# Patient Record
Sex: Female | Born: 1935
Health system: Southern US, Community
[De-identification: ages and names within clinical notes are randomized; demographics above are authoritative.]

## PROBLEM LIST (undated history)

## (undated) DIAGNOSIS — H9190 Unspecified hearing loss, unspecified ear: Secondary | ICD-10-CM

## (undated) DIAGNOSIS — K760 Fatty (change of) liver, not elsewhere classified: Secondary | ICD-10-CM

## (undated) DIAGNOSIS — E119 Type 2 diabetes mellitus without complications: Secondary | ICD-10-CM

## (undated) DIAGNOSIS — E785 Hyperlipidemia, unspecified: Secondary | ICD-10-CM

## (undated) DIAGNOSIS — I10 Essential (primary) hypertension: Secondary | ICD-10-CM

## (undated) HISTORY — DX: Essential (primary) hypertension: I10

## (undated) HISTORY — DX: Type 2 diabetes mellitus without complications: E11.9

## (undated) HISTORY — DX: Fatty (change of) liver, not elsewhere classified: K76.0

## (undated) HISTORY — PX: APPENDECTOMY: SHX54

## (undated) HISTORY — DX: Hyperlipidemia, unspecified: E78.5

## (undated) HISTORY — PX: ABDOMINAL HYSTERECTOMY: SHX81

## (undated) HISTORY — PX: COLONOSCOPY: SHX174

---

## 2006-01-27 ENCOUNTER — Encounter (INDEPENDENT_AMBULATORY_CARE_PROVIDER_SITE_OTHER): Payer: Self-pay | Admitting: Internal Medicine

## 2006-01-27 LAB — CONVERTED CEMR LAB
ALT: 20 units/L
AST: 19 units/L
Albumin: 4.6 g/dL
Alkaline Phosphatase: 88 units/L
BUN: 20 mg/dL
Basophils Absolute: 0 10*3/uL
Basophils Relative: 0 %
Bilirubin, Direct: 0.1 mg/dL
CO2: 24 meq/L
Calcium: 9.6 mg/dL
Chloride: 102 meq/L
Cholesterol: 233 mg/dL
Creatinine, Ser: 1 mg/dL
Eosinophils Absolute: 0.2 10*3/uL
Eosinophils Relative: 2 %
Glucose, Bld: 100 mg/dL
HCT: 40.5 %
HDL: 51 mg/dL
Hemoglobin: 13.7 g/dL
LDL Cholesterol: 146 mg/dL
Lymphocytes Relative: 35 %
Lymphs Abs: 3.6 10*3/uL
MCHC: 33.8 g/dL
MCV: 93 fL
Monocytes Absolute: 0.8 10*3/uL
Monocytes Relative: 8 %
Neutro Abs: 5.7 10*3/uL
Neutrophils Relative %: 55 %
Platelets: 261 10*3/uL
Potassium: 4.8 meq/L
RBC: 4.38 M/uL
RDW: 12.7 %
Sodium: 139 meq/L
Total Bilirubin: 0.4 mg/dL
Total Protein: 7.3 g/dL
Triglycerides: 178 mg/dL
VLDL: 36 mg/dL
WBC: 10.3 10*3/uL

## 2006-02-22 ENCOUNTER — Ambulatory Visit: Payer: Self-pay | Admitting: Internal Medicine

## 2006-02-23 ENCOUNTER — Encounter (INDEPENDENT_AMBULATORY_CARE_PROVIDER_SITE_OTHER): Payer: Self-pay | Admitting: Internal Medicine

## 2006-02-23 LAB — CONVERTED CEMR LAB: TSH: 2.634 microintl units/mL

## 2006-06-02 ENCOUNTER — Ambulatory Visit: Payer: Self-pay | Admitting: Internal Medicine

## 2006-08-15 ENCOUNTER — Ambulatory Visit: Payer: Self-pay | Admitting: Internal Medicine

## 2006-08-16 ENCOUNTER — Encounter (INDEPENDENT_AMBULATORY_CARE_PROVIDER_SITE_OTHER): Payer: Self-pay | Admitting: Internal Medicine

## 2006-08-26 ENCOUNTER — Encounter: Payer: Self-pay | Admitting: Internal Medicine

## 2006-08-26 DIAGNOSIS — I10 Essential (primary) hypertension: Secondary | ICD-10-CM | POA: Insufficient documentation

## 2006-08-26 DIAGNOSIS — F329 Major depressive disorder, single episode, unspecified: Secondary | ICD-10-CM | POA: Insufficient documentation

## 2006-08-26 DIAGNOSIS — M5430 Sciatica, unspecified side: Secondary | ICD-10-CM | POA: Insufficient documentation

## 2006-08-26 DIAGNOSIS — E1169 Type 2 diabetes mellitus with other specified complication: Secondary | ICD-10-CM | POA: Insufficient documentation

## 2006-08-26 DIAGNOSIS — H269 Unspecified cataract: Secondary | ICD-10-CM | POA: Insufficient documentation

## 2006-08-26 DIAGNOSIS — G43909 Migraine, unspecified, not intractable, without status migrainosus: Secondary | ICD-10-CM | POA: Insufficient documentation

## 2006-08-26 DIAGNOSIS — E785 Hyperlipidemia, unspecified: Secondary | ICD-10-CM | POA: Insufficient documentation

## 2006-09-12 ENCOUNTER — Ambulatory Visit (HOSPITAL_COMMUNITY): Admission: RE | Admit: 2006-09-12 | Discharge: 2006-09-12 | Payer: Self-pay | Admitting: Ophthalmology

## 2006-11-24 ENCOUNTER — Encounter (INDEPENDENT_AMBULATORY_CARE_PROVIDER_SITE_OTHER): Payer: Self-pay | Admitting: Internal Medicine

## 2006-11-25 ENCOUNTER — Ambulatory Visit: Payer: Self-pay | Admitting: Internal Medicine

## 2007-06-05 ENCOUNTER — Ambulatory Visit: Payer: Self-pay | Admitting: Internal Medicine

## 2007-06-08 ENCOUNTER — Encounter (INDEPENDENT_AMBULATORY_CARE_PROVIDER_SITE_OTHER): Payer: Self-pay | Admitting: Internal Medicine

## 2007-06-09 ENCOUNTER — Encounter (INDEPENDENT_AMBULATORY_CARE_PROVIDER_SITE_OTHER): Payer: Self-pay | Admitting: Internal Medicine

## 2007-06-12 LAB — CONVERTED CEMR LAB
BUN: 19 mg/dL (ref 6–23)
CO2: 29 meq/L (ref 19–32)
Calcium: 9 mg/dL (ref 8.4–10.5)
Chloride: 105 meq/L (ref 96–112)
Cholesterol: 199 mg/dL (ref 0–200)
Creatinine, Ser: 0.99 mg/dL (ref 0.40–1.20)
Glucose, Bld: 124 mg/dL — ABNORMAL HIGH (ref 70–99)
HDL: 47 mg/dL (ref >39)
LDL Cholesterol: 123 mg/dL — ABNORMAL HIGH (ref 0–99)
Potassium: 5.2 meq/L (ref 3.5–5.3)
Sodium: 143 meq/L (ref 135–145)
Total CHOL/HDL Ratio: 4.2
Triglycerides: 143 mg/dL (ref <150)
VLDL: 29 mg/dL (ref 0–40)

## 2007-06-13 ENCOUNTER — Telehealth (INDEPENDENT_AMBULATORY_CARE_PROVIDER_SITE_OTHER): Payer: Self-pay | Admitting: *Deleted

## 2007-06-20 ENCOUNTER — Ambulatory Visit (HOSPITAL_COMMUNITY): Admission: RE | Admit: 2007-06-20 | Discharge: 2007-06-20 | Payer: Self-pay | Admitting: Ophthalmology

## 2007-09-14 ENCOUNTER — Ambulatory Visit: Payer: Self-pay | Admitting: Internal Medicine

## 2007-09-14 DIAGNOSIS — G4700 Insomnia, unspecified: Secondary | ICD-10-CM

## 2007-09-14 DIAGNOSIS — F5104 Psychophysiologic insomnia: Secondary | ICD-10-CM | POA: Insufficient documentation

## 2007-09-14 DIAGNOSIS — E739 Lactose intolerance, unspecified: Secondary | ICD-10-CM | POA: Insufficient documentation

## 2007-09-14 DIAGNOSIS — R5381 Other malaise: Secondary | ICD-10-CM | POA: Insufficient documentation

## 2007-09-14 DIAGNOSIS — R5383 Other fatigue: Secondary | ICD-10-CM

## 2007-10-27 ENCOUNTER — Telehealth (INDEPENDENT_AMBULATORY_CARE_PROVIDER_SITE_OTHER): Payer: Self-pay | Admitting: *Deleted

## 2008-01-22 ENCOUNTER — Encounter (INDEPENDENT_AMBULATORY_CARE_PROVIDER_SITE_OTHER): Payer: Self-pay | Admitting: Internal Medicine

## 2008-03-29 ENCOUNTER — Ambulatory Visit: Payer: Self-pay | Admitting: Internal Medicine

## 2008-03-29 DIAGNOSIS — M1990 Unspecified osteoarthritis, unspecified site: Secondary | ICD-10-CM | POA: Insufficient documentation

## 2008-04-01 LAB — CONVERTED CEMR LAB
ALT: 27 units/L (ref 0–35)
AST: 20 units/L (ref 0–37)
Albumin: 4.2 g/dL (ref 3.5–5.2)
Alkaline Phosphatase: 75 units/L (ref 39–117)
BUN: 21 mg/dL (ref 6–23)
Basophils Absolute: 0 10*3/uL (ref 0.0–0.1)
Basophils Relative: 0 % (ref 0–1)
CO2: 27 meq/L (ref 19–32)
CRP: 0.2 mg/dL (ref <0.6)
Calcium: 9.6 mg/dL (ref 8.4–10.5)
Chloride: 104 meq/L (ref 96–112)
Cholesterol: 206 mg/dL — ABNORMAL HIGH (ref 0–200)
Creatinine, Ser: 0.95 mg/dL (ref 0.40–1.20)
Eosinophils Absolute: 0.2 10*3/uL (ref 0.0–0.7)
Eosinophils Relative: 2 % (ref 0–5)
Glucose, Bld: 112 mg/dL — ABNORMAL HIGH (ref 70–99)
HCT: 42.7 % (ref 36.0–46.0)
HDL: 46 mg/dL (ref >39)
Hemoglobin: 13.8 g/dL (ref 12.0–15.0)
LDL Cholesterol: 124 mg/dL — ABNORMAL HIGH (ref 0–99)
Lymphocytes Relative: 30 % (ref 12–46)
Lymphs Abs: 3 10*3/uL (ref 0.7–4.0)
MCHC: 32.3 g/dL (ref 30.0–36.0)
MCV: 97.9 fL (ref 78.0–100.0)
Monocytes Absolute: 0.9 10*3/uL (ref 0.1–1.0)
Monocytes Relative: 9 % (ref 3–12)
Neutro Abs: 6 10*3/uL (ref 1.7–7.7)
Neutrophils Relative %: 59 % (ref 43–77)
Platelets: 242 10*3/uL (ref 150–400)
Potassium: 5.1 meq/L (ref 3.5–5.3)
RBC: 4.36 M/uL (ref 3.87–5.11)
RDW: 12.9 % (ref 11.5–15.5)
Sodium: 140 meq/L (ref 135–145)
Total Bilirubin: 0.3 mg/dL (ref 0.3–1.2)
Total CHOL/HDL Ratio: 4.5
Total Protein: 7 g/dL (ref 6.0–8.3)
Triglycerides: 179 mg/dL — ABNORMAL HIGH (ref <150)
VLDL: 36 mg/dL (ref 0–40)
WBC: 10.2 10*3/uL (ref 4.0–10.5)

## 2008-05-23 ENCOUNTER — Ambulatory Visit: Payer: Self-pay | Admitting: Internal Medicine

## 2008-05-31 ENCOUNTER — Telehealth (INDEPENDENT_AMBULATORY_CARE_PROVIDER_SITE_OTHER): Payer: Self-pay | Admitting: Internal Medicine

## 2008-08-15 ENCOUNTER — Ambulatory Visit: Payer: Self-pay | Admitting: Internal Medicine

## 2008-08-28 ENCOUNTER — Telehealth (INDEPENDENT_AMBULATORY_CARE_PROVIDER_SITE_OTHER): Payer: Self-pay | Admitting: *Deleted

## 2008-12-11 ENCOUNTER — Encounter (INDEPENDENT_AMBULATORY_CARE_PROVIDER_SITE_OTHER): Payer: Self-pay | Admitting: Internal Medicine

## 2009-02-17 ENCOUNTER — Ambulatory Visit: Payer: Self-pay | Admitting: Internal Medicine

## 2009-02-17 DIAGNOSIS — T148XXA Other injury of unspecified body region, initial encounter: Secondary | ICD-10-CM | POA: Insufficient documentation

## 2009-02-17 DIAGNOSIS — M810 Age-related osteoporosis without current pathological fracture: Secondary | ICD-10-CM | POA: Insufficient documentation

## 2009-02-19 ENCOUNTER — Ambulatory Visit (HOSPITAL_COMMUNITY): Admission: RE | Admit: 2009-02-19 | Discharge: 2009-02-19 | Payer: Self-pay | Admitting: Internal Medicine

## 2009-02-19 ENCOUNTER — Encounter (INDEPENDENT_AMBULATORY_CARE_PROVIDER_SITE_OTHER): Payer: Self-pay | Admitting: Internal Medicine

## 2009-02-26 ENCOUNTER — Encounter (INDEPENDENT_AMBULATORY_CARE_PROVIDER_SITE_OTHER): Payer: Self-pay | Admitting: Internal Medicine

## 2009-02-27 ENCOUNTER — Encounter (INDEPENDENT_AMBULATORY_CARE_PROVIDER_SITE_OTHER): Payer: Self-pay | Admitting: Internal Medicine

## 2009-02-27 DIAGNOSIS — R7301 Impaired fasting glucose: Secondary | ICD-10-CM | POA: Insufficient documentation

## 2009-02-27 LAB — CONVERTED CEMR LAB
ALT: 39 units/L — ABNORMAL HIGH (ref 0–35)
AST: 28 units/L (ref 0–37)
Albumin: 4 g/dL (ref 3.5–5.2)
Alkaline Phosphatase: 76 units/L (ref 39–117)
BUN: 18 mg/dL (ref 6–23)
CO2: 26 meq/L (ref 19–32)
Calcium: 9.3 mg/dL (ref 8.4–10.5)
Chloride: 105 meq/L (ref 96–112)
Cholesterol: 207 mg/dL — ABNORMAL HIGH (ref 0–200)
Creatinine, Ser: 1.09 mg/dL (ref 0.40–1.20)
Glucose, Bld: 135 mg/dL — ABNORMAL HIGH (ref 70–99)
HDL: 44 mg/dL (ref >39)
LDL Cholesterol: 129 mg/dL — ABNORMAL HIGH (ref 0–99)
Potassium: 5 meq/L (ref 3.5–5.3)
Sodium: 143 meq/L (ref 135–145)
Total Bilirubin: 0.5 mg/dL (ref 0.3–1.2)
Total CHOL/HDL Ratio: 4.7
Total Protein: 6.7 g/dL (ref 6.0–8.3)
Triglycerides: 168 mg/dL — ABNORMAL HIGH (ref <150)
VLDL: 34 mg/dL (ref 0–40)

## 2009-03-05 ENCOUNTER — Telehealth (INDEPENDENT_AMBULATORY_CARE_PROVIDER_SITE_OTHER): Payer: Self-pay | Admitting: Internal Medicine

## 2009-04-17 ENCOUNTER — Encounter (INDEPENDENT_AMBULATORY_CARE_PROVIDER_SITE_OTHER): Payer: Self-pay | Admitting: Internal Medicine

## 2010-08-30 ENCOUNTER — Encounter: Payer: Self-pay | Admitting: Internal Medicine

## 2010-09-08 NOTE — Letter (Signed)
Summary: New Patient Screening  New Patient Screening   Imported By: Micah Flesher, LPN 14/78/2956 21:30:86  _____________________________________________________________________  External Attachment:    Type:   Image     Comment:   External Document

## 2010-09-08 NOTE — Assessment & Plan Note (Signed)
Summary: eustacian tube dysfunction/cerumen impaction   Vital Signs:  Patient Profile:   75 Years Old Female Height:     68 inches (172.72 cm) O2 Sat:      98 % Pulse rate:   72 / minute Resp:     8 per minute BP sitting:   136 / 88  (left arm) BP standing:   100 / 64  Pt. in pain?   no  Vitals Entered By: Lutricia Horsfall (November 25, 2006 2:26 PM) Oxygen therapy Room Air                Serial Vital Signs/Assessments:  Time      Position  BP       Pulse  Resp  Temp     By 2:34 PM   Lying RA  114/74                         Lutricia Horsfall 2:34 PM   Sitting   108/82                         Lutricia Horsfall 2:34 PM   Standing  100/64                         Lutricia Horsfall   Chief Complaint:  dizziness.  History of Present Illness: room spinning and lying down and turning head.  For about 3-4 weeks.  No nasal congestion.  +post nasal drip.  No substantial trouble with allergies in past.  Prior Medications: ACCUPRIL 20 MG TABS (QUINAPRIL HCL) two times a day ASPIR-LOW 81 MG TBEC (ASPIRIN) once daily AMBIEN 5 MG TABS (ZOLPIDEM TARTRATE) at bedtime CALCARB 600 1500 MG TABS (CALCIUM CARBONATE) two times a day Current Allergies: No known allergies   Past Medical History:    Reviewed history from 11/24/2006 and no changes required:       Depression       Hyperlipidemia       Hypertension       cataracts       glucose intolerance       migraines       sciatica  Past Surgical History:    Caesarean section    Hysterectomy    L cataract     Review of Systems      See HPI   Physical Exam  General:     Obese, in no acute distress. Alert and oriented X 3.  Ears:     R cerumen impaction and L Cerumen impaction.   Nose:     mild Erythema and swelling with clear drainage.  Mouth:     No eyrthema or exudate.  Neck:     normal carotid upstroke and no carotid bruits.   Lungs:     Clear to auscultation bilaterally with good air movement, normal expansion.   Heart:     Normal S1 and S2. No murmurs or extracardiac sounds. PMI is nondisplaced.  Bilat ears flushed using 50% hydrogen peroxide and 50% warm water in elephant ear wash systemn and ear spatula.  Pt. tolerated procedure well. .................................................................Marland KitchenMarland KitchenLutricia Horsfall  November 25, 2006 3:36 PM    Impression & Recommendations:  Problem # 1:  EUSTACHIAN TUBE DYSFUNCTION (ICD-381.81) WIll try sudal 12 two times a day and see if this helps.  This may be a contributing factor to her dizzines.  Problem # 2:  CERUMEN  IMPACTION, BILATERAL (ICD-380.4) room in impaction may be contributing to her dizziness and her ears were flushed and the cerumen was removed with some difficulty. Orders: Cerumen Impaction Removal ([id number])   Medications Added to Medication List This Visit: 1)  Sudal 12 Tannate 4-30 Mg Chew (Chlorphen mal-pse hcl tannate) .Marland Kitchen.. 1 by mouth two times a day   Patient Instructions: 1)  she will return in two weeks if her dizziness had not has not improved after her.  We flushed her ears and started the Sudal 12.

## 2010-09-08 NOTE — Assessment & Plan Note (Signed)
Summary: HTN/lipids/HCM/Td shot   Vital Signs:  Patient profile:   75 year old female Weight:      247.25 pounds BMI:     37.73 O2 Sat:      96 % on Room air Pulse rate:   96 / minute BP sitting:   120 / 88  (right arm)  Vitals Entered By: Micah Flesher, LPN (February 17, 2009 9:15 AM)  Nutrition Counseling: Patient's BMI is greater than 25 and therefore counseled on weight management options. CC: F/U, cut, needs tetanus   CC:  F/U, cut, and needs tetanus.  History of Present Illness: Patient presents today for routine follow up.  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  She doesn't remember when her last tetanus shot was because it was so long ago.    Her last mammogram was in 2007.  She says she had a colonoscopy about 5 years ago.  She has never had a bone density test.    She is asking about something to supress her appetite and help her lose weight.  She says she is eating at night.  She says her husband goes to bed very early and she just eats.  Allergies: No Known Drug Allergies  Past History:  Past Medical History: Reviewed history from 03/29/2008 and no changes required. Depression Hyperlipidemia Hypertension cataracts glucose intolerance migraines sciatica Osteoarthritis  Past Surgical History: Reviewed history from 11/25/2006 and no changes required. Caesarean section Hysterectomy L cataract  Family History: Reviewed history from 11/24/2006 and no changes required. father-deceased-CVA mother-deceased-lung cancer sister-hyperlipidemia, anxiety brother-deceased-GSW  Social History: Reviewed history from 11/24/2006 and no changes required. Married lives with spouse Never Smoked Alcohol use-no Drug use-no  Review of Systems      See HPI  Physical Exam  General:  Obese, in no acute distress. Alert and oriented X 3.  Lungs:  Clear to  auscultation bilaterally with good air movement, normal expansion.  Heart:  Normal S1 and S2. No murmurs or extracardiac sounds. PMI is nondisplaced.  Extremities:  trace pretibial edema   Impression & Recommendations:  Problem # 1:  HYPERTENSION (ICD-401.9) The patient's blood pressure looks good at this time.  Labs are needed.  Patient should monitor blood pressure occassionally until next appointment.  Her updated medication list for this problem includes:    Accupril 20 Mg Tabs (Quinapril hcl) .Marland Kitchen..Marland Kitchen Two times a day  Orders: T-Comprehensive Metabolic Panel ([id number])  Problem # 2:  HYPERLIPIDEMIA (ICD-272.4) She will return fasting for blood work at her convenience.   Orders: T-Comprehensive Metabolic Panel [id number]) T-Lipid Profile [phone])  Problem # 3:  LACERATION (ICD-879.8)  No infection, Td given.  Orders: Tetanus Toxoid w/Dx ([id number])  Problem # 4:  UNSPECIFIED BREAST SCREENING (ICD-V76.10)  Will refer for mammography.  Orders: Mammogram (Screening) (Mammo)  Complete Medication List: 1)  Accupril 20 Mg Tabs (Quinapril hcl) .... Two times a day 2)  Aspir-low 81 Mg Tbec (Aspirin) .... Once daily 3)  Amitriptyline Hcl 50 Mg Tabs (Amitriptyline hcl) .Marland Kitchen.. 1 by mouth 1-2 hours before bed as needed sleep 4)  Calcarb 600 1500 Mg Tabs (Calcium carbonate) .... Two times a day  Other Orders: Dexa scan (Dexa scan) Admin 1st Vaccine ([id number]) Prescriptions: AMITRIPTYLINE HCL 50 MG TABS (AMITRIPTYLINE HCL) 1 by mouth 1-2 hours before bed as needed sleep  #90 x 1  Entered and Authorized by:   Erle Crocker MD   Signed by:   Erle Crocker MD on 02/17/2009   Method used:   Electronically to        [facility] (mail-order)             ,          Ph: [phone]       Fax: [phone]   RxID:   [id number] ACCUPRIL 20 MG TABS (QUINAPRIL HCL) two times a day  #180 x 1   Entered and Authorized by:   Erle Crocker MD   Signed by:   Erle Crocker MD  on 02/17/2009   Method used:   Electronically to        [facility]* (mail-order)             ,          Ph: [phone]       Fax: [phone]   RxID:   [id number]    Preventive Care Screening  Colonoscopy:    Date:  02/07/2004    Next Due:  02/2014    Results:  normal    Tetanus/Td Vaccine    Vaccine Type: Td    Site: right deltoid    Mfr: Akorn    Dose: 0.5 ml    Route: IM    Given by: Micah Flesher, LPN    Exp. Date: 03/2009    Lot #: [id number]    VIS given: 06/27/07 version given February 17, 2009.   Appended Document: HTN/lipids/HCM/Td shot mammogram appt scheduled on 02/19/09 @10 :00am at [facility] and dexa scan 11:00am at [facility]. patient informed and order faxed.LA

## 2010-09-08 NOTE — Medication Information (Signed)
Summary: quinapril  quinapril   Imported By: Curtis Sites 12/13/2008 10:20:20  _____________________________________________________________________  External Attachment:    Type:   Image     Comment:   External Document

## 2010-09-08 NOTE — Letter (Signed)
Summary: Labs  Labs   Imported By: Micah Flesher, LPN 29/56/2130 86:57:84  _____________________________________________________________________  External Attachment:    Type:   Image     Comment:   External Document

## 2010-09-08 NOTE — Letter (Signed)
Summary: Dr. Consent  Dr. Consent   Imported By: Donneta Romberg 06/08/2007 14:04:57  _____________________________________________________________________  External Attachment:    Type:   Image     Comment:   External Document

## 2010-09-08 NOTE — Assessment & Plan Note (Signed)
Summary: lipids/HTN/left leg pain/fatigue/insomnia/   Vital Signs:  Patient Profile:   75 Years Old Female Height:     68 inches (172.72 cm) O2 Sat:      98 % O2 treatment:    Room Air Pulse rate:   81 / minute Resp:     12 per minute BP sitting:   116 / 86  (left arm)  Vitals Entered By: Lutricia Horsfall (September 14, 2007 9:28 AM)                 Chief Complaint:  followup.  History of Present Illness: Here for routine follow up.  She says she is most concerned about pain in her left leg that has been going on since at least December.  She says her 3 grandsons were here in December.  She says it was very severe in December but has gotten better.  She says she notes it now when she has been very active.  She notes it most of the time at night when she goes to bed at night.  She says the tylenol helps.  She is worried about blood clots.    She says she "does not accept the fatigue is from getting old."  She says she will not have another sleep study to evaluate this.  She says she couldn't sleep and it was too noisy and the sheets were cheap,...  She also says she got a very large bruise after having her blood drawn in November.    She is also asking about an article in the [employer] about CRP testing.  She says she is "doing OK" trying to watch her sugar intake.    She says she is using the [place] only as needed now.  She drings herb tea and takes a [place] herb before bed that helps her sleep most nights.    Current Allergies: No known allergies   Past Medical History:    Reviewed history from 11/24/2006 and no changes required:       Depression       Hyperlipidemia       Hypertension       cataracts       glucose intolerance       migraines       sciatica  Past Surgical History:    Reviewed history from 11/25/2006 and no changes required:       Caesarean section       Hysterectomy       L cataract   Family History:    Reviewed history from 11/24/2006 and no  changes required:       father-deceased-CVA       mother-deceased-lung cancer       sister-hyperlipidemia, anxiety       brother-deceased-GSW  Social History:    Reviewed history from 11/24/2006 and no changes required:       Married lives with spouse       Never Smoked       Alcohol use-no       Drug use-no    Review of Systems      See HPI   Physical Exam  General:     Obese, in no acute distress. Alert and oriented X 3.  Msk:     normal ROM both knees.  There is tenderness to palpation of the lateral joint line of the left knee. Extremities:     No edema, calf tenderness or swelling.     Impression &  Recommendations:  Problem # 1:  HYPERLIPIDEMIA (ICD-272.4) We discussed the [place] trial results and she would not want to take a statin if her CRP is high, so we are opting to not check the CRP.    Problem # 2:  HYPERTENSION (ICD-401.9) The patient's blood pressure looks good at this time.  Labs are not needed until her next appointment.  Patient should monitor blood pressure occassionally until next appointment.  Accupril is refilled today.  Her updated medication list for this problem includes:    Accupril 20 Mg Tabs (Quinapril hcl) .Marland Kitchen..Marland Kitchen Two times a day   Problem # 3:  LEG PAIN, LEFT (ICD-729.5) Her pain appears to be from OA.  We discussed possibly getting an x-ray but she says the tylenol is enough and she will continue with that.  Problem # 4:  FATIGUE (ICD-780.79) I certainly think she would benefit from a sleep study, but she refuses this.    Problem # 5:  INSOMNIA (ICD-780.52) ambien refilled for as needed use.   Her updated medication list for this problem includes:    Ambien 5 Mg Tabs (Zolpidem tartrate) .Marland Kitchen... At bedtime as needed   Problem # 6:  GLUCOSE INTOLERANCE (ICD-271.3) I tried to reinforce to her the importance of diet and exercise in helping normalize her sugars.  Complete Medication List: 1)  Accupril 20 Mg Tabs (Quinapril hcl) ....  Two times a day 2)  Aspir-low 81 Mg Tbec (Aspirin) .... Once daily 3)  Ambien 5 Mg Tabs (Zolpidem tartrate) .... At bedtime as needed 4)  Calcarb 600 1500 Mg Tabs (Calcium carbonate) .... Two times a day 5)  Sudal 12 Tannate 4-30 Mg Chew (Chlorphen mal-pse hcl tannate) .Marland Kitchen.. 1 by mouth two times a day   Patient Instructions: 1)  Please schedule a follow-up appointment in 3 months.    Prescriptions: AMBIEN 5 MG TABS (ZOLPIDEM TARTRATE) at bedtime  #30 x 2   Entered and Authorized by:   Erle Crocker MD   Signed by:   Erle Crocker MD on 09/14/2007   Method used:   Print then Give to Patient   RxID:   [id number] ACCUPRIL 20 MG TABS (QUINAPRIL HCL) two times a day  #30 x 5   Entered and Authorized by:   Erle Crocker MD   Signed by:   Erle Crocker MD on 09/14/2007   Method used:   Print then Give to Patient   RxID:   [id number]  ]

## 2010-09-08 NOTE — Letter (Signed)
Summary: Privacy document  Privacy document   Imported By: Micah Flesher, LPN 50/04/3817 29:93:71  _____________________________________________________________________  External Attachment:    Type:   Image     Comment:   External Document

## 2010-09-08 NOTE — Miscellaneous (Signed)
Summary: order for glucose  Clinical Lists Changes  Problems: Added new problem of IMPAIRED FASTING GLUCOSE (ICD-790.21) Orders: Added new Test order of T-Glucose, Blood [id number]) - Signed

## 2010-09-08 NOTE — Progress Notes (Signed)
Summary: Meds changed to [facility]  Phone Note Call from Patient   Summary of Call: patient states that [facility] should be faxing a request for all her medications. they are going to start using [facility]. if you would please go ahead and send in prescriptions. thanks Initial call taken by: Curtis Sites,  August 28, 2008 2:40 PM      Prescriptions: AMITRIPTYLINE HCL 50 MG TABS (AMITRIPTYLINE HCL) 1 by mouth 1-2 hours before bed as needed sleep  #90 x 1   Entered by:   Sherrie Geophysicist/field seismologist   Authorized by:   Erle Crocker MD   Signed by:   Lutricia Horsfall on 08/28/2008   Method used:   Printed then faxed to ...       [facility] (mail-order)             ,          Ph: [phone]       Fax: [phone]   RxID:   [id number] ACCUPRIL 20 MG TABS (QUINAPRIL HCL) two times a day  #180 x 1   Entered by:   Sherrie Geophysicist/field seismologist   Authorized by:   Erle Crocker MD   Signed by:   Lutricia Horsfall on 08/28/2008   Method used:   Printed then faxed to ...       [facility]* (mail-order)             ,          Ph: [phone]       Fax: [phone]   RxID:   [id number]

## 2010-09-08 NOTE — Letter (Signed)
Summary: Flow Sheet  Flow Sheet   Imported By: Micah Flesher, LPN 04/54/0981 19:14:78  _____________________________________________________________________  External Attachment:    Type:   Image     Comment:   External Document

## 2010-09-08 NOTE — Letter (Signed)
Summary: Mammography  Mammography   Imported By: Micah Flesher, LPN 98/06/9146 82:95:62  _____________________________________________________________________  External Attachment:    Type:   Image     Comment:   External Document

## 2010-09-08 NOTE — Letter (Signed)
Summary: External Document  External Document   Imported By: Micah Flesher, LPN 04/54/0981 19:14:78  _____________________________________________________________________  External Attachment:    Type:   Image     Comment:   External Document

## 2010-09-08 NOTE — Progress Notes (Signed)
Summary: Sleep Medicine  Phone Note Call from Patient   Summary of Call: Pt states that she has tried tha trazodone x3 since her appt in August and that each time she uses it she gets restless legs and would like something else called in for sleep.  She uses [facility]. Initial call taken by: Lutricia Horsfall,  May 31, 2008 10:29 AM  Follow-up for Phone Call        Amitriptylline 50mg  1 by mouth 1-2 hours before bed as needed.  #30, RF 2.  Rx sent to [facility]. Follow-up by: Erle Crocker MD,  May 31, 2008 1:01 PM  Additional Follow-up for Phone Call Additional follow up Details #1::        Left message for patient. Additional Follow-up by: Lutricia Horsfall,  May 31, 2008 3:50 PM    New/Updated Medications: AMITRIPTYLINE HCL 50 MG TABS (AMITRIPTYLINE HCL) 1 by mouth 1-2 hours before bed as needed sleep   Prescriptions: AMITRIPTYLINE HCL 50 MG TABS (AMITRIPTYLINE HCL) 1 by mouth 1-2 hours before bed as needed sleep  #30 x 2   Entered and Authorized by:   Erle Crocker MD   Signed by:   Erle Crocker MD on 05/31/2008   Method used:   Electronically to        [facility] (retail)       [address]       [place], [place]  [address]       Ph: [phone]       Fax: [phone]   RxID:   [id number]

## 2010-09-08 NOTE — Letter (Signed)
Summary: Office Visit  Office Visit   Imported By: Micah Flesher, LPN 95/63/8756 43:32:95  _____________________________________________________________________  External Attachment:    Type:   Image     Comment:   External Document

## 2010-09-08 NOTE — Assessment & Plan Note (Signed)
Summary: HTN/lipids/insomnia/OA/   Vital Signs:  Patient Profile:   75 Years Old Female Height:     68 inches (172.72 cm) O2 Sat:      96 % O2 treatment:    Room Air Pulse rate:   98 / minute Resp:     12 per minute BP sitting:   112 / 84  (left arm)  Vitals Entered By: Lutricia Horsfall (March 29, 2008 8:27 AM)                 Chief Complaint:  followup HTN.  History of Present Illness: Patient presents today for routine follow up.  She is asking for a CBC and CRP with her other labs today.  She says she is aching and it is accelerating.  SHe says she can't sit for more than 1 hour because her back hurts.  She says she can't sleep more than 4-5 hours per night because she wakes up with pain in her legs and back.  She denies exertional leg pain, chest pain or shortness of breath.  She says tylenol helps her pain but she doesn't want to take it because she wants her nightly glass of wine.  She says she needs Ambien 10mg .  Most nights she falls asleep with the 5mg  but she wakes up a few hours later.     She is also asking about vaccinations and booster shots.  She is uncertain if it has been 10 years since her tetanus shot but does have it documented at home.      Current Allergies: No known allergies   Past Medical History:    Reviewed history from 11/24/2006 and no changes required:       Depression       Hyperlipidemia       Hypertension       cataracts       glucose intolerance       migraines       sciatica       Osteoarthritis  Past Surgical History:    Reviewed history from 11/25/2006 and no changes required:       Caesarean section       Hysterectomy       L cataract   Family History:    Reviewed history from 11/24/2006 and no changes required:       father-deceased-CVA       mother-deceased-lung cancer       sister-hyperlipidemia, anxiety       brother-deceased-GSW  Social History:    Reviewed history from 11/24/2006 and no changes required:  Married lives with spouse       Never Smoked       Alcohol use-no       Drug use-no    Review of Systems      See HPI   Physical Exam  General:     Obese, in no acute distress. Alert and oriented X 3.  Lungs:     Clear to auscultation bilaterally with good air movement, normal expansion.  Heart:     Normal S1 and S2. No murmurs or extracardiac sounds. PMI is nondisplaced.  Extremities:     trace left pretibial edema and right pretibial edema.      Impression & Recommendations:  Problem # 1:  HYPERTENSION (ICD-401.9) The patient's blood pressure looks good at this time.  Labs are needed.  Patient should monitor blood pressure occassionally until next appointment.  Her updated medication list for this problem includes:  Accupril 20 Mg Tabs (Quinapril hcl) .Marland Kitchen..Marland Kitchen Two times a day  Orders: T-Comprehensive Metabolic Panel ([id number])   Problem # 2:  HYPERLIPIDEMIA (ICD-272.4) Lipids and liver enzymes are drawn today and adjustments will be made in medications based on the lab results.  Orders: T-Comprehensive Metabolic Panel [id number]) T-Lipid Profile ([id number])   Problem # 3:  INSOMNIA (ICD-780.52) I told her that Remus Loffler is just not a good choice for nightly use.  I also told her that I am uncomfortable with continuing to give her sedative medications without evaluating the underlying etiology especially with a sleep study but she is again unwilling to do that.  We are going to try trazodone 100mg  1 hour before bed.  Problem # 4:  FATIGUE (ICD-780.79) Suspect due to #3 but will check CBC. Orders: T-CBC w/Diff ([id number])   Problem # 5:  PHYSICAL EXAMINATION (ICD-V70.0) She is aware that Medicare might not pay for a CRP, but she wants it.  She is going to check when her last tetanus shot was but I told her Medicare does not pay for tetanus boosters.  I told her that flu shots will be available in Sept/Oct. Orders: T-C-Reactive Protein [id number])    Problem # 6:  OSTEOARTHRITIS (ICD-715.90) I told her that she can safely take tylenol 3-4 times per week and still have her glass of wine nightly. Her updated medication list for this problem includes:    Aspir-low 81 Mg Tbec (Aspirin) ..... Once daily   Complete Medication List: 1)  Accupril 20 Mg Tabs (Quinapril hcl) .... Two times a day 2)  Aspir-low 81 Mg Tbec (Aspirin) .... Once daily 3)  Trazodone Hcl 100 Mg Tabs (Trazodone hcl) .Marland Kitchen.. 1 by mouth 1 hour before bed 4)  Calcarb 600 1500 Mg Tabs (Calcium carbonate) .... Two times a day   Patient Instructions: 1)  The patient will be called with the results when they are available.   Prescriptions: TRAZODONE HCL 100 MG TABS (TRAZODONE HCL) 1 by mouth 1 hour before bed  #30 x 5   Entered and Authorized by:   Erle Crocker MD   Signed by:   Erle Crocker MD on 03/29/2008   Method used:   Electronically to        [facility] (retail)       [address]       [place], [place]  [address]       Ph: [phone]       Fax: [phone]   RxID:   [id number]  ]

## 2010-09-08 NOTE — Progress Notes (Signed)
Summary: 06/09/2007 lab results  Phone Note Outgoing Call   Call placed by: Sonny Dandy,  June 13, 2007 9:18 AM Summary of Call: Results given to patient, voices understanding .................................................................Marland KitchenMarland KitchenSonny Dandy  June 13, 2007 9:18 AM   Initial call taken by: Sonny Dandy,  June 13, 2007 9:18 AM

## 2010-09-08 NOTE — Letter (Signed)
Summary: Medical release  Medical release   Imported By: Micah Flesher, LPN 53/66/4403 47:42:59  _____________________________________________________________________  External Attachment:    Type:   Image     Comment:   External Document

## 2010-09-08 NOTE — Progress Notes (Signed)
Summary: REQUESTING DIET PILLS  Phone Note Call from Patient Call back at Home Phone [phone]   Caller: Patient Call For: Erle Crocker MD Summary of Call: patient said she requested some diet pills at her last visit but no response was given and would like to ask again.  Initial call taken by: Carlye Grippe,  March 05, 2009 12:04 PM  Follow-up for Phone Call        We did discuss the diet pills and I explained to her that they do not work if she doesn't first work on modifying her diet.  If she would like referral to the dietician, that would be the best first step. Follow-up by: Erle Crocker MD,  March 05, 2009 1:00 PM  Additional Follow-up for Phone Call Additional follow up Details #1::        patient informed. Additional Follow-up by: Carlye Grippe,  March 05, 2009 1:06 PM

## 2010-09-08 NOTE — Assessment & Plan Note (Signed)
Summary: H1N1 SHOT  Nurse Visit  H1N1 Given.  Deanna Salinas  August 15, 2008 1:49 PM   Prior Medications: ACCUPRIL 20 MG TABS (QUINAPRIL HCL) two times a day ASPIR-LOW 81 MG TBEC (ASPIRIN) once daily AMITRIPTYLINE HCL 50 MG TABS (AMITRIPTYLINE HCL) 1 by mouth 1-2 hours before bed as needed sleep CALCARB 600 1500 MG TABS (CALCIUM CARBONATE) two times a day Current Allergies: No known allergies     Orders Added: 1)  Influenza A (H1N1) adm  fee Medicare/Non Medicare Shadi.Imam    ]

## 2010-09-08 NOTE — Letter (Signed)
Summary: Mammogram  Mammogram   Imported By: Micah Flesher, LPN 60/45/4098 11:91:47  _____________________________________________________________________  External Attachment:    Type:   Image     Comment:   External Document

## 2010-09-08 NOTE — Letter (Signed)
Summary: Demographics  Demographics   Imported By: Micah Flesher, LPN 16/05/9603 54:09:81  _____________________________________________________________________  External Attachment:    Type:   Image     Comment:   External Document

## 2010-09-08 NOTE — Therapy (Signed)
Summary: ENTconsult  ENTconsult   Imported By: Donneta Romberg 09/23/2006 16:22:22  _____________________________________________________________________  External Attachment:    Type:   Image     Comment:   External Document

## 2010-09-08 NOTE — Medication Information (Signed)
Summary: amitriptyline  amitriptyline   Imported By: Curtis Sites 12/13/2008 10:09:26  _____________________________________________________________________  External Attachment:    Type:   Image     Comment:   External Document

## 2010-09-08 NOTE — Assessment & Plan Note (Signed)
Summary: HTN/lipids/skin tags/flu shot   Vital Signs:  Patient Profile:   75 Years Old Female Height:     68 inches (172.72 cm) Weight:      241 pounds Temp:     97.1 degrees F Pulse rate:   83 / minute Resp:     16 per minute BP sitting:   130 / 90  (right arm)  Pt. in pain?   no  Vitals Entered BySonny Dandy (June 05, 2007 8:55 AM)                  Procedure Note  Skin Tag Removal: Indication: inflamed lesion Consent signed: yes  Procedure # 1: skin tag(s) removed    Region: inferior    Location: left neck    # lesions removed: 3    Instrument used: scissors    Comment: after written informed consent was obtained, the area was cleaned with chlorprep in the usual sterile fashion.  2% lidocaine with epinephrine was used for local anesthetic.   3 skin tags were removed without difficulty.  The patient tolerated the procedure and there were no immediate complications.  Silver nitrate was used to obtain hemostasis on one of the sites.    Chief Complaint:  flu shot / skin tag removal.  History of Present Illness: Deanna Salinas presents today needing her flu shot.  She needs refills of her medication.  She is not fasting.  She has a few irritated skin tags on the left side of her neck that she would like to have removed.  They get caught on her neck line and necklaces.    Current Allergies: No known allergies   Past Medical History:    Reviewed history from 11/24/2006 and no changes required:       Depression       Hyperlipidemia       Hypertension       cataracts       glucose intolerance       migraines       sciatica  Past Surgical History:    Reviewed history from 11/25/2006 and no changes required:       Caesarean section       Hysterectomy       L cataract   Family History:    Reviewed history from 11/24/2006 and no changes required:       father-deceased-CVA       mother-deceased-lung cancer       sister-hyperlipidemia, anxiety  brother-deceased-GSW  Social History:    Reviewed history from 11/24/2006 and no changes required:       Married lives with spouse       Never Smoked       Alcohol use-no       Drug use-no    Review of Systems      See HPI   Physical Exam  General:     Obese, in no acute distress. Alert and oriented X 3.  Skin:     3 small skin tags on left side of neck.    Impression & Recommendations:  Problem # 1:  HYPERTENSION (ICD-401.9) The patient's blood pressure looks OK at this time.  Labs are needed.  Patient should monitor blood pressure occassionally until next appointment.  Her updated medication list for this problem includes:    Accupril 20 Mg Tabs (Quinapril hcl) .Marland Kitchen..Marland Kitchen Two times a day  Orders: T-Basic Metabolic Panel [phone])   Problem # 2:  HYPERLIPIDEMIA (ICD-272.4)  Lipids and liver enzymes are drawn today and we will need to discuss medications if her LDL remains elevated.  Orders: T-Lipid Profile ([id number])   Problem # 3:  SKIN TAG (ICD-701.9)  Orders: Removal of Skin Tags up to 15 Lesions (11200)   Complete Medication List: 1)  Accupril 20 Mg Tabs (Quinapril hcl) .... Two times a day 2)  Aspir-low 81 Mg Tbec (Aspirin) .... Once daily 3)  Ambien 5 Mg Tabs (Zolpidem tartrate) .... At bedtime 4)  Calcarb 600 1500 Mg Tabs (Calcium carbonate) .... Two times a day 5)  Sudal 12 Tannate 4-30 Mg Chew (Chlorphen mal-pse hcl tannate) .Marland Kitchen.. 1 by mouth two times a day  Other Orders: Influenza Vaccine MCR (09811)   Patient Instructions: 1)  The patient will be called with the results when they are available.    Prescriptions: AMBIEN 5 MG TABS (ZOLPIDEM TARTRATE) at bedtime  #30 x 2   Entered and Authorized by:   Erle Crocker MD   Signed by:   Erle Crocker MD on 06/05/2007   Method used:   Electronically sent to ...       [facility]       [address]       [place], [place]  [address]       Ph: [phone]        Fax: [phone]   RxID:   [id number] ACCUPRIL 20 MG TABS (QUINAPRIL HCL) two times a day  #30 x 5   Entered and Authorized by:   Erle Crocker MD   Signed by:   Erle Crocker MD on 06/05/2007   Method used:   Electronically sent to ...       [facility]       [address]       [place], [place]  [address]       Ph: [phone]       Fax: [phone]   RxID:   [id number]  ]  Influenza Vaccine    Vaccine Type: Fluvax MCR    Site: left deltoid    Mfr: norvaris    Dose: 0.5 ml    Route: IM    Given by: Sonny Dandy    Exp. Date: 12/2007    Lot #: [id number]    VIS given: 02/05/05 version given June 05, 2007.  Flu Vaccine Consent Questions    Do you have a history of severe allergic reactions to this vaccine? no    Any prior history of allergic reactions to egg and/or gelatin? no    Do you have a sensitivity to the preservative Thimersol? no    Do you have a past history of Guillan-Barre Syndrome? no    Do you currently have an acute febrile illness? no    Have you ever had a severe reaction to latex? no    Vaccine information given and explained to patient? yes    Are you currently pregnant? no

## 2011-02-03 ENCOUNTER — Ambulatory Visit (HOSPITAL_COMMUNITY)
Admission: RE | Admit: 2011-02-03 | Discharge: 2011-02-03 | Disposition: A | Discharge status: Home / Self Care | Payer: Medicare Other | Source: Ambulatory Visit | Attending: Family Medicine | Admitting: Family Medicine

## 2011-02-03 ENCOUNTER — Other Ambulatory Visit: Payer: Self-pay | Admitting: Family Medicine

## 2011-02-03 DIAGNOSIS — M51379 Other intervertebral disc degeneration, lumbosacral region without mention of lumbar back pain or lower extremity pain: Secondary | ICD-10-CM | POA: Insufficient documentation

## 2011-02-03 DIAGNOSIS — M5430 Sciatica, unspecified side: Secondary | ICD-10-CM

## 2011-02-03 DIAGNOSIS — M545 Low back pain, unspecified: Secondary | ICD-10-CM | POA: Insufficient documentation

## 2011-02-03 DIAGNOSIS — M5137 Other intervertebral disc degeneration, lumbosacral region: Secondary | ICD-10-CM | POA: Insufficient documentation

## 2011-03-05 ENCOUNTER — Other Ambulatory Visit: Payer: Self-pay | Admitting: Family Medicine

## 2011-03-11 ENCOUNTER — Ambulatory Visit (HOSPITAL_COMMUNITY)
Admission: RE | Admit: 2011-03-11 | Discharge: 2011-03-11 | Disposition: A | Discharge status: Home / Self Care | Payer: Medicare Other | Source: Ambulatory Visit | Attending: Family Medicine | Admitting: Family Medicine

## 2011-03-12 ENCOUNTER — Ambulatory Visit (HOSPITAL_COMMUNITY)
Admission: RE | Admit: 2011-03-12 | Discharge: 2011-03-12 | Disposition: A | Discharge status: Home / Self Care | Payer: Medicare Other | Source: Ambulatory Visit | Attending: Family Medicine | Admitting: Family Medicine

## 2011-03-12 DIAGNOSIS — Q619 Cystic kidney disease, unspecified: Secondary | ICD-10-CM | POA: Insufficient documentation

## 2011-03-12 DIAGNOSIS — R1031 Right lower quadrant pain: Secondary | ICD-10-CM | POA: Insufficient documentation

## 2011-11-22 DIAGNOSIS — Z79899 Other long term (current) drug therapy: Secondary | ICD-10-CM | POA: Diagnosis not present

## 2011-11-22 DIAGNOSIS — I10 Essential (primary) hypertension: Secondary | ICD-10-CM | POA: Diagnosis not present

## 2011-11-22 DIAGNOSIS — R7301 Impaired fasting glucose: Secondary | ICD-10-CM | POA: Diagnosis not present

## 2011-11-30 DIAGNOSIS — I10 Essential (primary) hypertension: Secondary | ICD-10-CM | POA: Diagnosis not present

## 2012-05-01 DIAGNOSIS — Z23 Encounter for immunization: Secondary | ICD-10-CM | POA: Diagnosis not present

## 2012-05-10 DIAGNOSIS — E119 Type 2 diabetes mellitus without complications: Secondary | ICD-10-CM | POA: Diagnosis not present

## 2012-05-10 DIAGNOSIS — Z79899 Other long term (current) drug therapy: Secondary | ICD-10-CM | POA: Diagnosis not present

## 2012-05-10 DIAGNOSIS — E782 Mixed hyperlipidemia: Secondary | ICD-10-CM | POA: Diagnosis not present

## 2012-05-15 DIAGNOSIS — Z23 Encounter for immunization: Secondary | ICD-10-CM | POA: Diagnosis not present

## 2012-05-15 DIAGNOSIS — E875 Hyperkalemia: Secondary | ICD-10-CM | POA: Diagnosis not present

## 2012-06-14 DIAGNOSIS — Z79899 Other long term (current) drug therapy: Secondary | ICD-10-CM | POA: Diagnosis not present

## 2012-06-27 DIAGNOSIS — E119 Type 2 diabetes mellitus without complications: Secondary | ICD-10-CM | POA: Diagnosis not present

## 2012-06-27 DIAGNOSIS — Z961 Presence of intraocular lens: Secondary | ICD-10-CM | POA: Diagnosis not present

## 2012-11-24 ENCOUNTER — Telehealth: Payer: Self-pay | Admitting: Family Medicine

## 2012-11-24 DIAGNOSIS — Z79899 Other long term (current) drug therapy: Secondary | ICD-10-CM

## 2012-11-24 DIAGNOSIS — E119 Type 2 diabetes mellitus without complications: Secondary | ICD-10-CM

## 2012-11-24 DIAGNOSIS — E782 Mixed hyperlipidemia: Secondary | ICD-10-CM

## 2012-11-24 NOTE — Telephone Encounter (Signed)
Lip liv a1c

## 2012-11-24 NOTE — Telephone Encounter (Signed)
Patient needs BW paperwork °

## 2012-11-24 NOTE — Telephone Encounter (Signed)
Blood work papers printed and left up front for patient pick up. Patient notified. 

## 2012-11-27 ENCOUNTER — Telehealth: Payer: Self-pay | Admitting: Family Medicine

## 2012-11-27 NOTE — Telephone Encounter (Signed)
Lorazepam 1 mg #30 one qhs with one additional refill called to [facility]. Pt notified and pt already has follow up appointment

## 2012-11-27 NOTE — Telephone Encounter (Signed)
May refill x2 total.she does need to schedule a standard office visit

## 2012-11-27 NOTE — Telephone Encounter (Signed)
Patient needs a refill of lorazepam to [facility]

## 2012-12-04 DIAGNOSIS — Z79899 Other long term (current) drug therapy: Secondary | ICD-10-CM | POA: Diagnosis not present

## 2012-12-04 DIAGNOSIS — E782 Mixed hyperlipidemia: Secondary | ICD-10-CM | POA: Diagnosis not present

## 2012-12-04 DIAGNOSIS — E119 Type 2 diabetes mellitus without complications: Secondary | ICD-10-CM | POA: Diagnosis not present

## 2012-12-04 LAB — HEMOGLOBIN A1C
Hgb A1c MFr Bld: 6.4 % — ABNORMAL HIGH (ref <5.7)
Mean Plasma Glucose: 137 mg/dL — ABNORMAL HIGH (ref <117)

## 2012-12-04 LAB — LIPID PANEL
Cholesterol: 232 mg/dL — ABNORMAL HIGH (ref 0–200)
HDL: 44 mg/dL (ref >39)
LDL Cholesterol: 154 mg/dL — ABNORMAL HIGH (ref 0–99)
Total CHOL/HDL Ratio: 5.3 Ratio
Triglycerides: 172 mg/dL — ABNORMAL HIGH (ref <150)
VLDL: 34 mg/dL (ref 0–40)

## 2012-12-04 LAB — HEPATIC FUNCTION PANEL
ALT: 32 U/L (ref 0–35)
AST: 19 U/L (ref 0–37)
Albumin: 3.9 g/dL (ref 3.5–5.2)
Alkaline Phosphatase: 77 U/L (ref 39–117)
Bilirubin, Direct: 0.1 mg/dL (ref 0.0–0.3)
Indirect Bilirubin: 0.3 mg/dL (ref 0.0–0.9)
Total Bilirubin: 0.4 mg/dL (ref 0.3–1.2)
Total Protein: 6.4 g/dL (ref 6.0–8.3)

## 2012-12-05 ENCOUNTER — Ambulatory Visit: Payer: Medicare Other | Admitting: Family Medicine

## 2012-12-11 ENCOUNTER — Encounter: Payer: Self-pay | Admitting: Family Medicine

## 2012-12-11 ENCOUNTER — Ambulatory Visit (INDEPENDENT_AMBULATORY_CARE_PROVIDER_SITE_OTHER): Payer: Medicare Other | Admitting: Family Medicine

## 2012-12-11 VITALS — BP 122/82 | HR 80 | Wt 238.2 lb

## 2012-12-11 DIAGNOSIS — R7301 Impaired fasting glucose: Secondary | ICD-10-CM | POA: Diagnosis not present

## 2012-12-11 DIAGNOSIS — I10 Essential (primary) hypertension: Secondary | ICD-10-CM

## 2012-12-11 DIAGNOSIS — E785 Hyperlipidemia, unspecified: Secondary | ICD-10-CM

## 2012-12-11 DIAGNOSIS — G4700 Insomnia, unspecified: Secondary | ICD-10-CM

## 2012-12-11 DIAGNOSIS — Z0000 Encounter for general adult medical examination without abnormal findings: Secondary | ICD-10-CM | POA: Diagnosis not present

## 2012-12-11 DIAGNOSIS — M1990 Unspecified osteoarthritis, unspecified site: Secondary | ICD-10-CM | POA: Diagnosis not present

## 2012-12-11 NOTE — Progress Notes (Signed)
  Subjective:    Patient ID: Deanna Salinas, female    DOB: Feb 16, 1936, 77 y.o.   MRN: [id number]  HPI #1 patient has history hypertension-she takes her medicine on regular basis denies any chest tightness pressure pain or shortness of breath with it. She knows she needs to do a better job with her diet and increased activity #2 impaired fasting glucose she does try to watch starch is to some degree although she could do a better job. Osteoarthritis-fair control. Uses Tylenol for discomfort Insomnia-she takes her medicine as directed seems to help to some degree. Hyperlipidemia she could do a better job on diet is trying to lose some weight. Past medical history family history social history all reviewed.   Review of SystemsROS see per above.     Objective:   Physical Exam Vital signs stable. Lungs are clear no crackles heart is regular pulse normal abdomen is soft extremities no edema skin warm dry neurologic grossly normal patient moderately overweight extremities trace edema       Assessment & Plan:  Routine general medical examination at a health care facility - Plan: MM Digital Screening  HYPERTENSION  Impaired fasting glucose  OSTEOARTHRITIS  INSOMNIA  HYPERLIPIDEMIA we talked at length about the importance of taking her medicines also talked at length about the importance for exercise and try to bring weight down I would like to see her back within 6 months to see how things are going sooner if any particular problems also should be noted that her breast exam is done today was normal mammogram was ordered. Her daughter was recently diagnosed with breast cancer had a double mastectomy.

## 2012-12-11 NOTE — Progress Notes (Signed)
Mammogram [facility] May 8th,2014 11:40. Pt aware

## 2012-12-11 NOTE — Patient Instructions (Signed)
Your cholesterol looks fair LDL is 154, HDL 44  A1C looks good at 6.4  Follow up in 6 months  Diabetes Meal Planning Guide The diabetes meal planning guide is a tool to help you plan your meals and snacks. It is important for people with diabetes to manage their blood glucose (sugar) levels. Choosing the right foods and the right amounts throughout your day will help control your blood glucose. Eating right can even help you improve your blood pressure and reach or maintain a healthy weight. CARBOHYDRATE COUNTING MADE EASY When you eat carbohydrates, they turn to sugar. This raises your blood glucose level. Counting carbohydrates can help you control this level so you feel better. When you plan your meals by counting carbohydrates, you can have more flexibility in what you eat and balance your medicine with your food intake. Carbohydrate counting simply means adding up the total amount of carbohydrate grams in your meals and snacks. Try to eat about the same amount at each meal. Foods with carbohydrates are listed below. Each portion below is 1 carbohydrate serving or 15 grams of carbohydrates. Ask your dietician how many grams of carbohydrates you should eat at each meal or snack. Grains and Starches  1 slice bread.   English muffin or hotdog/hamburger bun.   cup cold cereal (unsweetened).   cup cooked pasta or rice.   cup starchy vegetables (corn, potatoes, peas, beans, winter squash).  1 tortilla (6 inches).   bagel.  1 waffle or pancake (size of a CD).   cup cooked cereal.  4 to 6 small crackers. *Whole grain is recommended. Fruit  1 cup fresh unsweetened berries, melon, papaya, pineapple.  1 small fresh fruit.   banana or mango.   cup fruit juice (4 oz unsweetened).   cup canned fruit in natural juice or water.  2 tbs dried fruit.  12 to 15 grapes or cherries. Milk and Yogurt  1 cup fat-free or 1% milk.  1 cup soy milk.  6 oz light yogurt with  sugar-free sweetener.  6 oz low-fat soy yogurt.  6 oz plain yogurt. Vegetables  1 cup raw or  cup cooked is counted as 0 carbohydrates or a "free" food.  If you eat 3 or more servings at 1 meal, count them as 1 carbohydrate serving. Other Carbohydrates   oz chips or pretzels.   cup ice cream or frozen yogurt.   cup sherbet or sorbet.  2 inch square cake, no frosting.  1 tbs honey, sugar, jam, jelly, or syrup.  2 small cookies.  3 squares of graham crackers.  3 cups popcorn.  6 crackers.  1 cup broth-based soup.  Count 1 cup casserole or other mixed foods as 2 carbohydrate servings.  Foods with less than 20 calories in a serving may be counted as 0 carbohydrates or a "free" food. You may want to purchase a book or computer software that lists the carbohydrate gram counts of different foods. In addition, the nutrition facts panel on the labels of the foods you eat are a good source of this information. The label will tell you how big the serving size is and the total number of carbohydrate grams you will be eating per serving. Divide this number by 15 to obtain the number of carbohydrate servings in a portion. Remember, 1 carbohydrate serving equals 15 grams of carbohydrate. SERVING SIZES Measuring foods and serving sizes helps you make sure you are getting the right amount of food. The list below tells  how big or small some common serving sizes are.  1 oz.........4 stacked dice.  3 oz........Marland KitchenDeck of cards.  1 tsp.......Marland KitchenTip of little finger.  1 tbs......Marland KitchenMarland KitchenThumb.  2 tbs.......Marland KitchenGolf ball.   cup......Marland KitchenHalf of a fist.  1 cup.......Marland KitchenA fist. SAMPLE DIABETES MEAL PLAN Below is a sample meal plan that includes foods from the grain and starches, dairy, vegetable, fruit, and meat groups. A dietician can individualize a meal plan to fit your calorie needs and tell you the number of servings needed from each food group. However, controlling the total amount of  carbohydrates in your meal or snack is more important than making sure you include all of the food groups at every meal. You may interchange carbohydrate containing foods (dairy, starches, and fruits). The meal plan below is an example of a 2000 calorie diet using carbohydrate counting. This meal plan has 17 carbohydrate servings. Breakfast  1 cup oatmeal (2 carb servings).   cup light yogurt (1 carb serving).  1 cup blueberries (1 carb serving).   cup almonds. Snack  1 large apple (2 carb servings).  1 low-fat string cheese stick. Lunch  Chicken breast salad.  1 cup spinach.   cup chopped tomatoes.  2 oz chicken breast, sliced.  2 tbs low-fat [place] dressing.  12 whole-wheat crackers (2 carb servings).  12 to 15 grapes (1 carb serving).  1 cup low-fat milk (1 carb serving). Snack  1 cup carrots.   cup hummus (1 carb serving). Dinner  3 oz broiled salmon.  1 cup brown rice (3 carb servings). Snack  1  cups steamed broccoli (1 carb serving) drizzled with 1 tsp olive oil and lemon juice.  1 cup light pudding (2 carb servings). DIABETES MEAL PLANNING WORKSHEET Your dietician can use this worksheet to help you decide how many servings of foods and what types of foods are right for you.  BREAKFAST Food Group and Servings / Carb Servings Grain/Starches __________________________________ Dairy __________________________________________ Vegetable ______________________________________ Fruit ___________________________________________ Meat __________________________________________ Fat ____________________________________________ LUNCH Food Group and Servings / Carb Servings Grain/Starches ___________________________________ Dairy ___________________________________________ Fruit ____________________________________________ Meat ___________________________________________ Fat _____________________________________________ Laural Golden Food Group and Servings /  Carb Servings Grain/Starches ___________________________________ Dairy ___________________________________________ Fruit ____________________________________________ Meat ___________________________________________ Fat _____________________________________________ SNACKS Food Group and Servings / Carb Servings Grain/Starches ___________________________________ Dairy ___________________________________________ Vegetable _______________________________________ Fruit ____________________________________________ Meat ___________________________________________ Fat _____________________________________________ DAILY TOTALS Starches _________________________ Vegetable ________________________ Fruit ____________________________ Dairy ____________________________ Meat ____________________________ Fat ______________________________ Document Released: 04/22/2005 Document Revised: 10/18/2011 Document Reviewed: 03/03/2009 [employer] Patient Information 2013 [place], [place].   Cholesterol Cholesterol is a white, waxy, fat-like protein needed by your body in small amounts. The liver makes all the cholesterol you need. It is carried from the liver by the blood through the blood vessels. Deposits (plaque) may build up on blood vessel walls. This makes the arteries narrower and stiffer. Plaque increases the risk for heart attack and stroke. You cannot feel your cholesterol level even if it is very high. The only way to know is by a blood test to check your lipid (fats) levels. Once you know your cholesterol levels, you should keep a record of the test results. Work with your caregiver to to keep your levels in the desired range. WHAT THE RESULTS MEAN:  Total cholesterol is a rough measure of all the cholesterol in your blood.  LDL is the so-called bad cholesterol. This is the type that deposits cholesterol in the walls of the arteries. You want this level to be low.  HDL is the good cholesterol  because it cleans the arteries and carries the LDL away. You want this level to be high.  Triglycerides are fat that the body can either burn for energy or store. High levels are closely linked to heart disease. DESIRED LEVELS:  Total cholesterol below 200.  LDL below 100 for people at risk, below 70 for very high risk.  HDL above 50 is good, above 60 is best.  Triglycerides below 150. HOW TO LOWER YOUR CHOLESTEROL:  Diet.  Choose fish or white meat chicken and [place], roasted or baked. Limit fatty cuts of red meat, fried foods, and processed meats, such as sausage and lunch meat.  Eat lots of fresh fruits and vegetables. Choose whole grains, beans, pasta, potatoes and cereals.  Use only small amounts of olive, corn or canola oils. Avoid butter, mayonnaise, shortening or palm kernel oils. Avoid foods with trans-fats.  Use skim/nonfat milk and low-fat/nonfat yogurt and cheeses. Avoid whole milk, cream, ice cream, egg yolks and cheeses. Healthy desserts include angel food cake, ginger snaps, animal crackers, hard candy, popsicles, and low-fat/nonfat frozen yogurt. Avoid pastries, cakes, pies and cookies.  Exercise.  A regular program helps decrease LDL and raises HDL.  Helps with weight control.  Do things that increase your activity level like gardening, walking, or taking the stairs.  Medication.  May be prescribed by your caregiver to help lowering cholesterol and the risk for heart disease.  You may need medicine even if your levels are normal if you have several risk factors. HOME CARE INSTRUCTIONS   Follow your diet and exercise programs as suggested by your caregiver.  Take medications as directed.  Have blood work done when your caregiver feels it is necessary. MAKE SURE YOU:   Understand these instructions.  Will watch your condition.  Will get help right away if you are not doing well or get worse. Document Released: 04/20/2001 Document Revised: 10/18/2011  Document Reviewed: 10/11/2007 [facility] Patient Information 2013 [place], [place].

## 2012-12-14 ENCOUNTER — Ambulatory Visit (HOSPITAL_COMMUNITY)
Admission: RE | Admit: 2012-12-14 | Discharge: 2012-12-14 | Disposition: A | Discharge status: Home / Self Care | Payer: Medicare Other | Source: Ambulatory Visit | Attending: Family Medicine | Admitting: Family Medicine

## 2012-12-14 DIAGNOSIS — Z0000 Encounter for general adult medical examination without abnormal findings: Secondary | ICD-10-CM

## 2012-12-14 DIAGNOSIS — Z1231 Encounter for screening mammogram for malignant neoplasm of breast: Secondary | ICD-10-CM | POA: Diagnosis not present

## 2013-01-16 ENCOUNTER — Other Ambulatory Visit: Payer: Self-pay | Admitting: *Deleted

## 2013-01-16 MED ORDER — ATORVASTATIN CALCIUM 10 MG PO TABS: 10.0000 mg | ORAL_TABLET | Freq: Every day | ORAL | Status: DC

## 2013-01-22 ENCOUNTER — Other Ambulatory Visit: Payer: Self-pay | Admitting: Family Medicine

## 2013-01-22 NOTE — Telephone Encounter (Signed)
RX called into pharmacy

## 2013-01-22 NOTE — Telephone Encounter (Signed)
May give 4 refills total

## 2013-02-19 ENCOUNTER — Ambulatory Visit: Payer: Medicare Other | Admitting: Family Medicine

## 2013-04-10 ENCOUNTER — Telehealth: Payer: Self-pay | Admitting: Family Medicine

## 2013-04-10 NOTE — Telephone Encounter (Signed)
Patient stated the med is not working and scheduled an office visit to discuss alternatives.

## 2013-04-10 NOTE — Telephone Encounter (Signed)
Patient needs refill on sleep aid medication.  The medication is no longer effective and she would like for you to increase the dosage.  Medication name is Lorazapam 1.0 mg what she is taking now. [facility] [phone].

## 2013-04-10 NOTE — Telephone Encounter (Signed)
Can't safely do that, stick with current dose, give refill, schedule follow up to discuss alternatives

## 2013-04-12 ENCOUNTER — Ambulatory Visit (INDEPENDENT_AMBULATORY_CARE_PROVIDER_SITE_OTHER): Payer: Medicare Other | Admitting: Family Medicine

## 2013-04-12 ENCOUNTER — Encounter: Payer: Self-pay | Admitting: Family Medicine

## 2013-04-12 VITALS — BP 122/68 | Ht 68.0 in | Wt 230.8 lb

## 2013-04-12 DIAGNOSIS — G4700 Insomnia, unspecified: Secondary | ICD-10-CM | POA: Diagnosis not present

## 2013-04-12 MED ORDER — RAMELTEON 8 MG PO TABS: 8.0000 mg | ORAL_TABLET | Freq: Every day | ORAL | Status: DC

## 2013-04-12 NOTE — Progress Notes (Signed)
  Subjective:    Patient ID: Deanna Salinas, female    DOB: 1935-09-21, 77 y.o.   MRN: [id number]  HPI Patient arrives reporting difficulty sleeping-currently on Lorazepam 1mg  at bedtime but states it is not helping her sleep. For past three months not helping Stopped elavil due to "hung over " effect Difficulty falling asleep Practicing good slepp hygiene Patient has severe insomnia issues has had it for years trazodone caused side effects Elavil makes her feel hung over Ambien zones her out so therefore rule he doesn't have many options lorazepam has lost its effectiveness. She's also under a fair amount of stress because her daughter has breast cancer. Daughter with breast cancer she plans to go be with her daughter at the end of this month Review of Systems    see above Objective:   Physical Exam Lungs clear heart regular blood pressure noted     Assessment & Plan:  Severe insomnia-will try Rozerem 8 mg each night if this doesn't help next step would be the Lunesta or [place] 20 minutes spent with patient discussing all of this she will followup for her diabetes later this fall

## 2013-04-23 ENCOUNTER — Telehealth: Payer: Self-pay | Admitting: Family Medicine

## 2013-04-23 MED ORDER — ESZOPICLONE 2 MG PO TABS: 2.0000 mg | ORAL_TABLET | Freq: Every day | ORAL | Status: DC

## 2013-04-23 NOTE — Telephone Encounter (Signed)
Patient states the sleeping aide that she was given on 04/12/2013 does not work for her.  Could something else be prescribed?  [facility]  Please call Patient. Thanks

## 2013-04-23 NOTE — Telephone Encounter (Signed)
Given rozerem 8 mg

## 2013-04-23 NOTE — Telephone Encounter (Signed)
Rx printed and faxed to [facility]. Patient notified.

## 2013-04-23 NOTE — Telephone Encounter (Signed)
Well, stop rozarem, try Lunesta 2 mg qhs, pt running out of options, #30 2 refills

## 2013-05-17 ENCOUNTER — Telehealth: Payer: Self-pay | Admitting: Family Medicine

## 2013-05-17 DIAGNOSIS — E782 Mixed hyperlipidemia: Secondary | ICD-10-CM

## 2013-05-17 DIAGNOSIS — E119 Type 2 diabetes mellitus without complications: Secondary | ICD-10-CM

## 2013-05-17 DIAGNOSIS — Z79899 Other long term (current) drug therapy: Secondary | ICD-10-CM

## 2013-05-17 NOTE — Telephone Encounter (Signed)
Patient needs BW paperwork °

## 2013-05-18 NOTE — Telephone Encounter (Signed)
Notified patient's husband that blood work has been ordered and patient can report to [employer]. Husband verbalized understanding.

## 2013-05-18 NOTE — Telephone Encounter (Signed)
Yes plz do 

## 2013-05-18 NOTE — Telephone Encounter (Signed)
Had lipid, liver a1c and micro protien urine- just repeat these labs

## 2013-05-21 ENCOUNTER — Telehealth: Payer: Self-pay

## 2013-05-21 ENCOUNTER — Other Ambulatory Visit: Payer: Self-pay

## 2013-05-21 DIAGNOSIS — Z1211 Encounter for screening for malignant neoplasm of colon: Secondary | ICD-10-CM

## 2013-05-24 MED ORDER — PEG-KCL-NACL-NASULF-NA ASC-C 100 G PO SOLR: 1.0000 | ORAL | Status: DC

## 2013-05-24 NOTE — Telephone Encounter (Signed)
Appropriate. No Glucophage the morning of the procedure.

## 2013-05-24 NOTE — Telephone Encounter (Signed)
Gastroenterology Pre-Procedure Review  Request Date: 05/23/2013 Requesting Physician: Dr. Lilyan Punt  PATIENT REVIEW QUESTIONS: The patient responded to the following health history questions as indicated:    1. Diabetes Melitis:Yes 2. Joint replacements in the past 12 months: no 3. Major health problems in the past 3 months: no 4. Has an artificial valve or MVP: no 5. Has a defibrillator: no 6. Has been advised in past to take antibiotics in advance of a procedure like teeth cleaning: no    MEDICATIONS & ALLERGIES:    Patient reports the following regarding taking any blood thinners:   Plavix? no Aspirin? YES Coumadin? no  Patient confirms/reports the following medications:  Current Outpatient Prescriptions  Medication Sig Dispense Refill  . Acetaminophen (TYLENOL PO) Take by mouth. PRN      . aspirin 81 MG tablet Take 81 mg by mouth daily.      . diclofenac (VOLTAREN) 75 MG EC tablet Take 75 mg by mouth 1 day or 1 dose.       . eszopiclone (LUNESTA) 2 MG TABS tablet Take 1 tablet (2 mg total) by mouth at bedtime. Take immediately before bedtime  30 tablet  1  . metFORMIN (GLUCOPHAGE) 500 MG tablet Take 500 mg by mouth 2 (two) times daily with a meal.      . quinapril (ACCUPRIL) 20 MG tablet Take 20 mg by mouth 2 (two) times daily.       No current facility-administered medications for this visit.    Patient confirms/reports the following allergies:  Allergies  Allergen Reactions  . Ambien [Zolpidem Tartrate]     Side effects   . Lipitor [Atorvastatin]     Muscle and joint pain  . Pravastatin     Muscle and joint pain  . Trazodone And Nefazodone     Side effects    No orders of the defined types were placed in this encounter.    AUTHORIZATION INFORMATION Primary Insurance:   ID #:   Group #:  Pre-Cert / Auth required:  Pre-Cert / Auth #:   Secondary Insurance:   ID #:   Group #:  Pre-Cert / Auth required:  Pre-Cert / Auth #:    SCHEDULE  INFORMATION: Procedure has been scheduled as follows:  Date: 06/11/1913              Time:  8:30 am Location: [facility] Short Stay  This Gastroenterology Pre-Precedure Review Form is being routed to the following provider(s): R. Roetta Sessions, MD

## 2013-05-24 NOTE — Telephone Encounter (Signed)
Rx sent to pharmacy and instructions mailed to pt.  

## 2013-05-25 DIAGNOSIS — Z79899 Other long term (current) drug therapy: Secondary | ICD-10-CM | POA: Diagnosis not present

## 2013-05-25 DIAGNOSIS — E782 Mixed hyperlipidemia: Secondary | ICD-10-CM | POA: Diagnosis not present

## 2013-05-25 DIAGNOSIS — E119 Type 2 diabetes mellitus without complications: Secondary | ICD-10-CM | POA: Diagnosis not present

## 2013-05-26 ENCOUNTER — Other Ambulatory Visit: Payer: Self-pay | Admitting: Family Medicine

## 2013-05-26 DIAGNOSIS — Z79899 Other long term (current) drug therapy: Secondary | ICD-10-CM | POA: Diagnosis not present

## 2013-05-26 DIAGNOSIS — E119 Type 2 diabetes mellitus without complications: Secondary | ICD-10-CM | POA: Diagnosis not present

## 2013-05-26 DIAGNOSIS — E782 Mixed hyperlipidemia: Secondary | ICD-10-CM | POA: Diagnosis not present

## 2013-05-26 LAB — HEPATIC FUNCTION PANEL
ALT: 23 U/L (ref 0–35)
AST: 19 U/L (ref 0–37)
Albumin: 4.1 g/dL (ref 3.5–5.2)
Alkaline Phosphatase: 81 U/L (ref 39–117)
Bilirubin, Direct: 0.1 mg/dL (ref 0.0–0.3)
Indirect Bilirubin: 0.4 mg/dL (ref 0.0–0.9)
Total Bilirubin: 0.5 mg/dL (ref 0.3–1.2)
Total Protein: 7 g/dL (ref 6.0–8.3)

## 2013-05-26 LAB — MICROALBUMIN, URINE

## 2013-05-26 LAB — HEMOGLOBIN A1C
Hgb A1c MFr Bld: 6.5 % — ABNORMAL HIGH (ref <5.7)
Mean Plasma Glucose: 140 mg/dL — ABNORMAL HIGH (ref <117)

## 2013-05-26 LAB — LIPID PANEL
Cholesterol: 210 mg/dL — ABNORMAL HIGH (ref 0–200)
HDL: 49 mg/dL (ref >39)
LDL Cholesterol: 126 mg/dL — ABNORMAL HIGH (ref 0–99)
Total CHOL/HDL Ratio: 4.3 Ratio
Triglycerides: 173 mg/dL — ABNORMAL HIGH (ref <150)
VLDL: 35 mg/dL (ref 0–40)

## 2013-05-27 LAB — MICROALBUMIN, URINE: Microalb, Ur: 0.5 mg/dL (ref 0.00–1.89)

## 2013-05-30 ENCOUNTER — Encounter (HOSPITAL_COMMUNITY): Payer: Self-pay | Admitting: Pharmacy Technician

## 2013-05-31 ENCOUNTER — Ambulatory Visit (INDEPENDENT_AMBULATORY_CARE_PROVIDER_SITE_OTHER): Payer: Medicare Other | Admitting: Family Medicine

## 2013-05-31 ENCOUNTER — Encounter: Payer: Self-pay | Admitting: Family Medicine

## 2013-05-31 VITALS — BP 122/82 | Ht 68.0 in | Wt 223.2 lb

## 2013-05-31 DIAGNOSIS — R7301 Impaired fasting glucose: Secondary | ICD-10-CM

## 2013-05-31 DIAGNOSIS — I10 Essential (primary) hypertension: Secondary | ICD-10-CM | POA: Diagnosis not present

## 2013-05-31 DIAGNOSIS — E785 Hyperlipidemia, unspecified: Secondary | ICD-10-CM | POA: Diagnosis not present

## 2013-05-31 DIAGNOSIS — G4700 Insomnia, unspecified: Secondary | ICD-10-CM

## 2013-05-31 DIAGNOSIS — E119 Type 2 diabetes mellitus without complications: Secondary | ICD-10-CM

## 2013-05-31 DIAGNOSIS — Z23 Encounter for immunization: Secondary | ICD-10-CM | POA: Diagnosis not present

## 2013-05-31 DIAGNOSIS — M1990 Unspecified osteoarthritis, unspecified site: Secondary | ICD-10-CM

## 2013-05-31 MED ORDER — ESZOPICLONE 2 MG PO TABS: 2.0000 mg | ORAL_TABLET | Freq: Every day | ORAL | Status: DC

## 2013-05-31 NOTE — Patient Instructions (Signed)
If you use Red Rice Yeast Extract consider rechecking your cholesterol in 6 to 8 weeks, just call us for the paper work. Recheck here in springtime

## 2013-05-31 NOTE — Progress Notes (Signed)
  Subjective:    Patient ID: Deanna Salinas, female    DOB: 1935-11-12, 77 y.o.   MRN: [id number]  HPI Patient is here today b/c she would like to talk about a different medication other than Voltaren. Patient patient relates that the medication eventually cause stomach irritation and discomfort. She did not like the side effects of it. She is interested about other possibilities  She would also like the flu vaccine.  Pt wants you to check out a mole that is on her right leg. She had a spot on the right leg there is an area near it got sore she was worried about it want [place] to look at  Would also like to talk about a CRP test? She read about this in a women's magazine was interested in knowing if if there is any benefit to it She denies excessive thirst urination chest tightness pressure pain shortness breath she does relate low back pain joint pains in her knees she also relates at times feeling tired denies being depressed under some stress because her daughter is going through cancer treatment.  PMH see previous notes he problem list. Family history noncontributory Social does not smoke    Review of Systems See above    Objective:   Physical Exam  Neck no masses, lungs clear no crackles heart is regular. Pulse normal blood pressure good abdomen soft him benign mole noted on the right leg varicose vein near it that's tender but not thrombosed extremities no edema patient does have calluses on the feet and some foot deformity but no signs of any ulcers      Assessment & Plan:  #1 diabetes good control overall continue current diet #2 HTN good control continue current measures #3 encourage low salt diet regular exercise #4 osteoarthritis Tylenol would be the best approach but if over the next couple months that she's not doing better and she wants to try different medication she could call us and we could try short acting anti-inflammatory other than Voltaren #5 insomnia issues  continue current medication #6 mild hyperopic 1 hyperlipidemia I recommend red rice yeast extract recheck lab work in approximately 6-8 weeks Patient to followup in 5-6 months

## 2013-06-11 ENCOUNTER — Encounter (HOSPITAL_COMMUNITY)
Admission: RE | Disposition: A | Discharge status: Home / Self Care | Payer: Self-pay | Source: Ambulatory Visit | Attending: Internal Medicine

## 2013-06-11 ENCOUNTER — Ambulatory Visit (HOSPITAL_COMMUNITY)
Admission: RE | Admit: 2013-06-11 | Discharge: 2013-06-11 | Disposition: A | Discharge status: Home / Self Care | Payer: Medicare Other | Source: Ambulatory Visit | Attending: Internal Medicine | Admitting: Internal Medicine

## 2013-06-11 ENCOUNTER — Encounter (HOSPITAL_COMMUNITY): Payer: Self-pay | Admitting: *Deleted

## 2013-06-11 DIAGNOSIS — Z1211 Encounter for screening for malignant neoplasm of colon: Secondary | ICD-10-CM | POA: Diagnosis not present

## 2013-06-11 DIAGNOSIS — E119 Type 2 diabetes mellitus without complications: Secondary | ICD-10-CM | POA: Insufficient documentation

## 2013-06-11 DIAGNOSIS — I10 Essential (primary) hypertension: Secondary | ICD-10-CM | POA: Diagnosis not present

## 2013-06-11 DIAGNOSIS — K5730 Diverticulosis of large intestine without perforation or abscess without bleeding: Secondary | ICD-10-CM | POA: Diagnosis not present

## 2013-06-11 DIAGNOSIS — D126 Benign neoplasm of colon, unspecified: Secondary | ICD-10-CM | POA: Diagnosis not present

## 2013-06-11 DIAGNOSIS — Z01812 Encounter for preprocedural laboratory examination: Secondary | ICD-10-CM | POA: Diagnosis not present

## 2013-06-11 HISTORY — PX: COLONOSCOPY: SHX5424

## 2013-06-11 LAB — GLUCOSE, CAPILLARY: Glucose-Capillary: 120 mg/dL — ABNORMAL HIGH (ref 70–99)

## 2013-06-11 SURGERY — COLONOSCOPY
Anesthesia: Moderate Sedation

## 2013-06-11 MED ORDER — MEPERIDINE HCL 100 MG/ML IJ SOLN
INTRAMUSCULAR | Status: DC | PRN
  Administered 2013-06-11: 25 mg via INTRAVENOUS
  Administered 2013-06-11: 50 mg via INTRAVENOUS
  Administered 2013-06-11: 25 mg via INTRAVENOUS

## 2013-06-11 MED ORDER — SODIUM CHLORIDE 0.9 % IV SOLN
INTRAVENOUS | Status: DC
  Administered 2013-06-11: 08:00:00 via INTRAVENOUS

## 2013-06-11 MED ORDER — MIDAZOLAM HCL 5 MG/5ML IJ SOLN
INTRAMUSCULAR | Status: AC
  Filled 2013-06-11: qty 10

## 2013-06-11 MED ORDER — ONDANSETRON HCL 4 MG/2ML IJ SOLN
INTRAMUSCULAR | Status: AC
  Filled 2013-06-11: qty 2

## 2013-06-11 MED ORDER — MEPERIDINE HCL 100 MG/ML IJ SOLN
INTRAMUSCULAR | Status: AC
  Filled 2013-06-11: qty 2

## 2013-06-11 MED ORDER — MIDAZOLAM HCL 5 MG/5ML IJ SOLN
INTRAMUSCULAR | Status: DC | PRN
  Administered 2013-06-11: 1 mg via INTRAVENOUS
  Administered 2013-06-11: 2 mg via INTRAVENOUS
  Administered 2013-06-11: 1 mg via INTRAVENOUS

## 2013-06-11 MED ORDER — STERILE WATER FOR IRRIGATION IR SOLN
Status: DC | PRN
  Administered 2013-06-11: 08:00:00

## 2013-06-11 MED ORDER — ONDANSETRON HCL 4 MG/2ML IJ SOLN
INTRAMUSCULAR | Status: DC | PRN
  Administered 2013-06-11: 4 mg via INTRAVENOUS

## 2013-06-11 NOTE — Progress Notes (Signed)
Pt left AMA.  Refused to wait  for discharge instructions.

## 2013-06-11 NOTE — Op Note (Signed)
[facility] [address] [place], [address]   COLONOSCOPY PROCEDURE REPORT  PATIENT: Deanna Salinas, Deanna Salinas  MR#:         [id number] BIRTHDATE: May 24, 1936 , 77  yrs. old GENDER: Female ENDOSCOPIST: R.  Roetta Sessions, MD FACP FACG REFERRED BY:  Lilyan Punt, M.D. PROCEDURE DATE:  06/11/2013 PROCEDURE:     Colonoscopy with snare polypectomy  INDICATIONS: Average risk colorectal cancer screening.  INFORMED CONSENT:  The risks, benefits, alternatives and imponderables including but not limited to bleeding, perforation as well as the possibility of a missed lesion have been reviewed.  The potential for biopsy, lesion removal, etc. have also been discussed.  Questions have been answered.  All parties agreeable. Please see the history and physical in the medical record for more information.  MEDICATIONS: Versed 5 mg IV and Demerol 100 mg IV in divided doses. Zofran 4 mg IV.  DESCRIPTION OF PROCEDURE:  After a digital rectal exam was performed, the EC-[id number] ([id number])  colonoscope was advanced from the anus through the rectum and colon to the area of the cecum, ileocecal valve and appendiceal orifice.  The cecum was deeply intubated.  These structures were well-seen and photographed for the record.  From the level of the cecum and ileocecal valve, the scope was slowly and cautiously withdrawn.  The mucosal surfaces were carefully surveyed utilizing scope tip deflection to facilitate fold flattening as needed.  The scope was pulled down into the rectum where a thorough examination including retroflexion was performed.    FINDINGS:  Adequate preparation. Anal papilla; otherwise normal rectum. Scattered small mouth left-sided and transverse diverticula. (1) 2 x 4 mm polyp in the mid ascending segment.; Otherwise, the remainder of colonic mucosa appeared normal. Patient had somewhat of a "rimmed appearing" appendiceal orifice. This appeared to be benign. Patient reports  this is a chronic finding.  THERAPEUTIC / DIAGNOSTIC MANEUVERS PERFORMED:  The above-mentioned polyp was cold snared/removed.  COMPLICATIONS: None  CECAL WITHDRAWAL TIME:  10 minutes  IMPRESSION:  Colonic diverticulosis. Colonic polyps removed as described above  RECOMMENDATIONS: Followup on pathology.   _______________________________ eSigned:  R. Roetta Sessions, MD FACP [facility] 06/11/2013 8:44 AM   CC:

## 2013-06-11 NOTE — H&P (Signed)
Primary Care Physician:  Lilyan Punt, MD Primary Gastroenterologist:  Dr. Jena Gauss  Pre-Procedure History & Physical: HPI:  Deanna Salinas is a 77 y.o. female is here for a screening colonoscopy.   Last exam in [place] 10 years ago-reportedly negative. No bowel symptoms. No family history of colon polyps or colon cancer.  Past Medical History  Diagnosis Date  . Hypertension   . Hyperlipidemia   . Hyperglycemia   . Fatty liver   . Diabetes mellitus without complication     Past Surgical History  Procedure Laterality Date  . Cesarean section    . Colonoscopy      Prior to Admission medications   Medication Sig Start Date End Date Taking? Authorizing Provider  Acetaminophen (TYLENOL PO) Take 1 tablet by mouth every 6 (six) hours as needed (pain). PRN   Yes Historical Provider, MD  aspirin EC 81 MG tablet Take 81 mg by mouth daily.   Yes Historical Provider, MD  metFORMIN (GLUCOPHAGE) 500 MG tablet Take 500 mg by mouth 2 (two) times daily with a meal.   Yes Historical Provider, MD  quinapril (ACCUPRIL) 20 MG tablet Take 20 mg by mouth 2 (two) times daily.   Yes Historical Provider, MD  eszopiclone (LUNESTA) 2 MG TABS tablet Take 1 tablet (2 mg total) by mouth at bedtime. Take immediately before bedtime 05/31/13   Babs Sciara, MD    Allergies as of 05/21/2013 - Review Complete 05/21/2013  Allergen Reaction Noted  . Ambien [zolpidem tartrate]  12/09/2012  . Lipitor [atorvastatin]  12/09/2012  . Pravastatin  12/09/2012  . Trazodone and nefazodone  12/09/2012    Family History  Problem Relation Age of Onset  . Hypertension Father   . Hypertension Other   . Hypertension Other   . Colon cancer Neg Hx     History   Social History  . Marital Status: Married    Spouse Name: N/A    Number of Children: N/A  . Years of Education: N/A   Occupational History  . Not on file.   Social History Main Topics  . Smoking status: Never Smoker   . Smokeless tobacco: Not on file  .  Alcohol Use: 1.8 oz/week    3 Glasses of wine per week  . Drug Use: No  . Sexual Activity: Not on file   Other Topics Concern  . Not on file   Social History Narrative  . No narrative on file    Review of Systems: See HPI, otherwise negative ROS  Physical Exam: BP 164/94  Pulse 82  Temp(Src) 98.2 F (36.8 C) (Oral)  Resp 18  Ht 5\' 8"  (1.727 m)  Wt 220 lb (99.791 kg)  BMI 33.46 kg/m2  SpO2 98% General:   Alert,  Well-developed, well-nourished, pleasant and cooperative in NAD Head:  Normocephalic and atraumatic. Eyes:  Sclera clear, no icterus.   Conjunctiva pink. Ears:  Normal auditory acuity. Nose:  No deformity, discharge,  or lesions. Mouth:  No deformity or lesions, dentition normal. Neck:  Supple; no masses or thyromegaly. Lungs:  Clear throughout to auscultation.   No wheezes, crackles, or rhonchi. No acute distress. Heart:  Regular rate and rhythm; no murmurs, clicks, rubs,  or gallops. Abdomen:  Soft, nontender and nondistended. No masses, hepatosplenomegaly or hernias noted. Normal bowel sounds, without guarding, and without rebound.   Msk:  Symmetrical without gross deformities. Normal posture. Pulses:  Normal pulses noted. Extremities:  Without clubbing or edema. Neurologic:  Alert and  oriented  x4;  grossly normal neurologically. Skin:  Intact without significant lesions or rashes. Cervical Nodes:  No significant cervical adenopathy. Psych:  Alert and cooperative. Normal mood and affect.  Impression/Plan: Deanna Salinas is now here to undergo a screening colonoscopy.  Average risk screening examination.  Risks, benefits, limitations, imponderables and alternatives regarding colonoscopy have been reviewed with the patient. Questions have been answered. All parties agreeable.

## 2013-06-11 NOTE — Progress Notes (Signed)
Will send discharge instructions to pt via mail.

## 2013-06-14 ENCOUNTER — Encounter: Payer: Self-pay | Admitting: Internal Medicine

## 2013-06-14 ENCOUNTER — Encounter (HOSPITAL_COMMUNITY): Payer: Self-pay | Admitting: Internal Medicine

## 2013-07-31 ENCOUNTER — Telehealth: Payer: Self-pay | Admitting: Family Medicine

## 2013-07-31 ENCOUNTER — Other Ambulatory Visit: Payer: Self-pay | Admitting: *Deleted

## 2013-07-31 MED ORDER — QUINAPRIL HCL 20 MG PO TABS: 20.0000 mg | ORAL_TABLET | Freq: Two times a day (BID) | ORAL | Status: DC

## 2013-07-31 NOTE — Telephone Encounter (Signed)
Patient needs Rx for quinapril 20 mg to [facility]

## 2013-07-31 NOTE — Telephone Encounter (Signed)
Med sent in and patient notified.  

## 2013-10-31 ENCOUNTER — Telehealth: Payer: Self-pay | Admitting: Family Medicine

## 2013-10-31 DIAGNOSIS — E782 Mixed hyperlipidemia: Secondary | ICD-10-CM

## 2013-10-31 DIAGNOSIS — E119 Type 2 diabetes mellitus without complications: Secondary | ICD-10-CM

## 2013-10-31 NOTE — Telephone Encounter (Signed)
Had lipid, liver, a1c, microalb urine on 05/25/13

## 2013-10-31 NOTE — Telephone Encounter (Signed)
Lipid, HgA1C, and order her strips please

## 2013-10-31 NOTE — Telephone Encounter (Signed)
Discussed with pt. bloodwork orders are done. Pt to go over to lab fasing. rx faxed to [facility].

## 2013-10-31 NOTE — Telephone Encounter (Signed)
Patient needs Contour Next meter and lancets to [facility]..  Also needing order for bloodwork.

## 2013-11-02 ENCOUNTER — Other Ambulatory Visit: Payer: Self-pay | Admitting: Family Medicine

## 2013-11-02 NOTE — Telephone Encounter (Signed)
Seen 05/31/13

## 2013-11-07 LAB — HM DIABETES EYE EXAM

## 2013-11-13 DIAGNOSIS — Z961 Presence of intraocular lens: Secondary | ICD-10-CM | POA: Diagnosis not present

## 2013-11-13 DIAGNOSIS — E782 Mixed hyperlipidemia: Secondary | ICD-10-CM | POA: Diagnosis not present

## 2013-11-13 DIAGNOSIS — E119 Type 2 diabetes mellitus without complications: Secondary | ICD-10-CM | POA: Diagnosis not present

## 2013-11-14 LAB — LIPID PANEL
Cholesterol: 210 mg/dL — ABNORMAL HIGH (ref 0–200)
HDL: 50 mg/dL (ref >39)
LDL Cholesterol: 135 mg/dL — ABNORMAL HIGH (ref 0–99)
Total CHOL/HDL Ratio: 4.2 Ratio
Triglycerides: 127 mg/dL (ref <150)
VLDL: 25 mg/dL (ref 0–40)

## 2013-11-14 LAB — HEMOGLOBIN A1C
Hgb A1c MFr Bld: 6.4 % — ABNORMAL HIGH (ref <5.7)
Mean Plasma Glucose: 137 mg/dL — ABNORMAL HIGH (ref <117)

## 2013-11-19 ENCOUNTER — Encounter: Payer: Self-pay | Admitting: Family Medicine

## 2013-11-19 ENCOUNTER — Ambulatory Visit (INDEPENDENT_AMBULATORY_CARE_PROVIDER_SITE_OTHER): Payer: Medicare Other | Admitting: Family Medicine

## 2013-11-19 VITALS — BP 112/70 | Ht 68.0 in | Wt 224.2 lb

## 2013-11-19 DIAGNOSIS — E785 Hyperlipidemia, unspecified: Secondary | ICD-10-CM | POA: Diagnosis not present

## 2013-11-19 DIAGNOSIS — R5383 Other fatigue: Secondary | ICD-10-CM

## 2013-11-19 DIAGNOSIS — E119 Type 2 diabetes mellitus without complications: Secondary | ICD-10-CM | POA: Diagnosis not present

## 2013-11-19 DIAGNOSIS — M5382 Other specified dorsopathies, cervical region: Secondary | ICD-10-CM

## 2013-11-19 DIAGNOSIS — M629 Disorder of muscle, unspecified: Secondary | ICD-10-CM

## 2013-11-19 DIAGNOSIS — R5381 Other malaise: Secondary | ICD-10-CM | POA: Diagnosis not present

## 2013-11-19 MED ORDER — METFORMIN HCL 500 MG PO TABS: 500.0000 mg | ORAL_TABLET | Freq: Two times a day (BID) | ORAL | Status: DC

## 2013-11-19 MED ORDER — QUINAPRIL HCL 20 MG PO TABS: 20.0000 mg | ORAL_TABLET | Freq: Two times a day (BID) | ORAL | Status: DC

## 2013-11-19 MED ORDER — NAPROXEN 500 MG PO TABS: 500.0000 mg | ORAL_TABLET | Freq: Two times a day (BID) | ORAL | Status: DC

## 2013-11-19 NOTE — Progress Notes (Signed)
   Subjective:    Patient ID: Deanna Salinas, female    DOB: 13-Dec-1935, 78 y.o.   MRN: [id number]  Diabetes She presents for her follow-up diabetic visit. She has type 2 diabetes mellitus. Her disease course has been stable. There are no hypoglycemic associated symptoms. Pertinent negatives for hypoglycemia include no confusion. There are no diabetic associated symptoms. Pertinent negatives for diabetes include no chest pain, no fatigue, no polydipsia, no polyphagia and no weakness. There are no hypoglycemic complications. Symptoms are stable. There are no diabetic complications. There are no known risk factors for coronary artery disease. Current diabetic treatment includes oral agent (monotherapy). She is compliant with treatment all of the time.  Patient had bloodwork done on 11/13/13 results in the system.   Patient states she has pain in her right shoulder that radiates into her back. Numbness is noted. It has been present for about 6 weeks now.  Patient has been intolerant to statins but she has been using red rice yeast extract but she stopped taking it several days per week on a regular basis she states she has not been faithful in taking it She states she could be doing better with dietary measures and could be doing better with physical activity.   Review of Systems  Constitutional: Negative for activity change, appetite change and fatigue.  HENT: Negative for congestion.   Respiratory: Negative for apnea, cough, choking and shortness of breath.   Cardiovascular: Negative for chest pain and leg swelling.  Gastrointestinal: Negative for abdominal pain.  Endocrine: Negative for polydipsia and polyphagia.  Genitourinary: Negative for frequency.  Musculoskeletal: Positive for arthralgias and myalgias. Negative for back pain.  Skin: Negative for rash.  Neurological: Negative for weakness.  Psychiatric/Behavioral: Negative for confusion.       Objective:   Physical Exam  Vitals  reviewed. Constitutional: She appears well-nourished. No distress.  HENT:  Head: Normocephalic and atraumatic.  Cardiovascular: Normal rate, regular rhythm and normal heart sounds.   No murmur heard. Pulmonary/Chest: Effort normal and breath sounds normal. No respiratory distress.  Musculoskeletal: She exhibits no edema.  Lymphadenopathy:    She has no cervical adenopathy.  Neurological: She is alert. She exhibits normal muscle tone.  Psychiatric: Her behavior is normal.   patient does have tenderness in the right trapezius as well or is around her right shoulder she does not have any reduction in range of motion of the shoulder. Neurologically she's intact diabetic foot exam is negative.        Assessment & Plan:  #1 diabetes A1c looks good continue current measures #2 HTN good control #3 hyperlipidemia could be doing better. I recommend red rice yeast extract on a daily basis. Followup if ongoing troubles. Patient should followup 6 months. Insomnia she uses Lunesta for this Right trapezius pain recommend Naprosyn for no more than 2 weeks. If ongoing troubles or if worse may end up needing to consider having further testing. Range of motion exercises were shown she will let us know if it gives ongoing trouble  She also relates fatigue and tiredness we will be checking some lab work this been going on for months she states she unfortunately has some moderate physical deconditioning

## 2013-11-26 DIAGNOSIS — R5381 Other malaise: Secondary | ICD-10-CM | POA: Diagnosis not present

## 2013-11-26 DIAGNOSIS — R5383 Other fatigue: Secondary | ICD-10-CM | POA: Diagnosis not present

## 2013-11-26 LAB — CBC WITH DIFFERENTIAL/PLATELET
Basophils Absolute: 0 10*3/uL (ref 0.0–0.1)
Basophils Relative: 0 % (ref 0–1)
Eosinophils Absolute: 0.2 10*3/uL (ref 0.0–0.7)
Eosinophils Relative: 2 % (ref 0–5)
HCT: 38.8 % (ref 36.0–46.0)
Hemoglobin: 13.4 g/dL (ref 12.0–15.0)
Lymphocytes Relative: 36 % (ref 12–46)
Lymphs Abs: 3.5 10*3/uL (ref 0.7–4.0)
MCH: 31 pg (ref 26.0–34.0)
MCHC: 34.5 g/dL (ref 30.0–36.0)
MCV: 89.8 fL (ref 78.0–100.0)
Monocytes Absolute: 0.7 10*3/uL (ref 0.1–1.0)
Monocytes Relative: 7 % (ref 3–12)
Neutro Abs: 5.4 10*3/uL (ref 1.7–7.7)
Neutrophils Relative %: 55 % (ref 43–77)
Platelets: 305 10*3/uL (ref 150–400)
RBC: 4.32 MIL/uL (ref 3.87–5.11)
RDW: 13.2 % (ref 11.5–15.5)
WBC: 9.8 10*3/uL (ref 4.0–10.5)

## 2013-11-27 ENCOUNTER — Telehealth: Payer: Self-pay | Admitting: Family Medicine

## 2013-11-27 ENCOUNTER — Other Ambulatory Visit: Payer: Self-pay

## 2013-11-27 DIAGNOSIS — E039 Hypothyroidism, unspecified: Secondary | ICD-10-CM

## 2013-11-27 DIAGNOSIS — E038 Other specified hypothyroidism: Secondary | ICD-10-CM

## 2013-11-27 LAB — TSH: TSH: 4.599 u[IU]/mL — ABNORMAL HIGH (ref 0.350–4.500)

## 2013-11-27 NOTE — Telephone Encounter (Signed)
Patient transferred to front desk to schedule appointment.  

## 2013-11-27 NOTE — Telephone Encounter (Signed)
Pt states she is having abd pains that have caused her to break out in sweats  And almost makes her double over. Laying around on the sofa. Called the pharmacist  Whom told her she take two different meds that can cause this side effect. (Metformin and diclofenac)   She is having no blood in stools, colonoscopy came back clear, no fever, no gas or belching Does not feel bloated, and her bowel movements are fine.   It is tender to the touch at the upper abd area.  Radiates around to her back. No painful urination   [place]

## 2013-11-28 ENCOUNTER — Encounter: Payer: Self-pay | Admitting: Nurse Practitioner

## 2013-11-28 ENCOUNTER — Ambulatory Visit (INDEPENDENT_AMBULATORY_CARE_PROVIDER_SITE_OTHER): Payer: Medicare Other | Admitting: Nurse Practitioner

## 2013-11-28 VITALS — BP 100/80 | Temp 98.6°F | Ht 68.0 in | Wt 221.2 lb

## 2013-11-28 DIAGNOSIS — R109 Unspecified abdominal pain: Secondary | ICD-10-CM | POA: Diagnosis not present

## 2013-11-28 LAB — POCT URINALYSIS DIPSTICK
Blood, UA: 10
Spec Grav, UA: 1.015
pH, UA: 5

## 2013-11-28 LAB — BASIC METABOLIC PANEL
BUN: 19 mg/dL (ref 6–23)
CO2: 28 mEq/L (ref 19–32)
Calcium: 10.5 mg/dL (ref 8.4–10.5)
Chloride: 99 mEq/L (ref 96–112)
Creat: 0.99 mg/dL (ref 0.50–1.10)
Glucose, Bld: 150 mg/dL — ABNORMAL HIGH (ref 70–99)
Potassium: 5.4 mEq/L — ABNORMAL HIGH (ref 3.5–5.3)
Sodium: 139 mEq/L (ref 135–145)

## 2013-11-28 LAB — CBC WITH DIFFERENTIAL/PLATELET
Basophils Absolute: 0 10*3/uL (ref 0.0–0.1)
Basophils Relative: 0 % (ref 0–1)
Eosinophils Absolute: 0.2 10*3/uL (ref 0.0–0.7)
Eosinophils Relative: 2 % (ref 0–5)
HCT: 42.2 % (ref 36.0–46.0)
Hemoglobin: 13.9 g/dL (ref 12.0–15.0)
Lymphocytes Relative: 30 % (ref 12–46)
Lymphs Abs: 3.5 10*3/uL (ref 0.7–4.0)
MCH: 31.2 pg (ref 26.0–34.0)
MCHC: 32.9 g/dL (ref 30.0–36.0)
MCV: 94.8 fL (ref 78.0–100.0)
Monocytes Absolute: 1 10*3/uL (ref 0.1–1.0)
Monocytes Relative: 9 % (ref 3–12)
Neutro Abs: 6.8 10*3/uL (ref 1.7–7.7)
Neutrophils Relative %: 59 % (ref 43–77)
Platelets: 313 10*3/uL (ref 150–400)
RBC: 4.45 MIL/uL (ref 3.87–5.11)
RDW: 12.3 % (ref 11.5–15.5)
WBC: 11.6 10*3/uL — ABNORMAL HIGH (ref 4.0–10.5)

## 2013-11-28 LAB — HEPATIC FUNCTION PANEL
ALT: 22 U/L (ref 0–35)
AST: 21 U/L (ref 0–37)
Albumin: 4.2 g/dL (ref 3.5–5.2)
Alkaline Phosphatase: 89 U/L (ref 39–117)
Bilirubin, Direct: <0.1 mg/dL (ref 0.0–0.3)
Total Bilirubin: 0.4 mg/dL (ref 0.3–1.2)
Total Protein: 8.1 g/dL (ref 6.0–8.3)

## 2013-11-28 LAB — LIPASE: Lipase: 98 U/L — ABNORMAL HIGH (ref 11–59)

## 2013-11-28 NOTE — Progress Notes (Signed)
Subjective:  Presents with complaints of off-and-on abdominal pain for about a year. Having every day for the past 2 months. Had intense pain yesterday. Describes as similar to severe menstrual cramps. Can last up to 3 hours at a time. Mainly occurring in the right lower abdomen. Has not identified any specific triggers. Has had symptoms at nighttime. Unassociated with meals or particular foods. No acid reflux. Started metformin 500 mg with her evening meal about a month ago. No fever. No urinary symptoms. Has had 5 C-sections, a hysterectomy and appendectomy. No nausea vomiting. No upper abdominal pain. Still has both ovaries.  Objective:   BP 100/80  Temp(Src) 98.6 F (37 C) (Oral)  Ht 5\' 8"  (1.727 m)  Wt 221 lb 4 oz (100.358 kg)  BMI 33.65 kg/m2 NAD. Alert, oriented. Lungs clear. Heart regular rate rhythm. Abdomen obese soft nondistended with active bowel sounds x4. Minimal epigastric area discomfort on exam. Localized area of tenderness with deep palpation in the right lower quadrant just right of the pelvic area. No obvious masses but exam limited due to abdominal girth. Significant thick vertical scarring noted midline lower abdomen from previous surgeries. No tenderness on the left lower abdominal area.  Assessment: Abdominal pain, unspecified site - Plan: POCT urinalysis dipstick, CBC with Differential, Hepatic function panel, Basic metabolic panel, Lipase, CT Abdomen Pelvis W Contrast   Plan: Hold on metformin and diclofenac. Further followup based on test results. Patient to call or seek help immediately if fever increased abdominal pain vomiting or bloody stools.

## 2013-11-29 ENCOUNTER — Other Ambulatory Visit (INDEPENDENT_AMBULATORY_CARE_PROVIDER_SITE_OTHER): Payer: Medicare Other | Admitting: Nurse Practitioner

## 2013-11-29 ENCOUNTER — Ambulatory Visit (HOSPITAL_COMMUNITY)
Admission: RE | Admit: 2013-11-29 | Discharge: 2013-11-29 | Disposition: A | Discharge status: Home / Self Care | Payer: Medicare Other | Source: Ambulatory Visit | Attending: Nurse Practitioner | Admitting: Nurse Practitioner

## 2013-11-29 DIAGNOSIS — R109 Unspecified abdominal pain: Secondary | ICD-10-CM

## 2013-11-29 DIAGNOSIS — Q6101 Congenital single renal cyst: Secondary | ICD-10-CM | POA: Insufficient documentation

## 2013-11-29 DIAGNOSIS — R937 Abnormal findings on diagnostic imaging of other parts of musculoskeletal system: Secondary | ICD-10-CM | POA: Diagnosis not present

## 2013-11-29 DIAGNOSIS — N281 Cyst of kidney, acquired: Secondary | ICD-10-CM | POA: Diagnosis not present

## 2013-11-29 LAB — POCT UA - MICROSCOPIC ONLY
Bacteria, U Microscopic: NEGATIVE
RBC, urine, microscopic: NEGATIVE
WBC, Ur, HPF, POC: NEGATIVE

## 2013-11-29 MED ORDER — IOHEXOL 300 MG/ML  SOLN
100.0000 mL | Freq: Once | INTRAMUSCULAR | Status: AC | PRN
  Administered 2013-11-29: 100 mL via INTRAVENOUS

## 2013-11-30 ENCOUNTER — Ambulatory Visit (HOSPITAL_COMMUNITY): Payer: Medicare Other

## 2013-12-04 ENCOUNTER — Ambulatory Visit (INDEPENDENT_AMBULATORY_CARE_PROVIDER_SITE_OTHER): Payer: Medicare Other | Admitting: Family Medicine

## 2013-12-04 ENCOUNTER — Other Ambulatory Visit: Payer: Self-pay | Admitting: *Deleted

## 2013-12-04 ENCOUNTER — Encounter: Payer: Self-pay | Admitting: Family Medicine

## 2013-12-04 VITALS — BP 118/80 | Temp 98.3°F | Ht 68.0 in | Wt 221.0 lb

## 2013-12-04 DIAGNOSIS — K859 Acute pancreatitis without necrosis or infection, unspecified: Secondary | ICD-10-CM | POA: Diagnosis not present

## 2013-12-04 LAB — CBC WITH DIFFERENTIAL/PLATELET
Basophils Absolute: 0 10*3/uL (ref 0.0–0.1)
Basophils Relative: 0 % (ref 0–1)
Eosinophils Absolute: 0.2 10*3/uL (ref 0.0–0.7)
Eosinophils Relative: 2 % (ref 0–5)
HCT: 41.3 % (ref 36.0–46.0)
Hemoglobin: 13.7 g/dL (ref 12.0–15.0)
Lymphocytes Relative: 28 % (ref 12–46)
Lymphs Abs: 3.4 10*3/uL (ref 0.7–4.0)
MCH: 31.4 pg (ref 26.0–34.0)
MCHC: 33.2 g/dL (ref 30.0–36.0)
MCV: 94.7 fL (ref 78.0–100.0)
Monocytes Absolute: 0.7 10*3/uL (ref 0.1–1.0)
Monocytes Relative: 6 % (ref 3–12)
Neutro Abs: 7.9 10*3/uL — ABNORMAL HIGH (ref 1.7–7.7)
Neutrophils Relative %: 64 % (ref 43–77)
Platelets: 315 10*3/uL (ref 150–400)
RBC: 4.36 MIL/uL (ref 3.87–5.11)
RDW: 12.4 % (ref 11.5–15.5)
WBC: 12.3 10*3/uL — ABNORMAL HIGH (ref 4.0–10.5)

## 2013-12-04 LAB — AMYLASE: Amylase: 50 U/L (ref 0–105)

## 2013-12-04 LAB — LIPASE: Lipase: 81 U/L — ABNORMAL HIGH (ref 11–59)

## 2013-12-04 NOTE — Progress Notes (Signed)
Patient notified and verbalized understanding of the test results. No further questions. 

## 2013-12-04 NOTE — Progress Notes (Signed)
   Subjective:    Patient ID: Deanna Salinas, female    DOB: [birth year], 78 y.o.   MRN: [id number]  HPIFollow up on abdominal pain. Had bloodwork and CT scan on 11/28/13.   Patient denies being on any new medicines. She denies any change in her diet denies fever chills sweats nausea vomiting on today's visit but she does states she's had intermittent nausea throughout the week along with intermittent abdominal pain. No bloody stools. Has never had pancreatitis before. She does not smoke.  CT exam as well as blood work was reviewed with patient.  Review of Systems See above.    Objective:   Physical Exam Neck no masses eardrums normal makes membranes moist Heart is regular Lungs are clear Abdomen soft subjective discomfort midabdomen.       Assessment & Plan:  1. Pancreatitis, acute We will recheck this lab work. May need further intervention. Currently right now bland diet await lab results - CBC with Differential - Lipase - Amylase  Patient was told if she starts having severe pain vomiting fever chills bloody stools she needs immediately go to the ER. If her lab work is abnormal we will need to do followup lab work as long as it's not severely elevated she will continue on a bland diet and probably have to get ultrasound as well. May need referral to gastroenterology.

## 2013-12-06 ENCOUNTER — Ambulatory Visit (HOSPITAL_COMMUNITY)
Admission: RE | Admit: 2013-12-06 | Discharge: 2013-12-06 | Disposition: A | Discharge status: Home / Self Care | Payer: Medicare Other | Source: Ambulatory Visit | Attending: Family Medicine | Admitting: Family Medicine

## 2013-12-06 DIAGNOSIS — R109 Unspecified abdominal pain: Secondary | ICD-10-CM | POA: Insufficient documentation

## 2013-12-06 DIAGNOSIS — K859 Acute pancreatitis without necrosis or infection, unspecified: Secondary | ICD-10-CM | POA: Diagnosis not present

## 2013-12-06 DIAGNOSIS — R935 Abnormal findings on diagnostic imaging of other abdominal regions, including retroperitoneum: Secondary | ICD-10-CM | POA: Diagnosis not present

## 2013-12-07 NOTE — Progress Notes (Signed)
Patient notified and verbalized understanding of the test results. No further questions. 

## 2013-12-11 ENCOUNTER — Encounter: Payer: Self-pay | Admitting: Family Medicine

## 2013-12-11 ENCOUNTER — Ambulatory Visit (INDEPENDENT_AMBULATORY_CARE_PROVIDER_SITE_OTHER): Payer: Medicare Other | Admitting: Family Medicine

## 2013-12-11 VITALS — BP 138/90 | Ht 68.0 in | Wt 225.0 lb

## 2013-12-11 DIAGNOSIS — K859 Acute pancreatitis without necrosis or infection, unspecified: Secondary | ICD-10-CM | POA: Insufficient documentation

## 2013-12-11 LAB — CBC WITH DIFFERENTIAL/PLATELET
Basophils Absolute: 0.1 10*3/uL (ref 0.0–0.1)
Basophils Relative: 1 % (ref 0–1)
Eosinophils Absolute: 0.3 10*3/uL (ref 0.0–0.7)
Eosinophils Relative: 3 % (ref 0–5)
HCT: 37.7 % (ref 36.0–46.0)
Hemoglobin: 12.7 g/dL (ref 12.0–15.0)
Lymphocytes Relative: 29 % (ref 12–46)
Lymphs Abs: 2.6 10*3/uL (ref 0.7–4.0)
MCH: 31.1 pg (ref 26.0–34.0)
MCHC: 33.7 g/dL (ref 30.0–36.0)
MCV: 92.2 fL (ref 78.0–100.0)
Monocytes Absolute: 0.8 10*3/uL (ref 0.1–1.0)
Monocytes Relative: 9 % (ref 3–12)
Neutro Abs: 5.3 10*3/uL (ref 1.7–7.7)
Neutrophils Relative %: 58 % (ref 43–77)
Platelets: 286 10*3/uL (ref 150–400)
RBC: 4.09 MIL/uL (ref 3.87–5.11)
RDW: 12.8 % (ref 11.5–15.5)
WBC: 9.1 10*3/uL (ref 4.0–10.5)

## 2013-12-11 LAB — AMYLASE: Amylase: 38 U/L (ref 0–105)

## 2013-12-11 LAB — LIPASE: Lipase: 74 U/L (ref 0–75)

## 2013-12-11 NOTE — Progress Notes (Signed)
   Subjective:    Patient ID: Deanna Salinas, female    DOB: [birth year], 78 y.o.   MRN: [id number]  HPI Patient is here today for a f/u appt from 4/28 d/t acute pancreatitis.  She got blood work done. She was supposed to get a repeat BW done yesterday, but she went to the hospital and not [facility].  She no longer has the abdominal pain. She does feel some discomfort, but not pain like before.     Review of Systems She relates her abdomen is not as painful but still having some soreness it did wake her up last night    Objective:   Physical Exam Abdomen mildly tender lungs clear hearts regular pulse normal extremities no edema       Assessment & Plan:  Pancreatitis-she will do her lab work today the enzymes are still elevated referral to gastroenterology. If enzymes look good we will recommend to give this one more week if pain not gone away by next week may need GI referral. Patient may also end up needing to have MRI/MRCP  Her CT scan and ultrasound did not show any tumor but certainly these symptoms if they persist need further looking into by a specialist

## 2014-02-13 ENCOUNTER — Encounter: Payer: Self-pay | Admitting: Family Medicine

## 2014-02-13 ENCOUNTER — Ambulatory Visit (INDEPENDENT_AMBULATORY_CARE_PROVIDER_SITE_OTHER): Payer: Medicare Other | Admitting: Family Medicine

## 2014-02-13 VITALS — BP 130/80 | Temp 98.4°F | Ht 68.0 in | Wt 224.1 lb

## 2014-02-13 DIAGNOSIS — K859 Acute pancreatitis without necrosis or infection, unspecified: Secondary | ICD-10-CM | POA: Diagnosis not present

## 2014-02-13 DIAGNOSIS — R1013 Epigastric pain: Secondary | ICD-10-CM | POA: Diagnosis not present

## 2014-02-13 LAB — CBC WITH DIFFERENTIAL/PLATELET
Basophils Absolute: 0 10*3/uL (ref 0.0–0.1)
Basophils Relative: 0 % (ref 0–1)
Eosinophils Absolute: 0.2 10*3/uL (ref 0.0–0.7)
Eosinophils Relative: 2 % (ref 0–5)
HCT: 37.7 % (ref 36.0–46.0)
Hemoglobin: 12.8 g/dL (ref 12.0–15.0)
Lymphocytes Relative: 29 % (ref 12–46)
Lymphs Abs: 3.3 10*3/uL (ref 0.7–4.0)
MCH: 31 pg (ref 26.0–34.0)
MCHC: 34 g/dL (ref 30.0–36.0)
MCV: 91.3 fL (ref 78.0–100.0)
Monocytes Absolute: 0.7 10*3/uL (ref 0.1–1.0)
Monocytes Relative: 6 % (ref 3–12)
Neutro Abs: 7.2 10*3/uL (ref 1.7–7.7)
Neutrophils Relative %: 63 % (ref 43–77)
Platelets: 295 10*3/uL (ref 150–400)
RBC: 4.13 MIL/uL (ref 3.87–5.11)
RDW: 13.2 % (ref 11.5–15.5)
WBC: 11.4 10*3/uL — ABNORMAL HIGH (ref 4.0–10.5)

## 2014-02-13 MED ORDER — HYDROCODONE-ACETAMINOPHEN 5-325 MG PO TABS: 1.0000 | ORAL_TABLET | Freq: Four times a day (QID) | ORAL | Status: DC | PRN

## 2014-02-13 NOTE — Progress Notes (Signed)
   Subjective:    Patient ID: Deanna Salinas, female    DOB: 1936/01/27, 78 y.o.   MRN: [id number]  Abdominal Pain This is a recurrent problem. The current episode started more than 1 month ago. The onset quality is gradual. The problem occurs intermittently. The problem has been unchanged. The pain is located in the generalized abdominal region. The pain is at a severity of 10/10. The pain is moderate. The abdominal pain radiates to the back. Associated symptoms include headaches and vomiting. Nothing aggravates the pain. The pain is relieved by nothing. She has tried nothing for the symptoms. The treatment provided no relief. Prior diagnostic workup includes CT scan. Her past medical history is significant for pancreatitis.   Patient has no other concerns at this time.    Review of Systems  Gastrointestinal: Positive for vomiting and abdominal pain.  Neurological: Positive for headaches.   She relates the pain has been hitting her intermittently but over the past week has been worse and at nighttime it does awaken her    Objective:   Physical Exam  Her lungs are clear hearts regular abdomen there is no guarding rebound or tenderness. She does have some slight subjective discomfort in the epigastrium region this patient needs referral to gastroenterology she may well need MRCP she might need ERCP. It is also possible that she may end up having to have gastric biopsies as well. She is maintaining her weight.      Assessment & Plan:  Epigastric pain see discussion above, labs ordered, gastroenterology paged. Will call their office in the morning to speak with one of the providers. Unable to connect with anyone currently. She was also prescribed hydrocodone for pain  Possible return of pancreatitis. See discussion above.  Of note the her lab results came back showing no pancreatitis. I talked with the patient we stopped all NSAIDs. Started PPI. Referred patient to gastroenterology who will  see her next week in more than likely will need EGD at that time with possible biopsies. 25 minutes spent with the patient throughout the course of the care

## 2014-02-14 ENCOUNTER — Other Ambulatory Visit: Payer: Self-pay | Admitting: Family Medicine

## 2014-02-14 ENCOUNTER — Telehealth: Payer: Self-pay | Admitting: Gastroenterology

## 2014-02-14 DIAGNOSIS — R1013 Epigastric pain: Secondary | ICD-10-CM

## 2014-02-14 LAB — BASIC METABOLIC PANEL
BUN: 19 mg/dL (ref 6–23)
CO2: 30 mEq/L (ref 19–32)
Calcium: 9.7 mg/dL (ref 8.4–10.5)
Chloride: 100 mEq/L (ref 96–112)
Creat: 0.89 mg/dL (ref 0.50–1.10)
Glucose, Bld: 114 mg/dL — ABNORMAL HIGH (ref 70–99)
Potassium: 5.2 mEq/L (ref 3.5–5.3)
Sodium: 139 mEq/L (ref 135–145)

## 2014-02-14 LAB — HEPATIC FUNCTION PANEL
ALT: 14 U/L (ref 0–35)
AST: 14 U/L (ref 0–37)
Albumin: 4.1 g/dL (ref 3.5–5.2)
Alkaline Phosphatase: 87 U/L (ref 39–117)
Bilirubin, Direct: 0.1 mg/dL (ref 0.0–0.3)
Indirect Bilirubin: 0.4 mg/dL (ref 0.2–1.2)
Total Bilirubin: 0.5 mg/dL (ref 0.2–1.2)
Total Protein: 6.7 g/dL (ref 6.0–8.3)

## 2014-02-14 LAB — LIPASE: Lipase: 51 U/L (ref 0–75)

## 2014-02-14 MED ORDER — PANTOPRAZOLE SODIUM 40 MG PO TBEC
40.0000 mg | DELAYED_RELEASE_TABLET | Freq: Every day | ORAL | Status: DC
Start: 2014-02-14 — End: 2014-09-12

## 2014-02-14 NOTE — Progress Notes (Signed)
Dr. Nicki Reaper is speaking with Deanna Salinas now on the phone.

## 2014-02-14 NOTE — Telephone Encounter (Signed)
Thank you :)

## 2014-02-14 NOTE — Telephone Encounter (Signed)
Spoke with Dr. Sallee Lange regarding patient. She needs urgent OV with [place] for intermittent epigastric pain for four week, recent elevation of amylase/lipase.  Let's ask her to come in on Monday 02/18/14 with me. Please ask her to arrive at 1245 for a 1pm appointment.

## 2014-02-14 NOTE — Telephone Encounter (Signed)
Pt is aware of OV and to arrive at 1245

## 2014-02-14 NOTE — Addendum Note (Signed)
Addended by: Dairl Ponder on: 02/14/2014 03:33 PM   Modules accepted: Orders

## 2014-02-15 LAB — TISSUE TRANSGLUTAMINASE, IGA: Tissue Transglutaminase Ab, IgA: 1 U/mL (ref <20)

## 2014-02-18 ENCOUNTER — Ambulatory Visit (INDEPENDENT_AMBULATORY_CARE_PROVIDER_SITE_OTHER): Payer: Medicare Other | Admitting: Gastroenterology

## 2014-02-18 ENCOUNTER — Encounter: Payer: Self-pay | Admitting: Gastroenterology

## 2014-02-18 VITALS — BP 132/88 | HR 78 | Temp 97.6°F | Resp 18 | Ht 68.0 in | Wt 221.0 lb

## 2014-02-18 DIAGNOSIS — R748 Abnormal levels of other serum enzymes: Secondary | ICD-10-CM | POA: Insufficient documentation

## 2014-02-18 DIAGNOSIS — R1013 Epigastric pain: Secondary | ICD-10-CM | POA: Diagnosis not present

## 2014-02-18 DIAGNOSIS — K859 Acute pancreatitis without necrosis or infection, unspecified: Secondary | ICD-10-CM | POA: Diagnosis not present

## 2014-02-18 NOTE — Assessment & Plan Note (Signed)
78 year old lady who presents with history of stuttering abdominal pain which is been more progressive over the past couple months. Pain predominantly in the left mid to left upper quadrant/epigastrium. Associated with nausea and vomiting at times. Initial workup in April suggested acute pancreatitis with elevated lipase although was minimally elevated. She also had mild leukocytosis along with this. CT and abdominal ultrasound unrevealing with regards to her abdominal pain or pancreatitis. Subsequent labs revealed elevated lipase on a couple occasions again minimally and subsequently normal last week. Her LFTs have remained normal throughout. Notably patient had exacerbation of pain after starting naproxen in April and was also diclofenac at some point recently. Her pain has now resolved after starting PPI therapy and avoiding NSAIDs over the past 5 days. Discussed with her at length today. Suspect she had peptic ulcer disease or gastritis from NSAID use. At this time it is not clear whether or not she has had acute pancreatitis with minimally elevated lipase which might be nonspecific in the setting. Recommend upper endoscopy to help sort all this out. Based on findings she still may need to have an MRCP. For now she will avoid NSAIDs. Continue PPI therapy. EGD hopefully this week.  I have discussed the risks, alternatives, benefits with regards to but not limited to the risk of reaction to medication, bleeding, infection, perforation and the patient is agreeable to proceed. Written consent to be obtained.

## 2014-02-18 NOTE — Patient Instructions (Signed)
1. Upper endoscopy as planned. See separate instructions.

## 2014-02-18 NOTE — Progress Notes (Signed)
Primary Care Physician:  Sallee Lange, MD  Primary Gastroenterologist:  Garfield Cornea, MD   Chief Complaint  Patient presents with  . Office Visit    HPI:  Deanna Salinas is a 78 y.o. female here for further evaluation of abdominal pain at the request of Dr. Sallee Lange. Urgent office visit provided after receiving a phone call from Dr. Wolfgang Phoenix last week concerning patient. She has had abdominal pain pretty persistently since April. Initially seen on April 22. Notably she had been prescribed naproxen for right trapezius pain about 2 weeks prior. However patient did report having abdominal pain off and on for about a year but more persistent for 2 months. Initial workup included a lipase which was abnormal and 98, LFTs normal, white blood cell count elevated at 11,600. CT abdomen pelvis at that time but no evidence of acute intra-abdominal abnormalities. Labs a week later with a lipase of 81, white blood cell count 12,300. On May 5 her lipase was normal at 74. Her abdominal pain resolved for a period time but started back about a month later and has been persistent since that time. She describes crampy-like pain predominantly in the left mid to upper abdomen. After a couple hours she'll notice pain into her back. Associated with vomiting on occasion. Not really affected by meals. Has woken her up from a deep sleep on several occasions. Really denies any heartburn. Bowel movements continue to be soft but are less frequent than usual. No melena rectal bleeding.  Last week seen by PCP. Advised to stop all NSAIDs (diclofenac the latest one). Pantoprazole started. States that her abdominal pain has resolved. She has been on a bland, soft diet.   She did have labs repeated last week with a white count of 11,400, hemoglobin 12.8, LFTs were normal, lipase 51, TTG negative.    Current Outpatient Prescriptions  Medication Sig Dispense Refill  . amitriptyline (ELAVIL) 50 MG tablet Take 50 mg by mouth at  bedtime as needed for sleep.      . eszopiclone (LUNESTA) 2 MG TABS tablet TAKE (1) TABLET BY MOUTH AT BEDTIME.  30 tablet  4  . HYDROcodone-acetaminophen (NORCO/VICODIN) 5-325 MG per tablet Take 1 tablet by mouth every 6 (six) hours as needed.  30 tablet  0  . pantoprazole (PROTONIX) 40 MG tablet Take 1 tablet (40 mg total) by mouth daily.  30 tablet  1  . quinapril (ACCUPRIL) 20 MG tablet Take 1 tablet (20 mg total) by mouth 2 (two) times daily.  180 tablet  1   No current facility-administered medications for this visit.    Allergies as of 02/18/2014 - Review Complete 02/18/2014  Allergen Reaction Noted  . Ambien [zolpidem tartrate]  12/09/2012  . Lipitor [atorvastatin]  12/09/2012  . Pravastatin  12/09/2012  . Trazodone and nefazodone  12/09/2012    Past Medical History  Diagnosis Date  . Hypertension   . Hyperlipidemia   . Hyperglycemia   . Fatty liver   . Diabetes mellitus without complication     Past Surgical History  Procedure Laterality Date  . Cesarean section      X5  . Colonoscopy    . Colonoscopy N/A 06/11/2013    colonic diverticulosis, hyperplastic polyps. no further screening colonoscopies recommended  . Appendectomy      at the time of hysterectomy    Family History  Problem Relation Age of Onset  . Hypertension Father   . Hypertension Other   . Hypertension Other   .  Colon cancer Neg Hx     History   Social History  . Marital Status: Married    Spouse Name: N/A    Number of Children: 88  . Years of Education: N/A   Occupational History  .     Social History Main Topics  . Smoking status: Never Smoker   . Smokeless tobacco: Not on file  . Alcohol Use: 1.8 oz/week    3 Glasses of wine per week     Comment: occasional  . Drug Use: No  . Sexual Activity: Not on file   Other Topics Concern  . Not on file   Social History Narrative  . No narrative on file      ROS:  General: Negative for anorexia, weight loss, fever, chills,  fatigue, weakness. Eyes: Negative for vision changes.  ENT: Negative for hoarseness, difficulty swallowing , nasal congestion. CV: Negative for chest pain, angina, palpitations, dyspnea on exertion, peripheral edema.  Respiratory: Negative for dyspnea at rest, dyspnea on exertion, cough, sputum, wheezing.  GI: See history of present illness. GU:  Negative for dysuria, hematuria, urinary incontinence, urinary frequency, nocturnal urination.  MS: Negative for joint pain, low back pain.  Derm: Negative for rash or itching.  Neuro: Negative for weakness, abnormal sensation, seizure, frequent headaches, memory loss, confusion.  Psych: Negative for anxiety, depression, suicidal ideation, hallucinations.  Endo: Negative for unusual weight change.  Heme: Negative for bruising or bleeding. Allergy: Negative for rash or hives.    Physical Examination:  BP 132/88  Pulse 78  Temp(Src) 97.6 F (36.4 C) (Oral)  Resp 18  Ht 5\' 8"  (1.727 m)  Wt 221 lb (100.245 kg)  BMI 33.61 kg/m2   General: Well-nourished, well-developed in no acute distress.  Head: Normocephalic, atraumatic.   Eyes: Conjunctiva pink, no icterus. Mouth: Oropharyngeal mucosa moist and pink , no lesions erythema or exudate. Neck: Supple without thyromegaly, masses, or lymphadenopathy.  Lungs: Clear to auscultation bilaterally.  Heart: Regular rate and rhythm, no murmurs rubs or gallops.  Abdomen: Bowel sounds are normal, minimal tenderness, nondistended, no hepatosplenomegaly or masses, no abdominal bruits or    hernia , no rebound or guarding.   Rectal: not performed Extremities: No lower extremity edema. No clubbing or deformities.  Neuro: Alert and oriented x 4 , grossly normal neurologically.  Skin: Warm and dry, no rash or jaundice.   Psych: Alert and cooperative, normal mood and affect.  Labs: Lab Results  Component Value Date   WBC 11.4* 02/13/2014   HGB 12.8 02/13/2014   HCT 37.7 02/13/2014   MCV 91.3 02/13/2014   PLT  295 02/13/2014   Lab Results  Component Value Date   CREATININE 0.89 02/13/2014   BUN 19 02/13/2014   NA 139 02/13/2014   K 5.2 02/13/2014   CL 100 02/13/2014   CO2 30 02/13/2014   Lab Results  Component Value Date   ALT 14 02/13/2014   AST 14 02/13/2014   ALKPHOS 87 02/13/2014   BILITOT 0.5 02/13/2014   Lab Results  Component Value Date   LIPASE 51 02/13/2014   Component     Latest Ref Rng 11/28/2013 12/04/2013 12/11/2013 02/13/2014  Lipase     0 - 75 U/L 98 (H) 81 (H) 74 51    Imaging Studies: Abd u/s 12/07/13 IMPRESSION:  No explanation for patient's abdominal pain and pancreatitis.  Specifically, no evidence of cholelithiasis.  CT A/P W contrast 11/29/13 IMPRESSION:  1. Dystrophic calcifications within the adrenals consistent with  prior adrenal and solid hemorrhagic versus infectious.  2. Bosniak type 1 cyst right kidney  3. No evidence of focal acute intra-abdominal or intrapelvic  abnormalities.  4. Findings within the sacrum and coccyx with differential  considerations of Paget's disease, in the absence of known  malignancy. Correlation with plain film evaluation recommended.

## 2014-02-19 NOTE — Progress Notes (Signed)
cc'd to pcp 

## 2014-02-20 ENCOUNTER — Ambulatory Visit (HOSPITAL_COMMUNITY)
Admission: RE | Admit: 2014-02-20 | Discharge: 2014-02-20 | Disposition: A | Discharge status: Home / Self Care | Payer: Medicare Other | Source: Ambulatory Visit | Attending: Internal Medicine | Admitting: Internal Medicine

## 2014-02-20 ENCOUNTER — Encounter (HOSPITAL_COMMUNITY)
Admission: RE | Disposition: A | Discharge status: Home / Self Care | Payer: Self-pay | Source: Ambulatory Visit | Attending: Internal Medicine

## 2014-02-20 DIAGNOSIS — F17200 Nicotine dependence, unspecified, uncomplicated: Secondary | ICD-10-CM | POA: Diagnosis not present

## 2014-02-20 DIAGNOSIS — K2940 Chronic atrophic gastritis without bleeding: Secondary | ICD-10-CM | POA: Diagnosis not present

## 2014-02-20 DIAGNOSIS — R109 Unspecified abdominal pain: Secondary | ICD-10-CM | POA: Insufficient documentation

## 2014-02-20 DIAGNOSIS — K279 Peptic ulcer, site unspecified, unspecified as acute or chronic, without hemorrhage or perforation: Secondary | ICD-10-CM

## 2014-02-20 DIAGNOSIS — K3189 Other diseases of stomach and duodenum: Secondary | ICD-10-CM | POA: Diagnosis present

## 2014-02-20 DIAGNOSIS — Z79899 Other long term (current) drug therapy: Secondary | ICD-10-CM | POA: Insufficient documentation

## 2014-02-20 DIAGNOSIS — K2970 Gastritis, unspecified, without bleeding: Secondary | ICD-10-CM | POA: Diagnosis not present

## 2014-02-20 DIAGNOSIS — K277 Chronic peptic ulcer, site unspecified, without hemorrhage or perforation: Secondary | ICD-10-CM | POA: Diagnosis not present

## 2014-02-20 DIAGNOSIS — K259 Gastric ulcer, unspecified as acute or chronic, without hemorrhage or perforation: Secondary | ICD-10-CM | POA: Diagnosis not present

## 2014-02-20 DIAGNOSIS — E785 Hyperlipidemia, unspecified: Secondary | ICD-10-CM | POA: Diagnosis not present

## 2014-02-20 DIAGNOSIS — I10 Essential (primary) hypertension: Secondary | ICD-10-CM | POA: Diagnosis not present

## 2014-02-20 DIAGNOSIS — R1013 Epigastric pain: Secondary | ICD-10-CM | POA: Diagnosis present

## 2014-02-20 DIAGNOSIS — E119 Type 2 diabetes mellitus without complications: Secondary | ICD-10-CM | POA: Insufficient documentation

## 2014-02-20 DIAGNOSIS — K269 Duodenal ulcer, unspecified as acute or chronic, without hemorrhage or perforation: Secondary | ICD-10-CM | POA: Diagnosis not present

## 2014-02-20 DIAGNOSIS — K2990 Gastroduodenitis, unspecified, without bleeding: Secondary | ICD-10-CM | POA: Diagnosis not present

## 2014-02-20 DIAGNOSIS — R7309 Other abnormal glucose: Secondary | ICD-10-CM | POA: Diagnosis not present

## 2014-02-20 HISTORY — PX: ESOPHAGOGASTRODUODENOSCOPY: SHX5428

## 2014-02-20 LAB — GLUCOSE, CAPILLARY: Glucose-Capillary: 152 mg/dL — ABNORMAL HIGH (ref 70–99)

## 2014-02-20 SURGERY — EGD (ESOPHAGOGASTRODUODENOSCOPY)
Anesthesia: Moderate Sedation

## 2014-02-20 MED ORDER — MIDAZOLAM HCL 5 MG/5ML IJ SOLN
INTRAMUSCULAR | Status: AC
  Filled 2014-02-20: qty 10

## 2014-02-20 MED ORDER — LIDOCAINE VISCOUS 2 % MT SOLN
OROMUCOSAL | Status: DC | PRN
  Administered 2014-02-20 (×2): 2 mL via OROMUCOSAL

## 2014-02-20 MED ORDER — ONDANSETRON HCL 4 MG/2ML IJ SOLN
INTRAMUSCULAR | Status: AC
  Filled 2014-02-20: qty 2

## 2014-02-20 MED ORDER — MEPERIDINE HCL 100 MG/ML IJ SOLN
INTRAMUSCULAR | Status: AC
  Filled 2014-02-20: qty 2

## 2014-02-20 MED ORDER — MIDAZOLAM HCL 5 MG/5ML IJ SOLN
INTRAMUSCULAR | Status: DC | PRN
  Administered 2014-02-20: 2 mg via INTRAVENOUS
  Administered 2014-02-20 (×2): 1 mg via INTRAVENOUS

## 2014-02-20 MED ORDER — MEPERIDINE HCL 100 MG/ML IJ SOLN
INTRAMUSCULAR | Status: DC | PRN
  Administered 2014-02-20: 50 mg via INTRAVENOUS
  Administered 2014-02-20: 25 mg via INTRAVENOUS

## 2014-02-20 MED ORDER — LIDOCAINE VISCOUS 2 % MT SOLN
OROMUCOSAL | Status: AC
  Filled 2014-02-20: qty 15

## 2014-02-20 MED ORDER — ONDANSETRON HCL 4 MG/2ML IJ SOLN
INTRAMUSCULAR | Status: DC | PRN
  Administered 2014-02-20: 4 mg via INTRAVENOUS

## 2014-02-20 MED ORDER — STERILE WATER FOR IRRIGATION IR SOLN
Status: DC | PRN
  Administered 2014-02-20: 11:00:00

## 2014-02-20 NOTE — Interval H&P Note (Signed)
History and Physical Interval Note:  02/20/2014 11:01 AM  Deanna Salinas  has presented today for surgery, with the diagnosis of EPIGASTRIC PAIN, ELEVATED LIPASE  The various methods of treatment have been discussed with the patient and family. After consideration of risks, benefits and other options for treatment, the patient has consented to  Procedure(s) with comments: ESOPHAGOGASTRODUODENOSCOPY (EGD) (N/A) - 11:00 as a surgical intervention .  The patient's history has been reviewed, patient examined, no change in status, stable for surgery.  I have reviewed the patient's chart and labs.  Questions were answered to the patient's satisfaction.     Deanna Salinas  Abdominal pain has steadily improved since addition of Protonix and withdrawal of nonsteroidal agents. EGD per plan. The risks, benefits, limitations, alternatives and imponderables have been reviewed with the patient. Potential for esophageal dilation, biopsy, etc. have also been reviewed.  Questions have been answered. All parties agreeable.

## 2014-02-20 NOTE — H&P (View-Only) (Signed)
Primary Care Physician:  Sallee Lange, MD  Primary Gastroenterologist:  Garfield Cornea, MD   Chief Complaint  Patient presents with  . Office Visit    HPI:  Deanna Salinas is a 78 y.o. female here for further evaluation of abdominal pain at the request of Dr. Sallee Lange. Urgent office visit provided after receiving a phone call from Dr. Wolfgang Phoenix last week concerning patient. She has had abdominal pain pretty persistently since April. Initially seen on April 22. Notably she had been prescribed naproxen for right trapezius pain about 2 weeks prior. However patient did report having abdominal pain off and on for about a year but more persistent for 2 months. Initial workup included a lipase which was abnormal and 98, LFTs normal, white blood cell count elevated at 11,600. CT abdomen pelvis at that time but no evidence of acute intra-abdominal abnormalities. Labs a week later with a lipase of 81, white blood cell count 12,300. On May 5 her lipase was normal at 74. Her abdominal pain resolved for a period time but started back about a month later and has been persistent since that time. She describes crampy-like pain predominantly in the left mid to upper abdomen. After a couple hours she'll notice pain into her back. Associated with vomiting on occasion. Not really affected by meals. Has woken her up from a deep sleep on several occasions. Really denies any heartburn. Bowel movements continue to be soft but are less frequent than usual. No melena rectal bleeding.  Last week seen by PCP. Advised to stop all NSAIDs (diclofenac the latest one). Pantoprazole started. States that her abdominal pain has resolved. She has been on a bland, soft diet.   She did have labs repeated last week with a white count of 11,400, hemoglobin 12.8, LFTs were normal, lipase 51, TTG negative.    Current Outpatient Prescriptions  Medication Sig Dispense Refill  . amitriptyline (ELAVIL) 50 MG tablet Take 50 mg by mouth at  bedtime as needed for sleep.      . eszopiclone (LUNESTA) 2 MG TABS tablet TAKE (1) TABLET BY MOUTH AT BEDTIME.  30 tablet  4  . HYDROcodone-acetaminophen (NORCO/VICODIN) 5-325 MG per tablet Take 1 tablet by mouth every 6 (six) hours as needed.  30 tablet  0  . pantoprazole (PROTONIX) 40 MG tablet Take 1 tablet (40 mg total) by mouth daily.  30 tablet  1  . quinapril (ACCUPRIL) 20 MG tablet Take 1 tablet (20 mg total) by mouth 2 (two) times daily.  180 tablet  1   No current facility-administered medications for this visit.    Allergies as of 02/18/2014 - Review Complete 02/18/2014  Allergen Reaction Noted  . Ambien [zolpidem tartrate]  12/09/2012  . Lipitor [atorvastatin]  12/09/2012  . Pravastatin  12/09/2012  . Trazodone and nefazodone  12/09/2012    Past Medical History  Diagnosis Date  . Hypertension   . Hyperlipidemia   . Hyperglycemia   . Fatty liver   . Diabetes mellitus without complication     Past Surgical History  Procedure Laterality Date  . Cesarean section      X5  . Colonoscopy    . Colonoscopy N/A 06/11/2013    colonic diverticulosis, hyperplastic polyps. no further screening colonoscopies recommended  . Appendectomy      at the time of hysterectomy    Family History  Problem Relation Age of Onset  . Hypertension Father   . Hypertension Other   . Hypertension Other   .  Colon cancer Neg Hx     History   Social History  . Marital Status: Married    Spouse Name: N/A    Number of Children: 92  . Years of Education: N/A   Occupational History  .     Social History Main Topics  . Smoking status: Never Smoker   . Smokeless tobacco: Not on file  . Alcohol Use: 1.8 oz/week    3 Glasses of wine per week     Comment: occasional  . Drug Use: No  . Sexual Activity: Not on file   Other Topics Concern  . Not on file   Social History Narrative  . No narrative on file      ROS:  General: Negative for anorexia, weight loss, fever, chills,  fatigue, weakness. Eyes: Negative for vision changes.  ENT: Negative for hoarseness, difficulty swallowing , nasal congestion. CV: Negative for chest pain, angina, palpitations, dyspnea on exertion, peripheral edema.  Respiratory: Negative for dyspnea at rest, dyspnea on exertion, cough, sputum, wheezing.  GI: See history of present illness. GU:  Negative for dysuria, hematuria, urinary incontinence, urinary frequency, nocturnal urination.  MS: Negative for joint pain, low back pain.  Derm: Negative for rash or itching.  Neuro: Negative for weakness, abnormal sensation, seizure, frequent headaches, memory loss, confusion.  Psych: Negative for anxiety, depression, suicidal ideation, hallucinations.  Endo: Negative for unusual weight change.  Heme: Negative for bruising or bleeding. Allergy: Negative for rash or hives.    Physical Examination:  BP 132/88  Pulse 78  Temp(Src) 97.6 F (36.4 C) (Oral)  Resp 18  Ht 5\' 8"  (1.727 m)  Wt 221 lb (100.245 kg)  BMI 33.61 kg/m2   General: Well-nourished, well-developed in no acute distress.  Head: Normocephalic, atraumatic.   Eyes: Conjunctiva pink, no icterus. Mouth: Oropharyngeal mucosa moist and pink , no lesions erythema or exudate. Neck: Supple without thyromegaly, masses, or lymphadenopathy.  Lungs: Clear to auscultation bilaterally.  Heart: Regular rate and rhythm, no murmurs rubs or gallops.  Abdomen: Bowel sounds are normal, minimal tenderness, nondistended, no hepatosplenomegaly or masses, no abdominal bruits or    hernia , no rebound or guarding.   Rectal: not performed Extremities: No lower extremity edema. No clubbing or deformities.  Neuro: Alert and oriented x 4 , grossly normal neurologically.  Skin: Warm and dry, no rash or jaundice.   Psych: Alert and cooperative, normal mood and affect.  Labs: Lab Results  Component Value Date   WBC 11.4* 02/13/2014   HGB 12.8 02/13/2014   HCT 37.7 02/13/2014   MCV 91.3 02/13/2014   PLT  295 02/13/2014   Lab Results  Component Value Date   CREATININE 0.89 02/13/2014   BUN 19 02/13/2014   NA 139 02/13/2014   K 5.2 02/13/2014   CL 100 02/13/2014   CO2 30 02/13/2014   Lab Results  Component Value Date   ALT 14 02/13/2014   AST 14 02/13/2014   ALKPHOS 87 02/13/2014   BILITOT 0.5 02/13/2014   Lab Results  Component Value Date   LIPASE 51 02/13/2014   Component     Latest Ref Rng 11/28/2013 12/04/2013 12/11/2013 02/13/2014  Lipase     0 - 75 U/L 98 (H) 81 (H) 74 51    Imaging Studies: Abd u/s 12/07/13 IMPRESSION:  No explanation for patient's abdominal pain and pancreatitis.  Specifically, no evidence of cholelithiasis.  CT A/P W contrast 11/29/13 IMPRESSION:  1. Dystrophic calcifications within the adrenals consistent with  prior adrenal and solid hemorrhagic versus infectious.  2. Bosniak type 1 cyst right kidney  3. No evidence of focal acute intra-abdominal or intrapelvic  abnormalities.  4. Findings within the sacrum and coccyx with differential  considerations of Paget's disease, in the absence of known  malignancy. Correlation with plain film evaluation recommended.

## 2014-02-20 NOTE — Op Note (Addendum)
[facility] [address] [place], [address]   ENDOSCOPY PROCEDURE REPORT  PATIENT: Deanna Salinas, Deanna Salinas  MR#: [id number] BIRTHDATE: July 15, 1936 , 78  yrs. old GENDER: Female ENDOSCOPIST: R.  Garfield Cornea, MD FACP FACG REFERRED BY:  Sallee Lange, M.D. PROCEDURE DATE:  02/20/2014 PROCEDURE:     EGD with gastric biopsy  INDICATIONS:     Dyspepsia; much improved with cessation of NSAIDs and the addition of PPI therapy  INFORMED CONSENT:   The risks, benefits, limitations, alternatives and imponderables have been discussed.  The potential for biopsy, esophogeal dilation, etc. have also been reviewed.  Questions have been answered.  All parties agreeable.  Please see the history and physical in the medical record for more information.  MEDICATIONS:         Versed 4 mg IV and Demerol 25 mg IV in divided doses.  Xylocaine gel orally. Zofran 4 mg IV.  DESCRIPTION OF PROCEDURE:   The EG-[id number] ([id number])  endoscope was introduced through the mouth and advanced to the second portion of the duodenum without difficulty or limitations.  The mucosal surfaces were surveyed very carefully during advancement of the scope and upon withdrawal.  Retroflexion view of the proximal stomach and esophagogastric junction was performed.      FINDINGS:   Normal esophagus. (1) 7 mm gastric ulcer on the greater curvature your patient "satellite" erosions. The remainder of the gastric mucosa appeared normal. Patent pylorus. Examination of bulb and second portion revealed a large 1.5-1.8 cm bulbar ulcer involving most of the apex of the bulb. Please see the above. Had a clean base. Otherwise the first and second portion of the duodenum appeared normal.  THERAPEUTIC / DIAGNOSTIC MANEUVERS PERFORMED:  The gastric ulcer was biopsied.   COMPLICATIONS:  None  IMPRESSION:  Peptic ulcer disease. Significant gastric and duodenal ulceration -  status post gastric ulcer biopsy / likely  NSAID gastropathy. Will need to rule out H. pylori.  RECOMMENDATIONS:  Absolutely refrain from taking all nonsteroidal agents. Increase protonix to 40 mg twice daily. Followup on pathology. Plan for repeat EGD in 12 weeks.    _______________________________ R. Garfield Cornea, MD FACP [facility] eSigned:  R. Garfield Cornea, MD FACP [facility] 02/21/2014 8:36 AM Revised: 02/21/2014 8:36 AM    CC:

## 2014-02-20 NOTE — Discharge Instructions (Signed)
EGD Discharge instructions Please read the instructions outlined below and refer to this sheet in the next few weeks. These discharge instructions provide you with general information on caring for yourself after you leave the hospital. Your doctor may also give you specific instructions. While your treatment has been planned according to the most current medical practices available, unavoidable complications occasionally occur. If you have any problems or questions after discharge, please call your doctor. ACTIVITY  You may resume your regular activity but move at a slower pace for the next 24 hours.   Take frequent rest periods for the next 24 hours.   Walking will help expel (get rid of) the air and reduce the bloated feeling in your abdomen.   No driving for 24 hours (because of the anesthesia (medicine) used during the test).   You may shower.   Do not sign any important legal documents or operate any machinery for 24 hours (because of the anesthesia used during the test).  NUTRITION  Drink plenty of fluids.   You may resume your normal diet.   Begin with a light meal and progress to your normal diet.   Avoid alcoholic beverages for 24 hours or as instructed by your caregiver.  MEDICATIONS  You may resume your normal medications unless your caregiver tells you otherwise.  WHAT YOU CAN EXPECT TODAY  You may experience abdominal discomfort such as a feeling of fullness or gas pains.  FOLLOW-UP  Your doctor will discuss the results of your test with you.  SEEK IMMEDIATE MEDICAL ATTENTION IF ANY OF THE FOLLOWING OCCUR:  Excessive nausea (feeling sick to your stomach) and/or vomiting.   Severe abdominal pain and distention (swelling).   Trouble swallowing.   Temperature over 101 F (37.8 C).   Rectal bleeding or vomiting of blood.     Peptic ulcer disease information provided  Absolutely refrain from taking all forms of nonsteroidal agents like Naprosyn, ibuprofen  and Aleve  Increase Protonix to 40 mg twice daily  Office visit with [place] in 12 weeks. You will need a repeat EGD to verify ulcer healing at bedtime.  Further recommendations to follow pending review of pathology report

## 2014-02-21 ENCOUNTER — Telehealth: Payer: Self-pay | Admitting: Family Medicine

## 2014-02-21 NOTE — Telephone Encounter (Signed)
Discussed with pt. She is taking protonix 40mg  wants to know if she should increase to 2 q day instead of one.

## 2014-02-21 NOTE — Telephone Encounter (Signed)
She should see significant improvement over the next 4 weeks. No anti-inflammatories. Followup with me in 4 weeks. I would imagine that gastroenterology is also following her

## 2014-02-21 NOTE — Telephone Encounter (Signed)
Nurses,given what was found it will heal better with one 40 mg taken bid for 12 weeks (3 months) then can go to 1 qd, also follow  Up EGD in 12 weeks to verify complete healing would be wise.She should follow here in 1 month. Avoid all NSAIDS

## 2014-02-21 NOTE — Telephone Encounter (Signed)
Pt wants to know if she needs to make a appt following her  appt with her GI Doc, done on 7/15   They found two peptic ulcers

## 2014-02-22 ENCOUNTER — Encounter (HOSPITAL_COMMUNITY): Payer: Self-pay | Admitting: Internal Medicine

## 2014-02-22 ENCOUNTER — Encounter: Payer: Self-pay | Admitting: Internal Medicine

## 2014-02-22 NOTE — Telephone Encounter (Signed)
Patient notified and verbalized understanding. 

## 2014-02-27 NOTE — Progress Notes (Signed)
Discussed with Dr. Gala Romney. Patient's ulcer likely cause of minimally elevated lipase and no further work up pancreas needed.

## 2014-03-25 ENCOUNTER — Other Ambulatory Visit: Payer: Self-pay | Admitting: Family Medicine

## 2014-03-25 NOTE — Telephone Encounter (Signed)
Ok times 4 

## 2014-03-27 ENCOUNTER — Other Ambulatory Visit: Payer: Self-pay | Admitting: Family Medicine

## 2014-03-29 NOTE — Telephone Encounter (Signed)
Ok times 5  

## 2014-04-17 ENCOUNTER — Encounter: Payer: Self-pay | Admitting: Internal Medicine

## 2014-05-23 DIAGNOSIS — Z23 Encounter for immunization: Secondary | ICD-10-CM | POA: Diagnosis not present

## 2014-06-18 ENCOUNTER — Telehealth: Payer: Self-pay | Admitting: Family Medicine

## 2014-06-18 MED ORDER — ESZOPICLONE 3 MG PO TABS: 3.0000 mg | ORAL_TABLET | Freq: Every day | ORAL | Status: DC

## 2014-06-18 NOTE — Telephone Encounter (Signed)
eszopiclone (LUNESTA) 2 MG TABS tablet  Pt would like a refill on this med please but she states the 2 mg dose Really isn't working an would like an increase with the dosage this time please.  Advised she may need an appt before you'll increase the dose   [facility]   90 day supply

## 2014-06-18 NOTE — Telephone Encounter (Signed)
Patient notified

## 2014-06-18 NOTE — Telephone Encounter (Signed)
Next dose is 3 mg, Im ok with that , may help. Even with meds 40 % of adults suffer with insomnia

## 2014-07-08 ENCOUNTER — Other Ambulatory Visit: Payer: Self-pay | Admitting: *Deleted

## 2014-07-08 ENCOUNTER — Other Ambulatory Visit: Payer: Self-pay

## 2014-07-08 MED ORDER — QUINAPRIL HCL 20 MG PO TABS: 20.0000 mg | ORAL_TABLET | Freq: Two times a day (BID) | ORAL | Status: DC

## 2014-07-08 MED ORDER — AMITRIPTYLINE HCL 50 MG PO TABS: 50.0000 mg | ORAL_TABLET | Freq: Every evening | ORAL | Status: DC | PRN

## 2014-08-15 ENCOUNTER — Telehealth: Payer: Self-pay | Admitting: Family Medicine

## 2014-08-15 DIAGNOSIS — E785 Hyperlipidemia, unspecified: Secondary | ICD-10-CM

## 2014-08-15 DIAGNOSIS — E119 Type 2 diabetes mellitus without complications: Secondary | ICD-10-CM

## 2014-08-15 DIAGNOSIS — I10 Essential (primary) hypertension: Secondary | ICD-10-CM

## 2014-08-15 DIAGNOSIS — Z79899 Other long term (current) drug therapy: Secondary | ICD-10-CM

## 2014-08-15 NOTE — Telephone Encounter (Signed)
Met 7, lipid, liver, hemoglobin A1c, urine micro-protein

## 2014-08-15 NOTE — Telephone Encounter (Signed)
Needs bw orders for appt, call when sent  Last labs 02/13/14 CBC, BMP, Hep, Lipase, IgA

## 2014-08-15 NOTE — Telephone Encounter (Signed)
Patient notified

## 2014-09-02 DIAGNOSIS — Z79899 Other long term (current) drug therapy: Secondary | ICD-10-CM | POA: Diagnosis not present

## 2014-09-02 DIAGNOSIS — I10 Essential (primary) hypertension: Secondary | ICD-10-CM | POA: Diagnosis not present

## 2014-09-02 DIAGNOSIS — E785 Hyperlipidemia, unspecified: Secondary | ICD-10-CM | POA: Diagnosis not present

## 2014-09-02 DIAGNOSIS — E119 Type 2 diabetes mellitus without complications: Secondary | ICD-10-CM | POA: Diagnosis not present

## 2014-09-03 ENCOUNTER — Ambulatory Visit: Payer: Medicare Other | Admitting: Family Medicine

## 2014-09-03 LAB — HEPATIC FUNCTION PANEL
ALT: 13 U/L (ref 0–35)
AST: 15 U/L (ref 0–37)
Albumin: 4 g/dL (ref 3.5–5.2)
Alkaline Phosphatase: 79 U/L (ref 39–117)
Bilirubin, Direct: 0.1 mg/dL (ref 0.0–0.3)
Indirect Bilirubin: 0.3 mg/dL (ref 0.2–1.2)
Total Bilirubin: 0.4 mg/dL (ref 0.2–1.2)
Total Protein: 6.5 g/dL (ref 6.0–8.3)

## 2014-09-03 LAB — BASIC METABOLIC PANEL
BUN: 24 mg/dL — ABNORMAL HIGH (ref 6–23)
CO2: 29 mEq/L (ref 19–32)
Calcium: 9.5 mg/dL (ref 8.4–10.5)
Chloride: 103 mEq/L (ref 96–112)
Creat: 0.98 mg/dL (ref 0.50–1.10)
Glucose, Bld: 140 mg/dL — ABNORMAL HIGH (ref 70–99)
Potassium: 5 mEq/L (ref 3.5–5.3)
Sodium: 139 mEq/L (ref 135–145)

## 2014-09-03 LAB — LIPID PANEL
Cholesterol: 199 mg/dL (ref 0–200)
HDL: 47 mg/dL (ref >39)
LDL Cholesterol: 121 mg/dL — ABNORMAL HIGH (ref 0–99)
Total CHOL/HDL Ratio: 4.2 Ratio
Triglycerides: 155 mg/dL — ABNORMAL HIGH (ref <150)
VLDL: 31 mg/dL (ref 0–40)

## 2014-09-03 LAB — HEMOGLOBIN A1C
Hgb A1c MFr Bld: 6.5 % — ABNORMAL HIGH (ref <5.7)
Mean Plasma Glucose: 140 mg/dL — ABNORMAL HIGH (ref <117)

## 2014-09-03 LAB — MICROALBUMIN, URINE: Microalb, Ur: 0.5 mg/dL (ref <2.0)

## 2014-09-12 ENCOUNTER — Encounter: Payer: Self-pay | Admitting: Family Medicine

## 2014-09-12 ENCOUNTER — Ambulatory Visit (INDEPENDENT_AMBULATORY_CARE_PROVIDER_SITE_OTHER): Payer: Medicare Other | Admitting: Family Medicine

## 2014-09-12 VITALS — BP 108/70 | Ht 68.0 in | Wt 231.0 lb

## 2014-09-12 DIAGNOSIS — E785 Hyperlipidemia, unspecified: Secondary | ICD-10-CM | POA: Diagnosis not present

## 2014-09-12 DIAGNOSIS — Z23 Encounter for immunization: Secondary | ICD-10-CM | POA: Diagnosis not present

## 2014-09-12 DIAGNOSIS — Z1231 Encounter for screening mammogram for malignant neoplasm of breast: Secondary | ICD-10-CM

## 2014-09-12 DIAGNOSIS — D239 Other benign neoplasm of skin, unspecified: Secondary | ICD-10-CM

## 2014-09-12 DIAGNOSIS — I10 Essential (primary) hypertension: Secondary | ICD-10-CM | POA: Diagnosis not present

## 2014-09-12 DIAGNOSIS — G4700 Insomnia, unspecified: Secondary | ICD-10-CM | POA: Diagnosis not present

## 2014-09-12 DIAGNOSIS — Z13820 Encounter for screening for osteoporosis: Secondary | ICD-10-CM | POA: Diagnosis not present

## 2014-09-12 DIAGNOSIS — E119 Type 2 diabetes mellitus without complications: Secondary | ICD-10-CM

## 2014-09-12 DIAGNOSIS — D229 Melanocytic nevi, unspecified: Secondary | ICD-10-CM

## 2014-09-12 MED ORDER — QUINAPRIL HCL 20 MG PO TABS: 20.0000 mg | ORAL_TABLET | Freq: Two times a day (BID) | ORAL | Status: DC

## 2014-09-12 MED ORDER — METFORMIN HCL 500 MG PO TABS: ORAL_TABLET | ORAL | Status: DC

## 2014-09-12 MED ORDER — AMITRIPTYLINE HCL 50 MG PO TABS: 50.0000 mg | ORAL_TABLET | Freq: Every evening | ORAL | Status: DC | PRN

## 2014-09-12 MED ORDER — ESZOPICLONE 3 MG PO TABS: 3.0000 mg | ORAL_TABLET | Freq: Every day | ORAL | Status: DC

## 2014-09-12 MED ORDER — PRAVASTATIN SODIUM 10 MG PO TABS: 10.0000 mg | ORAL_TABLET | Freq: Every day | ORAL | Status: DC

## 2014-09-12 NOTE — Progress Notes (Signed)
   Subjective:    Patient ID: Deanna Salinas, female    DOB: [birth year], 79 y.o.   MRN: [id number]  Hypertension This is a chronic problem. The current episode started more than 1 year ago. The problem has been gradually improving since onset. The problem is controlled. Pertinent negatives include no chest pain or shortness of breath. There are no associated agents to hypertension. There are no known risk factors for coronary artery disease. Treatments tried: quinapril. The current treatment provides significant improvement. There are no compliance problems.    Patient states that she has a mole on her right arm she would like the doctor to look at.   Not sleeping well due to body pain  Review of Systems  Constitutional: Negative for activity change, appetite change and fatigue.  HENT: Negative for congestion.   Respiratory: Negative for cough and shortness of breath.   Cardiovascular: Negative for chest pain.  Gastrointestinal: Negative for abdominal pain.  Endocrine: Negative for polydipsia and polyphagia.  Genitourinary: Negative for frequency.  Neurological: Negative for weakness.  Psychiatric/Behavioral: Negative for confusion.       Objective:   Physical Exam  Constitutional: She appears well-nourished. No distress.  Cardiovascular: Normal rate, regular rhythm and normal heart sounds.   No murmur heard. Pulmonary/Chest: Effort normal and breath sounds normal. No respiratory distress.  Musculoskeletal: She exhibits no edema.  Lymphadenopathy:    She has no cervical adenopathy.  Neurological: She is alert. She exhibits normal muscle tone.  Psychiatric: Her behavior is normal.  Vitals reviewed.  Lab work was reviewed in detail       Assessment & Plan:  1. Essential hypertension Blood pressure under decent control continue current measures  2. Type 2 diabetes mellitus without complication N8M V6H slightly up metformin 500 mg one half twice a day if side effects notify  us  3. Hyperlipidemia LDL above goal pravastatin 10 mg daily recheck lipid liver in 48 weeks patient hesitant to be on medication she was assured that if she has any problems we will come off of it  4. Insomnia Insomnia chronic continue current medicines  5. Screening for osteoporosis Needs bone density - DG Bone Density  6. Visit for screening mammogram Needs mammogram - MM DIGITAL SCREENING BILATERAL  7. Need for vaccination Prevnar 13 - Pneumococcal conjugate vaccine 13-valent IM  8. Atypical nevi Doubt cancer needs referral to dermatology - Ambulatory referral to Dermatology  Patient does have muscle joint discomforts I recommend stretching Tylenol. Follow-up if ongoing troubles.  Greater than 25 minutes spent with patient

## 2014-09-19 ENCOUNTER — Ambulatory Visit (HOSPITAL_COMMUNITY)
Admission: RE | Admit: 2014-09-19 | Discharge: 2014-09-19 | Disposition: A | Discharge status: Home / Self Care | Payer: Medicare Other | Source: Ambulatory Visit | Attending: Family Medicine | Admitting: Family Medicine

## 2014-09-19 DIAGNOSIS — Z1231 Encounter for screening mammogram for malignant neoplasm of breast: Secondary | ICD-10-CM | POA: Diagnosis present

## 2014-09-19 DIAGNOSIS — L820 Inflamed seborrheic keratosis: Secondary | ICD-10-CM | POA: Diagnosis not present

## 2014-09-19 DIAGNOSIS — R208 Other disturbances of skin sensation: Secondary | ICD-10-CM | POA: Diagnosis not present

## 2014-09-19 DIAGNOSIS — D225 Melanocytic nevi of trunk: Secondary | ICD-10-CM | POA: Diagnosis not present

## 2014-09-24 ENCOUNTER — Other Ambulatory Visit (HOSPITAL_COMMUNITY): Payer: Medicare Other

## 2014-11-26 DIAGNOSIS — Z961 Presence of intraocular lens: Secondary | ICD-10-CM | POA: Diagnosis not present

## 2014-11-26 DIAGNOSIS — E119 Type 2 diabetes mellitus without complications: Secondary | ICD-10-CM | POA: Diagnosis not present

## 2014-11-26 LAB — HM DIABETES EYE EXAM

## 2014-11-28 ENCOUNTER — Encounter: Payer: Self-pay | Admitting: *Deleted

## 2014-12-02 ENCOUNTER — Other Ambulatory Visit: Payer: Self-pay | Admitting: Family Medicine

## 2015-03-03 ENCOUNTER — Other Ambulatory Visit: Payer: Self-pay | Admitting: Family Medicine

## 2015-03-04 ENCOUNTER — Other Ambulatory Visit: Payer: Self-pay | Admitting: Family Medicine

## 2015-03-04 NOTE — Telephone Encounter (Signed)
They had this +2 additional refills, sent notification to the patient follow-up this fall

## 2015-05-08 ENCOUNTER — Telehealth: Payer: Self-pay | Admitting: Family Medicine

## 2015-05-08 MED ORDER — GLUCOSE BLOOD VI STRP: ORAL_STRIP | Status: DC

## 2015-05-08 NOTE — Telephone Encounter (Signed)
Pt needs a refill on her diabetic test strips  Contour Next Test strips   [facility]/[place]  Please call pt when done

## 2015-05-08 NOTE — Telephone Encounter (Signed)
Rx sent electronically to pharmacy. Patient notified. 

## 2015-05-19 ENCOUNTER — Ambulatory Visit (INDEPENDENT_AMBULATORY_CARE_PROVIDER_SITE_OTHER): Payer: Medicare Other | Admitting: Family Medicine

## 2015-05-19 ENCOUNTER — Encounter: Payer: Self-pay | Admitting: Family Medicine

## 2015-05-19 ENCOUNTER — Telehealth: Payer: Self-pay | Admitting: Family Medicine

## 2015-05-19 VITALS — BP 132/86 | Ht 68.0 in | Wt 237.0 lb

## 2015-05-19 DIAGNOSIS — E119 Type 2 diabetes mellitus without complications: Secondary | ICD-10-CM | POA: Diagnosis not present

## 2015-05-19 DIAGNOSIS — D72829 Elevated white blood cell count, unspecified: Secondary | ICD-10-CM

## 2015-05-19 DIAGNOSIS — Z79899 Other long term (current) drug therapy: Secondary | ICD-10-CM | POA: Diagnosis not present

## 2015-05-19 DIAGNOSIS — E038 Other specified hypothyroidism: Secondary | ICD-10-CM

## 2015-05-19 DIAGNOSIS — M1712 Unilateral primary osteoarthritis, left knee: Secondary | ICD-10-CM | POA: Diagnosis not present

## 2015-05-19 DIAGNOSIS — E039 Hypothyroidism, unspecified: Secondary | ICD-10-CM

## 2015-05-19 DIAGNOSIS — G4700 Insomnia, unspecified: Secondary | ICD-10-CM | POA: Diagnosis not present

## 2015-05-19 MED ORDER — GLUCOSE BLOOD VI STRP: ORAL_STRIP | Status: DC

## 2015-05-19 MED ORDER — AMITRIPTYLINE HCL 50 MG PO TABS: ORAL_TABLET | ORAL | Status: DC

## 2015-05-19 NOTE — Telephone Encounter (Signed)
Left message on voicemail notifying patient script was sent to pharmacy.

## 2015-05-19 NOTE — Telephone Encounter (Signed)
glucose blood test strip  Please send this to [facility]

## 2015-05-19 NOTE — Progress Notes (Signed)
   Subjective:    Patient ID: Deanna Salinas, female    DOB: 1936-04-08, 79 y.o.   MRN: [id number]  HPIBilateral knee pain. Pain is worse in left knee. Swelling in left leg. Started over 1 year ago. Trouble sleeping  Because of leg pain.   Back pain. Ongoing. Son told pt that she needs physical therapy that her muscles needed strengthing.   Wheezing. Pt states her daughter told her she was wheezing but pt does not feel like she is wheezing.   Pt stopped taking metformin and pravastatin because of headaches and insomnia.   Pt declines flu vaccine today. States she read that seniors should wait until around thanksgiving to get flu vaccine.   Pt does water aerobics at [employer].  Patient does have diabetes but states she felt metformin was causing side effects so she stop it She felt pravastatin was causing aches and pains and headaches so she stopped that as well  Review of Systems Patient denies chest pain shortness of breath relates knee pain back pain relates insomnia denies being depressed denies wheezing relates some coughing    Objective:   Physical Exam  Lungs are clear no crackles no wheezes heart is regular pulse normal BP good abdomen is soft extremities no edema skin warm dry neurologic grossly normal severe osteoarthritis noted in the left knee  Greater than 25 minutes spent with patient discussing her symptoms entertaining questions leading out the rationale to the patient regarding treatment and diagnosis    Assessment & Plan:  Left knee pain-needs urgent referral to orthopedics. Would benefit from having injections. May end up at some point time meeting surgery but hopefully not  Patient has stopped recent use of metformin and pravastatin because of concern for side effects on follow-up she will need to have A1c checked. Lab work was ordered including A1c  Patient with history of leuko-cytosis we will check CBC May need hematology referral  Patient does have reflux issues  she was instructed to take her pantoprazole daily. This should allow her to take her arthritis medicine for her knees.  Severe insomnia have gone through multiple medications. Patient seems to tolerate Elavil well. It is acknowledged that there is some risk with that medicine but she is been on long-term and tolerated it well. We will increase from 50 mg to 100 mg. Patient follow-up in several weeks  Subclinical hypothyroidism check TSH follow-up accordingly

## 2015-05-20 LAB — HEPATIC FUNCTION PANEL
ALT: 29 IU/L (ref 0–32)
AST: 20 IU/L (ref 0–40)
Albumin: 4.3 g/dL (ref 3.5–4.8)
Alkaline Phosphatase: 100 IU/L (ref 39–117)
Bilirubin Total: 0.2 mg/dL (ref 0.0–1.2)
Bilirubin, Direct: 0.1 mg/dL (ref 0.00–0.40)
Total Protein: 7.1 g/dL (ref 6.0–8.5)

## 2015-05-20 LAB — CBC WITH DIFFERENTIAL/PLATELET
Basophils Absolute: 0.1 10*3/uL (ref 0.0–0.2)
Basos: 0 %
EOS (ABSOLUTE): 0.3 10*3/uL (ref 0.0–0.4)
Eos: 3 %
Hematocrit: 41.7 % (ref 34.0–46.6)
Hemoglobin: 13.8 g/dL (ref 11.1–15.9)
Immature Grans (Abs): 0 10*3/uL (ref 0.0–0.1)
Immature Granulocytes: 0 %
Lymphocytes Absolute: 3.8 10*3/uL — ABNORMAL HIGH (ref 0.7–3.1)
Lymphs: 33 %
MCH: 30.8 pg (ref 26.6–33.0)
MCHC: 33.1 g/dL (ref 31.5–35.7)
MCV: 93 fL (ref 79–97)
Monocytes Absolute: 0.9 10*3/uL (ref 0.1–0.9)
Monocytes: 8 %
Neutrophils Absolute: 6.2 10*3/uL (ref 1.4–7.0)
Neutrophils: 56 %
Platelets: 289 10*3/uL (ref 150–379)
RBC: 4.48 x10E6/uL (ref 3.77–5.28)
RDW: 13.2 % (ref 12.3–15.4)
WBC: 11.3 10*3/uL — ABNORMAL HIGH (ref 3.4–10.8)

## 2015-05-20 LAB — BASIC METABOLIC PANEL
BUN/Creatinine Ratio: 19 (ref 11–26)
BUN: 18 mg/dL (ref 8–27)
CO2: 26 mmol/L (ref 18–29)
Calcium: 9.8 mg/dL (ref 8.7–10.3)
Chloride: 99 mmol/L (ref 97–108)
Creatinine, Ser: 0.97 mg/dL (ref 0.57–1.00)
GFR calc Af Amer: 65 mL/min/{1.73_m2} (ref >59)
GFR calc non Af Amer: 57 mL/min/{1.73_m2} — ABNORMAL LOW (ref >59)
Glucose: 137 mg/dL — ABNORMAL HIGH (ref 65–99)
Potassium: 5.4 mmol/L — ABNORMAL HIGH (ref 3.5–5.2)
Sodium: 143 mmol/L (ref 134–144)

## 2015-05-20 LAB — T4: T4, Total: 7.8 ug/dL (ref 4.5–12.0)

## 2015-05-20 LAB — HEMOGLOBIN A1C
Est. average glucose Bld gHb Est-mCnc: 163 mg/dL
Hgb A1c MFr Bld: 7.3 % — ABNORMAL HIGH (ref 4.8–5.6)

## 2015-05-20 LAB — TSH: TSH: 4.2 u[IU]/mL (ref 0.450–4.500)

## 2015-05-22 DIAGNOSIS — M1712 Unilateral primary osteoarthritis, left knee: Secondary | ICD-10-CM | POA: Diagnosis not present

## 2015-06-02 NOTE — Addendum Note (Signed)
Addended by: Dairl Ponder on: 06/02/2015 01:37 PM   Modules accepted: Orders

## 2015-06-06 ENCOUNTER — Other Ambulatory Visit: Payer: Self-pay | Admitting: Family Medicine

## 2015-06-08 NOTE — Telephone Encounter (Signed)
May have this and 5 refills

## 2015-06-16 ENCOUNTER — Ambulatory Visit (INDEPENDENT_AMBULATORY_CARE_PROVIDER_SITE_OTHER): Payer: Medicare Other | Admitting: Family Medicine

## 2015-06-16 ENCOUNTER — Encounter: Payer: Self-pay | Admitting: Family Medicine

## 2015-06-16 VITALS — BP 112/84 | Temp 98.3°F | Ht 68.0 in | Wt 236.0 lb

## 2015-06-16 DIAGNOSIS — N181 Chronic kidney disease, stage 1: Secondary | ICD-10-CM | POA: Diagnosis not present

## 2015-06-16 DIAGNOSIS — E1122 Type 2 diabetes mellitus with diabetic chronic kidney disease: Secondary | ICD-10-CM

## 2015-06-16 DIAGNOSIS — I10 Essential (primary) hypertension: Secondary | ICD-10-CM

## 2015-06-16 DIAGNOSIS — G4700 Insomnia, unspecified: Secondary | ICD-10-CM | POA: Diagnosis not present

## 2015-06-16 DIAGNOSIS — E785 Hyperlipidemia, unspecified: Secondary | ICD-10-CM | POA: Diagnosis not present

## 2015-06-16 DIAGNOSIS — Z23 Encounter for immunization: Secondary | ICD-10-CM

## 2015-06-16 DIAGNOSIS — Z794 Long term (current) use of insulin: Secondary | ICD-10-CM

## 2015-06-16 DIAGNOSIS — E119 Type 2 diabetes mellitus without complications: Secondary | ICD-10-CM | POA: Insufficient documentation

## 2015-06-16 NOTE — Patient Instructions (Signed)
Diabetes Mellitus and Food It is important for you to manage your blood sugar (glucose) level. Your blood glucose level can be greatly affected by what you eat. Eating healthier foods in the appropriate amounts throughout the day at about the same time each day will help you control your blood glucose level. It can also help slow or prevent worsening of your diabetes mellitus. Healthy eating may even help you improve the level of your blood pressure and reach or maintain a healthy weight.  General recommendations for healthful eating and cooking habits include:  Eating meals and snacks regularly. Avoid going long periods of time without eating to lose weight.  Eating a diet that consists mainly of plant-based foods, such as fruits, vegetables, nuts, legumes, and whole grains.  Using low-heat cooking methods, such as baking, instead of high-heat cooking methods, such as deep frying. Work with your dietitian to make sure you understand how to use the Nutrition Facts information on food labels. HOW CAN FOOD AFFECT ME? Carbohydrates Carbohydrates affect your blood glucose level more than any other type of food. Your dietitian will help you determine how many carbohydrates to eat at each meal and teach you how to count carbohydrates. Counting carbohydrates is important to keep your blood glucose at a healthy level, especially if you are using insulin or taking certain medicines for diabetes mellitus. Alcohol Alcohol can cause sudden decreases in blood glucose (hypoglycemia), especially if you use insulin or take certain medicines for diabetes mellitus. Hypoglycemia can be a life-threatening condition. Symptoms of hypoglycemia (sleepiness, dizziness, and disorientation) are similar to symptoms of having too much alcohol.  If your health care provider has given you approval to drink alcohol, do so in moderation and use the following guidelines:  Women should not have more than one drink per day, and men  should not have more than two drinks per day. One drink is equal to:  12 oz of beer.  5 oz of wine.  1 oz of hard liquor.  Do not drink on an empty stomach.  Keep yourself hydrated. Have water, diet soda, or unsweetened iced tea.  Regular soda, juice, and other mixers might contain a lot of carbohydrates and should be counted. WHAT FOODS ARE NOT RECOMMENDED? As you make food choices, it is important to remember that all foods are not the same. Some foods have fewer nutrients per serving than other foods, even though they might have the same number of calories or carbohydrates. It is difficult to get your body what it needs when you eat foods with fewer nutrients. Examples of foods that you should avoid that are high in calories and carbohydrates but low in nutrients include:  Trans fats (most processed foods list trans fats on the Nutrition Facts label).  Regular soda.  Juice.  Candy.  Sweets, such as cake, pie, doughnuts, and cookies.  Fried foods. WHAT FOODS CAN I EAT? Eat nutrient-rich foods, which will nourish your body and keep you healthy. The food you should eat also will depend on several factors, including:  The calories you need.  The medicines you take.  Your weight.  Your blood glucose level.  Your blood pressure level.  Your cholesterol level. You should eat a variety of foods, including:  Protein.  Lean cuts of meat.  Proteins low in saturated fats, such as fish, egg whites, and beans. Avoid processed meats.  Fruits and vegetables.  Fruits and vegetables that may help control blood glucose levels, such as apples, mangoes, and   yams.  Dairy products.  Choose fat-free or low-fat dairy products, such as milk, yogurt, and cheese.  Grains, bread, pasta, and rice.  Choose whole grain products, such as multigrain bread, whole oats, and brown rice. These foods may help control blood pressure.  Fats.  Foods containing healthful fats, such as nuts,  avocado, olive oil, canola oil, and fish. DOES EVERYONE WITH DIABETES MELLITUS HAVE THE SAME MEAL PLAN? Because every person with diabetes mellitus is different, there is not one meal plan that works for everyone. It is very important that you meet with a dietitian who will help you create a meal plan that is just right for you.   This information is not intended to replace advice given to you by your health care provider. Make sure you discuss any questions you have with your health care provider.   Document Released: 04/22/2005 Document Revised: 08/16/2014 Document Reviewed: 06/22/2013 Elsevier Interactive Patient Education 2016 [employer]  

## 2015-06-16 NOTE — Progress Notes (Signed)
   Subjective:    Patient ID: Deanna Salinas, female    DOB: 09/29/1935, 79 y.o.   MRN: [id number]  HPIpt states she is following up on her blood test results.  Has appt this week with hematologist on Wednesday.  A1C done on [place] 4 weeks ago 7.3.  Cough for over 1 week. Pt states she is better. No fever, no sob. Taking robittusim, cough drops and vicks. No fever no wheezing  Patient had recent lab work. Relates occasional fatigue. Patient also relates compliance with medication. States her diet is not as good as it should be. She denies chest tightness pressure pain shortness of breath.  Review of Systems  Constitutional: Negative for activity change, appetite change and fatigue.  HENT: Negative for congestion.   Respiratory: Negative for cough.   Cardiovascular: Negative for chest pain.  Gastrointestinal: Negative for abdominal pain.  Endocrine: Negative for polydipsia and polyphagia.  Neurological: Negative for weakness.  Psychiatric/Behavioral: Negative for confusion.       Objective:   Physical Exam  Constitutional: She appears well-nourished. No distress.  Cardiovascular: Normal rate, regular rhythm and normal heart sounds.   No murmur heard. Pulmonary/Chest: Effort normal and breath sounds normal. No respiratory distress.  Musculoskeletal: She exhibits no edema.  Lymphadenopathy:    She has no cervical adenopathy.  Neurological: She is alert. She exhibits normal muscle tone.  Psychiatric: Her behavior is normal.  Vitals reviewed.  Past medical history borderline thyroid function prediabetes obesity hypertension       Assessment & Plan:  HTN good control continue current medications watch diet closely Insomnia continue current medications as he needs to help a fair amount Reflux good control continue PPI Arthritis uses Voltaren when necessary  leukocytosis-referral to hematology is being completed rationale explained to the patient Borderline hypothyroidism  repeat again in one years time Diabetes-patient does not want to be on medication she will watch diet closely follow-up in approximate 3 months if A1c doing better no need for medication of is not doing better she will need to be on medicine. 25 minutes was spent with the patient. Greater than half the time was spent in discussion and answering questions and counseling regarding the issues that the patient came in for today.  I do believe the patient would benefit from having knee surgery but I recommend she see hematology first plus also if she does decide to do knee surgery we will need to refer her to cardiology for cardiac clearance she will let us know.

## 2015-06-18 ENCOUNTER — Encounter (HOSPITAL_COMMUNITY): Payer: Medicare Other | Attending: Hematology & Oncology | Admitting: Hematology & Oncology

## 2015-06-18 VITALS — BP 131/88 | HR 98 | Temp 98.0°F | Resp 20 | Ht 65.0 in | Wt 237.0 lb

## 2015-06-18 DIAGNOSIS — D72829 Elevated white blood cell count, unspecified: Secondary | ICD-10-CM | POA: Insufficient documentation

## 2015-06-18 LAB — CBC WITH DIFFERENTIAL/PLATELET
Basophils Absolute: 0.1 10*3/uL (ref 0.0–0.1)
Basophils Relative: 1 %
Eosinophils Absolute: 0.3 10*3/uL (ref 0.0–0.7)
Eosinophils Relative: 3 %
HCT: 41.6 % (ref 36.0–46.0)
Hemoglobin: 13.5 g/dL (ref 12.0–15.0)
Lymphocytes Relative: 31 %
Lymphs Abs: 3.2 10*3/uL (ref 0.7–4.0)
MCH: 31.4 pg (ref 26.0–34.0)
MCHC: 32.5 g/dL (ref 30.0–36.0)
MCV: 96.7 fL (ref 78.0–100.0)
Monocytes Absolute: 0.9 10*3/uL (ref 0.1–1.0)
Monocytes Relative: 9 %
Neutro Abs: 6 10*3/uL (ref 1.7–7.7)
Neutrophils Relative %: 56 %
Platelets: 270 10*3/uL (ref 150–400)
RBC: 4.3 MIL/uL (ref 3.87–5.11)
RDW: 12.4 % (ref 11.5–15.5)
WBC: 10.5 10*3/uL (ref 4.0–10.5)

## 2015-06-18 NOTE — Patient Instructions (Signed)
[place] at [facility] Discharge Instructions  RECOMMENDATIONS MADE BY THE CONSULTANT AND ANY TEST RESULTS WILL BE SENT TO YOUR REFERRING PHYSICIAN.  Today you are having labs drawn.   Return in 2 weeks to see Dr. Whitney Muse or Gershon Mussel (Physician Assistant)   Thank you for choosing [place] at [facility] to provide your oncology and hematology care.  To afford each patient quality time with our provider, please arrive at least 15 minutes before your scheduled appointment time.    You need to re-schedule your appointment should you arrive 10 or more minutes late.  We strive to give you quality time with our providers, and arriving late affects you and other patients whose appointments are after yours.  Also, if you no show three or more times for appointments you may be dismissed from the clinic at the providers discretion.     Again, thank you for choosing [facility].  Our hope is that these requests will decrease the amount of time that you wait before being seen by our physicians.       _____________________________________________________________  Should you have questions after your visit to [facility], please contact our office at [phone] between the hours of 8:30 a.m. and 4:30 p.m.  Voicemails left after 4:30 p.m. will not be returned until the following business day.  For prescription refill requests, have your pharmacy contact our office.

## 2015-06-18 NOTE — Progress Notes (Signed)
[facility] NOTE  Patient Care Team: Kathyrn Drown, MD as PCP - General (Family Medicine) Daneil Dolin, MD as Consulting Physician (Gastroenterology)  CHIEF COMPLAINTS/PURPOSE OF CONSULTATION:  Leukocytosis  HISTORY OF PRESENTING ILLNESS:  Deanna Salinas 79 y.o. female is here for further evaluation of leukocytosis. She has had intermittent elevation in her counts with a differential showing a mild lymphocytosis.   She is here alone today and very talkative, in good spirits. She doesn't have many complaints. She states that she's mainly surprised that aging is painful. Her sciatic nerve went out and she says that was the "first thing that went off" with aging.  She gets her mammograms every year. She has had two colonoscopies so far, and got her flu shot yesterday. She eats well. Describes her energy and activity levels as good.  MEDICAL HISTORY:  Past Medical History  Diagnosis Date  . Hypertension   . Hyperlipidemia   . Hyperglycemia   . Fatty liver   . Diabetes mellitus without complication ([place])     SURGICAL HISTORY: Past Surgical History  Procedure Laterality Date  . Cesarean section      X5  . Colonoscopy    . Colonoscopy N/A 06/11/2013    colonic diverticulosis, hyperplastic polyps. no further screening colonoscopies recommended  . Appendectomy      at the time of hysterectomy  . Esophagogastroduodenoscopy N/A 02/20/2014    Procedure: ESOPHAGOGASTRODUODENOSCOPY (EGD);  Surgeon: Daneil Dolin, MD;  [facility];  Service: Endoscopy;  Laterality: N/A;  11:00    SOCIAL HISTORY: Social History   Social History  . Marital Status: Married    Spouse Name: N/A  . Number of Children: 5  . Years of Education: N/A   Occupational History  .     Social History Main Topics  . Smoking status: Never Smoker   . Smokeless tobacco: Not on file  . Alcohol Use: 1.8 oz/week    3 Glasses of wine per week     Comment:  occasional  . Drug Use: No  . Sexual Activity: Not on file   Other Topics Concern  . Not on file   Social History Narrative   Married for 82 years; has 5 children, 13 grandchildren She says her kids are great; her youngest grandchild is 55; oldest is 56 She and her husband followed her kids to [place]; they're from [place] Her extended family is still in [place]  Never smoker; a little white wine and red wine here and there She worked in [employer]; she says if you want a resume, tell me and I probably did it  Had 5 ceasarians; only time she's been in the hospital  She loves to read, played with writing, and hooked on [email or web address] She says she's slowed down; she used to garden, can, do everything She says it takes all day to get everything done now. [facility]  FAMILY HISTORY: Family History  Problem Relation Age of Onset  . Hypertension Father   . Hypertension Other   . Hypertension Other   . Colon cancer Neg Hx    has no family status information on file.   Her mother died 81, lung cancer, smoked from the time she was 79 years old Had a hard life; she was 46 when her dad died; She had 3 brothers, 2 sisters; sisters became nurses, brothers entered [employer] Her dad  died at 11; heart trouble She has brother and sister; brother is dead Her brother was killed by her sister's husband "a very traumatic, bad thing." Her brother was an Art gallery manager, kind, gentle; her sister is kind and gentle  Her daughter Altha Harm, age 45, had a double mastectomy - she's had surgery, chemo, radiation Her mother had cancer; and her mother died from uterine cancer  ALLERGIES:  is allergic to [place]; lipitor; metformin and related; pravastatin; and trazodone and nefazodone.  MEDICATIONS:  Current Outpatient Prescriptions  Medication Sig Dispense Refill  . amitriptyline (ELAVIL) 50 MG tablet Take 2 qhs (Patient taking differently:  Take 50 mg by mouth at bedtime. Take 2 qhs) 180 tablet 1  . diclofenac (VOLTAREN) 75 MG EC tablet Take 75 mg by mouth as needed.    . Eszopiclone 3 MG TABS TAKE (1) TABLET BY MOUTH AT BEDTIME. 90 tablet 1  . glucose blood test strip Use as instructed 50 each 5  . naproxen sodium (ANAPROX) 220 MG tablet Take 220 mg by mouth as needed.    . Omega-3 Fatty Acids (FISH OIL PO) Take 1 capsule by mouth daily.     Marland Kitchen OVER THE COUNTER MEDICATION Take 1 tablet by mouth daily. Vit d 3    . pantoprazole (PROTONIX) 40 MG tablet TAKE 1 TABLET BY MOUTH DAILY. 30 tablet 6  . quinapril (ACCUPRIL) 20 MG tablet Take 1 tablet (20 mg total) by mouth 2 (two) times daily. 180 tablet 1  . TURMERIC PO Take 1 capsule by mouth daily.      No current facility-administered medications for this visit.    Review of Systems  Constitutional: Negative.  Negative for fever, chills, weight loss and malaise/fatigue.  HENT: Negative.  Negative for congestion, hearing loss, nosebleeds, sore throat and tinnitus.   Eyes: Negative.  Negative for blurred vision, double vision, pain and discharge.  Respiratory: Negative.  Negative for cough, hemoptysis, sputum production, shortness of breath and wheezing.   Cardiovascular: Negative.  Negative for chest pain, palpitations, claudication, leg swelling and PND.  Gastrointestinal: Negative.  Negative for heartburn, nausea, vomiting, abdominal pain, diarrhea, constipation, blood in stool and melena.  Genitourinary: Negative.  Negative for dysuria, urgency, frequency and hematuria.  Musculoskeletal: Negative.  Negative for myalgias, joint pain and falls.  Skin: Negative.  Negative for itching and rash.  Neurological: Negative.  Negative for dizziness, tingling, tremors, sensory change, speech change, focal weakness, seizures, loss of consciousness, weakness and headaches.  Endo/Heme/Allergies: Negative.  Does not bruise/bleed easily.  Psychiatric/Behavioral: Negative.  Negative for  depression, suicidal ideas, memory loss and substance abuse. The patient is not nervous/anxious and does not have insomnia.   All other systems reviewed and are negative.  14 point ROS was done and is otherwise as detailed above or in HPI   PHYSICAL EXAMINATION: ECOG PERFORMANCE STATUS: 0 - Asymptomatic  Filed Vitals:   06/18/15 1440  BP: 131/88  Pulse: 98  Temp: 98 F (36.7 C)  Resp: 20   Filed Weights   06/18/15 1440  Weight: 237 lb (107.502 kg)     Physical Exam  Constitutional: She is oriented to person, place, and time and well-developed, well-nourished, and in no distress.  HENT:  Head: Normocephalic and atraumatic.  Nose: Nose normal.  Mouth/Throat: Oropharynx is clear and moist. No oropharyngeal exudate.  Eyes: Conjunctivae and EOM are normal. Pupils are equal, round, and reactive to light. Right eye exhibits no discharge. Left eye exhibits no discharge. No scleral icterus.  Neck: Normal range of motion. Neck supple. No tracheal deviation present. No thyromegaly present.  Cardiovascular: Normal rate, regular rhythm and normal heart sounds.  Exam reveals no gallop and no friction rub.   No murmur heard. Pulmonary/Chest: Effort normal and breath sounds normal. She has no wheezes. She has no rales.  Abdominal: Soft. Bowel sounds are normal. She exhibits no distension and no mass. There is no tenderness. There is no rebound and no guarding.  Musculoskeletal: Normal range of motion. She exhibits no edema.  Lymphadenopathy:    She has no cervical adenopathy.  Neurological: She is alert and oriented to person, place, and time. She has normal reflexes. No cranial nerve deficit. Gait normal. Coordination normal.  Skin: Skin is warm and dry. No rash noted.  Psychiatric: Mood, memory, affect and judgment normal.  Nursing note and vitals reviewed.    LABORATORY DATA:  I have reviewed the data as listed Lab Results  Component Value Date   WBC 11.3* 05/19/2015   HGB 12.8  02/13/2014   HCT 41.7 05/19/2015   MCV 91.3 02/13/2014   PLT 295 02/13/2014   CMP     Component Value Date/Time   NA 143 05/19/2015 1536   NA 139 09/02/2014 1125   K 5.4* 05/19/2015 1536   CL 99 05/19/2015 1536   CO2 26 05/19/2015 1536   GLUCOSE 137* 05/19/2015 1536   GLUCOSE 140* 09/02/2014 1125   BUN 18 05/19/2015 1536   BUN 24* 09/02/2014 1125   CREATININE 0.97 05/19/2015 1536   CREATININE 0.98 09/02/2014 1125   CALCIUM 9.8 05/19/2015 1536   PROT 7.1 05/19/2015 1536   PROT 6.5 09/02/2014 1125   ALBUMIN 4.3 05/19/2015 1536   ALBUMIN 4.0 09/02/2014 1125   AST 20 05/19/2015 1536   ALT 29 05/19/2015 1536   ALKPHOS 100 05/19/2015 1536   BILITOT 0.2 05/19/2015 1536   BILITOT 0.4 09/02/2014 1125   GFRNONAA 57* 05/19/2015 1536   GFRAA 65 05/19/2015 1536    ASSESSMENT & PLAN:  Leukocytosis  We reviewed potential causes of leukocytosis including reactive, inflammatory, infectious and primary hematologic conditions such as chronic leukemias.  I do not feel based upon her labs that she has a primary hematologic disorder.   She is agreeable to proceeding with additional bloodwork today.   It will take a week to a week and a half to get her results back, at which point we will follow up with her. If workup is benign she had questions regarding ongoing follow-up and I advised her we could turn her back to Dr. Wolfgang Phoenix with prn follow-up with [place]. We can address this at her next appointment.  Orders Placed This Encounter  Procedures  . CBC with Differential    Standing Status: Future     Number of Occurrences: 1     Standing Expiration Date: 06/17/2016  . Pathologist smear review    Dr. Whitney Muse to review the smear. Please let us know when it is available.    Standing Status: Future     Number of Occurrences: 1     Standing Expiration Date: 06/17/2016    All questions were answered. The patient knows to call the clinic with any problems, questions or concerns.  This document  serves as a record of services personally performed by Ancil Linsey, MD. It was created on her behalf by Toni Amend, a trained medical scribe. The creation of this record is based on the scribe's personal observations and the provider's statements to them.  This document has been checked and approved by the attending provider.  I have reviewed the above documentation for accuracy and completeness and I agree with the above.  This note was electronically signed.    Molli Hazard, MD  06/18/2015 3:28 PM

## 2015-06-19 DIAGNOSIS — D72829 Elevated white blood cell count, unspecified: Secondary | ICD-10-CM | POA: Diagnosis not present

## 2015-07-01 ENCOUNTER — Encounter (HOSPITAL_BASED_OUTPATIENT_CLINIC_OR_DEPARTMENT_OTHER): Payer: Medicare Other | Admitting: Oncology

## 2015-07-01 ENCOUNTER — Encounter (HOSPITAL_COMMUNITY): Payer: Self-pay | Admitting: Oncology

## 2015-07-01 VITALS — BP 162/96 | HR 95 | Temp 98.3°F | Resp 20 | Wt 234.5 lb

## 2015-07-01 DIAGNOSIS — D72829 Elevated white blood cell count, unspecified: Secondary | ICD-10-CM

## 2015-07-01 NOTE — Progress Notes (Signed)
[place], MD [place] [address]  Leukocytosis  CURRENT THERAPY: Peripheral work-up for leukocytosis  INTERVAL HISTORY: Deanna Salinas 79 y.o. female returns for followup of leukocytosis.  I personally reviewed and went over laboratory results with the patient.  The results are noted within this dictation.  No concerning findings noted on peripheral work-up for leukocytosis.  WBC ULN since at least 2007 according to [facility].  I personally reviewed and went over radiographic studies with the patient.  The results are noted within this dictation.  She is up-to-date on mammography.  She denies any B symptoms  Past Medical History  Diagnosis Date  . Hypertension   . Hyperlipidemia   . Hyperglycemia   . Fatty liver   . Diabetes mellitus without complication ([place])     has Hyperlipidemia; DEPRESSION; MIGRAINE HEADACHE; CATARACT NOS; Essential hypertension; OSTEOARTHRITIS; SCIATICA; POSTMENOPAUSAL OSTEOPOROSIS; Insomnia; Pancreatitis, acute; Abdominal pain, epigastric; Elevated lipase; Type 2 diabetes mellitus ([place]); and Leukocytosis on her problem list.     is allergic to ambien; lipitor; metformin and related; pravastatin; and trazodone and nefazodone.  Current Outpatient Prescriptions on File Prior to Visit  Medication Sig Dispense Refill  . amitriptyline (ELAVIL) 50 MG tablet Take 2 qhs (Patient taking differently: Take 50 mg by mouth at bedtime. Take 2 qhs) 180 tablet 1  . diclofenac (VOLTAREN) 75 MG EC tablet Take 75 mg by mouth as needed.    . Eszopiclone 3 MG TABS TAKE (1) TABLET BY MOUTH AT BEDTIME. 90 tablet 1  . glucose blood test strip Use as instructed 50 each 5  . naproxen sodium (ANAPROX) 220 MG tablet Take 220 mg by mouth as needed.    . Omega-3 Fatty Acids (FISH OIL PO) Take 1 capsule by mouth daily.     Marland Kitchen OVER THE COUNTER MEDICATION Take 1 tablet by mouth daily. Vit d 3    . pantoprazole (PROTONIX) 40 MG tablet TAKE 1 TABLET BY  MOUTH DAILY. 30 tablet 6  . TURMERIC PO Take 1 capsule by mouth daily.     . quinapril (ACCUPRIL) 20 MG tablet Take 1 tablet (20 mg total) by mouth 2 (two) times daily. 180 tablet 1   No current facility-administered medications on file prior to visit.    Past Surgical History  Procedure Laterality Date  . Cesarean section      X5  . Colonoscopy    . Colonoscopy N/A 06/11/2013    colonic diverticulosis, hyperplastic polyps. no further screening colonoscopies recommended  . Appendectomy      at the time of hysterectomy  . Esophagogastroduodenoscopy N/A 02/20/2014    Procedure: ESOPHAGOGASTRODUODENOSCOPY (EGD);  Surgeon: Daneil Dolin, MD;  [facility];  Service: Endoscopy;  Laterality: N/A;  11:00    Denies any headaches, dizziness, double vision, fevers, chills, night sweats, nausea, vomiting, diarrhea, constipation, chest pain, heart palpitations, shortness of breath, blood in stool, black tarry stool, urinary pain, urinary burning, urinary frequency, hematuria.   PHYSICAL EXAMINATION  ECOG PERFORMANCE STATUS: 0 - Asymptomatic  Filed Vitals:   07/01/15 1043  BP: 162/96  Pulse: 95  Temp: 98.3 F (36.8 C)  Resp: 20    GENERAL:alert, no distress, well nourished, well developed, comfortable, cooperative, obese, smiling and unaccompanied today. SKIN: skin color, texture, turgor are normal, no rashes or significant lesions HEAD: Normocephalic, No masses, lesions, tenderness or abnormalities EYES: normal, EOMI, Conjunctiva are pink and non-injected EARS: External ears normal OROPHARYNX:lips,  buccal mucosa, and tongue normal and mucous membranes are moist  NECK: supple, no adenopathy, trachea midline LYMPH:  no palpable lymphadenopathy BREAST:not examined LUNGS: clear to auscultation and percussion HEART: regular rate & rhythm, no murmurs and no gallops ABDOMEN:abdomen soft, obese and normal bowel sounds BACK: Back symmetric, no curvature. EXTREMITIES:less then 2  second capillary refill, no joint deformities, effusion, or inflammation, no skin discoloration, no cyanosis  NEURO: alert & oriented x 3 with fluent speech, no focal motor/sensory deficits, gait normal   LABORATORY DATA: CBC    Component Value Date/Time   WBC 10.5 06/18/2015 1536   WBC 11.3* 05/19/2015 1536   RBC 4.30 06/18/2015 1536   RBC 4.48 05/19/2015 1536   HGB 13.5 06/18/2015 1536   HCT 41.6 06/18/2015 1536   HCT 41.7 05/19/2015 1536   PLT 270 06/18/2015 1536   MCV 96.7 06/18/2015 1536   MCH 31.4 06/18/2015 1536   MCH 30.8 05/19/2015 1536   MCHC 32.5 06/18/2015 1536   MCHC 33.1 05/19/2015 1536   RDW 12.4 06/18/2015 1536   RDW 13.2 05/19/2015 1536   LYMPHSABS 3.2 06/18/2015 1536   LYMPHSABS 3.8* 05/19/2015 1536   MONOABS 0.9 06/18/2015 1536   EOSABS 0.3 06/18/2015 1536   BASOSABS 0.1 06/18/2015 1536   BASOSABS 0.1 05/19/2015 1536      Chemistry      Component Value Date/Time   NA 143 05/19/2015 1536   NA 139 09/02/2014 1125   K 5.4* 05/19/2015 1536   CL 99 05/19/2015 1536   CO2 26 05/19/2015 1536   BUN 18 05/19/2015 1536   BUN 24* 09/02/2014 1125   CREATININE 0.97 05/19/2015 1536   CREATININE 0.98 09/02/2014 1125      Component Value Date/Time   CALCIUM 9.8 05/19/2015 1536   ALKPHOS 100 05/19/2015 1536   AST 20 05/19/2015 1536   ALT 29 05/19/2015 1536   BILITOT 0.2 05/19/2015 1536   BILITOT 0.4 09/02/2014 1125        PENDING LABS:   RADIOGRAPHIC STUDIES:  No results found.   PATHOLOGY:  06/19/2015  Interpretation Peripheral Blood Flow Cytometry - NO MONOCLONAL B-CELL POPULATION OR ABNORMAL T-CELL PHENOTYPE IDENTIFIED. Susanne Greenhouse MD Pathologist, Electronic Signature (Case signed 06/19/2015)    ASSESSMENT AND PLAN:  Leukocytosis Leukocytosis with ULN values of WBC dating back to at least 2007 without persistent elevation in a particular WBC line.  Peripheral work-up on 06/18/2015 demonstrated a ULN WBC with a normal differential.   Peripheral flow cytometry was negative for any monoclonal B-cell population or abnormal T-cell phenotype.    Will release the patient from the [facility].  Would be glad to see her back at anytime for any hem/onc issue at the discretion of her primary care provider, Dr. Wolfgang Phoenix.  THERAPY PLAN:  Release from [facility].  All questions were answered. The patient knows to call the clinic with any problems, questions or concerns. We can certainly see the patient much sooner if necessary.  Patient and plan discussed with Dr. Ancil Linsey and she is in agreement with the aforementioned.   This note is electronically signed by: Doy Mince 07/01/2015 11:16 AM

## 2015-07-01 NOTE — Patient Instructions (Signed)
[place] at [facility] Discharge Instructions  RECOMMENDATIONS MADE BY THE CONSULTANT AND ANY TEST RESULTS WILL BE SENT TO YOUR REFERRING PHYSICIAN.  Discussion by Robynn Pane, PA-C Labs stable  Will release you back to Dr. Wolfgang Phoenix  Thank you for choosing [place] at [facility] to provide your oncology and hematology care.  To afford each patient quality time with our provider, please arrive at least 15 minutes before your scheduled appointment time.    You need to re-schedule your appointment should you arrive 10 or more minutes late.  We strive to give you quality time with our providers, and arriving late affects you and other patients whose appointments are after yours.  Also, if you no show three or more times for appointments you may be dismissed from the clinic at the providers discretion.     Again, thank you for choosing [facility].  Our hope is that these requests will decrease the amount of time that you wait before being seen by our physicians.       _____________________________________________________________  Should you have questions after your visit to [facility], please contact our office at [phone] between the hours of 8:30 a.m. and 4:30 p.m.  Voicemails left after 4:30 p.m. will not be returned until the following business day.  For prescription refill requests, have your pharmacy contact our office.

## 2015-07-01 NOTE — Assessment & Plan Note (Signed)
Leukocytosis with ULN values of WBC dating back to at least 2007 without persistent elevation in a particular WBC line.  Peripheral work-up on 06/18/2015 demonstrated a ULN WBC with a normal differential.  Peripheral flow cytometry was negative for any monoclonal B-cell population or abnormal T-cell phenotype.    Will release the patient from the [facility]  Would be glad to see her back at anytime for any hem/onc issue at the discretion of her primary care provider, Dr. Wolfgang Phoenix.

## 2015-07-19 ENCOUNTER — Other Ambulatory Visit: Payer: Self-pay | Admitting: Family Medicine

## 2015-07-31 ENCOUNTER — Encounter (HOSPITAL_COMMUNITY): Payer: Self-pay | Admitting: Hematology & Oncology

## 2015-09-08 ENCOUNTER — Other Ambulatory Visit: Payer: Self-pay | Admitting: Family Medicine

## 2015-09-08 NOTE — Telephone Encounter (Signed)
This and 3 refills 

## 2015-09-22 ENCOUNTER — Other Ambulatory Visit: Payer: Self-pay | Admitting: Family Medicine

## 2015-09-23 ENCOUNTER — Ambulatory Visit: Payer: Medicare Other | Admitting: Family Medicine

## 2015-09-24 ENCOUNTER — Ambulatory Visit: Payer: Medicare Other | Admitting: Family Medicine

## 2015-10-08 ENCOUNTER — Other Ambulatory Visit: Payer: Self-pay | Admitting: Family Medicine

## 2015-10-08 MED ORDER — QUINAPRIL HCL 20 MG PO TABS: 20.0000 mg | ORAL_TABLET | Freq: Two times a day (BID) | ORAL | Status: DC

## 2015-10-08 NOTE — Telephone Encounter (Signed)
Patient is using new [facility] and needing  90 day supply of quinapril 20 mg faxed to [phone].

## 2015-10-08 NOTE — Telephone Encounter (Signed)
Patient notified that med was sent to pharmacy.

## 2015-10-22 ENCOUNTER — Telehealth: Payer: Self-pay | Admitting: Family Medicine

## 2015-10-22 NOTE — Telephone Encounter (Signed)
Please see letter from patients spouse Adah Salvage) regarding pain that Tylah is experiencing.  This is located in red folder at the nurse station.

## 2015-10-22 NOTE — Telephone Encounter (Signed)
I received a letter from the husband regarding his wife apparently she is been having significant pain and discomfort which makes it difficult for her to rest. Please let the family know I am very sympathetic to what is going on with Deanna Salinas. I will be more than happy to see her this coming week for further evaluation of her discomfort and to discuss options that include possibility of physical therapy and possibility of medication changes. I believe it would be and her best interest for me to see the patient so that a proper evaluation and discussion face-to-face may occur. If the wife would like to have her husband come with her that would be perfectly fine as well thank you

## 2015-10-23 NOTE — Telephone Encounter (Signed)
Transferred husband to front desk to schedule appointment.

## 2015-10-23 NOTE — Telephone Encounter (Signed)
[facility] (vm box is full)

## 2015-10-29 ENCOUNTER — Encounter: Payer: Self-pay | Admitting: Family Medicine

## 2015-10-29 ENCOUNTER — Ambulatory Visit (INDEPENDENT_AMBULATORY_CARE_PROVIDER_SITE_OTHER): Payer: Medicare Other | Admitting: Family Medicine

## 2015-10-29 VITALS — BP 122/82 | Ht 68.0 in | Wt 237.4 lb

## 2015-10-29 DIAGNOSIS — E119 Type 2 diabetes mellitus without complications: Secondary | ICD-10-CM | POA: Diagnosis not present

## 2015-10-29 DIAGNOSIS — M2550 Pain in unspecified joint: Secondary | ICD-10-CM

## 2015-10-29 MED ORDER — HYDROCODONE-ACETAMINOPHEN 5-325 MG PO TABS: 1.0000 | ORAL_TABLET | Freq: Three times a day (TID) | ORAL | Status: DC | PRN

## 2015-10-29 MED ORDER — MELOXICAM 15 MG PO TABS: 15.0000 mg | ORAL_TABLET | Freq: Every day | ORAL | Status: DC

## 2015-10-29 NOTE — Progress Notes (Signed)
   Subjective:    Patient ID: Deanna Salinas, female    DOB: 1935-10-13, 80 y.o.   MRN: [id number]  HPI  Patient arrives to discuss pain and aches that are all over.She denies true muscle pain she just relates a lot of stiffness in her joints pain and discomfort in the knees and lower legs wakes her up at night makes it difficult for her to go to sleep and has difficult time with pain during the day she denies sweats chills or vomiting. She does take amitriptyline at nighttime. She does try to stay physically active. She finds it very hard to do any physical activity though because of the joint pains in the pain and discomfort  Review of Systems She described joint pains to some degree global but worse in her knees to some degrees worse in her hips and lower back and sometimes elbows and hands she denies chest pressure tightness shortness of breath. She relates pain and discomfort present during the day as well as at night    Objective:   Physical Exam  Her lungs are clear hearts regular she has severe osteoarthritis of both knees subjective lower back discomfort subjective shoulder and elbow discomfort she also has some arthritic changes of the hand but not severe      Assessment & Plan:  The patient is to be very careful with pain medication half tablet to a whole tablet as necessary cautioned drowsiness if it causes ataxia or drowsiness she needs to stop this otherwise she is to continue her standard medicines and use of pain medicines when necessary but no more than 2 in a. I would recommend that the patient follow-up with [place] within a few weeks time to recheck  Consider the possibility of Cymbalta if ongoing troubles or does not tolerate Vicodin.  Extensive lab work recommended to look for autoimmune or inflammatory causes of her joint pains and discomfort. Patient also with diabetes we will check A1c  We will switch from diclofenac. Now use mobic 1 daily if this irritates her stomach she  is to stop it no over-the-counter anti-inflammatories with this.

## 2015-10-29 NOTE — Patient Instructions (Signed)
Use meloxicam one a day for arthritis  Hydrocodone 1/2 to 1 pill no greater than 3 times a day   recheck in 3 weels

## 2015-10-30 LAB — SEDIMENTATION RATE: Sed Rate: 10 mm/hr (ref 0–40)

## 2015-10-30 LAB — HEMOGLOBIN A1C
Est. average glucose Bld gHb Est-mCnc: 166 mg/dL
Hgb A1c MFr Bld: 7.4 % — ABNORMAL HIGH (ref 4.8–5.6)

## 2015-10-30 LAB — C-REACTIVE PROTEIN: CRP: 4.1 mg/L (ref 0.0–4.9)

## 2015-10-30 LAB — ANA: Anti Nuclear Antibody(ANA): NEGATIVE

## 2015-10-30 LAB — RHEUMATOID FACTOR: Rhuematoid fact SerPl-aCnc: <10 IU/mL (ref 0.0–13.9)

## 2015-11-19 ENCOUNTER — Ambulatory Visit (INDEPENDENT_AMBULATORY_CARE_PROVIDER_SITE_OTHER): Payer: Medicare Other | Admitting: Family Medicine

## 2015-11-19 ENCOUNTER — Encounter: Payer: Self-pay | Admitting: Family Medicine

## 2015-11-19 VITALS — BP 134/84 | Ht 68.0 in | Wt 241.2 lb

## 2015-11-19 DIAGNOSIS — E119 Type 2 diabetes mellitus without complications: Secondary | ICD-10-CM | POA: Diagnosis not present

## 2015-11-19 DIAGNOSIS — M1712 Unilateral primary osteoarthritis, left knee: Secondary | ICD-10-CM | POA: Diagnosis not present

## 2015-11-19 MED ORDER — DICLOFENAC SODIUM 75 MG PO TBEC: 75.0000 mg | DELAYED_RELEASE_TABLET | Freq: Two times a day (BID) | ORAL | Status: DC

## 2015-11-19 MED ORDER — SITAGLIPTIN PHOSPHATE 25 MG PO TABS: 25.0000 mg | ORAL_TABLET | Freq: Every day | ORAL | Status: DC

## 2015-11-19 NOTE — Progress Notes (Signed)
   Subjective:    Patient ID: Deanna Salinas, female    DOB: 07/12/1936, 80 y.o.   MRN: [id number]  HPI Patient in today for a 3 week recheck on Arthralgia. Patient started on  Mobic and hydrocodone at last visit. States no improvement with pain.  States no other concerns this visit. She relates of the anti-inflammatory didn't do is get his previous one she would like to go back she denies any chest tightness pressure pain shortness breath.  Review of Systems See above.    Objective:   Physical Exam  Subjective discomfort in hips and the knees lungs clear heart regular diabetic foot exam normal      Assessment & Plan:  Joint pains in hips and in the knees patient would benefit from watching diet try to excise try to bring weight down We will switch her back to Voltaren stop meloxicam Diabetes subpar control start Januvia 25 mg daily. Recheck A1c in 3-4 months  Hydrocodone when necessary cautioned drowsiness

## 2016-01-09 ENCOUNTER — Other Ambulatory Visit: Payer: Self-pay | Admitting: Family Medicine

## 2016-01-29 ENCOUNTER — Encounter: Payer: Self-pay | Admitting: Family Medicine

## 2016-01-29 ENCOUNTER — Ambulatory Visit (INDEPENDENT_AMBULATORY_CARE_PROVIDER_SITE_OTHER): Payer: Medicare Other | Admitting: Family Medicine

## 2016-01-29 VITALS — BP 138/88 | Temp 98.3°F | Ht 68.0 in | Wt 234.0 lb

## 2016-01-29 DIAGNOSIS — M549 Dorsalgia, unspecified: Secondary | ICD-10-CM

## 2016-01-29 DIAGNOSIS — B9689 Other specified bacterial agents as the cause of diseases classified elsewhere: Secondary | ICD-10-CM

## 2016-01-29 DIAGNOSIS — G8929 Other chronic pain: Secondary | ICD-10-CM

## 2016-01-29 DIAGNOSIS — J019 Acute sinusitis, unspecified: Secondary | ICD-10-CM | POA: Diagnosis not present

## 2016-01-29 MED ORDER — AMOXICILLIN-POT CLAVULANATE 875-125 MG PO TABS: 1.0000 | ORAL_TABLET | Freq: Two times a day (BID) | ORAL | Status: DC

## 2016-01-29 MED ORDER — ALBUTEROL SULFATE HFA 108 (90 BASE) MCG/ACT IN AERS: 2.0000 | INHALATION_SPRAY | Freq: Four times a day (QID) | RESPIRATORY_TRACT | Status: DC | PRN

## 2016-01-29 MED ORDER — HYDROCODONE-ACETAMINOPHEN 5-325 MG PO TABS: 1.0000 | ORAL_TABLET | Freq: Three times a day (TID) | ORAL | Status: DC | PRN

## 2016-01-29 NOTE — Progress Notes (Signed)
   Subjective:    Patient ID: Deanna Salinas, female    DOB: 1935-08-20, 80 y.o.   MRN: [id number]  Sinusitis This is a new problem. Episode onset: 2 weeks. Associated symptoms include congestion, coughing, ear pain, headaches and a sore throat. Pertinent negatives include no shortness of breath. (Wheezing, fever)   Requesting refill on hydrocodone. Pt states she has pain all over.  Patient with head congestion drainage sinus congestion occasional wheeze denies high fever chills sweats  Review of Systems  Constitutional: Negative for fever and activity change.  HENT: Positive for congestion, ear pain, rhinorrhea and sore throat.   Eyes: Negative for discharge.  Respiratory: Positive for cough. Negative for shortness of breath and wheezing.   Cardiovascular: Negative for chest pain.  Neurological: Positive for headaches.       Objective:   Physical Exam  Constitutional: She appears well-developed.  HENT:  Head: Normocephalic.  Nose: Nose normal.  Mouth/Throat: Oropharynx is clear and moist. No oropharyngeal exudate.  Neck: Neck supple.  Cardiovascular: Normal rate and normal heart sounds.   No murmur heard. Pulmonary/Chest: Effort normal and breath sounds normal. She has no wheezes.  Lymphadenopathy:    She has no cervical adenopathy.  Skin: Skin is warm and dry.  Nursing note and vitals reviewed.         Assessment & Plan:  Patient was seen today for upper respiratory illness. It is felt that the patient is dealing with sinusitis. Antibiotics were prescribed today. Importance of compliance with medication was discussed. Symptoms should gradually resolve over the course of the next several days. If high fevers, progressive illness, difficulty breathing, worsening condition or failure for symptoms to improve over the next several days then the patient is to follow-up. If any emergent conditions the patient is to follow-up in the emergency department otherwise to follow-up in  the office.  The patient was seen today as part of a comprehensive visit regarding pain control. Patient's compliance with the medication as well as discussion regarding effectiveness was completed. Prescription was written. The patient was assessed for any signs of severe side effects. The patient was advised to take the medicine as directed and to report to [place] if any side effect issues.

## 2016-02-06 ENCOUNTER — Ambulatory Visit: Payer: Medicare Other | Admitting: Family Medicine

## 2016-03-02 ENCOUNTER — Ambulatory Visit (INDEPENDENT_AMBULATORY_CARE_PROVIDER_SITE_OTHER): Payer: Medicare Other | Admitting: Family Medicine

## 2016-03-02 ENCOUNTER — Encounter: Payer: Self-pay | Admitting: Family Medicine

## 2016-03-02 VITALS — BP 128/86 | Ht 68.0 in | Wt 232.0 lb

## 2016-03-02 DIAGNOSIS — E119 Type 2 diabetes mellitus without complications: Secondary | ICD-10-CM | POA: Diagnosis not present

## 2016-03-02 DIAGNOSIS — M150 Primary generalized (osteo)arthritis: Secondary | ICD-10-CM | POA: Diagnosis not present

## 2016-03-02 DIAGNOSIS — I10 Essential (primary) hypertension: Secondary | ICD-10-CM

## 2016-03-02 DIAGNOSIS — E785 Hyperlipidemia, unspecified: Secondary | ICD-10-CM

## 2016-03-02 DIAGNOSIS — Z79899 Other long term (current) drug therapy: Secondary | ICD-10-CM | POA: Diagnosis not present

## 2016-03-02 DIAGNOSIS — M159 Polyosteoarthritis, unspecified: Secondary | ICD-10-CM

## 2016-03-02 DIAGNOSIS — R600 Localized edema: Secondary | ICD-10-CM

## 2016-03-02 DIAGNOSIS — G4700 Insomnia, unspecified: Secondary | ICD-10-CM | POA: Diagnosis not present

## 2016-03-02 LAB — POCT GLYCOSYLATED HEMOGLOBIN (HGB A1C): Hemoglobin A1C: 6.4

## 2016-03-02 MED ORDER — HYDROCODONE-ACETAMINOPHEN 5-325 MG PO TABS: 1.0000 | ORAL_TABLET | Freq: Two times a day (BID) | ORAL | 0 refills | Status: DC | PRN

## 2016-03-02 MED ORDER — ESZOPICLONE 3 MG PO TABS: ORAL_TABLET | ORAL | 1 refills | Status: DC

## 2016-03-02 NOTE — Progress Notes (Signed)
   Subjective:    Patient ID: Deanna Salinas, female    DOB: 08-Jan-1936, 80 y.o.   MRN: [id number]  Diabetes  She presents for her follow-up diabetic visit. She has type 2 diabetes mellitus. Pertinent negatives for hypoglycemia include no confusion. Pertinent negatives for diabetes include no chest pain, no fatigue, no polydipsia, no polyphagia and no weakness. Current diabetic treatment includes diet. Compliance with diabetes treatment: not taking any diabetic meds.  A1C today 6.4.  Concerns about left leg. Having swelling.   This patient was seen today for chronic pain. Has pain all over.  The medication list was reviewed and updated.   -Compliance with medication: yes  - Number patient states they take daily: takes one half tablet at night  -when was the last dose patient took? Last night  The patient was advised the importance of maintaining medication and not using illegal substances with these.  Refills needed:  yes  The patient was educated that we can provide 3 monthly scripts for their medication, it is their responsibility to follow the instructions.  Side effects or complications from medications: none  Patient is aware that pain medications are meant to minimize the severity of the pain to allow their pain levels to improve to allow for better function. They are aware of that pain medications cannot totally remove their pain.  Due for UDT ( at least once per year) : pain contract given. No UDT per dr Nicki Reaper  Wants tdap vaccines. Has a new grandchild.      Review of Systems  Constitutional: Negative for activity change, appetite change and fatigue.  HENT: Negative for congestion.   Respiratory: Negative for cough.   Cardiovascular: Negative for chest pain.  Gastrointestinal: Negative for abdominal pain.  Endocrine: Negative for polydipsia and polyphagia.  Neurological: Negative for weakness.  Psychiatric/Behavioral: Negative for confusion.       Objective:   Physical Exam  Constitutional: She appears well-nourished. No distress.  Cardiovascular: Normal rate, regular rhythm and normal heart sounds.   No murmur heard. Pulmonary/Chest: Effort normal and breath sounds normal. No respiratory distress.  Musculoskeletal: She exhibits no edema.  Lymphadenopathy:    She has no cervical adenopathy.  Neurological: She is alert. She exhibits normal muscle tone.  Psychiatric: Her behavior is normal.  Vitals reviewed.   25 minutes was spent with the patient. Greater than half the time was spent in discussion and answering questions and counseling regarding the issues that the patient came in for today.       Assessment & Plan:  Diabetes decent control watch diet stay on medication Hyperlipidemia recent lab work reviewed with the patient. Work indicated Reflux good control with current medication Blood pressure good control continue current medication Insomnia continue current medication Arthritic pain control hydrocodone when necessary one half tablet every 6 hours as needed caution drowsiness Pedal edema minimal. Check BNP.

## 2016-03-04 ENCOUNTER — Ambulatory Visit: Payer: Medicare Other | Admitting: Family Medicine

## 2016-03-12 ENCOUNTER — Other Ambulatory Visit: Payer: Self-pay

## 2016-03-12 MED ORDER — QUINAPRIL HCL 20 MG PO TABS: 20.0000 mg | ORAL_TABLET | Freq: Two times a day (BID) | ORAL | 1 refills | Status: DC

## 2016-03-12 NOTE — Telephone Encounter (Signed)
Six mo ok just seen

## 2016-04-23 DIAGNOSIS — Z79899 Other long term (current) drug therapy: Secondary | ICD-10-CM | POA: Diagnosis not present

## 2016-04-23 DIAGNOSIS — R600 Localized edema: Secondary | ICD-10-CM | POA: Diagnosis not present

## 2016-04-23 DIAGNOSIS — E785 Hyperlipidemia, unspecified: Secondary | ICD-10-CM | POA: Diagnosis not present

## 2016-04-23 DIAGNOSIS — E119 Type 2 diabetes mellitus without complications: Secondary | ICD-10-CM | POA: Diagnosis not present

## 2016-04-23 DIAGNOSIS — I10 Essential (primary) hypertension: Secondary | ICD-10-CM | POA: Diagnosis not present

## 2016-04-24 LAB — BASIC METABOLIC PANEL
BUN/Creatinine Ratio: 20 (ref 12–28)
BUN: 22 mg/dL (ref 8–27)
CO2: 30 mmol/L — ABNORMAL HIGH (ref 18–29)
Calcium: 9.6 mg/dL (ref 8.7–10.3)
Chloride: 98 mmol/L (ref 96–106)
Creatinine, Ser: 1.11 mg/dL — ABNORMAL HIGH (ref 0.57–1.00)
GFR calc Af Amer: 55 mL/min/{1.73_m2} — ABNORMAL LOW (ref >59)
GFR calc non Af Amer: 48 mL/min/{1.73_m2} — ABNORMAL LOW (ref >59)
Glucose: 139 mg/dL — ABNORMAL HIGH (ref 65–99)
Potassium: 5.4 mmol/L — ABNORMAL HIGH (ref 3.5–5.2)
Sodium: 140 mmol/L (ref 134–144)

## 2016-04-24 LAB — BRAIN NATRIURETIC PEPTIDE: BNP: 21.1 pg/mL (ref 0.0–100.0)

## 2016-04-24 LAB — HEPATIC FUNCTION PANEL
ALT: 25 IU/L (ref 0–32)
AST: 21 IU/L (ref 0–40)
Albumin: 4 g/dL (ref 3.5–4.8)
Alkaline Phosphatase: 93 IU/L (ref 39–117)
Bilirubin Total: 0.4 mg/dL (ref 0.0–1.2)
Bilirubin, Direct: 0.12 mg/dL (ref 0.00–0.40)
Total Protein: 6.7 g/dL (ref 6.0–8.5)

## 2016-04-24 LAB — LIPID PANEL
Chol/HDL Ratio: 4.2 ratio units (ref 0.0–4.4)
Cholesterol, Total: 208 mg/dL — ABNORMAL HIGH (ref 100–199)
HDL: 49 mg/dL (ref >39)
LDL Calculated: 133 mg/dL — ABNORMAL HIGH (ref 0–99)
Triglycerides: 132 mg/dL (ref 0–149)
VLDL Cholesterol Cal: 26 mg/dL (ref 5–40)

## 2016-04-24 LAB — MICROALBUMIN / CREATININE URINE RATIO
Creatinine, Urine: 48.5 mg/dL
MICROALB/CREAT RATIO: <6.2 mg/g creat (ref 0.0–30.0)
Microalbumin, Urine: <3 ug/mL

## 2016-04-25 ENCOUNTER — Encounter: Payer: Self-pay | Admitting: Family Medicine

## 2016-04-29 DIAGNOSIS — M1712 Unilateral primary osteoarthritis, left knee: Secondary | ICD-10-CM | POA: Diagnosis not present

## 2016-04-30 ENCOUNTER — Telehealth: Payer: Self-pay | Admitting: Family Medicine

## 2016-04-30 DIAGNOSIS — Z01818 Encounter for other preprocedural examination: Secondary | ICD-10-CM

## 2016-04-30 NOTE — Telephone Encounter (Signed)
Ortho, Dr. Lorin Mercy, recommending pt have a knee replacement  Pt needs to have cardiac clearance with a stress test and a sleep study  Does she NTBS or just refer   Please advise

## 2016-05-03 NOTE — Telephone Encounter (Signed)
#  1 refer patient to cardiology for surgical clearance #2 patient needs to fill out a sleep study questionnaire to help [place] get her sleep study approved

## 2016-05-04 NOTE — Telephone Encounter (Signed)
Left message to return call 

## 2016-05-05 NOTE — Telephone Encounter (Signed)
Referral for surgical clearance ordered in EPIC. Sleep survey left up front for patient pick up. Patient notified and verbalized understanding.

## 2016-05-07 ENCOUNTER — Encounter: Payer: Self-pay | Admitting: Family Medicine

## 2016-05-09 ENCOUNTER — Telehealth: Payer: Self-pay | Admitting: Family Medicine

## 2016-05-09 NOTE — Telephone Encounter (Signed)
Patient has abnormal sleep questionnaire patient also has symptoms of sleep apnea. Please set up for sleep study-please have nurses or [place] flag this to make sure that we check for results 3 weeks after the sleep study-patient has upcoming surgery therefore it is important to get this done relatively quickly thank you

## 2016-05-10 ENCOUNTER — Other Ambulatory Visit: Payer: Self-pay | Admitting: *Deleted

## 2016-05-10 DIAGNOSIS — R0683 Snoring: Secondary | ICD-10-CM

## 2016-05-10 NOTE — Telephone Encounter (Signed)
Pt.notified

## 2016-05-10 NOTE — Telephone Encounter (Signed)
Left message to return call with pt

## 2016-05-10 NOTE — Telephone Encounter (Signed)
Copy of message sent to [facility]. Order for sleep study put in. Left message to return call to notify patient.

## 2016-05-11 ENCOUNTER — Telehealth: Payer: Self-pay | Admitting: Family Medicine

## 2016-05-11 NOTE — Telephone Encounter (Signed)
Through her husband she sent communication then she was uneasy about Dr. Lorin Mercy doing her knee replacement. She was interested in the possibility of getting an additional opinion. Please asked the patient what was the other orthopedists that she sought regarding her knee. Based upon this information I can give response to her regarding who would be a good option to get a third opinion thank you  (please note the following which does not need to be stated to the patient-according to the letter that was sent to [place] from her husband-patient stated how many people she neither were doing presurgical physical therapy for leg strengthening and stretching. Her nephew is a orthopedic surgeon Dr. Rexene Edison in [facility] he recommended physical therapy before the operation apparently several other family members had similar experiences with their orthopedist in other parts of the country it bothered her that Dr. Lorin Mercy was not doing this type of pre-surgery physical therapy)

## 2016-05-12 NOTE — Telephone Encounter (Signed)
Discussed with pt. Pt states she has only seen Dr. Lorin Mercy. Also wanted to let you know that she has appt with cardiologist on oct 30th.

## 2016-05-13 NOTE — Telephone Encounter (Signed)
Either Dr Mardelle Matte with [facility] or Dr Maureen Ralphs with [facility] orthopedics. Both are experienced what they do an well-trained. Dr Maureen Ralphs has been in [facility] orthopedics since approximately the year 2000 and does primarily hip and knee replacements only. He is  highly regarded I would recommend either one.

## 2016-05-14 ENCOUNTER — Other Ambulatory Visit (HOSPITAL_BASED_OUTPATIENT_CLINIC_OR_DEPARTMENT_OTHER): Payer: Self-pay

## 2016-05-14 DIAGNOSIS — R5383 Other fatigue: Secondary | ICD-10-CM

## 2016-05-14 DIAGNOSIS — I10 Essential (primary) hypertension: Secondary | ICD-10-CM

## 2016-05-14 DIAGNOSIS — R400 Somnolence: Secondary | ICD-10-CM

## 2016-05-14 DIAGNOSIS — R0683 Snoring: Secondary | ICD-10-CM

## 2016-05-14 NOTE — Telephone Encounter (Signed)
Left message to return call 

## 2016-05-17 NOTE — Telephone Encounter (Signed)
Notified patient Dr. Nicki Reaper states either Dr Mardelle Matte with [facility] or Dr Maureen Ralphs with [facility] orthopedics. Both are experienced what they do an well-trained. Dr Maureen Ralphs has been in [facility] orthopedics since approximately the year 2000 and does primarily hip and knee replacements only. He is highly regarded  would recommend either one. Patient verbalized understanding and will research both options.

## 2016-05-31 DIAGNOSIS — M170 Bilateral primary osteoarthritis of knee: Secondary | ICD-10-CM | POA: Diagnosis not present

## 2016-06-01 ENCOUNTER — Ambulatory Visit: Payer: Medicare Other | Attending: Family Medicine | Admitting: Neurology

## 2016-06-01 DIAGNOSIS — R400 Somnolence: Secondary | ICD-10-CM | POA: Insufficient documentation

## 2016-06-01 DIAGNOSIS — R0683 Snoring: Secondary | ICD-10-CM | POA: Diagnosis not present

## 2016-06-01 DIAGNOSIS — R5383 Other fatigue: Secondary | ICD-10-CM | POA: Diagnosis not present

## 2016-06-01 DIAGNOSIS — I10 Essential (primary) hypertension: Secondary | ICD-10-CM | POA: Diagnosis not present

## 2016-06-01 DIAGNOSIS — G4733 Obstructive sleep apnea (adult) (pediatric): Secondary | ICD-10-CM | POA: Diagnosis not present

## 2016-06-03 ENCOUNTER — Other Ambulatory Visit: Payer: Self-pay | Admitting: Family Medicine

## 2016-06-03 NOTE — Telephone Encounter (Signed)
May have one refill of medication.

## 2016-06-03 NOTE — Telephone Encounter (Signed)
1 refill 

## 2016-06-03 NOTE — Telephone Encounter (Signed)
Patient requesting refill on hydrocodone 5/325 °

## 2016-06-04 MED ORDER — HYDROCODONE-ACETAMINOPHEN 5-325 MG PO TABS: 1.0000 | ORAL_TABLET | Freq: Two times a day (BID) | ORAL | 0 refills | Status: DC | PRN

## 2016-06-04 NOTE — Telephone Encounter (Signed)
Prescription upfront for pick up. Patient notified. 

## 2016-06-04 NOTE — Procedures (Signed)
[place] A. Merlene Laughter, MD     [email or web address]             NOCTURNAL POLYSOMNOGRAPHY   LOCATION: [facility]  Patient Name: Deanna Salinas, Deanna Salinas Date: 06/01/2016 Gender: Female D.O.B: Aug 03, 1936 Age (years): 80 Referring Provider: Sallee Lange Height (inches): 68 Interpreting Physician: Phillips Odor MD, ABSM Weight (lbs): 234 RPSGT: Rosebud Poles BMI: 36 MRN: [id number] Neck Size: 16.50 CLINICAL INFORMATION Sleep Study Type: NPSG Indication for sleep study: Excessive Daytime Sleepiness, Fatigue, Hypertension, Snoring Epworth Sleepiness Score: 6 SLEEP STUDY TECHNIQUE As per the AASM Manual for the Scoring of Sleep and Associated Events v2.3 (April 2016) with a hypopnea requiring 4% desaturations. The channels recorded and monitored were frontal, central and occipital EEG, electrooculogram (EOG), submentalis EMG (chin), nasal and oral airflow, thoracic and abdominal wall motion, anterior tibialis EMG, snore microphone, electrocardiogram, and pulse oximetry. MEDICATIONS Medications self-administered by patient taken the night of the study : N/A  Current Outpatient Prescriptions:  .  albuterol (PROVENTIL HFA;VENTOLIN HFA) 108 (90 Base) MCG/ACT inhaler, Inhale 2 puffs into the lungs every 6 (six) hours as needed for wheezing., Disp: 1 Inhaler, Rfl: 2 .  amitriptyline (ELAVIL) 50 MG tablet, TAKE (2) TABLETS BY MOUTH AT BEDTIME., Disp: 180 tablet, Rfl: 0 .  diclofenac (VOLTAREN) 75 MG EC tablet, Take 1 tablet (75 mg total) by mouth 2 (two) times daily., Disp: 60 tablet, Rfl: 5 .  Eszopiclone 3 MG TABS, TAKE (1) TABLET BY MOUTH AT BEDTIME., Disp: 90 tablet, Rfl: 1 .  glucose blood test strip, Use as instructed, Disp: 50 each, Rfl: 5 .  HYDROcodone-acetaminophen (NORCO/VICODIN) 5-325 MG tablet, Take 1 tablet by mouth 2 (two) times daily as needed., Disp: 60 tablet, Rfl: 0 .  Omega-3 Fatty Acids (FISH OIL PO), Take 1 capsule by mouth daily. , Disp: , Rfl:    .  OVER THE COUNTER MEDICATION, Take 1 tablet by mouth daily. Vit d 3, Disp: , Rfl:  .  pantoprazole (PROTONIX) 40 MG tablet, TAKE ONE TABLET BY MOUTH ONCE DAILY., Disp: 30 tablet, Rfl: 5 .  quinapril (ACCUPRIL) 20 MG tablet, Take 1 tablet (20 mg total) by mouth 2 (two) times daily., Disp: 180 tablet, Rfl: 1 .  sitaGLIPtin (JANUVIA) 25 MG tablet, Take 1 tablet (25 mg total) by mouth daily., Disp: 30 tablet, Rfl: 5 .  TURMERIC PO, Take 1 capsule by mouth daily. , Disp: , Rfl:   SLEEP ARCHITECTURE The study was initiated at 11:08:31 PM and ended at 3:32:19 AM. Sleep onset time was 26.3 minutes and the sleep efficiency was 5.9%. The total sleep time was 15.5 minutes. Stage REM latency was N/A minutes. The patient spent 9.68% of the night in stage N1 sleep, 90.32% in stage N2 sleep, 0.00% in stage N3 and 0.00% in REM. Alpha intrusion was absent. Supine sleep was 0.00%. RESPIRATORY PARAMETERS The overall apnea/hypopnea index (AHI) was 0.0 per hour. There were 0 total apneas, including 0 obstructive, 0 central and 0 mixed apneas. There were 0 hypopneas and 0 RERAs. The AHI during Stage REM sleep was N/A per hour. AHI while supine was N/A per hour. The mean oxygen saturation was 91.98%. The minimum SpO2 during sleep was 89.00%. Moderate snoring was noted during this study. CARDIAC DATA The 2 lead EKG demonstrated sinus rhythm. The mean heart rate was N/A beats per minute. Other EKG findings include: PVCs. LEG MOVEMENT DATA The total PLMS were 0 with a resulting PLMS index of 0.00. Associated arousal with leg movement  index was 0.0.    IMPRESSIONS This study reveals very poor sleep efficiency. Because of this, it is impossible to rule out sleep disordered breathing. A repeat sleep study done with a hypnotic such as Ambien is suggested. Because of cost limitation, the repeat study is likely best carried out with a home sleep study.   Delano Metz, MD Diplomate, American Board of Sleep  Medicine.

## 2016-06-07 ENCOUNTER — Ambulatory Visit (INDEPENDENT_AMBULATORY_CARE_PROVIDER_SITE_OTHER): Payer: Medicare Other | Admitting: Cardiology

## 2016-06-07 ENCOUNTER — Encounter: Payer: Self-pay | Admitting: Cardiology

## 2016-06-07 VITALS — BP 122/82 | HR 88 | Ht 68.0 in | Wt 231.0 lb

## 2016-06-07 DIAGNOSIS — E782 Mixed hyperlipidemia: Secondary | ICD-10-CM

## 2016-06-07 DIAGNOSIS — I10 Essential (primary) hypertension: Secondary | ICD-10-CM | POA: Diagnosis not present

## 2016-06-07 DIAGNOSIS — E119 Type 2 diabetes mellitus without complications: Secondary | ICD-10-CM

## 2016-06-07 DIAGNOSIS — Z01810 Encounter for preprocedural cardiovascular examination: Secondary | ICD-10-CM | POA: Diagnosis not present

## 2016-06-07 DIAGNOSIS — R0602 Shortness of breath: Secondary | ICD-10-CM

## 2016-06-07 NOTE — Patient Instructions (Addendum)
Your physician wants you to follow-up in: to be determined after tests    Your physician has requested that you have a lexiscan myoview. For further information please visit [email or web address]. Please follow instruction sheet, as given.   Your physician has requested that you have an echocardiogram. Echocardiography is a painless test that uses sound waves to create images of your heart. It provides your doctor with information about the size and shape of your heart and how well your heart's chambers and valves are working. This procedure takes approximately one hour. There are no restrictions for this procedure.      Your physician recommends that you continue on your current medications as directed. Please refer to the Current Medication list given to you today.      Thank you for choosing [facility] !

## 2016-06-07 NOTE — Progress Notes (Signed)
Cardiology Office Note  Date: 06/07/2016   ID: Deanna Salinas, DOB 1935-08-24, MRN [id number]  PCP: Deanna Lange, MD  Consulting Cardiologist: Deanna Lesches, MD   Chief Complaint  Patient presents with  . Preoperative evaluation    History of Present Illness: Deanna Salinas is a 80 y.o. female referred for cardiology consultation by Dr. Wolfgang Salinas. She is being considered for possible left knee replacement by Dr. Mardelle Salinas in [place] and is referred as part of a preoperative evaluation. She states she had a recent joint injection and is doing better, but will have a follow-up in the next few months.  She does not report any exertional chest pain, does describe intermittent dyspnea on exertion and fatigue. She relates a lot of this to poor sleep. She does do water aerobics 3 days a week at the [facility]. At home she sometimes has difficulty walking up her steps due to shortness of breath.  She underwent a sleep study read by Dr. Merlene Salinas just recently indicating poor sleep efficiency but not necessarily diagnosing sleep apnea. It sounds like further testing may be planned.  I reviewed her ECG today which shows sinus rhythm with decreased R wave progression and low voltage. We also went over her medications.  She has not undergone any recent ischemic evaluations.  Past Medical History:  Diagnosis Date  . Essential hypertension   . Fatty liver   . Hyperlipidemia   . Type 2 diabetes mellitus ([place])     Past Surgical History:  Procedure Laterality Date  . APPENDECTOMY     At the time of hysterectomy  . CESAREAN SECTION     X5  . COLONOSCOPY    . COLONOSCOPY N/A 06/11/2013   Colonic diverticulosis, hyperplastic polyps. no further screening colonoscopies recommended  . ESOPHAGOGASTRODUODENOSCOPY N/A 02/20/2014   Procedure: ESOPHAGOGASTRODUODENOSCOPY (EGD);  Surgeon: Deanna Dolin, MD;  Location: [facility];  Service: Endoscopy;  Laterality: N/A;  11:00    Current Outpatient  Prescriptions  Medication Sig Dispense Refill  . amitriptyline (ELAVIL) 50 MG tablet Take 50 mg by mouth at bedtime.    . diclofenac (VOLTAREN) 75 MG EC tablet Take 1 tablet (75 mg total) by mouth 2 (two) times daily. 60 tablet 5  . Eszopiclone 3 MG TABS TAKE (1) TABLET BY MOUTH AT BEDTIME. 90 tablet 1  . glucose blood test strip Use as instructed 50 each 5  . HYDROcodone-acetaminophen (NORCO/VICODIN) 5-325 MG tablet Take 1 tablet by mouth 2 (two) times daily as needed. 60 tablet 0  . naproxen sodium (ANAPROX) 220 MG tablet Take 220 mg by mouth 2 (two) times daily with a meal.    . Omega-3 Fatty Acids (FISH OIL PO) Take 1 capsule by mouth daily.     Marland Kitchen OVER THE COUNTER MEDICATION Take 1 tablet by mouth daily. Vit d 3    . pantoprazole (PROTONIX) 40 MG tablet TAKE ONE TABLET BY MOUTH ONCE DAILY. 30 tablet 5  . quinapril (ACCUPRIL) 20 MG tablet Take 1 tablet (20 mg total) by mouth 2 (two) times daily. 180 tablet 1  . TURMERIC PO Take 1 capsule by mouth daily.      No current facility-administered medications for this visit.    Allergies:  Ambien [zolpidem tartrate]; Lipitor [atorvastatin]; Metformin and related; Pravastatin; and Trazodone and nefazodone   Social History: The patient  reports that she has never smoked. She has never used smokeless tobacco. She reports that she drinks about 1.8 oz of alcohol per week .  She reports that she does not use drugs.   Family History: The patient's family history includes Hypertension in her father, other, and other.   ROS:  Please see the history of present illness. Otherwise, complete review of systems is positive for decreased hearing, uses hearing aids.  All other systems are reviewed and negative.   Physical Exam: VS:  BP 122/82 (BP Location: Right Arm)   Pulse 88   Ht 5\' 8"  (1.727 m)   Wt 231 lb (104.8 kg)   SpO2 96%   BMI 35.12 kg/m , BMI Body mass index is 35.12 kg/m.  Wt Readings from Last 3 Encounters:  06/07/16 231 lb (104.8 kg)    03/02/16 232 lb (105.2 kg)  01/29/16 234 lb (106.1 kg)    General: Overweight woman, appears comfortable at rest. HEENT: Conjunctiva and lids normal, oropharynx clear. Neck: Supple, no elevated JVP or carotid bruits, no thyromegaly. Lungs: Clear to auscultation, nonlabored breathing at rest. Cardiac: Regular rate and rhythm, S4, soft systolic murmur, no pericardial rub. Abdomen: Soft, nontender, bowel sounds present, no guarding or rebound. Extremities: No pitting edema, distal pulses 2+. Skin: Warm and dry. Musculoskeletal: No kyphosis. Neuropsychiatric: Alert and oriented x3, affect grossly appropriate.  ECG: No prior tracing for comparison.  Recent Labwork: 06/18/2015: Hemoglobin 13.5; Platelets 270 04/23/2016: ALT 25; AST 21; BNP 21.1; BUN 22; Creatinine, Ser 1.11; Potassium 5.4; Sodium 140     Component Value Date/Time   CHOL 208 (H) 04/23/2016 1056   TRIG 132 04/23/2016 1056   HDL 49 04/23/2016 1056   CHOLHDL 4.2 04/23/2016 1056   CHOLHDL 4.2 09/02/2014 1125   VLDL 31 09/02/2014 1125   LDLCALC 133 (H) 04/23/2016 1056   Assessment and Plan:  1. Preoperative evaluation in a 80 year old woman with hypertension, hyperlipidemia, and type 2 diabetes mellitus. Baseline ECG is nonspecific as outlined. She does report intermittent fatigue and dyspnea on exertion but no obvious angina symptoms. She is being considered for possible left knee replacement under general anesthesia. We will plan further cardiac evaluation including echocardiogram and a Lexiscan Myoview for more objective assessment.  2. Essential hypertension, blood pressure is well controlled today. She is on Accupril.  3. History of hyperlipidemia, recent LDL 133. She takes omega-3 supplements.  4. Type 2 diabetes mellitus, follows with Dr. Wolfgang Salinas. She is not on insulin.  Current medicines were reviewed with the patient today.   Orders Placed This Encounter  Procedures  . NM Myocar Multi W/Spect W/Wall Motion /  EF  . EKG 12-Lead  . ECHOCARDIOGRAM COMPLETE    Disposition: Call with test results.  Signed, Satira Sark, MD, [facility] 06/07/2016 2:10 PM    [facility] [address] [place], [place] [address] Phone: [phone]; Fax: [phone]

## 2016-06-08 ENCOUNTER — Telehealth: Payer: Self-pay | Admitting: *Deleted

## 2016-06-08 NOTE — Telephone Encounter (Signed)
Note in nurse's basket to check on sleep study results. Results are in pt's chart under notes

## 2016-06-13 NOTE — Telephone Encounter (Signed)
The results of the sleep study are very inconclusive.(She only slept about 6% of the time that she was measured therefore they could not figure out if she has sleep apnea) Unfortunately the patient did not sleep well while doing this study. At this point she may well benefit from having a home sleep study or a repeat sleep study but this time taking a medication such as Ambien to help her sleep so this study can accurately gauge the level of her possible sleep apnea. Please discuss with the patient to see what she would like to do regarding this. Our options are reorder the sleep study with Ambien prescribed to take beforehand or pursue a home sleep study with the same approach. Obviously she can choose to do nothing but we would not be able to tell if she does or does not have sleep apnea thank you

## 2016-06-14 NOTE — Telephone Encounter (Signed)
Left message on voicemail to return call.

## 2016-06-21 ENCOUNTER — Ambulatory Visit (HOSPITAL_BASED_OUTPATIENT_CLINIC_OR_DEPARTMENT_OTHER)
Admission: RE | Admit: 2016-06-21 | Discharge: 2016-06-21 | Disposition: A | Discharge status: Home / Self Care | Payer: Medicare Other | Source: Ambulatory Visit | Attending: Cardiology | Admitting: Cardiology

## 2016-06-21 ENCOUNTER — Encounter (HOSPITAL_COMMUNITY)
Admission: RE | Admit: 2016-06-21 | Discharge: 2016-06-21 | Disposition: A | Discharge status: Home / Self Care | Payer: Medicare Other | Source: Ambulatory Visit | Attending: Cardiology | Admitting: Cardiology

## 2016-06-21 ENCOUNTER — Inpatient Hospital Stay (HOSPITAL_COMMUNITY): Admission: RE | Admit: 2016-06-21 | Payer: Medicare Other | Source: Ambulatory Visit

## 2016-06-21 ENCOUNTER — Encounter (HOSPITAL_COMMUNITY): Payer: Self-pay

## 2016-06-21 DIAGNOSIS — I5030 Unspecified diastolic (congestive) heart failure: Secondary | ICD-10-CM

## 2016-06-21 DIAGNOSIS — Z01810 Encounter for preprocedural cardiovascular examination: Secondary | ICD-10-CM | POA: Insufficient documentation

## 2016-06-21 DIAGNOSIS — R0602 Shortness of breath: Secondary | ICD-10-CM | POA: Diagnosis not present

## 2016-06-21 DIAGNOSIS — I313 Pericardial effusion (noninflammatory): Secondary | ICD-10-CM

## 2016-06-21 LAB — ECHOCARDIOGRAM COMPLETE
E decel time: 173 msec
E/e' ratio: 9.42
FS: 38 % (ref 28–44)
IVS/LV PW RATIO, ED: 1.17
LA ID, A-P, ES: 32 mm
LA diam end sys: 32 mm
LA diam index: 1.4 cm/m2
LA vol A4C: 33.5 ml
LA vol index: 16 mL/m2
LA vol: 36.6 mL
LV E/e' medial: 9.42
LV E/e'average: 9.42
LV PW d: 11 mm — AB (ref 0.6–1.1)
LV dias vol index: 23 mL/m2
LV dias vol: 53 mL (ref 46–106)
LV e' LATERAL: 7.62 cm/s
LV sys vol index: 10 mL/m2
LV sys vol: 24 mL (ref 14–42)
LVOT SV: 75 mL
LVOT VTI: 23.8 cm
LVOT area: 3.14 cm2
LVOT diameter: 20 mm
LVOT peak grad rest: 4 mmHg
LVOT peak vel: 96.4 cm/s
Lateral S' vel: 11.1 cm/s
MV Dec: 173
MV Peak grad: 2 mmHg
MV pk A vel: 90.9 m/s
MV pk E vel: 71.8 m/s
Simpson's disk: 55
Stroke v: 29 ml
TAPSE: 26.5 mm
TDI e' lateral: 7.62
TDI e' medial: 5.11

## 2016-06-21 LAB — NM MYOCAR MULTI W/SPECT W/WALL MOTION / EF
LV dias vol: 93 mL (ref 46–106)
LV sys vol: 51 mL
Peak HR: 105 {beats}/min
RATE: 0.1
Rest HR: 82 {beats}/min
SDS: 3
SRS: 3
SSS: 6
TID: 1.21

## 2016-06-21 MED ORDER — TECHNETIUM TC 99M TETROFOSMIN IV KIT
30.0000 | PACK | Freq: Once | INTRAVENOUS | Status: AC | PRN
  Administered 2016-06-21: 32 via INTRAVENOUS

## 2016-06-21 MED ORDER — TECHNETIUM TC 99M TETROFOSMIN IV KIT
10.0000 | PACK | Freq: Once | INTRAVENOUS | Status: AC | PRN
  Administered 2016-06-21: 10 via INTRAVENOUS

## 2016-06-21 MED ORDER — SODIUM CHLORIDE 0.9% FLUSH
INTRAVENOUS | Status: AC
  Administered 2016-06-21: 10 mL via INTRAVENOUS
  Filled 2016-06-21: qty 10

## 2016-06-21 MED ORDER — REGADENOSON 0.4 MG/5ML IV SOLN
INTRAVENOUS | Status: AC
  Administered 2016-06-21: 0.4 mg via INTRAVENOUS
  Filled 2016-06-21: qty 5

## 2016-06-21 NOTE — Progress Notes (Signed)
*  PRELIMINARY RESULTS* Echocardiogram 2D Echocardiogram has been performed.  Deanna Salinas 06/21/2016, 10:15 AM

## 2016-06-24 ENCOUNTER — Telehealth: Payer: Self-pay | Admitting: *Deleted

## 2016-06-24 NOTE — Telephone Encounter (Signed)
-----   Message from Satira Sark, MD sent at 06/21/2016  5:53 PM EST ----- Results reviewed. Low risk study with breast attenuation (artifact), no ischemia to suggest significant obstructive CAD. We know that her LVEF is normal by echocardiogram (reported separately). She should be able to proceed with knee replacement at an acceptable perioperative cardiac risk (low in general). Please forward this to surgeon as well. A copy of this test should be forwarded to Sallee Lange, MD.

## 2016-06-24 NOTE — Telephone Encounter (Signed)
Called patient with test results. No answer. Left message to call back.  

## 2016-06-24 NOTE — Telephone Encounter (Signed)
Left message return call 06/24/16 

## 2016-07-05 ENCOUNTER — Ambulatory Visit (INDEPENDENT_AMBULATORY_CARE_PROVIDER_SITE_OTHER): Payer: Medicare Other | Admitting: Family Medicine

## 2016-07-05 ENCOUNTER — Encounter: Payer: Self-pay | Admitting: Family Medicine

## 2016-07-05 VITALS — BP 128/86 | Ht 68.0 in | Wt 240.2 lb

## 2016-07-05 DIAGNOSIS — I10 Essential (primary) hypertension: Secondary | ICD-10-CM

## 2016-07-05 DIAGNOSIS — G4700 Insomnia, unspecified: Secondary | ICD-10-CM | POA: Diagnosis not present

## 2016-07-05 DIAGNOSIS — R600 Localized edema: Secondary | ICD-10-CM | POA: Diagnosis not present

## 2016-07-05 MED ORDER — POTASSIUM CHLORIDE CRYS ER 20 MEQ PO TBCR: 20.0000 meq | EXTENDED_RELEASE_TABLET | Freq: Every day | ORAL | 2 refills | Status: DC

## 2016-07-05 MED ORDER — FUROSEMIDE 20 MG PO TABS: 20.0000 mg | ORAL_TABLET | Freq: Every day | ORAL | 2 refills | Status: DC

## 2016-07-05 NOTE — Progress Notes (Signed)
   Subjective:    Patient ID: Deanna Salinas, female    DOB: 03-21-1936, 80 y.o.   MRN: [id number]  HPI Patient arrives to discuss recent lab results and cardiology work up and sleep study for upcoming knee replacement surgery.   Patient with c/o sever swelling in her legs and feet-has gained 10 lbsShe denies any shortness of breath with it but she does relate how she has had some swelling in the legs. This is unusual for her. She states her usual energy level is okay. She denies any PND or orthopnea.  She has seen cardiology they did a Myoview which was normal echo showed a good ejection fraction but did show grade 1 diastolic dysfunction  Has history hypertension takes her medicine on a regular basis   Review of Systems  Constitutional: Negative for activity change, fatigue and fever.  Respiratory: Negative for cough and shortness of breath.   Cardiovascular: Negative for chest pain and leg swelling.  Neurological: Negative for headaches.       Objective:   Physical Exam  Constitutional: She appears well-nourished. No distress.  Cardiovascular: Normal rate, regular rhythm and normal heart sounds.   No murmur heard. Pulmonary/Chest: Effort normal and breath sounds normal. No respiratory distress.  Musculoskeletal: She exhibits edema.  Lymphadenopathy:    She has no cervical adenopathy.  Neurological: She is alert. She exhibits normal muscle tone.  Psychiatric: Her behavior is normal.  Vitals reviewed.  Bilateral pedal edema is noted. Worse all the way up to the lower calf  From a surgical standpoint the patient does have cardiac clearance. Await the results of sleep evaluation from Dr. Annamaria Boots.  25 minutes was spent with the patient. Greater than half the time was spent in discussion and answering questions and counseling regarding the issues that the patient came in for today. Greater than half the time was spent discussing the results of the tests and her pedal edema and  evaluation of this.     Assessment & Plan:  Pedal edema-diuretic along with potassium on a daily basis until under control then use it 3 days a week or 2 days week as needed to keep it under control  Lab work ordered to help rule out any possibility of CHF  Referral to sleep specialist because of severe insomnia. Patient also has interrupted sleep and has risk factors for sleep apnea. Orthopedist 1 and to make sure patient doesn't have sleep apnea before having surgery. Unfortunately her sleep study she did at the hospital was unable to tell because his poor sleep quality

## 2016-07-05 NOTE — Patient Instructions (Signed)
Use one lasix and 1 potassium each am for the swelling  Once swelling is down then may reduce to using this Molli Knock Wed  And Friday  Recheck here in 6 weeks  Do your labs  We will set you up with sleep doctor

## 2016-07-06 ENCOUNTER — Other Ambulatory Visit: Payer: Self-pay | Admitting: Family Medicine

## 2016-07-06 ENCOUNTER — Encounter: Payer: Self-pay | Admitting: Family Medicine

## 2016-07-06 LAB — BRAIN NATRIURETIC PEPTIDE: BNP: 19.6 pg/mL (ref 0.0–100.0)

## 2016-07-06 LAB — BASIC METABOLIC PANEL
BUN/Creatinine Ratio: 25 (ref 12–28)
BUN: 24 mg/dL (ref 8–27)
CO2: 28 mmol/L (ref 18–29)
Calcium: 9.6 mg/dL (ref 8.7–10.3)
Chloride: 97 mmol/L (ref 96–106)
Creatinine, Ser: 0.96 mg/dL (ref 0.57–1.00)
GFR calc Af Amer: 66 mL/min/{1.73_m2} (ref >59)
GFR calc non Af Amer: 57 mL/min/{1.73_m2} — ABNORMAL LOW (ref >59)
Glucose: 171 mg/dL — ABNORMAL HIGH (ref 65–99)
Potassium: 5.1 mmol/L (ref 3.5–5.2)
Sodium: 141 mmol/L (ref 134–144)

## 2016-07-06 NOTE — Telephone Encounter (Signed)
Refill each 12

## 2016-07-14 DIAGNOSIS — M170 Bilateral primary osteoarthritis of knee: Secondary | ICD-10-CM | POA: Diagnosis not present

## 2016-07-28 ENCOUNTER — Other Ambulatory Visit: Payer: Self-pay | Admitting: Family Medicine

## 2016-07-29 ENCOUNTER — Encounter: Payer: Self-pay | Admitting: Pediatrics

## 2016-07-29 NOTE — Telephone Encounter (Signed)
This encounter was created in error - please disregard.

## 2016-08-17 ENCOUNTER — Other Ambulatory Visit: Payer: Self-pay | Admitting: *Deleted

## 2016-08-17 ENCOUNTER — Other Ambulatory Visit: Payer: Self-pay | Admitting: Family Medicine

## 2016-08-17 MED ORDER — HYDROCODONE-ACETAMINOPHEN 5-325 MG PO TABS: 1.0000 | ORAL_TABLET | Freq: Two times a day (BID) | ORAL | 0 refills | Status: DC | PRN

## 2016-08-17 NOTE — Telephone Encounter (Signed)
Patient may have one refill

## 2016-08-19 ENCOUNTER — Telehealth: Payer: Self-pay | Admitting: Family Medicine

## 2016-08-19 ENCOUNTER — Other Ambulatory Visit: Payer: Self-pay | Admitting: Family Medicine

## 2016-08-19 NOTE — Telephone Encounter (Signed)
Pt is requesting a refill on hydrocodone.

## 2016-08-19 NOTE — Telephone Encounter (Signed)
RX sent on 08/17/16

## 2016-08-19 NOTE — Telephone Encounter (Signed)
Rx sent 08/17/16. I advised patient rx already sent.

## 2016-08-30 ENCOUNTER — Telehealth: Payer: Self-pay | Admitting: Family Medicine

## 2016-08-30 NOTE — Telephone Encounter (Signed)
Pt notified script is still at front window from jan 9th. Pt never picked up. Pt states she will get today.

## 2016-08-30 NOTE — Telephone Encounter (Signed)
Requesting Rx for hydrocodone.

## 2016-09-01 ENCOUNTER — Other Ambulatory Visit: Payer: Self-pay | Admitting: Family Medicine

## 2016-09-01 NOTE — Telephone Encounter (Signed)
Last appt 07/05/16 w/ RTO suggested for 6 weeks. Please advise

## 2016-09-01 NOTE — Telephone Encounter (Signed)
May have each with four additional refills

## 2016-10-20 DIAGNOSIS — M170 Bilateral primary osteoarthritis of knee: Secondary | ICD-10-CM | POA: Diagnosis not present

## 2016-11-15 ENCOUNTER — Other Ambulatory Visit: Payer: Self-pay | Admitting: Family Medicine

## 2016-11-15 MED ORDER — HYDROCODONE-ACETAMINOPHEN 5-325 MG PO TABS: 1.0000 | ORAL_TABLET | Freq: Two times a day (BID) | ORAL | 0 refills | Status: DC | PRN

## 2016-11-15 NOTE — Telephone Encounter (Signed)
Patient requesting refill on hydrocodone 5/325 °

## 2016-11-15 NOTE — Telephone Encounter (Signed)
Patient last seen 06/2016  And last prescribed hydrocodone via telephone message 08/2016

## 2016-11-15 NOTE — Telephone Encounter (Signed)
Prescription upfront for pick up. Patient notified. 

## 2016-11-15 NOTE — Telephone Encounter (Signed)
She may have a refill of this medicine. She will need to do a follow-up office visit this spring

## 2016-12-06 ENCOUNTER — Telehealth: Payer: Self-pay | Admitting: Family Medicine

## 2016-12-06 DIAGNOSIS — E785 Hyperlipidemia, unspecified: Secondary | ICD-10-CM

## 2016-12-06 DIAGNOSIS — E119 Type 2 diabetes mellitus without complications: Secondary | ICD-10-CM

## 2016-12-06 DIAGNOSIS — Z79899 Other long term (current) drug therapy: Secondary | ICD-10-CM

## 2016-12-06 NOTE — Telephone Encounter (Signed)
Lipid, liver, hemoglobin B5A, metabolic 7

## 2016-12-06 NOTE — Telephone Encounter (Signed)
bloodwork orders ready. Pt notified.  

## 2016-12-06 NOTE — Telephone Encounter (Signed)
Patient has an appt this Friday with Dr. Nicki Reaper.  Does she need labs done prior to appt? If so, she will need an order put in for [facility]. Please let her know. thanks

## 2016-12-08 DIAGNOSIS — E119 Type 2 diabetes mellitus without complications: Secondary | ICD-10-CM | POA: Diagnosis not present

## 2016-12-08 DIAGNOSIS — E785 Hyperlipidemia, unspecified: Secondary | ICD-10-CM | POA: Diagnosis not present

## 2016-12-08 DIAGNOSIS — Z79899 Other long term (current) drug therapy: Secondary | ICD-10-CM | POA: Diagnosis not present

## 2016-12-09 LAB — BASIC METABOLIC PANEL
BUN/Creatinine Ratio: 22 (ref 12–28)
BUN: 24 mg/dL (ref 8–27)
CO2: 29 mmol/L (ref 18–29)
Calcium: 10 mg/dL (ref 8.7–10.3)
Chloride: 96 mmol/L (ref 96–106)
Creatinine, Ser: 1.08 mg/dL — ABNORMAL HIGH (ref 0.57–1.00)
GFR calc Af Amer: 56 mL/min/{1.73_m2} — ABNORMAL LOW (ref >59)
GFR calc non Af Amer: 49 mL/min/{1.73_m2} — ABNORMAL LOW (ref >59)
Glucose: 148 mg/dL — ABNORMAL HIGH (ref 65–99)
Potassium: 4.9 mmol/L (ref 3.5–5.2)
Sodium: 139 mmol/L (ref 134–144)

## 2016-12-09 LAB — LIPID PANEL
Chol/HDL Ratio: 4 ratio (ref 0.0–4.4)
Cholesterol, Total: 186 mg/dL (ref 100–199)
HDL: 47 mg/dL (ref >39)
LDL Calculated: 110 mg/dL — ABNORMAL HIGH (ref 0–99)
Triglycerides: 145 mg/dL (ref 0–149)
VLDL Cholesterol Cal: 29 mg/dL (ref 5–40)

## 2016-12-09 LAB — HEPATIC FUNCTION PANEL
ALT: 21 IU/L (ref 0–32)
AST: 17 IU/L (ref 0–40)
Albumin: 4.2 g/dL (ref 3.5–4.8)
Alkaline Phosphatase: 93 IU/L (ref 39–117)
Bilirubin Total: 0.3 mg/dL (ref 0.0–1.2)
Bilirubin, Direct: 0.11 mg/dL (ref 0.00–0.40)
Total Protein: 6.8 g/dL (ref 6.0–8.5)

## 2016-12-09 LAB — HEMOGLOBIN A1C
Est. average glucose Bld gHb Est-mCnc: 169 mg/dL
Hgb A1c MFr Bld: 7.5 % — ABNORMAL HIGH (ref 4.8–5.6)

## 2016-12-10 ENCOUNTER — Encounter: Payer: Self-pay | Admitting: Family Medicine

## 2016-12-10 ENCOUNTER — Ambulatory Visit (INDEPENDENT_AMBULATORY_CARE_PROVIDER_SITE_OTHER): Payer: Medicare Other | Admitting: Family Medicine

## 2016-12-10 VITALS — BP 130/80 | Ht 68.0 in | Wt 239.0 lb

## 2016-12-10 DIAGNOSIS — R5383 Other fatigue: Secondary | ICD-10-CM

## 2016-12-10 DIAGNOSIS — R600 Localized edema: Secondary | ICD-10-CM

## 2016-12-10 DIAGNOSIS — E784 Other hyperlipidemia: Secondary | ICD-10-CM | POA: Diagnosis not present

## 2016-12-10 DIAGNOSIS — E7849 Other hyperlipidemia: Secondary | ICD-10-CM

## 2016-12-10 DIAGNOSIS — E119 Type 2 diabetes mellitus without complications: Secondary | ICD-10-CM

## 2016-12-10 DIAGNOSIS — G4700 Insomnia, unspecified: Secondary | ICD-10-CM | POA: Diagnosis not present

## 2016-12-10 DIAGNOSIS — I10 Essential (primary) hypertension: Secondary | ICD-10-CM | POA: Diagnosis not present

## 2016-12-10 MED ORDER — ACARBOSE 100 MG PO TABS: 100.0000 mg | ORAL_TABLET | Freq: Three times a day (TID) | ORAL | 4 refills | Status: DC

## 2016-12-10 NOTE — Patient Instructions (Signed)
iabetes Mellitus and Food It is important for you to manage your blood sugar (glucose) level. Your blood glucose level can be greatly affected by what you eat. Eating healthier foods in the appropriate amounts throughout the day at about the same time each day will help you control your blood glucose level. It can also help slow or prevent worsening of your diabetes mellitus. Healthy eating may even help you improve the level of your blood pressure and reach or maintain a healthy weight. General recommendations for healthful eating and cooking habits include:  Eating meals and snacks regularly. Avoid going long periods of time without eating to lose weight.  Eating a diet that consists mainly of plant-based foods, such as fruits, vegetables, nuts, legumes, and whole grains.  Using low-heat cooking methods, such as baking, instead of high-heat cooking methods, such as deep frying. Work with your dietitian to make sure you understand how to use the Nutrition Facts information on food labels. How can food affect me? Carbohydrates  Carbohydrates affect your blood glucose level more than any other type of food. Your dietitian will help you determine how many carbohydrates to eat at each meal and teach you how to count carbohydrates. Counting carbohydrates is important to keep your blood glucose at a healthy level, especially if you are using insulin or taking certain medicines for diabetes mellitus. Alcohol  Alcohol can cause sudden decreases in blood glucose (hypoglycemia), especially if you use insulin or take certain medicines for diabetes mellitus. Hypoglycemia can be a life-threatening condition. Symptoms of hypoglycemia (sleepiness, dizziness, and disorientation) are similar to symptoms of having too much alcohol. If your health care provider has given you approval to drink alcohol, do so in moderation and use the following guidelines:  Women should not have more than one drink per day, and men  should not have more than two drinks per day. One drink is equal to:  12 oz of beer.  5 oz of wine.  1 oz of hard liquor.  Do not drink on an empty stomach.  Keep yourself hydrated. Have water, diet soda, or unsweetened iced tea.  Regular soda, juice, and other mixers might contain a lot of carbohydrates and should be counted. What foods are not recommended? As you make food choices, it is important to remember that all foods are not the same. Some foods have fewer nutrients per serving than other foods, even though they might have the same number of calories or carbohydrates. It is difficult to get your body what it needs when you eat foods with fewer nutrients. Examples of foods that you should avoid that are high in calories and carbohydrates but low in nutrients include:  Trans fats (most processed foods list trans fats on the Nutrition Facts label).  Regular soda.  Juice.  Candy.  Sweets, such as cake, pie, doughnuts, and cookies.  Fried foods. What foods can I eat? Eat nutrient-rich foods, which will nourish your body and keep you healthy. The food you should eat also will depend on several factors, including:  The calories you need.  The medicines you take.  Your weight.  Your blood glucose level.  Your blood pressure level.  Your cholesterol level. You should eat a variety of foods, including:  Protein.  Lean cuts of meat.  Proteins low in saturated fats, such as fish, egg whites, and beans. Avoid processed meats.  Fruits and vegetables.  Fruits and vegetables that may help control blood glucose levels, such as apples, mangoes, and  mangoes, and yams.  Dairy products. ? Choose fat-free or low-fat dairy products, such as milk, yogurt, and cheese.  Grains, bread, pasta, and rice. ? Choose whole grain products, such as multigrain bread, whole oats, and brown rice. These foods may help control blood pressure.  Fats. ? Foods containing healthful fats, such as  nuts, avocado, olive oil, canola oil, and fish.  Does everyone with diabetes mellitus have the same meal plan? Because every person with diabetes mellitus is different, there is not one meal plan that works for everyone. It is very important that you meet with a dietitian who will help you create a meal plan that is just right for you. This information is not intended to replace advice given to you by your health care provider. Make sure you discuss any questions you have with your health care provider. Document Released: 04/22/2005 Document Revised: 01/01/2016 Document Reviewed: 06/22/2013 Elsevier Interactive Patient Education  2017 [employer]  

## 2016-12-10 NOTE — Progress Notes (Signed)
   Subjective:    Patient ID: Deanna Salinas, female    DOB: Oct 02, 1935, 81 y.o.   MRN: [id number]  Diabetes  She presents for her follow-up diabetic visit. She has type 2 diabetes mellitus. Pertinent negatives for hypoglycemia include no confusion. Pertinent negatives for diabetes include no chest pain, no fatigue, no polydipsia, no polyphagia and no weakness. Eye exam is not current (appointment coming up).   Patient has concerns of fatigue. She states she's not able to do as much as she used to be able to do She occasionally has take hydrocodone for her back and her knees She takes her reflux medicine keeps her reflux under good control She does take her anti-inflammatory for knees denies any complications from She does have difficult time sleeping. She takes multiple medicines to help her. She is been on amitriptyline for years. She also uses a sleeping pill as well  Review of Systems  Constitutional: Negative for activity change, appetite change and fatigue.  HENT: Negative for congestion.   Respiratory: Negative for cough.   Cardiovascular: Negative for chest pain.  Gastrointestinal: Negative for abdominal pain.  Endocrine: Negative for polydipsia and polyphagia.  Neurological: Negative for weakness.  Psychiatric/Behavioral: Negative for confusion.   Negative rectal bleeding    Objective:   Physical Exam  Constitutional: She appears well-nourished. No distress.  Cardiovascular: Normal rate, regular rhythm and normal heart sounds.   No murmur heard. Pulmonary/Chest: Effort normal and breath sounds normal. No respiratory distress.  Musculoskeletal: She exhibits no edema.  Lymphadenopathy:    She has no cervical adenopathy.  Neurological: She is alert. She exhibits normal muscle tone.  Psychiatric: Her behavior is normal.  Vitals reviewed.         Assessment & Plan:  Significant fatigue check lab work. I don't find anything obvious this could be age and  deconditioning Blood pressure good control continue current measures watch diet Significant obesity patient was encouraged to try to lose weight although this will be difficult at her age Type 2 diabetes. Patient is to watch her diet closely. We went ahead and started Precose to be taken before meals-I truly don't want to start metformin with her creatinine function Minimal edema she is using her diuretic continue this I recommend using it at least twice a week currently patient is not using it She will follow-up within 4 months to check an A1c Morbid obesity patient was encouraged to lose weight she is over 35 BMI with significant comorbidities

## 2016-12-11 LAB — CBC WITH DIFFERENTIAL/PLATELET
Basophils Absolute: 0.1 10*3/uL (ref 0.0–0.2)
Basos: 1 %
EOS (ABSOLUTE): 0.4 10*3/uL (ref 0.0–0.4)
Eos: 3 %
Hematocrit: 41 % (ref 34.0–46.6)
Hemoglobin: 13.3 g/dL (ref 11.1–15.9)
Immature Grans (Abs): 0 10*3/uL (ref 0.0–0.1)
Immature Granulocytes: 0 %
Lymphocytes Absolute: 3.3 10*3/uL — ABNORMAL HIGH (ref 0.7–3.1)
Lymphs: 26 %
MCH: 30.1 pg (ref 26.6–33.0)
MCHC: 32.4 g/dL (ref 31.5–35.7)
MCV: 93 fL (ref 79–97)
Monocytes Absolute: 1.1 10*3/uL — ABNORMAL HIGH (ref 0.1–0.9)
Monocytes: 8 %
Neutrophils Absolute: 8 10*3/uL — ABNORMAL HIGH (ref 1.4–7.0)
Neutrophils: 62 %
Platelets: 276 10*3/uL (ref 150–379)
RBC: 4.42 x10E6/uL (ref 3.77–5.28)
RDW: 12.8 % (ref 12.3–15.4)
WBC: 12.9 10*3/uL — ABNORMAL HIGH (ref 3.4–10.8)

## 2016-12-11 LAB — TSH: TSH: 4.56 u[IU]/mL — ABNORMAL HIGH (ref 0.450–4.500)

## 2016-12-15 NOTE — Addendum Note (Signed)
Addended by: Dairl Ponder on: 12/15/2016 11:47 AM   Modules accepted: Orders

## 2016-12-29 ENCOUNTER — Other Ambulatory Visit: Payer: Self-pay | Admitting: Family Medicine

## 2016-12-29 NOTE — Telephone Encounter (Signed)
Last seen 12/14/16 May we refill?

## 2017-01-21 ENCOUNTER — Other Ambulatory Visit: Payer: Self-pay | Admitting: *Deleted

## 2017-01-21 MED ORDER — QUINAPRIL HCL 20 MG PO TABS: 20.0000 mg | ORAL_TABLET | Freq: Two times a day (BID) | ORAL | 1 refills | Status: DC

## 2017-01-25 ENCOUNTER — Other Ambulatory Visit: Payer: Self-pay | Admitting: *Deleted

## 2017-01-25 ENCOUNTER — Telehealth: Payer: Self-pay | Admitting: Family Medicine

## 2017-01-25 MED ORDER — HYDROCODONE-ACETAMINOPHEN 5-325 MG PO TABS: 1.0000 | ORAL_TABLET | Freq: Two times a day (BID) | ORAL | 0 refills | Status: DC | PRN

## 2017-01-25 NOTE — Telephone Encounter (Signed)
Pt is requesting a refill on HYDROcodone-acetaminophen (NORCO/VICODIN) 5-325 MG tablet   

## 2017-01-25 NOTE — Telephone Encounter (Signed)
Pt notified on voicemail that rx requested is ready for pickup.

## 2017-01-25 NOTE — Telephone Encounter (Signed)
Patient may have refill

## 2017-03-28 ENCOUNTER — Telehealth: Payer: Self-pay | Admitting: Family Medicine

## 2017-03-28 MED ORDER — HYDROCODONE-ACETAMINOPHEN 5-325 MG PO TABS: 1.0000 | ORAL_TABLET | Freq: Two times a day (BID) | ORAL | 0 refills | Status: DC | PRN

## 2017-03-28 NOTE — Telephone Encounter (Signed)
She may have a prescription for 60 tablets but she also needs to schedule follow-up office visit within 30 days

## 2017-03-28 NOTE — Telephone Encounter (Signed)
Left message return call 03/28/17 (prescription printed)

## 2017-03-28 NOTE — Telephone Encounter (Signed)
Spoke with patient and informed her per Dr.Scott Luking- you may have a prescription for 60 tablets but you will need to schedule a follow up within 30 days. Patient verbalized understanding.

## 2017-03-28 NOTE — Telephone Encounter (Signed)
Requesting Rx for HYDROcodone-acetaminophen (NORCO/VICODIN) 5-325 MG tablet

## 2017-03-29 ENCOUNTER — Telehealth: Payer: Self-pay | Admitting: Family Medicine

## 2017-03-29 DIAGNOSIS — E119 Type 2 diabetes mellitus without complications: Secondary | ICD-10-CM

## 2017-03-29 DIAGNOSIS — D72829 Elevated white blood cell count, unspecified: Secondary | ICD-10-CM

## 2017-03-29 DIAGNOSIS — E039 Hypothyroidism, unspecified: Secondary | ICD-10-CM

## 2017-03-29 NOTE — Telephone Encounter (Signed)
Patient has an appointment on 04/20/17 with Dr. Nicki Reaper.  She wants to know if she is due for labs.

## 2017-03-29 NOTE — Telephone Encounter (Signed)
Urine ACR, TSH, CBC, A1c-diabetes, hypothyroidism, leukocytosis-bring medications to office visit

## 2017-03-30 NOTE — Telephone Encounter (Signed)
Left message return call 03/30/17

## 2017-03-30 NOTE — Telephone Encounter (Signed)
Spoke with patient and informed her per [place] have been ordered for your upcoming appointment. Patient verbalized understanding.

## 2017-03-31 ENCOUNTER — Other Ambulatory Visit: Payer: Self-pay | Admitting: Family Medicine

## 2017-03-31 NOTE — Telephone Encounter (Signed)
Last seen 12/10/2016

## 2017-04-14 DIAGNOSIS — E119 Type 2 diabetes mellitus without complications: Secondary | ICD-10-CM | POA: Diagnosis not present

## 2017-04-14 DIAGNOSIS — E039 Hypothyroidism, unspecified: Secondary | ICD-10-CM | POA: Diagnosis not present

## 2017-04-14 DIAGNOSIS — D72829 Elevated white blood cell count, unspecified: Secondary | ICD-10-CM | POA: Diagnosis not present

## 2017-04-15 LAB — CBC WITH DIFFERENTIAL/PLATELET
Basophils Absolute: 0.1 10*3/uL (ref 0.0–0.2)
Basos: 1 %
EOS (ABSOLUTE): 0.3 10*3/uL (ref 0.0–0.4)
Eos: 3 %
Hematocrit: 40 % (ref 34.0–46.6)
Hemoglobin: 12.6 g/dL (ref 11.1–15.9)
Immature Grans (Abs): 0 10*3/uL (ref 0.0–0.1)
Immature Granulocytes: 0 %
Lymphocytes Absolute: 3.2 10*3/uL — ABNORMAL HIGH (ref 0.7–3.1)
Lymphs: 32 %
MCH: 30.1 pg (ref 26.6–33.0)
MCHC: 31.5 g/dL (ref 31.5–35.7)
MCV: 96 fL (ref 79–97)
Monocytes Absolute: 0.7 10*3/uL (ref 0.1–0.9)
Monocytes: 7 %
Neutrophils Absolute: 5.7 10*3/uL (ref 1.4–7.0)
Neutrophils: 57 %
Platelets: 267 10*3/uL (ref 150–379)
RBC: 4.18 x10E6/uL (ref 3.77–5.28)
RDW: 13.4 % (ref 12.3–15.4)
WBC: 9.9 10*3/uL (ref 3.4–10.8)

## 2017-04-15 LAB — MICROALBUMIN / CREATININE URINE RATIO

## 2017-04-15 LAB — HEMOGLOBIN A1C
Est. average glucose Bld gHb Est-mCnc: 154 mg/dL
Hgb A1c MFr Bld: 7 % — ABNORMAL HIGH (ref 4.8–5.6)

## 2017-04-15 LAB — TSH: TSH: 4.61 u[IU]/mL — ABNORMAL HIGH (ref 0.450–4.500)

## 2017-04-20 ENCOUNTER — Ambulatory Visit (INDEPENDENT_AMBULATORY_CARE_PROVIDER_SITE_OTHER): Payer: Medicare Other | Admitting: Family Medicine

## 2017-04-20 ENCOUNTER — Encounter: Payer: Self-pay | Admitting: Family Medicine

## 2017-04-20 ENCOUNTER — Other Ambulatory Visit: Payer: Self-pay | Admitting: Family Medicine

## 2017-04-20 VITALS — BP 118/86 | Ht 68.0 in | Wt 239.0 lb

## 2017-04-20 DIAGNOSIS — Z79891 Long term (current) use of opiate analgesic: Secondary | ICD-10-CM

## 2017-04-20 DIAGNOSIS — E119 Type 2 diabetes mellitus without complications: Secondary | ICD-10-CM | POA: Diagnosis not present

## 2017-04-20 DIAGNOSIS — I10 Essential (primary) hypertension: Secondary | ICD-10-CM | POA: Diagnosis not present

## 2017-04-20 DIAGNOSIS — I872 Venous insufficiency (chronic) (peripheral): Secondary | ICD-10-CM | POA: Diagnosis not present

## 2017-04-20 DIAGNOSIS — G4700 Insomnia, unspecified: Secondary | ICD-10-CM

## 2017-04-20 DIAGNOSIS — Z23 Encounter for immunization: Secondary | ICD-10-CM | POA: Diagnosis not present

## 2017-04-20 DIAGNOSIS — Z780 Asymptomatic menopausal state: Secondary | ICD-10-CM | POA: Diagnosis not present

## 2017-04-20 DIAGNOSIS — M8580 Other specified disorders of bone density and structure, unspecified site: Secondary | ICD-10-CM

## 2017-04-20 DIAGNOSIS — E7849 Other hyperlipidemia: Secondary | ICD-10-CM

## 2017-04-20 DIAGNOSIS — E784 Other hyperlipidemia: Secondary | ICD-10-CM

## 2017-04-20 DIAGNOSIS — Z1231 Encounter for screening mammogram for malignant neoplasm of breast: Secondary | ICD-10-CM | POA: Diagnosis not present

## 2017-04-20 MED ORDER — HYDROCODONE-ACETAMINOPHEN 5-325 MG PO TABS: 1.0000 | ORAL_TABLET | Freq: Two times a day (BID) | ORAL | 0 refills | Status: DC | PRN

## 2017-04-20 MED ORDER — QUINAPRIL HCL 20 MG PO TABS: ORAL_TABLET | ORAL | 3 refills | Status: DC

## 2017-04-20 MED ORDER — FUROSEMIDE 20 MG PO TABS: ORAL_TABLET | ORAL | 3 refills | Status: DC

## 2017-04-20 MED ORDER — AMITRIPTYLINE HCL 50 MG PO TABS: 50.0000 mg | ORAL_TABLET | Freq: Every day | ORAL | 3 refills | Status: DC

## 2017-04-20 MED ORDER — POTASSIUM CHLORIDE CRYS ER 20 MEQ PO TBCR: 20.0000 meq | EXTENDED_RELEASE_TABLET | Freq: Every day | ORAL | 2 refills | Status: DC

## 2017-04-20 MED ORDER — ACARBOSE 100 MG PO TABS: 100.0000 mg | ORAL_TABLET | Freq: Three times a day (TID) | ORAL | 6 refills | Status: DC

## 2017-04-20 MED ORDER — ZOSTER VAC RECOMB ADJUVANTED 50 MCG/0.5ML IM SUSR: 0.5000 mL | Freq: Once | INTRAMUSCULAR | 1 refills | Status: AC

## 2017-04-20 NOTE — Progress Notes (Signed)
Subjective:    Patient ID: Deanna Salinas, female    DOB: 26-Jul-1936, 81 y.o.   MRN: [id number]  Diabetes  She has type 2 diabetes mellitus. Pertinent negatives for hypoglycemia include no headaches. Pertinent negatives for diabetes include no chest pain, no fatigue, no polyphagia and no weakness.  on Precose 100 mg one three times daily with meals. Recent A1c was 7.0 on 04/14/2017.Has appt with eye dr next week. Does not see podiatrist.Eats healthy and exercises.   Also her for pain management. This patient was seen today for chronic pain  The medication list was reviewed and updated.   -Compliance with medication: takes as needed  - Number patient states they take daily: one  -when was the last dose patient took? Last night  The patient was advised the importance of maintaining medication and not using illegal substances with these.  Refills needed: None  The patient was educated that we can provide 3 monthly scripts for their medication, it is their responsibility to follow the instructions.  Side effects or complications from medications: None  Patient is aware that pain medications are meant to minimize the severity of the pain to allow their pain levels to improve to allow for better function. They are aware of that pain medications cannot totally remove their pain.  Due for UDT ( at least once per year) : Due today.       Review of Systems  Constitutional: Negative for activity change, appetite change and fatigue.  HENT: Negative for congestion and rhinorrhea.   Eyes: Negative for discharge.  Respiratory: Negative for cough, chest tightness and wheezing.   Cardiovascular: Negative for chest pain.  Gastrointestinal: Negative for abdominal pain, blood in stool and vomiting.  Endocrine: Negative for polyphagia.  Genitourinary: Negative for difficulty urinating and frequency.  Musculoskeletal: Negative for neck pain.  Skin: Negative for color change.    Allergic/Immunologic: Negative for environmental allergies and food allergies.  Neurological: Negative for weakness and headaches.  Psychiatric/Behavioral: Negative for agitation and behavioral problems.       Objective:   Physical Exam  Constitutional: She is oriented to person, place, and time. She appears well-developed and well-nourished.  HENT:  Head: Normocephalic and atraumatic.  Right Ear: External ear normal.  Left Ear: External ear normal.  Eyes: Right eye exhibits no discharge. Left eye exhibits no discharge.  Neck: Normal range of motion. No tracheal deviation present.  Cardiovascular: Normal rate, regular rhythm, normal heart sounds and intact distal pulses.  Exam reveals no gallop.   No murmur heard. Pulmonary/Chest: Effort normal and breath sounds normal. No stridor. No respiratory distress. She has no wheezes. She has no rales.  Abdominal: Soft. Bowel sounds are normal. She exhibits no distension and no mass. There is no tenderness. There is no rebound and no guarding.  Musculoskeletal: Normal range of motion. She exhibits no edema or tenderness.  Lymphadenopathy:    She has no cervical adenopathy.  Neurological: She is alert and oriented to person, place, and time. She exhibits normal muscle tone.  Skin: Skin is warm and dry.  Psychiatric: She has a normal mood and affect. Her behavior is normal.          Assessment & Plan:  The patient was seen today as part of a comprehensive visit regarding pain control. Patient's compliance with the medication as well as discussion regarding effectiveness was completed. Prescriptions were written. Patient was advised to follow-up in 3 months. The patient was assessed for any signs  of severe side effects. The patient was advised to take the medicine as directed and to report to [place] if any side effect issues.  Blood pressure good control continue current measures  Diabetes fair control watch diet continue current  medication  Insomnia takes medication helps some-patient was informed nothing's perfect does not appear to be causing excessive drowsiness or falls  Hyperlipidemia previous labs reviewed continue diet medication  Venous insufficiency in her feet mainly in the toes wear socks to keep warm  Screening mammography recommended  Bone density because of age recommended  25 minutes was spent with the patient. Greater than half the time was spent in discussion and answering questions and counseling regarding the issues that the patient came in for today.  Follow-up in 4 months

## 2017-04-28 ENCOUNTER — Other Ambulatory Visit: Payer: Self-pay | Admitting: Family Medicine

## 2017-04-28 LAB — TOXASSURE SELECT 13 (MW), URINE

## 2017-04-28 NOTE — Telephone Encounter (Signed)
Last seen 04/20/17

## 2017-04-28 NOTE — Telephone Encounter (Signed)
This with one refill

## 2017-05-04 ENCOUNTER — Other Ambulatory Visit (HOSPITAL_COMMUNITY): Payer: Medicare Other

## 2017-05-04 ENCOUNTER — Ambulatory Visit (HOSPITAL_COMMUNITY): Payer: Medicare Other

## 2017-05-21 ENCOUNTER — Other Ambulatory Visit: Payer: Self-pay | Admitting: Family Medicine

## 2017-05-23 NOTE — Telephone Encounter (Signed)
Please inform the patient I believe it is in her best interest to use over-the-counter Aleve, may take 2 tablets twice daily to help with discomfort she may continue to use hydrocodone. I am concerned that diclofenac as great of risk of ulcers as well as kidney dysfunction-given her age I think it is best for her to stay away from this medicine I would recommend discontinuing diclofenac

## 2017-05-23 NOTE — Telephone Encounter (Signed)
Last seen 03/20/17

## 2017-05-24 NOTE — Telephone Encounter (Signed)
Spoke with patient and informed her per Dr.Scott Luking- It is in your best interest to use over the counter aleve. You may take 2 tablets twice daily to help with discomfort you may use hydrocodone. Dr.Scott is concerned that diclofenac is a great risk for ulcers as well as kidney disfunction due to your age. Patient verbalized understanding.

## 2017-05-24 NOTE — Telephone Encounter (Signed)
I called and left a vm, asked that she r/c.

## 2017-06-08 ENCOUNTER — Encounter: Payer: Self-pay | Admitting: Family Medicine

## 2017-06-08 ENCOUNTER — Ambulatory Visit (INDEPENDENT_AMBULATORY_CARE_PROVIDER_SITE_OTHER): Payer: Medicare Other | Admitting: Family Medicine

## 2017-06-08 VITALS — BP 128/86 | Ht 68.0 in | Wt 235.8 lb

## 2017-06-08 DIAGNOSIS — I10 Essential (primary) hypertension: Secondary | ICD-10-CM

## 2017-06-08 NOTE — Progress Notes (Signed)
   Subjective:    Patient ID: Deanna Salinas, female    DOB: [birth year], 81 y.o.   MRN: [id number]  HPI  Patient arrives with c/o pain all over her body-sometimes she can barely move.  Review of Systems     Objective:   Physical Exam        Assessment & Plan:  Patient left without being seen

## 2017-06-10 ENCOUNTER — Telehealth: Payer: Self-pay | Admitting: Family Medicine

## 2017-06-10 ENCOUNTER — Other Ambulatory Visit: Payer: Self-pay | Admitting: Nurse Practitioner

## 2017-06-10 MED ORDER — HYDROCODONE-ACETAMINOPHEN 5-325 MG PO TABS: 1.0000 | ORAL_TABLET | Freq: Two times a day (BID) | ORAL | 0 refills | Status: DC | PRN

## 2017-06-10 NOTE — Progress Notes (Signed)
NCCSR reviewed. 

## 2017-06-10 NOTE — Telephone Encounter (Signed)
Patient requesting refill on hydrocodone 5/325

## 2017-06-10 NOTE — Telephone Encounter (Signed)
Pt notified on voicemail that script is ready for pickup.

## 2017-06-10 NOTE — Telephone Encounter (Signed)
Done. Follow up with Dr. Nicki Reaper as planned for long term pain management.

## 2017-06-16 ENCOUNTER — Ambulatory Visit: Payer: Medicare Other | Admitting: Family Medicine

## 2017-07-14 ENCOUNTER — Other Ambulatory Visit: Payer: Self-pay

## 2017-07-19 ENCOUNTER — Telehealth: Payer: Self-pay | Admitting: Family Medicine

## 2017-07-19 NOTE — Telephone Encounter (Signed)
TSH, lipid, metabolic 7, M7B, liver- diabetes hypertension hyperlipidemia and hypothyroidism Also she may have a prescription for her pain medicine #60 one twice daily as needed she may have this filled 07/19/2017

## 2017-07-19 NOTE — Telephone Encounter (Signed)
Requesting Rx for hydrocodone.  She has an appointment on 08/08/17 with Dr. Nicki Reaper.  She wants to know if she is due for labs.

## 2017-07-20 ENCOUNTER — Other Ambulatory Visit: Payer: Self-pay | Admitting: *Deleted

## 2017-07-20 DIAGNOSIS — E039 Hypothyroidism, unspecified: Secondary | ICD-10-CM

## 2017-07-20 DIAGNOSIS — E785 Hyperlipidemia, unspecified: Secondary | ICD-10-CM

## 2017-07-20 DIAGNOSIS — Z79899 Other long term (current) drug therapy: Secondary | ICD-10-CM

## 2017-07-20 DIAGNOSIS — E119 Type 2 diabetes mellitus without complications: Secondary | ICD-10-CM

## 2017-07-20 MED ORDER — HYDROCODONE-ACETAMINOPHEN 5-325 MG PO TABS: 1.0000 | ORAL_TABLET | Freq: Two times a day (BID) | ORAL | 0 refills | Status: DC | PRN

## 2017-07-20 NOTE — Telephone Encounter (Signed)
Pt.notified

## 2017-07-20 NOTE — Telephone Encounter (Signed)
Left message to return call. Labs put in and rx ready for pickup.

## 2017-08-08 ENCOUNTER — Ambulatory Visit: Payer: Medicare Other | Admitting: Family Medicine

## 2017-08-13 DIAGNOSIS — E039 Hypothyroidism, unspecified: Secondary | ICD-10-CM | POA: Diagnosis not present

## 2017-08-13 DIAGNOSIS — E785 Hyperlipidemia, unspecified: Secondary | ICD-10-CM | POA: Diagnosis not present

## 2017-08-13 DIAGNOSIS — E119 Type 2 diabetes mellitus without complications: Secondary | ICD-10-CM | POA: Diagnosis not present

## 2017-08-13 DIAGNOSIS — Z79899 Other long term (current) drug therapy: Secondary | ICD-10-CM | POA: Diagnosis not present

## 2017-08-14 LAB — BASIC METABOLIC PANEL
BUN/Creatinine Ratio: 20 (ref 12–28)
BUN: 21 mg/dL (ref 8–27)
CO2: 26 mmol/L (ref 20–29)
Calcium: 9.7 mg/dL (ref 8.7–10.3)
Chloride: 100 mmol/L (ref 96–106)
Creatinine, Ser: 1.03 mg/dL — ABNORMAL HIGH (ref 0.57–1.00)
GFR calc Af Amer: 60 mL/min/{1.73_m2} (ref >59)
GFR calc non Af Amer: 52 mL/min/{1.73_m2} — ABNORMAL LOW (ref >59)
Glucose: 138 mg/dL — ABNORMAL HIGH (ref 65–99)
Potassium: 4.7 mmol/L (ref 3.5–5.2)
Sodium: 142 mmol/L (ref 134–144)

## 2017-08-14 LAB — HEPATIC FUNCTION PANEL
ALT: 14 IU/L (ref 0–32)
AST: 15 IU/L (ref 0–40)
Albumin: 4.2 g/dL (ref 3.5–4.8)
Alkaline Phosphatase: 95 IU/L (ref 39–117)
Bilirubin Total: 0.3 mg/dL (ref 0.0–1.2)
Bilirubin, Direct: 0.1 mg/dL (ref 0.00–0.40)
Total Protein: 7 g/dL (ref 6.0–8.5)

## 2017-08-14 LAB — HEMOGLOBIN A1C
Est. average glucose Bld gHb Est-mCnc: 151 mg/dL
Hgb A1c MFr Bld: 6.9 % — ABNORMAL HIGH (ref 4.8–5.6)

## 2017-08-14 LAB — LIPID PANEL
Chol/HDL Ratio: 3.7 ratio (ref 0.0–4.4)
Cholesterol, Total: 203 mg/dL — ABNORMAL HIGH (ref 100–199)
HDL: 55 mg/dL (ref >39)
LDL Calculated: 126 mg/dL — ABNORMAL HIGH (ref 0–99)
Triglycerides: 109 mg/dL (ref 0–149)
VLDL Cholesterol Cal: 22 mg/dL (ref 5–40)

## 2017-08-14 LAB — TSH: TSH: 8.42 u[IU]/mL — ABNORMAL HIGH (ref 0.450–4.500)

## 2017-08-19 ENCOUNTER — Encounter: Payer: Self-pay | Admitting: Family Medicine

## 2017-08-19 ENCOUNTER — Ambulatory Visit (INDEPENDENT_AMBULATORY_CARE_PROVIDER_SITE_OTHER): Payer: Medicare Other | Admitting: Family Medicine

## 2017-08-19 VITALS — BP 138/78 | Ht 68.0 in | Wt 235.0 lb

## 2017-08-19 DIAGNOSIS — I10 Essential (primary) hypertension: Secondary | ICD-10-CM | POA: Diagnosis not present

## 2017-08-19 DIAGNOSIS — E119 Type 2 diabetes mellitus without complications: Secondary | ICD-10-CM | POA: Diagnosis not present

## 2017-08-19 DIAGNOSIS — E039 Hypothyroidism, unspecified: Secondary | ICD-10-CM | POA: Diagnosis not present

## 2017-08-19 DIAGNOSIS — M545 Low back pain: Secondary | ICD-10-CM

## 2017-08-19 DIAGNOSIS — Z780 Asymptomatic menopausal state: Secondary | ICD-10-CM

## 2017-08-19 DIAGNOSIS — E7849 Other hyperlipidemia: Secondary | ICD-10-CM | POA: Diagnosis not present

## 2017-08-19 MED ORDER — HYDROCODONE-ACETAMINOPHEN 5-325 MG PO TABS: 1.0000 | ORAL_TABLET | Freq: Two times a day (BID) | ORAL | 0 refills | Status: DC | PRN

## 2017-08-19 MED ORDER — LEVOTHYROXINE SODIUM 25 MCG PO TABS: 25.0000 ug | ORAL_TABLET | Freq: Every day | ORAL | 5 refills | Status: DC

## 2017-08-19 NOTE — Progress Notes (Signed)
Subjective:    Patient ID: Deanna Salinas, female    DOB: [birth year], 82 y.o.   MRN: [id number]  HPI  This patient was seen today for chronic pain  The medication list was reviewed and updated.   -Compliance with medication:  Yes   - Number patient states they take daily: 1 QHS occasionally takes a 2nd one  -when was the last dose patient took? Last night takes at 7 pm QHS  The patient was advised the importance of maintaining medication and not using illegal substances with these.  Refills needed: Yes  The patient was educated that we can provide 3 monthly scripts for their medication, it is their responsibility to follow the instructions.  Side effects or complications from medications: None  Patient is aware that pain medications are meant to minimize the severity of the pain to allow their pain levels to improve to allow for better function. They are aware of that pain medications cannot totally remove their pain.  Due for UDT ( at least once per year) : 04/2018  Patient does have insomnia takes medication denies side effects with this does help her rest  She had recent lab work which showed elevated TSH she also suffers with some mild fatigue certainly hypothyroidism could be going on  She does have blood pressure issues she takes her medication she watches the diet to some degree  She has hyperlipidemia she does not tolerate statins but she is open to other treatments    Review of Systems  Constitutional: Negative for activity change, appetite change and fatigue.  HENT: Negative for congestion.   Respiratory: Negative for cough.   Cardiovascular: Negative for chest pain.  Gastrointestinal: Negative for abdominal pain.  Endocrine: Negative for polydipsia and polyphagia.  Skin: Negative for color change.  Neurological: Negative for weakness.  Psychiatric/Behavioral: Negative for confusion.       Objective:   Physical Exam  Constitutional: She appears  well-developed and well-nourished. No distress.  HENT:  Head: Normocephalic and atraumatic.  Eyes: Right eye exhibits no discharge. Left eye exhibits no discharge.  Neck: No tracheal deviation present.  Cardiovascular: Normal rate, regular rhythm and normal heart sounds.  No murmur heard. Pulmonary/Chest: Effort normal and breath sounds normal. No respiratory distress. She has no wheezes. She has no rales.  Musculoskeletal: She exhibits no edema.  Lymphadenopathy:    She has no cervical adenopathy.  Neurological: She is alert. She exhibits normal muscle tone.  Skin: Skin is warm and dry. No erythema.  Psychiatric: Her behavior is normal.  Vitals reviewed.   25 minutes was spent with the patient. Greater than half the time was spent in discussion and answering questions and counseling regarding the issues that the patient came in for today.       Assessment & Plan:  The patient was seen today as part of a comprehensive visit regarding pain control. Patient's compliance with the medication as well as discussion regarding effectiveness was completed. Prescriptions were written. Patient was advised to follow-up in 3 months. The patient was assessed for any signs of severe side effects. The patient was advised to take the medicine as directed and to report to [place] if any side effect issues. The drug registry was checked  Patient has multiple other comorbid conditions Insomnia patient cannot sleep without her medication she only gets about 5 or 6 hours of sleep she states it does not cause any ataxia at night  Patient also has significant stiffness of the  lower back along with a tendency to walk slightly hunched over but this puts her at risk of accidental falls I recommend physical therapy  The patient inquired about tai chi as well as acupuncture she states her children mention this to her I told her that range of motion exercises and tai chi can help with balance but unfortunately there are  no places around here that offer that also I am not opposed to referring her for acupuncture but there is no place around here but the patient states she does not really want to do either but she is open to physical therapy  Patient is due for bone density therefore this will be ordered  Patient has progressive development of elevated TSH in my opinion she is suffering with clinical hypothyroidism therefore start levothyroxine recheck TSH in 8 weeks  Patient has hyperlipidemia she would benefit from Zetia she does not tolerate statins  Patient also has diabetes under fair control watch diet try to stay active try to lose weight

## 2017-08-20 DIAGNOSIS — E039 Hypothyroidism, unspecified: Secondary | ICD-10-CM | POA: Insufficient documentation

## 2017-08-20 DIAGNOSIS — E038 Other specified hypothyroidism: Secondary | ICD-10-CM | POA: Insufficient documentation

## 2017-08-24 ENCOUNTER — Encounter: Payer: Self-pay | Admitting: Family Medicine

## 2017-08-24 MED ORDER — EZETIMIBE 10 MG PO TABS: 10.0000 mg | ORAL_TABLET | Freq: Every day | ORAL | 2 refills | Status: DC

## 2017-08-24 NOTE — Progress Notes (Signed)
Patient advised per Dr Scott-Dr Nicki Reaper discussed with patient on her visit elevated cholesterol she does not tolerate statins He also discussed with her hypothyroidism we started levothyroxine this patient needs to repeat a TSH in 8-12 weeks. Please order this. Also please send in Zetia 10 mg 1 daily #30, 5 refills when you talk with her about her lab work remind her that Dr Nicki Reaper wanted to start Zetia it is not a statin she should tolerate it well with it would help bring her cholesterol down some. Patient verbalized understanding. Blood work ordered in [employer]. Prescription sent electronically to pharmacy.

## 2017-08-24 NOTE — Addendum Note (Signed)
Addended by: Dairl Ponder on: 08/24/2017 10:44 AM   Modules accepted: Orders

## 2017-08-29 ENCOUNTER — Ambulatory Visit (HOSPITAL_COMMUNITY)
Admission: RE | Admit: 2017-08-29 | Discharge: 2017-08-29 | Disposition: A | Discharge status: Home / Self Care | Payer: Medicare Other | Source: Ambulatory Visit | Attending: Family Medicine | Admitting: Family Medicine

## 2017-08-29 DIAGNOSIS — Z780 Asymptomatic menopausal state: Secondary | ICD-10-CM | POA: Diagnosis not present

## 2017-08-29 DIAGNOSIS — Z13820 Encounter for screening for osteoporosis: Secondary | ICD-10-CM | POA: Diagnosis not present

## 2017-08-30 ENCOUNTER — Encounter: Payer: Self-pay | Admitting: Family Medicine

## 2017-09-07 ENCOUNTER — Ambulatory Visit (HOSPITAL_COMMUNITY): Payer: Medicare Other | Admitting: Physical Therapy

## 2017-09-07 ENCOUNTER — Telehealth (HOSPITAL_COMMUNITY): Payer: Self-pay | Admitting: Family Medicine

## 2017-09-07 NOTE — Telephone Encounter (Signed)
09/07/17  pt called ealier today to cx and I forgot to remove the appt and I can't remember what her reason was for cx appt.

## 2017-09-13 DIAGNOSIS — E039 Hypothyroidism, unspecified: Secondary | ICD-10-CM | POA: Diagnosis not present

## 2017-09-14 ENCOUNTER — Encounter: Payer: Self-pay | Admitting: Family Medicine

## 2017-09-14 LAB — TSH: TSH: 3.04 u[IU]/mL (ref 0.450–4.500)

## 2017-09-15 ENCOUNTER — Telehealth: Payer: Self-pay | Admitting: Family Medicine

## 2017-09-15 NOTE — Telephone Encounter (Signed)
Requesting refill on hydrocodone 5-325 to pick up Monday.

## 2017-09-15 NOTE — Telephone Encounter (Signed)
Last seen 08/19/16 for pain management

## 2017-09-16 ENCOUNTER — Other Ambulatory Visit: Payer: Self-pay | Admitting: *Deleted

## 2017-09-16 ENCOUNTER — Other Ambulatory Visit: Payer: Self-pay | Admitting: Family Medicine

## 2017-09-16 MED ORDER — HYDROCODONE-ACETAMINOPHEN 5-325 MG PO TABS: 1.0000 | ORAL_TABLET | Freq: Two times a day (BID) | ORAL | 0 refills | Status: DC | PRN

## 2017-09-16 NOTE — Telephone Encounter (Signed)
We are printing 2 rx this will take care of her for next 2 months then needs ov

## 2017-09-16 NOTE — Telephone Encounter (Signed)
2 scripts printed. Await dr scott's signatures

## 2017-09-16 NOTE — Telephone Encounter (Signed)
I called and spoke with pt husband he will notify she can come pick up the rx's they are put up front for her to pick up.

## 2017-10-17 ENCOUNTER — Other Ambulatory Visit: Payer: Self-pay | Admitting: Family Medicine

## 2017-10-17 NOTE — Telephone Encounter (Signed)
May have 90 with 1 refill 

## 2017-10-28 ENCOUNTER — Other Ambulatory Visit: Payer: Self-pay | Admitting: Family Medicine

## 2017-10-28 ENCOUNTER — Telehealth: Payer: Self-pay | Admitting: Family Medicine

## 2017-10-28 ENCOUNTER — Other Ambulatory Visit: Payer: Self-pay

## 2017-10-28 DIAGNOSIS — E039 Hypothyroidism, unspecified: Secondary | ICD-10-CM

## 2017-10-28 DIAGNOSIS — E119 Type 2 diabetes mellitus without complications: Secondary | ICD-10-CM

## 2017-10-28 DIAGNOSIS — R5383 Other fatigue: Secondary | ICD-10-CM

## 2017-10-28 DIAGNOSIS — E7849 Other hyperlipidemia: Secondary | ICD-10-CM

## 2017-10-28 MED ORDER — HYDROCODONE-ACETAMINOPHEN 5-325 MG PO TABS: 1.0000 | ORAL_TABLET | Freq: Two times a day (BID) | ORAL | 0 refills | Status: DC | PRN

## 2017-10-28 NOTE — Telephone Encounter (Signed)
Pts husband returned call; awaiting signature on script

## 2017-10-28 NOTE — Telephone Encounter (Signed)
Lipid, liver, metabolic 7, TSH, hemoglobin A1c-patient may have 1 month refill on her hydrocodone.  Please be aware any medication request rested today or for Monday Tuesday or Wednesday will need to be handled by Dr. Richardson Landry because I will be out of office

## 2017-10-28 NOTE — Telephone Encounter (Signed)
Labs placed in Epic; med reordered awaiting signature. Left message for pt to return call

## 2017-10-28 NOTE — Telephone Encounter (Signed)
Recent labs: 09/13/17 TSH                       08/13/17 Lipid, Liver, TSH, Met 7 Hgba1c

## 2017-10-28 NOTE — Telephone Encounter (Signed)
Script up front.

## 2017-10-28 NOTE — Telephone Encounter (Signed)
Patient has an appointment on 11/17/17 with Dr. Nicki Reaper.  She is requesting orders for labs.  Also, requesting refill on hydrocodone 5-325 mg 2 times daily.  She will be out next week.

## 2017-11-11 ENCOUNTER — Telehealth: Payer: Self-pay | Admitting: Family Medicine

## 2017-11-11 DIAGNOSIS — E118 Type 2 diabetes mellitus with unspecified complications: Secondary | ICD-10-CM

## 2017-11-11 DIAGNOSIS — M1990 Unspecified osteoarthritis, unspecified site: Secondary | ICD-10-CM

## 2017-11-11 DIAGNOSIS — I10 Essential (primary) hypertension: Secondary | ICD-10-CM

## 2017-11-11 DIAGNOSIS — E039 Hypothyroidism, unspecified: Secondary | ICD-10-CM

## 2017-11-11 DIAGNOSIS — E785 Hyperlipidemia, unspecified: Secondary | ICD-10-CM

## 2017-11-11 DIAGNOSIS — Z79899 Other long term (current) drug therapy: Secondary | ICD-10-CM

## 2017-11-11 NOTE — Telephone Encounter (Signed)
Pt is wanting to know if labs to tests for parkinson's and arthritis can be sent over to the lab. Pt states that she has been hurting and would like to have those tested if possible. Please advise.

## 2017-11-11 NOTE — Telephone Encounter (Signed)
Lipid, liver, metabolic 7, TSH, [id number] add sed rate, rheumatoid factor-diagnosis hyperlipidemia hypertension diabetes hypothyroidism arthritis.  Please let the patient know that there is not a blood test for Parkinson's that is a clinical diagnosis she can discuss this with [place] when she comes in for her office visit if necessary we can set her up with neurology for further evaluation but that would be after her office visit

## 2017-11-14 DIAGNOSIS — E118 Type 2 diabetes mellitus with unspecified complications: Secondary | ICD-10-CM | POA: Diagnosis not present

## 2017-11-14 DIAGNOSIS — M1990 Unspecified osteoarthritis, unspecified site: Secondary | ICD-10-CM | POA: Diagnosis not present

## 2017-11-14 DIAGNOSIS — I10 Essential (primary) hypertension: Secondary | ICD-10-CM | POA: Diagnosis not present

## 2017-11-14 DIAGNOSIS — E039 Hypothyroidism, unspecified: Secondary | ICD-10-CM | POA: Diagnosis not present

## 2017-11-14 DIAGNOSIS — E785 Hyperlipidemia, unspecified: Secondary | ICD-10-CM | POA: Diagnosis not present

## 2017-11-14 DIAGNOSIS — Z79899 Other long term (current) drug therapy: Secondary | ICD-10-CM | POA: Diagnosis not present

## 2017-11-14 NOTE — Telephone Encounter (Signed)
I called left a message with husband to r/c.

## 2017-11-14 NOTE — Telephone Encounter (Signed)
Patient is aware 

## 2017-11-15 LAB — LIPID PANEL
Chol/HDL Ratio: 3 ratio (ref 0.0–4.4)
Cholesterol, Total: 155 mg/dL (ref 100–199)
HDL: 52 mg/dL (ref >39)
LDL Calculated: 77 mg/dL (ref 0–99)
Triglycerides: 129 mg/dL (ref 0–149)
VLDL Cholesterol Cal: 26 mg/dL (ref 5–40)

## 2017-11-15 LAB — HEPATIC FUNCTION PANEL
ALT: 13 IU/L (ref 0–32)
AST: 18 IU/L (ref 0–40)
Albumin: 4.3 g/dL (ref 3.5–4.7)
Alkaline Phosphatase: 82 IU/L (ref 39–117)
Bilirubin Total: 0.3 mg/dL (ref 0.0–1.2)
Bilirubin, Direct: 0.08 mg/dL (ref 0.00–0.40)
Total Protein: 6.6 g/dL (ref 6.0–8.5)

## 2017-11-15 LAB — BASIC METABOLIC PANEL
BUN/Creatinine Ratio: 16 (ref 12–28)
BUN: 16 mg/dL (ref 8–27)
CO2: 27 mmol/L (ref 20–29)
Calcium: 9.9 mg/dL (ref 8.7–10.3)
Chloride: 99 mmol/L (ref 96–106)
Creatinine, Ser: 1 mg/dL (ref 0.57–1.00)
GFR calc Af Amer: 61 mL/min/{1.73_m2} (ref >59)
GFR calc non Af Amer: 53 mL/min/{1.73_m2} — ABNORMAL LOW (ref >59)
Glucose: 133 mg/dL — ABNORMAL HIGH (ref 65–99)
Potassium: 5.8 mmol/L — ABNORMAL HIGH (ref 3.5–5.2)
Sodium: 142 mmol/L (ref 134–144)

## 2017-11-15 LAB — RHEUMATOID FACTOR: Rhuematoid fact SerPl-aCnc: <10 IU/mL (ref 0.0–13.9)

## 2017-11-15 LAB — HEMOGLOBIN A1C
Est. average glucose Bld gHb Est-mCnc: 148 mg/dL
Hgb A1c MFr Bld: 6.8 % — ABNORMAL HIGH (ref 4.8–5.6)

## 2017-11-15 LAB — TSH: TSH: 4.88 u[IU]/mL — ABNORMAL HIGH (ref 0.450–4.500)

## 2017-11-15 LAB — SEDIMENTATION RATE: Sed Rate: 5 mm/hr (ref 0–40)

## 2017-11-17 ENCOUNTER — Ambulatory Visit (INDEPENDENT_AMBULATORY_CARE_PROVIDER_SITE_OTHER): Payer: Medicare Other | Admitting: Family Medicine

## 2017-11-17 ENCOUNTER — Encounter: Payer: Self-pay | Admitting: Family Medicine

## 2017-11-17 VITALS — BP 134/86 | Ht 68.0 in | Wt 229.0 lb

## 2017-11-17 DIAGNOSIS — E119 Type 2 diabetes mellitus without complications: Secondary | ICD-10-CM

## 2017-11-17 DIAGNOSIS — E039 Hypothyroidism, unspecified: Secondary | ICD-10-CM | POA: Diagnosis not present

## 2017-11-17 DIAGNOSIS — E7849 Other hyperlipidemia: Secondary | ICD-10-CM | POA: Diagnosis not present

## 2017-11-17 DIAGNOSIS — I10 Essential (primary) hypertension: Secondary | ICD-10-CM | POA: Diagnosis not present

## 2017-11-17 DIAGNOSIS — G4700 Insomnia, unspecified: Secondary | ICD-10-CM | POA: Diagnosis not present

## 2017-11-17 DIAGNOSIS — M150 Primary generalized (osteo)arthritis: Secondary | ICD-10-CM

## 2017-11-17 DIAGNOSIS — R5383 Other fatigue: Secondary | ICD-10-CM

## 2017-11-17 DIAGNOSIS — M159 Polyosteoarthritis, unspecified: Secondary | ICD-10-CM

## 2017-11-17 LAB — POCT HEMOGLOBIN: Hemoglobin: 12.1 g/dL — AB (ref 12.2–16.2)

## 2017-11-17 MED ORDER — HYDROCODONE-ACETAMINOPHEN 5-325 MG PO TABS: ORAL_TABLET | ORAL | 0 refills | Status: DC

## 2017-11-17 MED ORDER — LEVOTHYROXINE SODIUM 25 MCG PO TABS: ORAL_TABLET | ORAL | 5 refills | Status: DC

## 2017-11-17 NOTE — Progress Notes (Signed)
Subjective:    Patient ID: Deanna Salinas, female    DOB: [birth year], 82 y.o.   MRN: [id number]  HPI  Patient is here today to follow up on her chronic health issues. Last A1c was done on 11/14/2017 at 6.8, She takes Precose 100 mg she has not been taking it correctly she was taking three times daily,but was not taking with her meals and she also has Htn and is supposed to be  taking Lasix 20 mg and Quinapril 20 mg.States Sunday she was stiff and hurt so bad she thought she was going to die she was going to die.she states she has stopped most of her medications on Monday.Fatigued a lot lately.  This patient relates that she is just felt bad over the past several weeks a lot of stiffness pain discomfort in addition to this she also relates that she stopped taking a lot of her medicine earlier this week because she wondered if they were making her sick  She relates a lot of dry mouth and gas issues she also relates severe joint pains and discomfort along with soreness that has been going on for several months and she feels is getting worse she takes hydrocodone a couple times per day it seems to help her she has a difficult time resting because of her pain she finds herself very frustrated with how things are going and wonders if there is something else going on  Patient does have blood pressure issues she takes her medicine on a regular basis denies compliance issues with this tries to watch her diet  Patient has morbid obesity she knows the importance of watching her diet cutting portions and try to bring her weight down but is very difficult for her to do so because she cannot exercise much because of her knees and arthritic troubles  She has chronic pain and discomfort she uses pain medicine a couple times a day to help her with her joint pain and discomfort Tylenol alone does not do enough the pain medicine does help her function better  She does take thyroid medicine on a regular basis but  recently stopped it did not see where it was helping her much so therefore she did not think she needed it  She also has diabetes her sugars she does not know how they are because she does not check her sugars she has been trying to watch her diet she is taking her medicine but she stopped taking that about a week ago Results for orders placed or performed in visit on 11/17/17  POCT hemoglobin  Result Value Ref Range   Hemoglobin 12.1 (A) 12.2 - 16.2 g/dL    Review of Systems  Constitutional: Negative for activity change, appetite change and fatigue.  HENT: Negative for congestion.   Respiratory: Negative for cough.   Cardiovascular: Negative for chest pain.  Gastrointestinal: Negative for abdominal pain.  Endocrine: Negative for polydipsia and polyphagia.  Musculoskeletal: Positive for arthralgias, back pain and gait problem.  Skin: Negative for color change.  Neurological: Negative for weakness.  Psychiatric/Behavioral: Negative for confusion.       Objective:   Physical Exam  Constitutional: She appears well-developed and well-nourished. No distress.  HENT:  Head: Normocephalic and atraumatic.  Eyes: Right eye exhibits no discharge. Left eye exhibits no discharge.  Neck: No tracheal deviation present.  Cardiovascular: Normal rate, regular rhythm and normal heart sounds.  No murmur heard. Pulmonary/Chest: Effort normal and breath sounds normal. No respiratory distress.  She has no wheezes. She has no rales.  Musculoskeletal: She exhibits no edema.  Lymphadenopathy:    She has no cervical adenopathy.  Neurological: She is alert. She exhibits normal muscle tone.  Skin: Skin is warm and dry. No erythema.  Psychiatric: Her behavior is normal.  Vitals reviewed. Rheumatoid factor negative Sed rate normal A1c within reasonable limits 6.8 Liver normal TSH slightly elevated Kidney function looks good Potassium slightly elevated Cholesterol profile good All of these reviewed  with the patient  Severe osteoarthritis of both knees some pedal edema but not severe 30 minutes spent with patient reviewing overall her multiple issues as well as medications and discussing these one by one greater than half the time spent in consultation and coordination of her health issues     Assessment & Plan:  Significant fatigue I believe this is related mainly to her age watch diet try to stay active  Blood pressure good control currently continue current measures stop potassium may continue quinapril  Diabetes fair control she will check sugars twice daily over the next 14 days and send those readings to [place] then we can judge what to start her on stop Precose because of gas as a side effect  Hypothyroidism increase the medication to 2 on Mondays 1 on all other days monitor TSH again in several months time  Chronic pain She does not abuse pain medicine drug registry checked she has a decent appreciation that the pain medicine is to reduce her pain but not totally take it away she may use it up to 3 times per day 3 prescriptions given  Insomnia do not take sleeping medicine with pain medicine she has been on sleeping medicine for years it helps her quality of life she understands it does slightly increase her risk of accidental falls at nighttime  Morbid obesity patient understands importance of trying to watch her diet lose weight unable to exercise much  Significant osteoarthritis of the knees and other joints patient is concerned there is something else going on I think it is reasonable to get a consultation with rheumatology but I told the patient that if rheumatology checks her over and says it is osteoarthritis more than likely they will refer her back to [place]    Follow-up in 3 months

## 2017-11-28 ENCOUNTER — Encounter: Payer: Self-pay | Admitting: Family Medicine

## 2017-12-08 ENCOUNTER — Telehealth: Payer: Self-pay | Admitting: Family Medicine

## 2017-12-08 NOTE — Telephone Encounter (Signed)
Pt called, left message on my voicemail  States that Dr. Arlean Hopping referral person recommended pt be referred to another rheumatologist & get on a list due to the time that she may have to wait for case to be reviewed & that Dr. Estanislado Pandy may decide not to take her case  Please initiate new referral in system so that I may process

## 2017-12-08 NOTE — Telephone Encounter (Signed)
I still believe it is in the patient's best interest that she be seen by rheumatology.  You can let the patient know that I did receive her letter regarding how she feels the shin Grix shot caused her problems.  She can hold off on getting the second shingles vaccine until she sees rheumatology.  Hopefully-try [facility] they tend to be more helpful

## 2017-12-08 NOTE — Telephone Encounter (Signed)
Referral to [facility] per Dr Nicki Reaper

## 2017-12-08 NOTE — Telephone Encounter (Addendum)
Discussed with patient. Patient states she does want to see Rheumatology and would like multiple referrals to several rheumatologist to see who can get her in first and that who she will go.

## 2017-12-18 ENCOUNTER — Telehealth: Payer: Self-pay | Admitting: Family Medicine

## 2017-12-18 NOTE — Telephone Encounter (Signed)
Nurses-please let the patient know I looked over her sugar readings they overall look good.  Keep everything as is.  Eat healthy stay active.  Do lab work in July with follow-up office visit later in July or August at the latest

## 2017-12-19 NOTE — Telephone Encounter (Signed)
Discussed with pt. Pt verbalized understanding.  °

## 2017-12-19 NOTE — Telephone Encounter (Signed)
Discussed with pt. Pt wants to know does she continue to check blood sugar twice a day or can she go to once daily now.

## 2017-12-19 NOTE — Telephone Encounter (Signed)
She may go to once daily testing I would recommend at this point she can check her sugars in the morning 3 days a week and check her sugars later in the evening 2 days a week and give herself a break on the other 2 days-keep all regular follow-ups

## 2017-12-22 NOTE — Progress Notes (Signed)
Office Visit Note  Patient: Deanna Salinas             Date of Birth: 17-Jul-1936           MRN: [id number]             PCP: Kathyrn Drown, MD Referring: Kathyrn Drown, MD Visit Date: 01/05/2018 Occupation: @GUAROCC @    Subjective:  Pain in multiple joints.   History of Present Illness: Deanna Salinas is a 82 y.o. female seen in consultation per request of Dr. Liborio Nixon.  According to patient she started having joint pain about 2 years ago.  She states she had a lot of knee joint discomfort for which she has been seeing Dr. Mardelle Matte.  He has given her cortisone injections and also Visco supplement injections to her knee joints which is been helpful.  Her last injection to her left knee joint were about 1 year ago.  He has also discussed left total knee replacement when she is ready.  She states she has pain in multiple other joints including her bilateral elbows, bilateral hands, bilateral feet.  She notices some swelling in her hands and her knee joints.  She states that ice helps her.  She also feels that her muscles have been weak for the last 1 year.  She experiences nocturnal pain.  She states she quit doing water aerobics about 1-1/2 years ago due to generalized pain.  She has been getting prescription hydrocodone from her PCP which she takes only on as needed basis.  Recently she has been taking it every night.  Patient reports lower back pain intermittently.  She has difficulty walking straight.  Activities of Daily Living:  Patient reports morning stiffness for 2 hours.   Patient Reports nocturnal pain.  Difficulty dressing/grooming: Denies Difficulty climbing stairs: Reports Difficulty getting out of chair: Reports Difficulty using hands for taps, buttons, cutlery, and/or writing: Reports   Review of Systems  Constitutional: Positive for fatigue. Negative for fever, night sweats, weight gain and weight loss.  HENT: Negative for ear pain, mouth sores, trouble swallowing, trouble  swallowing, mouth dryness and nose dryness.   Eyes: Negative for pain, redness, visual disturbance and dryness.  Respiratory: Negative for cough, shortness of breath and difficulty breathing.   Cardiovascular: Negative for chest pain, palpitations, hypertension, irregular heartbeat and swelling in legs/feet.  Gastrointestinal: Negative for blood in stool, constipation and diarrhea.  Endocrine: Negative for increased urination.  Genitourinary: Negative for difficulty urinating and vaginal dryness.  Musculoskeletal: Positive for arthralgias, joint pain, joint swelling, myalgias, muscle weakness, morning stiffness and myalgias. Negative for muscle tenderness.  Skin: Positive for hair loss. Negative for color change, rash, skin tightness, ulcers and sensitivity to sunlight.  Allergic/Immunologic: Negative for susceptible to infections.  Neurological: Positive for weakness. Negative for dizziness, numbness, memory loss and night sweats.  Hematological: Negative for bruising/bleeding tendency and swollen glands.  Psychiatric/Behavioral: Positive for sleep disturbance. Negative for depressed mood. The patient is not nervous/anxious.     PMFS History:  Patient Active Problem List   Diagnosis Date Noted  . Hypothyroidism 08/20/2017  . Morbid obesity ([place]) 12/11/2016  . Leukocytosis 06/18/2015  . Type 2 diabetes mellitus ([place]) 06/16/2015  . Abdominal pain, epigastric 02/18/2014  . Elevated lipase 02/18/2014  . Pancreatitis, acute 12/11/2013  . POSTMENOPAUSAL OSTEOPOROSIS 02/17/2009  . Osteoarthritis 03/29/2008  . Insomnia 09/14/2007  . Hyperlipidemia 08/26/2006  . DEPRESSION 08/26/2006  . MIGRAINE HEADACHE 08/26/2006  . CATARACT NOS 08/26/2006  .  Essential hypertension 08/26/2006  . SCIATICA 08/26/2006    Past Medical History:  Diagnosis Date  . Essential hypertension   . Fatty liver   . Hyperlipidemia   . Type 2 diabetes mellitus ([facility])     Family History  Problem Relation Age of  Onset  . Hypertension Father   . Hypertension Other   . Hypertension Other   . Colon cancer Neg Hx    Past Surgical History:  Procedure Laterality Date  . APPENDECTOMY     At the time of hysterectomy  . CESAREAN SECTION     X5  . COLONOSCOPY    . COLONOSCOPY N/A 06/11/2013   Colonic diverticulosis, hyperplastic polyps. no further screening colonoscopies recommended  . ESOPHAGOGASTRODUODENOSCOPY N/A 02/20/2014   Procedure: ESOPHAGOGASTRODUODENOSCOPY (EGD);  Surgeon: Daneil Dolin, MD;  Location: [facility];  Service: Endoscopy;  Laterality: N/A;  11:00   Social History   Social History Narrative  . Not on file     Objective: Vital Signs: BP 136/82 (BP Location: Left Arm, Patient Position: Sitting, Cuff Size: Normal)   Pulse 92   Ht 5\' 7"  (1.702 m)   Wt 224 lb (101.6 kg)   BMI 35.08 kg/m    Physical Exam  Constitutional: She is oriented to person, place, and time. She appears well-developed and well-nourished.  HENT:  Head: Normocephalic and atraumatic.  Eyes: Conjunctivae and EOM are normal.  Neck: Normal range of motion.  Cardiovascular: Normal rate, regular rhythm, normal heart sounds and intact distal pulses.  Pulmonary/Chest: Effort normal and breath sounds normal.  Abdominal: Soft. Bowel sounds are normal.  Lymphadenopathy:    She has no cervical adenopathy.  Neurological: She is alert and oriented to person, place, and time.  Skin: Skin is warm and dry. Capillary refill takes less than 2 seconds.  Psychiatric: She has a normal mood and affect. Her behavior is normal.  Nursing note and vitals reviewed.    Musculoskeletal Exam: C-spine good range of motion.  Thoracic spine some kyphosis noted.  She has some discomfort range of motion of her lumbar spine.  Shoulder joints and elbow joints are good range of motion.  She has bilateral lateral epicondylitis.  She had bilateral CMC thickening but no MCP PIP DIP swelling was noted.  She had good range of motion of  bilateral hip joints.  She has crepitus in her bilateral knee joints with some warmth in her knee joints without any effusion.  Ankle joints were in good range of motion.  She has some tenderness across MTPs without any synovitis.  CDAI Exam: No CDAI exam completed.    Investigation: No additional findings. Component     Latest Ref Rng & Units 11/14/2017  Sed Rate     0 - 40 mm/hr 5  RA Latex Turbid.     0.0 - 13.9 IU/mL <10.0   CBC Latest Ref Rng & Units 11/17/2017 04/14/2017 12/10/2016  WBC 3.4 - 10.8 x10E3/uL - 9.9 12.9(H)  Hemoglobin 12.2 - 16.2 g/dL 12.1(A) 12.6 13.3  Hematocrit 34.0 - 46.6 % - 40.0 41.0  Platelets 150 - 379 x10E3/uL - 267 276   CMP Latest Ref Rng & Units 11/14/2017 08/13/2017 12/08/2016  Glucose 65 - 99 mg/dL 133(H) 138(H) 148(H)  BUN 8 - 27 mg/dL 16 21 24   Creatinine 0.57 - 1.00 mg/dL 1.00 1.03(H) 1.08(H)  Sodium 134 - 144 mmol/L 142 142 139  Potassium 3.5 - 5.2 mmol/L 5.8(H) 4.7 4.9  Chloride 96 - 106 mmol/L 99  100 96  CO2 20 - 29 mmol/L 27 26 29   Calcium 8.7 - 10.3 mg/dL 9.9 9.7 10.0  Total Protein 6.0 - 8.5 g/dL 6.6 7.0 6.8  Total Bilirubin 0.0 - 1.2 mg/dL 0.3 0.3 0.3  Alkaline Phos 39 - 117 IU/L 82 95 93  AST 0 - 40 IU/L 18 15 17   ALT 0 - 32 IU/L 13 14 21     Imaging: Xr Foot 2 Views Left  Result Date: 01/05/2018 First MTP narrowing and subluxation was noted.  PIP and DIP narrowing was noted.  Intertarsal joint space narrowing was noted.  No subtalar joint space narrowing was noted.  Posterior calcaneal spur was noted. Impression: These findings are consistent with osteoarthritis of the foot.  Xr Foot 2 Views Right  Result Date: 01/05/2018 First MTP narrowing PIP and DIP narrowing was noted.  Some cystic changes were noted in the intertarsal joints.  Narrowing of intertarsal joints was noted.  Small posterior calcaneal spur was noted. Impression: These findings are consistent with osteoarthritis of the foot.  Xr Hand 2 View Left  Result Date:  01/05/2018 Severe CMC narrowing and subluxation was noted.  PIP and DIP narrowing was noted.  No intercarpal joint space narrowing was noted.  Possible erosive and cystic changes were noted in the base of metacarpals.  No intercarpal radiocarpal joint space narrowing was noted. Impression: These findings are consistent with osteoarthritis.  Changes in the base of metacarpal raises concern of possible inflammatory arthritis.  Xr Hand 2 View Right  Result Date: 01/05/2018 Right CMC severe narrowing was noted.  PIP and DIP narrowing was noted.  No significant intercarpal radiocarpal joint space narrowing was noted.  Some erosive versus cystic changes were noted in the ulnar styloid and the carpal bones. Impression: These findings are consistent with osteoarthritis of the hand.  The changes in the ulnar styloid raises concern of inflammatory arthritis.  Xr Knee 3 View Left  Result Date: 01/05/2018 Severe medial compartment narrowing with medial and lateral large osteophytes was noted.  Mild patellofemoral narrowing was noted. Impression: These findings are consistent with severe osteoarthritis and mild chondromalacia patella.  No chondrocalcinosis was noted.  Xr Knee 3 View Right  Result Date: 01/05/2018 Severe lateral compartment narrowing with lateral, medial and intercondylar osteophytes was noted.  No chondrocalcinosis was noted.  Severe patellofemoral narrowing was noted. Impression: These findings are consistent with severe osteoarthritis and severe chondromalacia patella of the knee joint.  Xr Lumbar Spine 2-3 Views  Result Date: 01/05/2018 Severe levoscoliosis of lumbar spine with multilevel spondylosis.  Severe narrowing was noted between L2-3, L3-4, L4-5 and facet joint arthropathy was noted.  impression: These findings are consistent with severe levoscoliosis degenerative disc disease and facet joint arthropathy.   Speciality Comments: No specialty comments available.    Procedures:   No procedures performed Allergies: Ambien [zolpidem tartrate]; Lipitor [atorvastatin]; Metformin and related; Pravastatin; and Trazodone and nefazodone   Assessment / Plan:     Visit Diagnoses: Primary osteoarthritis involving multiple joints - RF -, Sed rate 5.  Pain in both hands -he needs to have pain and discomfort in her bilateral hands.  Her clinical findings are consistent with osteoarthritis.  The x-rays today received revealed some cystic changes in the carpal bones and ulnar styloid.  I will obtain following labs to evaluate further plan: XR Hand 2 View Right, XR Hand 2 View Left, Cyclic citrul peptide antibody, IgG, Uric acid, Iron, TIBC and Ferritin Panel  Chronic pain of both knees -patient  has severe osteoarthritis in bilateral knee joints on the x-rays.  She may benefit from left knee total knee replacement.  Although she does quite well with the Visco supplement injections.  She has been having some gait instability which I believe is due to deconditioning.  Need for regular exercise was emphasized.  I have also given her a handout on knee exercises.  Plan: XR KNEE 3 VIEW RIGHT, XR KNEE 3 VIEW LEFT  Pain in both feet - Plan: XR Foot 2 Views Right, XR Foot 2 Views Left: the x-rays revealed osteoarthritic changes and some cystic changes in the intertarsal bones.  Proper fitting shoes were discussed.  Chronic midline low back pain without sciatica - Plan: XR Lumbar Spine 2-3 Views.  She has severe levoscoliosis and multilevel spondylosis and facet joint arthropathy.  She does have some lower back pain but no radiculopathy.  I have given her back exercise handout.  History of osteoporosis - DEXA 08/29/17: BMD as determined from [facility] Neck Left is 0.913 g/cm2 with a T-Scoreof -0.9  Essential hypertension  History of diabetes mellitus, type II  History of hypothyroidism  History of hyperlipidemia  History of migraine  Myalgia - Plan: CK    Orders: Orders Placed This Encounter   Procedures  . XR Hand 2 View Right  . XR Hand 2 View Left  . XR KNEE 3 VIEW RIGHT  . XR KNEE 3 VIEW LEFT  . XR Foot 2 Views Right  . XR Foot 2 Views Left  . XR Lumbar Spine 2-3 Views  . CK  . Cyclic citrul peptide antibody, IgG  . Uric acid  . Iron, TIBC and Ferritin Panel   No orders of the defined types were placed in this encounter.   Face-to-face time spent with patient was 50 minutes. /50% of time was spent in counseling and coordination of care.  Follow-Up Instructions: Return for Osteoarthritis.   Bo Merino, MD  Note - This record has been created using Editor, commissioning.  Chart creation errors have been sought, but may not always  have been located. Such creation errors do not reflect on  the standard of medical care.

## 2017-12-23 ENCOUNTER — Other Ambulatory Visit: Payer: Self-pay | Admitting: Family Medicine

## 2018-01-05 ENCOUNTER — Ambulatory Visit (INDEPENDENT_AMBULATORY_CARE_PROVIDER_SITE_OTHER): Payer: Medicare Other | Admitting: Rheumatology

## 2018-01-05 ENCOUNTER — Ambulatory Visit (INDEPENDENT_AMBULATORY_CARE_PROVIDER_SITE_OTHER): Payer: Medicare Other

## 2018-01-05 ENCOUNTER — Encounter: Payer: Self-pay | Admitting: Rheumatology

## 2018-01-05 ENCOUNTER — Ambulatory Visit (INDEPENDENT_AMBULATORY_CARE_PROVIDER_SITE_OTHER): Payer: Self-pay

## 2018-01-05 VITALS — BP 136/82 | HR 92 | Ht 67.0 in | Wt 224.0 lb

## 2018-01-05 DIAGNOSIS — M545 Low back pain, unspecified: Secondary | ICD-10-CM

## 2018-01-05 DIAGNOSIS — M79642 Pain in left hand: Secondary | ICD-10-CM

## 2018-01-05 DIAGNOSIS — M79641 Pain in right hand: Secondary | ICD-10-CM

## 2018-01-05 DIAGNOSIS — G8929 Other chronic pain: Secondary | ICD-10-CM

## 2018-01-05 DIAGNOSIS — Z8639 Personal history of other endocrine, nutritional and metabolic disease: Secondary | ICD-10-CM

## 2018-01-05 DIAGNOSIS — M150 Primary generalized (osteo)arthritis: Secondary | ICD-10-CM

## 2018-01-05 DIAGNOSIS — Z8739 Personal history of other diseases of the musculoskeletal system and connective tissue: Secondary | ICD-10-CM

## 2018-01-05 DIAGNOSIS — I10 Essential (primary) hypertension: Secondary | ICD-10-CM

## 2018-01-05 DIAGNOSIS — M79672 Pain in left foot: Secondary | ICD-10-CM

## 2018-01-05 DIAGNOSIS — M25562 Pain in left knee: Secondary | ICD-10-CM

## 2018-01-05 DIAGNOSIS — Z8669 Personal history of other diseases of the nervous system and sense organs: Secondary | ICD-10-CM

## 2018-01-05 DIAGNOSIS — M79671 Pain in right foot: Secondary | ICD-10-CM

## 2018-01-05 DIAGNOSIS — M25561 Pain in right knee: Secondary | ICD-10-CM

## 2018-01-05 DIAGNOSIS — M159 Polyosteoarthritis, unspecified: Secondary | ICD-10-CM

## 2018-01-05 DIAGNOSIS — M791 Myalgia, unspecified site: Secondary | ICD-10-CM | POA: Diagnosis not present

## 2018-01-05 NOTE — Patient Instructions (Signed)
Knee Exercises Ask your health care provider which exercises are safe for you. Do exercises exactly as told by your health care provider and adjust them as directed. It is normal to feel mild stretching, pulling, tightness, or discomfort as you do these exercises, but you should stop right away if you feel sudden pain or your pain gets worse.Do not begin these exercises until told by your health care provider. STRETCHING AND RANGE OF MOTION EXERCISES These exercises warm up your muscles and joints and improve the movement and flexibility of your knee. These exercises also help to relieve pain, numbness, and tingling. Exercise A: Knee Extension, Prone 1. Lie on your abdomen on a bed. 2. Place your left / right knee just beyond the edge of the surface so your knee is not on the bed. You can put a towel under your left / right thigh just above your knee for comfort. 3. Relax your leg muscles and allow gravity to straighten your knee. You should feel a stretch behind your left / right knee. 4. Hold this position for __________ seconds. 5. Scoot up so your knee is supported between repetitions. Repeat __________ times. Complete this stretch __________ times a day. Exercise B: Knee Flexion, Active  1. Lie on your back with both knees straight. If this causes back discomfort, bend your left / right knee so your foot is flat on the floor. 2. Slowly slide your left / right heel back toward your buttocks until you feel a gentle stretch in the front of your knee or thigh. 3. Hold this position for __________ seconds. 4. Slowly slide your left / right heel back to the starting position. Repeat __________ times. Complete this exercise __________ times a day. Exercise C: Quadriceps, Prone  1. Lie on your abdomen on a firm surface, such as a bed or padded floor. 2. Bend your left / right knee and hold your ankle. If you cannot reach your ankle or pant leg, loop a belt around your foot and grab the belt  instead. 3. Gently pull your heel toward your buttocks. Your knee should not slide out to the side. You should feel a stretch in the front of your thigh and knee. 4. Hold this position for __________ seconds. Repeat __________ times. Complete this stretch __________ times a day. Exercise D: Hamstring, Supine 1. Lie on your back. 2. Loop a belt or towel over the ball of your left / right foot. The ball of your foot is on the walking surface, right under your toes. 3. Straighten your left / right knee and slowly pull on the belt to raise your leg until you feel a gentle stretch behind your knee. ? Do not let your left / right knee bend while you do this. ? Keep your other leg flat on the floor. 4. Hold this position for __________ seconds. Repeat __________ times. Complete this stretch __________ times a day. STRENGTHENING EXERCISES These exercises build strength and endurance in your knee. Endurance is the ability to use your muscles for a long time, even after they get tired. Exercise E: Quadriceps, Isometric  1. Lie on your back with your left / right leg extended and your other knee bent. Put a rolled towel or small pillow under your knee if told by your health care provider. 2. Slowly tense the muscles in the front of your left / right thigh. You should see your kneecap slide up toward your hip or see increased dimpling just above the knee. This   motion will push the back of the knee toward the floor. 3. For __________ seconds, keep the muscle as tight as you can without increasing your pain. 4. Relax the muscles slowly and completely. Repeat __________ times. Complete this exercise __________ times a day. Exercise F: Straight Leg Raises - Quadriceps 1. Lie on your back with your left / right leg extended and your other knee bent. 2. Tense the muscles in the front of your left / right thigh. You should see your kneecap slide up or see increased dimpling just above the knee. Your thigh may  even shake a bit. 3. Keep these muscles tight as you raise your leg 4-6 inches (10-15 cm) off the floor. Do not let your knee bend. 4. Hold this position for __________ seconds. 5. Keep these muscles tense as you lower your leg. 6. Relax your muscles slowly and completely after each repetition. Repeat __________ times. Complete this exercise __________ times a day. Exercise G: Hamstring, Isometric 1. Lie on your back on a firm surface. 2. Bend your left / right knee approximately __________ degrees. 3. Dig your left / right heel into the surface as if you are trying to pull it toward your buttocks. Tighten the muscles in the back of your thighs to dig as hard as you can without increasing any pain. 4. Hold this position for __________ seconds. 5. Release the tension gradually and allow your muscles to relax completely for __________ seconds after each repetition. Repeat __________ times. Complete this exercise __________ times a day. Exercise H: Hamstring Curls  If told by your health care provider, do this exercise while wearing ankle weights. Begin with __________ weights. Then increase the weight by 1 lb (0.5 kg) increments. Do not wear ankle weights that are more than __________. 1. Lie on your abdomen with your legs straight. 2. Bend your left / right knee as far as you can without feeling pain. Keep your hips flat against the floor. 3. Hold this position for __________ seconds. 4. Slowly lower your leg to the starting position.  Repeat __________ times. Complete this exercise __________ times a day. Exercise I: Squats (Quadriceps) 1. Stand in front of a table, with your feet and knees pointing straight ahead. You may rest your hands on the table for balance but not for support. 2. Slowly bend your knees and lower your hips like you are going to sit in a chair. ? Keep your weight over your heels, not over your toes. ? Keep your lower legs upright so they are parallel with the table  legs. ? Do not let your hips go lower than your knees. ? Do not bend lower than told by your health care provider. ? If your knee pain increases, do not bend as low. 3. Hold the squat position for __________ seconds. 4. Slowly push with your legs to return to standing. Do not use your hands to pull yourself to standing. Repeat __________ times. Complete this exercise __________ times a day. Exercise J: Wall Slides (Quadriceps)  1. Lean your back against a smooth wall or door while you walk your feet out 18-24 inches (46-61 cm) from it. 2. Place your feet hip-width apart. 3. Slowly slide down the wall or door until your knees bend __________ degrees. Keep your knees over your heels, not over your toes. Keep your knees in line with your hips. 4. Hold for __________ seconds. Repeat __________ times. Complete this exercise __________ times a day. Exercise K: Straight Leg Raises -   Hip Abductors 1. Lie on your side with your left / right leg in the top position. Lie so your head, shoulder, knee, and hip line up. You may bend your bottom knee to help you keep your balance. 2. Roll your hips slightly forward so your hips are stacked directly over each other and your left / right knee is facing forward. 3. Leading with your heel, lift your top leg 4-6 inches (10-15 cm). You should feel the muscles in your outer hip lifting. ? Do not let your foot drift forward. ? Do not let your knee roll toward the ceiling. 4. Hold this position for __________ seconds. 5. Slowly return your leg to the starting position. 6. Let your muscles relax completely after each repetition. Repeat __________ times. Complete this exercise __________ times a day. Exercise L: Straight Leg Raises - Hip Extensors 1. Lie on your abdomen on a firm surface. You can put a pillow under your hips if that is more comfortable. 2. Tense the muscles in your buttocks and lift your left / right leg about 4-6 inches (10-15 cm). Keep your knee  straight as you lift your leg. 3. Hold this position for __________ seconds. 4. Slowly lower your leg to the starting position. 5. Let your leg relax completely after each repetition. Repeat __________ times. Complete this exercise __________ times a day. This information is not intended to replace advice given to you by your health care provider. Make sure you discuss any questions you have with your health care provider. Document Released: 06/09/2005 Document Revised: 04/19/2016 Document Reviewed: 06/01/2015 [employer] Interactive Patient Education  2018 [employer] Back Exercises The following exercises strengthen the muscles that help to support the back. They also help to keep the lower back flexible. Doing these exercises can help to prevent back pain or lessen existing pain. If you have back pain or discomfort, try doing these exercises 2-3 times each day or as told by your health care provider. When the pain goes away, do them once each day, but increase the number of times that you repeat the steps for each exercise (do more repetitions). If you do not have back pain or discomfort, do these exercises once each day or as told by your health care provider. Exercises Single Knee to Chest  Repeat these steps 3-5 times for each leg: 1. Lie on your back on a firm bed or the floor with your legs extended. 2. Bring one knee to your chest. Your other leg should stay extended and in contact with the floor. 3. Hold your knee in place by grabbing your knee or thigh. 4. Pull on your knee until you feel a gentle stretch in your lower back. 5. Hold the stretch for 10-30 seconds. 6. Slowly release and straighten your leg.  Pelvic Tilt  Repeat these steps 5-10 times: 1. Lie on your back on a firm bed or the floor with your legs extended. 2. Bend your knees so they are pointing toward the ceiling and your feet are flat on the floor. 3. Tighten your lower abdominal muscles to press your lower back  against the floor. This motion will tilt your pelvis so your tailbone points up toward the ceiling instead of pointing to your feet or the floor. 4. With gentle tension and even breathing, hold this position for 5-10 seconds.  Cat-Cow  Repeat these steps until your lower back becomes more flexible: 1. Get into a hands-and-knees position on a firm surface. Keep your hands under   your shoulders, and keep your knees under your hips. You may place padding under your knees for comfort. 2. Let your head hang down, and point your tailbone toward the floor so your lower back becomes rounded like the back of a cat. 3. Hold this position for 5 seconds. 4. Slowly lift your head and point your tailbone up toward the ceiling so your back forms a sagging arch like the back of a cow. 5. Hold this position for 5 seconds.  Press-Ups  Repeat these steps 5-10 times: 1. Lie on your abdomen (face-down) on the floor. 2. Place your palms near your head, about shoulder-width apart. 3. While you keep your back as relaxed as possible and keep your hips on the floor, slowly straighten your arms to raise the top half of your body and lift your shoulders. Do not use your back muscles to raise your upper torso. You may adjust the placement of your hands to make yourself more comfortable. 4. Hold this position for 5 seconds while you keep your back relaxed. 5. Slowly return to lying flat on the floor.  Bridges  Repeat these steps 10 times: 1. Lie on your back on a firm surface. 2. Bend your knees so they are pointing toward the ceiling and your feet are flat on the floor. 3. Tighten your buttocks muscles and lift your buttocks off of the floor until your waist is at almost the same height as your knees. You should feel the muscles working in your buttocks and the back of your thighs. If you do not feel these muscles, slide your feet 1-2 inches farther away from your buttocks. 4. Hold this position for 3-5  seconds. 5. Slowly lower your hips to the starting position, and allow your buttocks muscles to relax completely.  If this exercise is too easy, try doing it with your arms crossed over your chest. Abdominal Crunches  Repeat these steps 5-10 times: 1. Lie on your back on a firm bed or the floor with your legs extended. 2. Bend your knees so they are pointing toward the ceiling and your feet are flat on the floor. 3. Cross your arms over your chest. 4. Tip your chin slightly toward your chest without bending your neck. 5. Tighten your abdominal muscles and slowly raise your trunk (torso) high enough to lift your shoulder blades a tiny bit off of the floor. Avoid raising your torso higher than that, because it can put too much stress on your low back and it does not help to strengthen your abdominal muscles. 6. Slowly return to your starting position.  Back Lifts Repeat these steps 5-10 times: 1. Lie on your abdomen (face-down) with your arms at your sides, and rest your forehead on the floor. 2. Tighten the muscles in your legs and your buttocks. 3. Slowly lift your chest off of the floor while you keep your hips pressed to the floor. Keep the back of your head in line with the curve in your back. Your eyes should be looking at the floor. 4. Hold this position for 3-5 seconds. 5. Slowly return to your starting position.  Contact a health care provider if:  Your back pain or discomfort gets much worse when you do an exercise.  Your back pain or discomfort does not lessen within 2 hours after you exercise. If you have any of these problems, stop doing these exercises right away. Do not do them again unless your health care provider says that you can.   Get help right away if:  You develop sudden, severe back pain. If this happens, stop doing the exercises right away. Do not do them again unless your health care provider says that you can. This information is not intended to replace advice  given to you by your health care provider. Make sure you discuss any questions you have with your health care provider. Document Released: 09/02/2004 Document Revised: 12/03/2015 Document Reviewed: 09/19/2014 Elsevier Interactive Patient Education  2017 [employer]  

## 2018-01-06 LAB — CYCLIC CITRUL PEPTIDE ANTIBODY, IGG: Cyclic Citrullin Peptide Ab: <16 UNITS

## 2018-01-06 LAB — IRON,TIBC AND FERRITIN PANEL
%SAT: 52 % (calc) — ABNORMAL HIGH (ref 11–50)
Ferritin: 73 ng/mL (ref 20–288)
Iron: 140 ug/dL (ref 45–160)
TIBC: 267 mcg/dL (calc) (ref 250–450)

## 2018-01-06 LAB — URIC ACID: Uric Acid, Serum: 7.1 mg/dL — ABNORMAL HIGH (ref 2.5–7.0)

## 2018-01-06 LAB — CK: Total CK: 47 U/L (ref 29–143)

## 2018-01-06 NOTE — Progress Notes (Signed)
We will discuss at the follow-up visit.

## 2018-01-13 ENCOUNTER — Other Ambulatory Visit: Payer: Self-pay | Admitting: Family Medicine

## 2018-01-23 DIAGNOSIS — M19041 Primary osteoarthritis, right hand: Secondary | ICD-10-CM | POA: Insufficient documentation

## 2018-01-23 DIAGNOSIS — M5136 Other intervertebral disc degeneration, lumbar region: Secondary | ICD-10-CM | POA: Insufficient documentation

## 2018-01-23 DIAGNOSIS — M51369 Other intervertebral disc degeneration, lumbar region without mention of lumbar back pain or lower extremity pain: Secondary | ICD-10-CM | POA: Insufficient documentation

## 2018-01-23 DIAGNOSIS — M4126 Other idiopathic scoliosis, lumbar region: Secondary | ICD-10-CM | POA: Insufficient documentation

## 2018-01-23 DIAGNOSIS — M19042 Primary osteoarthritis, left hand: Principal | ICD-10-CM

## 2018-01-23 DIAGNOSIS — M170 Bilateral primary osteoarthritis of knee: Secondary | ICD-10-CM | POA: Insufficient documentation

## 2018-01-23 DIAGNOSIS — M19072 Primary osteoarthritis, left ankle and foot: Secondary | ICD-10-CM

## 2018-01-23 DIAGNOSIS — M19071 Primary osteoarthritis, right ankle and foot: Secondary | ICD-10-CM | POA: Insufficient documentation

## 2018-01-23 NOTE — Progress Notes (Deleted)
Office Visit Note  Patient: Deanna Salinas             Date of Birth: January 28, 1936           MRN: [id number]             PCP: Kathyrn Drown, MD Referring: Kathyrn Drown, MD Visit Date: 02/02/2018 Occupation: @GUAROCC @    Subjective:    History of Present Illness: Deanna Salinas is a 82 y.o. female with history of osteoarthritis and DDD.   Activities of Daily Living:  Patient reports morning stiffness for 1 hour.   Patient Reports nocturnal pain.  Difficulty dressing/grooming: Denies Difficulty climbing stairs: Reports Difficulty getting out of chair: Reports Difficulty using hands for taps, buttons, cutlery, and/or writing: Reports   Review of Systems  Constitutional: Positive for fatigue.  HENT: Negative for mouth sores, mouth dryness and nose dryness.   Eyes: Negative for pain, visual disturbance and dryness.  Respiratory: Negative for cough, hemoptysis, shortness of breath and difficulty breathing.   Cardiovascular: Negative for chest pain, palpitations, hypertension and swelling in legs/feet.  Gastrointestinal: Negative for abdominal pain, blood in stool, constipation and diarrhea.  Endocrine: Negative for increased urination.  Genitourinary: Negative for painful urination and pelvic pain.  Musculoskeletal: Positive for arthralgias, gait problem, joint pain, joint swelling and morning stiffness. Negative for myalgias, muscle weakness, muscle tenderness and myalgias.  Skin: Positive for rash. Negative for color change, pallor, hair loss, nodules/bumps, skin tightness, ulcers and sensitivity to sunlight.  Allergic/Immunologic: Negative for susceptible to infections.  Neurological: Positive for weakness. Negative for dizziness, light-headedness, numbness, headaches and memory loss.  Hematological: Negative for bruising/bleeding tendency and swollen glands.  Psychiatric/Behavioral: Negative for depressed mood, confusion and sleep disturbance. The patient is not  nervous/anxious.     PMFS History:  Patient Active Problem List   Diagnosis Date Noted  . Primary osteoarthritis of both hands 01/23/2018  . Primary osteoarthritis of both knees 01/23/2018  . Primary osteoarthritis of both feet 01/23/2018  . Other idiopathic scoliosis, lumbar region 01/23/2018  . DDD (degenerative disc disease), lumbar 01/23/2018  . Hypothyroidism 08/20/2017  . Morbid obesity ([place]) 12/11/2016  . Leukocytosis 06/18/2015  . Type 2 diabetes mellitus ([place]) 06/16/2015  . Abdominal pain, epigastric 02/18/2014  . Elevated lipase 02/18/2014  . Pancreatitis, acute 12/11/2013  . POSTMENOPAUSAL OSTEOPOROSIS 02/17/2009  . Osteoarthritis 03/29/2008  . Insomnia 09/14/2007  . Hyperlipidemia 08/26/2006  . DEPRESSION 08/26/2006  . MIGRAINE HEADACHE 08/26/2006  . CATARACT NOS 08/26/2006  . Essential hypertension 08/26/2006  . SCIATICA 08/26/2006    Past Medical History:  Diagnosis Date  . Essential hypertension   . Fatty liver   . Hyperlipidemia   . Type 2 diabetes mellitus ([facility])     Family History  Problem Relation Age of Onset  . Hypertension Father   . Hypertension Other   . Hypertension Other   . Colon cancer Neg Hx    Past Surgical History:  Procedure Laterality Date  . APPENDECTOMY     At the time of hysterectomy  . CESAREAN SECTION     X5  . COLONOSCOPY    . COLONOSCOPY N/A 06/11/2013   Colonic diverticulosis, hyperplastic polyps. no further screening colonoscopies recommended  . ESOPHAGOGASTRODUODENOSCOPY N/A 02/20/2014   Procedure: ESOPHAGOGASTRODUODENOSCOPY (EGD);  Surgeon: Daneil Dolin, MD;  Location: [facility];  Service: Endoscopy;  Laterality: N/A;  11:00   Social History   Social History Narrative  . Not on file  Objective: Vital Signs: There were no vitals taken for this visit.   Physical Exam  Constitutional: She is oriented to person, place, and time. She appears well-developed and well-nourished.  HENT:  Head: Normocephalic  and atraumatic.  Eyes: Conjunctivae and EOM are normal.  Neck: Normal range of motion.  Cardiovascular: Normal rate, regular rhythm, normal heart sounds and intact distal pulses.  Pulmonary/Chest: Effort normal and breath sounds normal.  Abdominal: Soft. Bowel sounds are normal.  Lymphadenopathy:    She has no cervical adenopathy.  Neurological: She is alert and oriented to person, place, and time.  Skin: Skin is warm and dry. Capillary refill takes less than 2 seconds.  Psychiatric: She has a normal mood and affect. Her behavior is normal.  Nursing note and vitals reviewed.    Musculoskeletal Exam:   CDAI Exam: No CDAI exam completed.    Investigation: No additional findings. 01/05/18 iron studies normal with the percent saturation 52%, CK 47, anti-CCP negative, uric acid 7.1  Imaging: Xr Foot 2 Views Left  Result Date: 01/05/2018 First MTP narrowing and subluxation was noted.  PIP and DIP narrowing was noted.  Intertarsal joint space narrowing was noted.  No subtalar joint space narrowing was noted.  Posterior calcaneal spur was noted. Impression: These findings are consistent with osteoarthritis of the foot.  Xr Foot 2 Views Right  Result Date: 01/05/2018 First MTP narrowing PIP and DIP narrowing was noted.  Some cystic changes were noted in the intertarsal joints.  Narrowing of intertarsal joints was noted.  Small posterior calcaneal spur was noted. Impression: These findings are consistent with osteoarthritis of the foot.  Xr Hand 2 View Left  Result Date: 01/05/2018 Severe CMC narrowing and subluxation was noted.  PIP and DIP narrowing was noted.  No intercarpal joint space narrowing was noted.  Possible erosive and cystic changes were noted in the base of metacarpals.  No intercarpal radiocarpal joint space narrowing was noted. Impression: These findings are consistent with osteoarthritis.  Changes in the base of metacarpal raises concern of possible inflammatory  arthritis.  Xr Hand 2 View Right  Result Date: 01/05/2018 Right CMC severe narrowing was noted.  PIP and DIP narrowing was noted.  No significant intercarpal radiocarpal joint space narrowing was noted.  Some erosive versus cystic changes were noted in the ulnar styloid and the carpal bones. Impression: These findings are consistent with osteoarthritis of the hand.  The changes in the ulnar styloid raises concern of inflammatory arthritis.  Xr Knee 3 View Left  Result Date: 01/05/2018 Severe medial compartment narrowing with medial and lateral large osteophytes was noted.  Mild patellofemoral narrowing was noted. Impression: These findings are consistent with severe osteoarthritis and mild chondromalacia patella.  No chondrocalcinosis was noted.  Xr Knee 3 View Right  Result Date: 01/05/2018 Severe lateral compartment narrowing with lateral, medial and intercondylar osteophytes was noted.  No chondrocalcinosis was noted.  Severe patellofemoral narrowing was noted. Impression: These findings are consistent with severe osteoarthritis and severe chondromalacia patella of the knee joint.  Xr Lumbar Spine 2-3 Views  Result Date: 01/05/2018 Severe levoscoliosis of lumbar spine with multilevel spondylosis.  Severe narrowing was noted between L2-3, L3-4, L4-5 and facet joint arthropathy was noted.  impression: These findings are consistent with severe levoscoliosis degenerative disc disease and facet joint arthropathy.   Speciality Comments: No specialty comments available.    Procedures:  No procedures performed Allergies: Ambien [zolpidem tartrate]; Lipitor [atorvastatin]; Metformin and related; Pravastatin; and Trazodone and nefazodone  Assessment / Plan:     Visit Diagnoses: Primary osteoarthritis of both hands  Primary osteoarthritis of both knees - Bilateral severe, right severe chondromalacia patella, left mild chondromalacia patella.  Primary osteoarthritis of both feet  Other  idiopathic scoliosis, lumbar region - Severe levoscoliosis  DDD (degenerative disc disease), lumbar - With facet joint arthropathy    History of osteoporosis - DEXA 08/29/17: BMD as determined from [facility] Neck Left is 0.913 g/cm2 with a T-Scoreof -0.9  Essential hypertension  History of diabetes mellitus, type II  History of hypothyroidism  History of hyperlipidemia  History of migraine   Orders: No orders of the defined types were placed in this encounter.  No orders of the defined types were placed in this encounter.   Face-to-face time spent with patient was *** minutes. 50% of time was spent in counseling and coordination of care.  Follow-Up Instructions: No follow-ups on file.   Bo Merino, MD  Note - This record has been created using Editor, commissioning.  Chart creation errors have been sought, but may not always  have been located. Such creation errors do not reflect on  the standard of medical care.

## 2018-01-30 ENCOUNTER — Other Ambulatory Visit: Payer: Self-pay | Admitting: Family Medicine

## 2018-02-02 ENCOUNTER — Ambulatory Visit (INDEPENDENT_AMBULATORY_CARE_PROVIDER_SITE_OTHER): Payer: Medicare Other | Admitting: Rheumatology

## 2018-02-02 ENCOUNTER — Encounter: Payer: Self-pay | Admitting: Rheumatology

## 2018-02-02 VITALS — BP 128/88 | HR 79 | Resp 17 | Ht 67.0 in | Wt 230.0 lb

## 2018-02-02 DIAGNOSIS — M170 Bilateral primary osteoarthritis of knee: Secondary | ICD-10-CM

## 2018-02-02 DIAGNOSIS — M19071 Primary osteoarthritis, right ankle and foot: Secondary | ICD-10-CM

## 2018-02-02 DIAGNOSIS — M5136 Other intervertebral disc degeneration, lumbar region: Secondary | ICD-10-CM

## 2018-02-02 DIAGNOSIS — Z5321 Procedure and treatment not carried out due to patient leaving prior to being seen by health care provider: Secondary | ICD-10-CM

## 2018-02-02 DIAGNOSIS — M4126 Other idiopathic scoliosis, lumbar region: Secondary | ICD-10-CM

## 2018-02-02 DIAGNOSIS — M19041 Primary osteoarthritis, right hand: Secondary | ICD-10-CM

## 2018-02-02 DIAGNOSIS — M19072 Primary osteoarthritis, left ankle and foot: Secondary | ICD-10-CM

## 2018-02-02 DIAGNOSIS — M19042 Primary osteoarthritis, left hand: Secondary | ICD-10-CM

## 2018-02-03 NOTE — Progress Notes (Signed)
Patient left without being seen.

## 2018-02-06 NOTE — Progress Notes (Signed)
Office Visit Note  Patient: Deanna Salinas             Date of Birth: 06-May-1936           MRN: [id number]             PCP: Kathyrn Drown, MD Referring: Kathyrn Drown, MD Visit Date: 02/14/2018 Occupation: @GUAROCC @    Subjective:  Osteoarthritis and joint discomfort.  History of Present Illness: JAQUELINE Salinas is a 82 y.o. female with history of osteoarthritis and DDD.  Patient states that she has some discomfort in her elbows.  She does have underlying osteoarthritis in her hands knee joints and feet which causes some discomfort.  She states she says she has been taking CBD oil her joint discomfort and pain has been better.  She has been using oral and topical CBD oil.  Activities of Daily Living:  Patient reports morning stiffness for 45  minutes.   Patient Reports nocturnal pain.  Difficulty dressing/grooming: Denies Difficulty climbing stairs: Reports Difficulty getting out of chair: Reports Difficulty using hands for taps, buttons, cutlery, and/or writing: Reports   Review of Systems  Constitutional: Positive for activity change and fatigue. Negative for night sweats, weight gain and weight loss.  HENT: Negative for mouth sores, trouble swallowing, trouble swallowing, mouth dryness and nose dryness.   Eyes: Negative for pain, redness, visual disturbance and dryness.  Respiratory: Negative for cough, shortness of breath and difficulty breathing.   Cardiovascular: Negative for chest pain, palpitations, hypertension, irregular heartbeat and swelling in legs/feet.  Gastrointestinal: Negative for blood in stool, constipation and diarrhea.  Endocrine: Negative for excessive thirst and increased urination.  Genitourinary: Negative for difficulty urinating and vaginal dryness.  Musculoskeletal: Positive for arthralgias, gait problem, joint pain, morning stiffness and muscle tenderness. Negative for joint swelling, myalgias, muscle weakness and myalgias.  Skin: Positive for  rash. Negative for color change, hair loss, skin tightness, ulcers and sensitivity to sunlight.  Allergic/Immunologic: Negative for susceptible to infections.  Neurological: Positive for weakness. Negative for dizziness, memory loss and night sweats.  Hematological: Negative for bruising/bleeding tendency and swollen glands.  Psychiatric/Behavioral: Positive for sleep disturbance. Negative for depressed mood. The patient is not nervous/anxious.     PMFS History:  Patient Active Problem List   Diagnosis Date Noted  . Primary osteoarthritis of both hands 01/23/2018  . Primary osteoarthritis of both knees 01/23/2018  . Primary osteoarthritis of both feet 01/23/2018  . Other idiopathic scoliosis, lumbar region 01/23/2018  . DDD (degenerative disc disease), lumbar 01/23/2018  . Hypothyroidism 08/20/2017  . Morbid obesity ([place]) 12/11/2016  . Leukocytosis 06/18/2015  . Type 2 diabetes mellitus ([place]) 06/16/2015  . Abdominal pain, epigastric 02/18/2014  . Elevated lipase 02/18/2014  . Pancreatitis, acute 12/11/2013  . POSTMENOPAUSAL OSTEOPOROSIS 02/17/2009  . Osteoarthritis 03/29/2008  . Insomnia 09/14/2007  . Hyperlipidemia 08/26/2006  . DEPRESSION 08/26/2006  . MIGRAINE HEADACHE 08/26/2006  . CATARACT NOS 08/26/2006  . Essential hypertension 08/26/2006  . SCIATICA 08/26/2006    Past Medical History:  Diagnosis Date  . Essential hypertension   . Fatty liver   . Hyperlipidemia   . Type 2 diabetes mellitus (HCC)     Family History  Problem Relation Age of Onset  . Heart Problems Father   . Hypertension Other   . Hypertension Other   . Cancer Mother   . Cancer Daughter        breast   . Colon cancer Neg Hx  Past Surgical History:  Procedure Laterality Date  . APPENDECTOMY     At the time of hysterectomy  . CESAREAN SECTION     X5  . COLONOSCOPY    . COLONOSCOPY N/A 06/11/2013   Colonic diverticulosis, hyperplastic polyps. no further screening colonoscopies recommended    . ESOPHAGOGASTRODUODENOSCOPY N/A 02/20/2014   Procedure: ESOPHAGOGASTRODUODENOSCOPY (EGD);  Surgeon: Daneil Dolin, MD;  Location: [facility];  Service: Endoscopy;  Laterality: N/A;  11:00   Social History   Social History Narrative  . Not on file     Objective: Vital Signs: BP 118/88 (BP Location: Left Arm, Patient Position: Sitting, Cuff Size: Normal)   Pulse (!) 106   Resp 20   Ht 5\' 7"  (1.702 m)   Wt 228 lb (103.4 kg)   BMI 35.71 kg/m    Physical Exam  Constitutional: She is oriented to person, place, and time. She appears well-developed and well-nourished.  HENT:  Head: Normocephalic and atraumatic.  Eyes: Conjunctivae and EOM are normal.  Neck: Normal range of motion.  Cardiovascular: Normal rate, regular rhythm, normal heart sounds and intact distal pulses.  Pulmonary/Chest: Effort normal and breath sounds normal.  Abdominal: Soft. Bowel sounds are normal.  Lymphadenopathy:    She has no cervical adenopathy.  Neurological: She is alert and oriented to person, place, and time.  Skin: Skin is warm and dry. Capillary refill takes less than 2 seconds.  Psychiatric: She has a normal mood and affect. Her behavior is normal.  Nursing note and vitals reviewed.    Musculoskeletal Exam: C-spine thoracic lumbar spine good range of motion.  Shoulder joints elbow joints wrist joints are good range of motion.  She has some DIP and CMC thickening.  Hip joints were in good range of motion.  She is some crepitus in her knee joints with some warmth in her left knee joint.  CDAI Exam: No CDAI exam completed.    Investigation: Findings:  01/05/2018: Iron 140, TIBC 267, %saturation 52, ferritin 73, CK 47, CCP negative, uric acid 7.1  Component     Latest Ref Rng & Units 01/05/2018  Iron     45 - 160 mcg/dL 140  TIBC     250 - 450 mcg/dL (calc) 267  %SAT     11 - 50 % (calc) 52 (H)  Ferritin     20 - 288 ng/mL 73  CK Total     29 - 062 U/L 47  Cyclic Citrullin Peptide  Ab     UNITS <16  Uric Acid, Serum     2.5 - 7.0 mg/dL 7.1 (H)   CBC Latest Ref Rng & Units 11/17/2017 04/14/2017 12/10/2016  WBC 3.4 - 10.8 x10E3/uL - 9.9 12.9(H)  Hemoglobin 12.2 - 16.2 g/dL 12.1(A) 12.6 13.3  Hematocrit 34.0 - 46.6 % - 40.0 41.0  Platelets 150 - 379 x10E3/uL - 267 276   CMP Latest Ref Rng & Units 11/14/2017 08/13/2017 12/08/2016  Glucose 65 - 99 mg/dL 133(H) 138(H) 148(H)  BUN 8 - 27 mg/dL 16 21 24   Creatinine 0.57 - 1.00 mg/dL 1.00 1.03(H) 1.08(H)  Sodium 134 - 144 mmol/L 142 142 139  Potassium 3.5 - 5.2 mmol/L 5.8(H) 4.7 4.9  Chloride 96 - 106 mmol/L 99 100 96  CO2 20 - 29 mmol/L 27 26 29   Calcium 8.7 - 10.3 mg/dL 9.9 9.7 10.0  Total Protein 6.0 - 8.5 g/dL 6.6 7.0 6.8  Total Bilirubin 0.0 - 1.2 mg/dL 0.3 0.3 0.3  Alkaline Phos 39 - 117 IU/L 82 95 93  AST 0 - 40 IU/L 18 15 17   ALT 0 - 32 IU/L 13 14 21      Imaging: No results found.  Speciality Comments: No specialty comments available.    Procedures:  No procedures performed Allergies: Ambien [zolpidem tartrate]; Lipitor [atorvastatin]; Metformin and related; Pravastatin; and Trazodone and nefazodone   Assessment / Plan:     Visit Diagnoses: Primary osteoarthritis of both hands - RF negative, CCP negative, sed rate 5.  Patient had clinical findings consistent with osteoarthritis.  She is doing better with some exercises and also using CBD oil.  I discussed the natural anti-inflammatories to try.  Joint protection was also discussed.  Primary osteoarthritis of both knees - May benefit from left knee replacement.  Patient is not prepared for any kind of surgery.  Weight loss diet and exercise was discussed at length.  Primary osteoarthritis of both feet-proper fitting shoes were discussed.  Myalgia - CK 47  Other idiopathic scoliosis, lumbar region - Severe levoscoliosis and multilevel spondylosis with facet joint arthropathy  DDD (degenerative disc disease), lumbar-she is currently not having much  discomfort.  Essential hypertension  History of osteoporosis - DEXA 08/29/2017 bone mineral density as determined from femur neck left is 0.913g/cm2 with T-score of -0.9  History of diabetes mellitus, type II  History of hypothyroidism  History of hyperlipidemia  History of migraine    Orders: No orders of the defined types were placed in this encounter.  No orders of the defined types were placed in this encounter.   Face-to-face time spent with patient was 30 minutes. Greater than 50% of time was spent in counseling and coordination of care.  Follow-Up Instructions: Return for Osteoarthritis, DDD.   Bo Merino, MD  Note - This record has been created using Editor, commissioning.  Chart creation errors have been sought, but may not always  have been located. Such creation errors do not reflect on  the standard of medical care.

## 2018-02-14 ENCOUNTER — Encounter: Payer: Self-pay | Admitting: Rheumatology

## 2018-02-14 ENCOUNTER — Ambulatory Visit (INDEPENDENT_AMBULATORY_CARE_PROVIDER_SITE_OTHER): Payer: Medicare Other | Admitting: Rheumatology

## 2018-02-14 VITALS — BP 118/88 | HR 106 | Resp 20 | Ht 67.0 in | Wt 228.0 lb

## 2018-02-14 DIAGNOSIS — M5136 Other intervertebral disc degeneration, lumbar region: Secondary | ICD-10-CM

## 2018-02-14 DIAGNOSIS — Z8739 Personal history of other diseases of the musculoskeletal system and connective tissue: Secondary | ICD-10-CM

## 2018-02-14 DIAGNOSIS — M19071 Primary osteoarthritis, right ankle and foot: Secondary | ICD-10-CM | POA: Diagnosis not present

## 2018-02-14 DIAGNOSIS — M791 Myalgia, unspecified site: Secondary | ICD-10-CM

## 2018-02-14 DIAGNOSIS — M19072 Primary osteoarthritis, left ankle and foot: Secondary | ICD-10-CM | POA: Diagnosis not present

## 2018-02-14 DIAGNOSIS — Z8639 Personal history of other endocrine, nutritional and metabolic disease: Secondary | ICD-10-CM | POA: Diagnosis not present

## 2018-02-14 DIAGNOSIS — M4126 Other idiopathic scoliosis, lumbar region: Secondary | ICD-10-CM | POA: Diagnosis not present

## 2018-02-14 DIAGNOSIS — M19042 Primary osteoarthritis, left hand: Secondary | ICD-10-CM

## 2018-02-14 DIAGNOSIS — Z8669 Personal history of other diseases of the nervous system and sense organs: Secondary | ICD-10-CM

## 2018-02-14 DIAGNOSIS — M170 Bilateral primary osteoarthritis of knee: Secondary | ICD-10-CM | POA: Diagnosis not present

## 2018-02-14 DIAGNOSIS — I10 Essential (primary) hypertension: Secondary | ICD-10-CM

## 2018-02-14 DIAGNOSIS — M19041 Primary osteoarthritis, right hand: Secondary | ICD-10-CM | POA: Diagnosis not present

## 2018-02-14 NOTE — Patient Instructions (Signed)
Natural anti-inflammatories  You can purchase these at [employer], [employer] or online.  . Turmeric (capsules)  . Ginger (ginger root or capsules)  . Omega 3 (Fish, flax seeds, chia seeds, walnuts, almonds)  . Tart cherry (dried or extract)   Patient should be under the care of a physician while taking these supplements. This may not be reproduced without the permission of Dr. Waynesha Rammel.  

## 2018-02-16 ENCOUNTER — Ambulatory Visit: Payer: Medicare Other | Admitting: Family Medicine

## 2018-02-22 ENCOUNTER — Ambulatory Visit: Payer: Medicare Other | Admitting: Family Medicine

## 2018-03-02 DIAGNOSIS — H43813 Vitreous degeneration, bilateral: Secondary | ICD-10-CM | POA: Diagnosis not present

## 2018-03-07 ENCOUNTER — Encounter: Payer: Self-pay | Admitting: Family Medicine

## 2018-03-07 ENCOUNTER — Ambulatory Visit (INDEPENDENT_AMBULATORY_CARE_PROVIDER_SITE_OTHER): Payer: Medicare Other | Admitting: Family Medicine

## 2018-03-07 VITALS — BP 130/78 | Ht 67.0 in | Wt 232.9 lb

## 2018-03-07 DIAGNOSIS — E7849 Other hyperlipidemia: Secondary | ICD-10-CM

## 2018-03-07 DIAGNOSIS — E039 Hypothyroidism, unspecified: Secondary | ICD-10-CM

## 2018-03-07 DIAGNOSIS — I10 Essential (primary) hypertension: Secondary | ICD-10-CM | POA: Diagnosis not present

## 2018-03-07 DIAGNOSIS — E875 Hyperkalemia: Secondary | ICD-10-CM

## 2018-03-07 DIAGNOSIS — M170 Bilateral primary osteoarthritis of knee: Secondary | ICD-10-CM | POA: Diagnosis not present

## 2018-03-07 DIAGNOSIS — E119 Type 2 diabetes mellitus without complications: Secondary | ICD-10-CM | POA: Diagnosis not present

## 2018-03-07 NOTE — Progress Notes (Signed)
Subjective:    Patient ID: Deanna Salinas, female    DOB: 1936-07-20, 82 y.o.   MRN: [id number]  Hypertension  This is a chronic problem. The current episode started more than 1 year ago. Pertinent negatives include no chest pain or shortness of breath. Risk factors for coronary artery disease include post-menopausal state, diabetes mellitus and dyslipidemia. Treatments tried: lasix, accupril. There are no compliance problems.    This patient was seen today for chronic pain  The medication list was reviewed and updated.   -Compliance with medication: She states she is compliant  - Number patient states they take daily: She takes anywhere between 1 and 3/day  -when was the last dose patient took?  Earlier today  The patient was advised the importance of maintaining medication and not using illegal substances with these.  Here for refills and follow up  The patient was educated that we can provide 3 monthly scripts for their medication, it is their responsibility to follow the instructions.  Side effects or complications from medications: No side effects currently does not cause drowsiness does help her with her pain  Patient is aware that pain medications are meant to minimize the severity of the pain to allow their pain levels to improve to allow for better function. They are aware of that pain medications cannot totally remove their pain.  Due for UDT ( at least once per year) : Later this year  Patient has diabetes that is diet-controlled this is been going on for a while watching diet closely not on any medicines is due for lab work        Review of Systems  Constitutional: Negative for activity change, appetite change and fatigue.  HENT: Negative for congestion and rhinorrhea.   Respiratory: Negative for cough and shortness of breath.   Cardiovascular: Negative for chest pain and leg swelling.  Gastrointestinal: Negative for abdominal pain and diarrhea.  Endocrine:  Negative for polydipsia and polyphagia.  Musculoskeletal: Positive for arthralgias and back pain.  Skin: Negative for color change.  Neurological: Negative for dizziness and weakness.  Psychiatric/Behavioral: Negative for behavioral problems and confusion.       Objective:   Physical Exam  Constitutional: She appears well-nourished. No distress.  HENT:  Head: Normocephalic and atraumatic.  Eyes: Right eye exhibits no discharge. Left eye exhibits no discharge.  Neck: No tracheal deviation present.  Cardiovascular: Normal rate, regular rhythm and normal heart sounds.  No murmur heard. Pulmonary/Chest: Effort normal and breath sounds normal. No respiratory distress.  Musculoskeletal: She exhibits no edema.  Lymphadenopathy:    She has no cervical adenopathy.  Neurological: She is alert. Coordination normal.  Skin: Skin is warm and dry.  Psychiatric: She has a normal mood and affect. Her behavior is normal.  Vitals reviewed.         Assessment & Plan:  The patient was seen in followup for chronic pain. A review over at their current pain status was discussed. Drug registry was checked. Prescriptions were given. Discussion was held regarding the importance of compliance with medication as well as pain medication contract.  Time for questions regarding pain management plan occurred. Importance of regular followup visits was discussed. Patient was informed that medication may cause drowsiness and should not be combined  with other medications/alcohol or street drugs. Patient was cautioned that medication could cause drowsiness. If the patient feels medication is causing altered alertness then do not drive or operate dangerous equipment.  HTN- Patient was seen  today as part of a visit regarding hypertension. The importance of healthy diet and regular physical activity was discussed. The importance of compliance with medications discussed.  Ideal goal is to keep blood pressure low  elevated levels certainly below 161/09 when possible.  The patient was counseled that keeping blood pressure under control lessen his risk of complications.  The importance of regular follow-ups was discussed with the patient.  Low-salt diet such as DASH recommended.  Regular physical activity was recommended as well.  Patient was advised to keep regular follow-ups.  Patient was seen today regarding hypothyroidism.  Importance of healthy diet, regular physical activity was discussed.  Importance of compliance with medication and regular checks regarding this was discussed.   Osteoarthritis use pain medicine as necessary  Diabetes watch diet check lab work await results follow-up if ongoing troubles  Follow-up again in 3 to 4 months  25 minutes was spent with the patient.  This statement verifies that 25 minutes was indeed spent with the patient.  More than 50% of this visit-total duration of the visit-was spent in counseling and coordination of care. The issues that the patient came in for today as reflected in the diagnosis (s) please refer to documentation for further details.

## 2018-03-08 MED ORDER — HYDROCODONE-ACETAMINOPHEN 5-325 MG PO TABS: ORAL_TABLET | ORAL | 0 refills | Status: DC

## 2018-04-04 ENCOUNTER — Other Ambulatory Visit: Payer: Self-pay | Admitting: Family Medicine

## 2018-04-04 MED ORDER — HYDROCODONE-ACETAMINOPHEN 5-325 MG PO TABS: ORAL_TABLET | ORAL | 0 refills | Status: AC

## 2018-04-06 ENCOUNTER — Other Ambulatory Visit: Payer: Self-pay | Admitting: Family Medicine

## 2018-04-12 ENCOUNTER — Telehealth: Payer: Self-pay | Admitting: Family Medicine

## 2018-04-12 MED ORDER — ESZOPICLONE 3 MG PO TABS: ORAL_TABLET | ORAL | 0 refills | Status: DC

## 2018-04-12 NOTE — Telephone Encounter (Signed)
May have this +5 refills 

## 2018-04-12 NOTE — Telephone Encounter (Signed)
Prescription faxed to pharmacy. Patient notified. 

## 2018-04-12 NOTE — Telephone Encounter (Signed)
Pt calling to check on the refill her pharmacy sent in for her Eszopiclone 3 MG TABS   Please advise & call pt  [phone] or [place]

## 2018-04-26 ENCOUNTER — Other Ambulatory Visit: Payer: Self-pay | Admitting: Family Medicine

## 2018-04-27 NOTE — Telephone Encounter (Signed)
Acarbose 100 mg not on current medication list. Contacted patient to verify if she is taking this medication. Left message to return call

## 2018-04-27 NOTE — Telephone Encounter (Signed)
She may have 6 refills on each

## 2018-05-29 ENCOUNTER — Other Ambulatory Visit: Payer: Self-pay | Admitting: Family Medicine

## 2018-06-01 ENCOUNTER — Ambulatory Visit (INDEPENDENT_AMBULATORY_CARE_PROVIDER_SITE_OTHER): Payer: Medicare Other

## 2018-06-01 DIAGNOSIS — Z23 Encounter for immunization: Secondary | ICD-10-CM

## 2018-06-19 ENCOUNTER — Other Ambulatory Visit: Payer: Self-pay | Admitting: Family Medicine

## 2018-06-26 ENCOUNTER — Encounter: Payer: Self-pay | Admitting: Family Medicine

## 2018-06-26 ENCOUNTER — Ambulatory Visit (INDEPENDENT_AMBULATORY_CARE_PROVIDER_SITE_OTHER): Payer: Medicare Other | Admitting: Family Medicine

## 2018-06-26 VITALS — BP 136/78 | Ht 67.0 in | Wt 232.0 lb

## 2018-06-26 DIAGNOSIS — I10 Essential (primary) hypertension: Secondary | ICD-10-CM

## 2018-06-26 DIAGNOSIS — E039 Hypothyroidism, unspecified: Secondary | ICD-10-CM

## 2018-06-26 DIAGNOSIS — M170 Bilateral primary osteoarthritis of knee: Secondary | ICD-10-CM | POA: Diagnosis not present

## 2018-06-26 DIAGNOSIS — E7849 Other hyperlipidemia: Secondary | ICD-10-CM | POA: Diagnosis not present

## 2018-06-26 DIAGNOSIS — E119 Type 2 diabetes mellitus without complications: Secondary | ICD-10-CM | POA: Diagnosis not present

## 2018-06-26 MED ORDER — HYDROCODONE-ACETAMINOPHEN 5-325 MG PO TABS: ORAL_TABLET | ORAL | 0 refills | Status: DC

## 2018-06-26 NOTE — Telephone Encounter (Signed)
This was refilled today

## 2018-06-26 NOTE — Progress Notes (Signed)
Subjective:    Patient ID: Deanna Salinas, female    DOB: 02/14/1936, 82 y.o.   MRN: [id number]  Hypertension  This is a chronic problem. The current episode started more than 1 year ago. Pertinent negatives include no chest pain, headaches or shortness of breath. Risk factors for coronary artery disease include post-menopausal state and dyslipidemia. Treatments tried: lasix, accupril. There are no compliance problems.    Patient would like a prescription for some pain medication  This patient was seen today for chronic pain  The medication list was reviewed and updated.   -Compliance with medication: Patient takes pain medicine anywhere between 1 and 3 times daily  - Number patient states they take daily: 1 and 3 times daily  -when was the last dose patient took?  Last evening  The patient was advised the importance of maintaining medication and not using illegal substances with these.  Here for refills and follow up  The patient was educated that we can provide 3 monthly scripts for their medication, it is their responsibility to follow the instructions.  Side effects or complications from medications: Denies side effects denies drowsiness  Patient is aware that pain medications are meant to minimize the severity of the pain to allow their pain levels to improve to allow for better function. They are aware of that pain medications cannot totally remove their pain.  Due for UDT ( at least once per year) : Yearly basis  Patient suffers with insomnia.  This is been going on for a while.  The patient finds it necessary to use medication to help sleep.  Patient finds it if not using medication has significant troubles.  Denies abusing the medication.  Denies any negative side effects. Patient states without her medicine she does not sleep at all with her medicine she sleeps about 3 hours at night she is requesting to continue the medicine states it is not causing her problems  I have  recommended that she take her evening pain medicine around 7 PM so that it will be separated by several hours from her sleep medicine    Review of Systems  Constitutional: Negative for activity change, fatigue and fever.  HENT: Negative for congestion and rhinorrhea.   Respiratory: Negative for cough, chest tightness and shortness of breath.   Cardiovascular: Negative for chest pain and leg swelling.  Gastrointestinal: Negative for abdominal pain and nausea.  Skin: Negative for color change.  Neurological: Negative for dizziness and headaches.  Psychiatric/Behavioral: Negative for agitation and behavioral problems.       Objective:   Physical Exam  Constitutional: She appears well-nourished. No distress.  HENT:  Head: Normocephalic.  Cardiovascular: Normal rate, regular rhythm and normal heart sounds.  No murmur heard. Pulmonary/Chest: Effort normal and breath sounds normal.  Musculoskeletal: She exhibits no edema.  Lymphadenopathy:    She has no cervical adenopathy.  Neurological: She is alert.  Psychiatric: Her behavior is normal.  Vitals reviewed.         Assessment & Plan:  Patient uses a walker to get around because of chronic back pain  Chronic lumbar pain with sciatica uses pain medicine anywhere between 1 and 3 times a day drug registry was checked 3 prescriptions were sent in follow-up in approximately 4 months  Chronic insomnia uses medication at nighttime as necessary for years without difficulty so far  HTN good control continue current measures  Patient was seen today regarding hypothyroidism.  Importance of healthy diet, regular physical  activity was discussed.  Importance of compliance with medication and regular checks regarding this was discussed.   The patient was seen in followup for chronic pain. A review over at their current pain status was discussed. Drug registry was checked. Prescriptions were given. Discussion was held regarding the  importance of compliance with medication as well as pain medication contract.  The patient's BMI is calculated.  The patient does have obesity.  The patient does try to some degree staying active and watching diet.  It is in the vital signs and acknowledged.  It is above the recommended BMI for the patient's height and weight.  The patient has been counseled regarding healthy diet, restricted portions, avoiding excessive carbohydrates/sugary foods, and increase physical activity as health permits.  It is in the patient's best interest to lower the risk of secondary illness including heart disease strokes and cancer by losing weight.  The patient acknowledges this information.   Time for questions regarding pain management plan occurred. Importance of regular followup visits was discussed. Patient was informed that medication may cause drowsiness and should not be combined  with other medications/alcohol or street drugs. Patient was cautioned that medication could cause drowsiness. If the patient feels medication is causing altered alertness then do not drive or operate dangerous equipment.  25 minutes was spent with the patient.  This statement verifies that 25 minutes was indeed spent with the patient.  More than 50% of this visit-total duration of the visit-was spent in counseling and coordination of care. The issues that the patient came in for today as reflected in the diagnosis (s) please refer to documentation for further details.

## 2018-06-27 LAB — BASIC METABOLIC PANEL
BUN/Creatinine Ratio: 20 (ref 12–28)
BUN: 18 mg/dL (ref 8–27)
CO2: 24 mmol/L (ref 20–29)
Calcium: 9.6 mg/dL (ref 8.7–10.3)
Chloride: 100 mmol/L (ref 96–106)
Creatinine, Ser: 0.9 mg/dL (ref 0.57–1.00)
GFR calc Af Amer: 70 mL/min/{1.73_m2} (ref >59)
GFR calc non Af Amer: 61 mL/min/{1.73_m2} (ref >59)
Glucose: 137 mg/dL — ABNORMAL HIGH (ref 65–99)
Potassium: 5.3 mmol/L — ABNORMAL HIGH (ref 3.5–5.2)
Sodium: 140 mmol/L (ref 134–144)

## 2018-06-27 LAB — LIPID PANEL
Chol/HDL Ratio: 3.2 ratio (ref 0.0–4.4)
Cholesterol, Total: 172 mg/dL (ref 100–199)
HDL: 53 mg/dL (ref >39)
LDL Calculated: 83 mg/dL (ref 0–99)
Triglycerides: 179 mg/dL — ABNORMAL HIGH (ref 0–149)
VLDL Cholesterol Cal: 36 mg/dL (ref 5–40)

## 2018-06-27 LAB — HEMOGLOBIN A1C
Est. average glucose Bld gHb Est-mCnc: 146 mg/dL
Hgb A1c MFr Bld: 6.7 % — ABNORMAL HIGH (ref 4.8–5.6)

## 2018-06-27 LAB — TSH: TSH: 2.97 u[IU]/mL (ref 0.450–4.500)

## 2018-06-28 ENCOUNTER — Other Ambulatory Visit: Payer: Self-pay | Admitting: Family Medicine

## 2018-06-28 NOTE — Addendum Note (Signed)
Addended by: Karle Barr on: 06/28/2018 08:47 AM   Modules accepted: Orders

## 2018-06-29 NOTE — Telephone Encounter (Signed)
For the blood pressure medicine may have 90 with 2 refills for the sleep medication 90 with 1 refill

## 2018-07-19 ENCOUNTER — Other Ambulatory Visit: Payer: Self-pay | Admitting: Family Medicine

## 2018-07-27 ENCOUNTER — Other Ambulatory Visit: Payer: Self-pay

## 2018-07-27 ENCOUNTER — Telehealth: Payer: Self-pay | Admitting: Family Medicine

## 2018-07-27 MED ORDER — HYDROCODONE-ACETAMINOPHEN 5-325 MG PO TABS: ORAL_TABLET | ORAL | 0 refills | Status: DC

## 2018-07-27 NOTE — Telephone Encounter (Signed)
That was mistake pt go 90 20 90 for three mo, plzre do and i'll sign

## 2018-07-27 NOTE — Telephone Encounter (Signed)
Pt states when she went to refill medication refill was only for 20 tab not 90tab of HYDROcodone-acetaminophen (NORCO/VICODIN) 5-325 MG tablet.    Pharmacy:  [facility], [place]

## 2018-07-27 NOTE — Telephone Encounter (Signed)
Script printed and awaiting signature. 

## 2018-07-27 NOTE — Telephone Encounter (Signed)
Script signed and patient is aware. Pt states her husband may come by tomorrow to pick the script up or she may come by after dentist appt today.

## 2018-07-27 NOTE — Telephone Encounter (Signed)
Please advise. Thank you

## 2018-08-17 ENCOUNTER — Telehealth: Payer: Self-pay | Admitting: Family Medicine

## 2018-08-17 NOTE — Telephone Encounter (Signed)
Pt has enough for tonight. Aware dr Nicki Reaper will get message tomorrow.

## 2018-08-17 NOTE — Telephone Encounter (Signed)
Last seen 06/26/18

## 2018-08-17 NOTE — Telephone Encounter (Signed)
Rewrite the prescription to be amitriptyline 50 mg, 180, 1-2 nightly as directed, 1 refill  Please inform the patient when Possible try for her to get by with just 1

## 2018-08-17 NOTE — Telephone Encounter (Signed)
Pt requesting refill on amitriptyline (ELAVIL) 50 MG tablet   Pt states the doctor told her she could take 2 at night if needed & her Rx ran out early  Please call pt when done    [facility]

## 2018-08-18 MED ORDER — AMITRIPTYLINE HCL 50 MG PO TABS: ORAL_TABLET | ORAL | 1 refills | Status: DC

## 2018-08-18 NOTE — Telephone Encounter (Signed)
Medication sent in to requested pharmacy. I called and left a message to r/c.

## 2018-08-18 NOTE — Telephone Encounter (Signed)
Patient notified and verbalized understanding. 

## 2018-08-25 DIAGNOSIS — Z6837 Body mass index (BMI) 37.0-37.9, adult: Secondary | ICD-10-CM | POA: Diagnosis not present

## 2018-08-25 DIAGNOSIS — M170 Bilateral primary osteoarthritis of knee: Secondary | ICD-10-CM | POA: Diagnosis not present

## 2018-09-16 ENCOUNTER — Other Ambulatory Visit: Payer: Self-pay | Admitting: Family Medicine

## 2018-10-10 ENCOUNTER — Other Ambulatory Visit: Payer: Self-pay | Admitting: Family Medicine

## 2018-10-16 ENCOUNTER — Other Ambulatory Visit: Payer: Self-pay

## 2018-10-16 DIAGNOSIS — E119 Type 2 diabetes mellitus without complications: Secondary | ICD-10-CM | POA: Diagnosis not present

## 2018-10-16 DIAGNOSIS — E785 Hyperlipidemia, unspecified: Secondary | ICD-10-CM

## 2018-10-17 ENCOUNTER — Other Ambulatory Visit: Payer: Self-pay | Admitting: Family Medicine

## 2018-10-17 LAB — BASIC METABOLIC PANEL
BUN/Creatinine Ratio: 28 (ref 12–28)
BUN: 32 mg/dL — ABNORMAL HIGH (ref 8–27)
CO2: 25 mmol/L (ref 20–29)
Calcium: 9.8 mg/dL (ref 8.7–10.3)
Chloride: 99 mmol/L (ref 96–106)
Creatinine, Ser: 1.13 mg/dL — ABNORMAL HIGH (ref 0.57–1.00)
GFR calc Af Amer: 53 mL/min/{1.73_m2} — ABNORMAL LOW (ref >59)
GFR calc non Af Amer: 46 mL/min/{1.73_m2} — ABNORMAL LOW (ref >59)
Glucose: 119 mg/dL — ABNORMAL HIGH (ref 65–99)
Potassium: 4.7 mmol/L (ref 3.5–5.2)
Sodium: 140 mmol/L (ref 134–144)

## 2018-10-17 LAB — HEMOGLOBIN A1C
Est. average glucose Bld gHb Est-mCnc: 148 mg/dL
Hgb A1c MFr Bld: 6.8 % — ABNORMAL HIGH (ref 4.8–5.6)

## 2018-10-17 LAB — LIPID PANEL
Chol/HDL Ratio: 3.4 ratio (ref 0.0–4.4)
Cholesterol, Total: 174 mg/dL (ref 100–199)
HDL: 51 mg/dL (ref >39)
LDL Calculated: 92 mg/dL (ref 0–99)
Triglycerides: 153 mg/dL — ABNORMAL HIGH (ref 0–149)
VLDL Cholesterol Cal: 31 mg/dL (ref 5–40)

## 2018-10-17 LAB — MICROALBUMIN / CREATININE URINE RATIO
Creatinine, Urine: 9.1 mg/dL
Microalbumin, Urine: <3 ug/mL

## 2018-10-17 MED ORDER — HYDROCODONE-ACETAMINOPHEN 5-325 MG PO TABS: ORAL_TABLET | ORAL | 0 refills | Status: DC

## 2018-10-17 NOTE — Telephone Encounter (Signed)
Patient requesting refill for HYDROcodone-acetaminophen (NORCO/VICODIN) 5-325 MG tablet   nxt appt: 10/19/18 with Dr.Scott for 4 month follow up  Pharmacy:  [facility], [place]

## 2018-10-17 NOTE — Telephone Encounter (Signed)
Duplicate I already handled this

## 2018-10-17 NOTE — Telephone Encounter (Signed)
This prescription was sent in

## 2018-10-19 ENCOUNTER — Encounter: Payer: Self-pay | Admitting: Family Medicine

## 2018-10-19 ENCOUNTER — Other Ambulatory Visit: Payer: Self-pay

## 2018-10-19 ENCOUNTER — Telehealth: Payer: Self-pay | Admitting: Family Medicine

## 2018-10-19 ENCOUNTER — Ambulatory Visit (INDEPENDENT_AMBULATORY_CARE_PROVIDER_SITE_OTHER): Payer: Medicare Other | Admitting: Family Medicine

## 2018-10-19 VITALS — BP 138/84 | Ht 67.0 in | Wt 232.0 lb

## 2018-10-19 DIAGNOSIS — E039 Hypothyroidism, unspecified: Secondary | ICD-10-CM | POA: Diagnosis not present

## 2018-10-19 DIAGNOSIS — E7849 Other hyperlipidemia: Secondary | ICD-10-CM

## 2018-10-19 DIAGNOSIS — E119 Type 2 diabetes mellitus without complications: Secondary | ICD-10-CM

## 2018-10-19 DIAGNOSIS — I10 Essential (primary) hypertension: Secondary | ICD-10-CM

## 2018-10-19 DIAGNOSIS — G4700 Insomnia, unspecified: Secondary | ICD-10-CM | POA: Diagnosis not present

## 2018-10-19 MED ORDER — HYDROCODONE-ACETAMINOPHEN 5-325 MG PO TABS: ORAL_TABLET | ORAL | 0 refills | Status: DC

## 2018-10-19 NOTE — Telephone Encounter (Signed)
Last labs 10/16/18 lipid, a1c, met 7, microalb. Requesting labs for 3 month followup

## 2018-10-19 NOTE — Telephone Encounter (Signed)
Patient has 3 month follow up 01/19/19 with Dr.Scott, patient requesting blood work. Advise.

## 2018-10-19 NOTE — Progress Notes (Signed)
Patient in today for pain management Takes pain medicine anywhere between 2 and 3 times per day Denies abusing the medication States her overall function does much better when she takes medicine Patient also has diabetes watches a diet as best she can takes her medicine as best she can denies problems  Has thyroid issue takes her medicine every single morning states her energy level overall doing okay tolerates her medicine well Has significant hyperlipidemia Takes Zetia unable to tolerate the pravastatin Morbid obesity tries to watch fats in her diet tries to stay active tries to lose weight very difficult for the patient because of her back and her knees  Has significant insomnia medicine really does not help a whole lot but does help some she feels she cannot function without it  She denies having drowsiness during the night denies any falls or injuries she does use a walker  Her lungs are clear respiratory rate normal heart regular no murmurs extremities no significant edema skin warm dry  The patient was seen in followup for chronic pain. A review over at their current pain status was discussed. Drug registry was checked. Prescriptions were given. Discussion was held regarding the importance of compliance with medication as well as pain medication contract.  Time for questions regarding pain management plan occurred. Importance of regular followup visits was discussed. Patient was informed that medication may cause drowsiness and should not be combined  with other medications/alcohol or street drugs. Patient was cautioned that medication could cause drowsiness. If the patient feels medication is causing altered alertness then do not drive or operate dangerous equipment.  3 scripts were sent in drug registry checked  The patient was seen today as part of a comprehensive visit for diabetes. The importance of keeping her A1c at or below 7 was discussed.  Importance of regular physical  activity was discussed.   The importance of adherence to medication as well as a controlled low starch/sugar diet was also discussed.  Standard follow-up visit recommended.  Also patient aware failure to keep diabetes under control increases the risk of complications. Fair control of the diabetes trying to avoid low sugar spells  HTN- Patient was seen today as part of a visit regarding hypertension. The importance of healthy diet and regular physical activity was discussed. The importance of compliance with medications discussed.  Ideal goal is to keep blood pressure low elevated levels certainly below 825/05 when possible.  The patient was counseled that keeping blood pressure under control lessen his risk of complications.  The importance of regular follow-ups was discussed with the patient.  Low-salt diet such as DASH recommended.  Regular physical activity was recommended as well.  Patient was advised to keep regular follow-ups. Blood pressure under good control continue current measures  Morbid obesity try to lose weight  Thyroid previous labs reviewed very important to take the medicine follow-up if problems  Hyperlipidemia previous labs reviewed continue current measures unable to tolerate statins  The computer system was down when the patient was present so therefore I will look over her labs and send her notation regarding her labs  She will follow-up in approximately 4 months  25 minutes was spent with the patient.  This statement verifies that 25 minutes was indeed spent with the patient.  More than 50% of this visit-total duration of the visit-was spent in counseling and coordination of care. The issues that the patient came in for today as reflected in the diagnosis (s) please refer to documentation  for further details.

## 2018-10-22 NOTE — Telephone Encounter (Signed)
A1c, metabolic 7 

## 2018-10-23 NOTE — Telephone Encounter (Signed)
Orders put in. Pt notified to do in one week before appt in June. Orders mailed to pt as a reminder to do.

## 2018-11-13 ENCOUNTER — Other Ambulatory Visit: Payer: Self-pay

## 2018-11-13 ENCOUNTER — Ambulatory Visit (INDEPENDENT_AMBULATORY_CARE_PROVIDER_SITE_OTHER): Payer: Medicare Other | Admitting: Family Medicine

## 2018-11-13 DIAGNOSIS — G4700 Insomnia, unspecified: Secondary | ICD-10-CM | POA: Diagnosis not present

## 2018-11-13 DIAGNOSIS — M159 Polyosteoarthritis, unspecified: Secondary | ICD-10-CM

## 2018-11-13 DIAGNOSIS — M150 Primary generalized (osteo)arthritis: Secondary | ICD-10-CM

## 2018-11-13 MED ORDER — TIZANIDINE HCL 2 MG PO TABS: ORAL_TABLET | ORAL | 1 refills | Status: DC

## 2018-11-13 NOTE — Progress Notes (Signed)
   Subjective:    Patient ID: Deanna Salinas, female    DOB: [birth year], 83 y.o.   MRN: [id number]  HPI  Patient states she is having significant muscle pain in her legs and it is affecting her sleep. Patient is wondering if she can try a different medication. Video was not possible for this patient Virtual Visit via Video Note  I connected with Deanna Salinas on 11/13/18 at 10:30 AM EDT by a video enabled telemedicine application and verified that I am speaking with the correct person using two identifiers.   I discussed the limitations of evaluation and management by telemedicine and the availability of in person appointments. The patient expressed understanding and agreed to proceed.  History of Present Illness:    Observations/Objective:   Assessment and Plan:   Follow Up Instructions:    I discussed the assessment and treatment plan with the patient. The patient was provided an opportunity to ask questions and all were answered. The patient agreed with the plan and demonstrated an understanding of the instructions.   The patient was advised to call back or seek an in-person evaluation if the symptoms worsen or if the condition fails to improve as anticipated.  I provided 15 minutes of non-face-to-face time during this encounter.    Review of Systems     Objective:   Physical Exam  Telephone visit patient unable to do video      Assessment & Plan:  So currently the patient is not able to get rest because of severe pain in both legs this been going on for a long time getting worse more than likely a spinal stenosis She did not tolerate amitriptyline She takes hydrocodone in the mid evening She uses sleeping pill at night which only gives her about an hour of relief We will try tizanidine taken 1 hour before bedtime caution drowsiness patient will give [place] feedback we will do a follow-up visit in 10 days  Once coronavirus issues settles down consider MRI or CAT  scan of lower back

## 2018-11-23 ENCOUNTER — Telehealth: Payer: Self-pay | Admitting: Family Medicine

## 2018-11-23 NOTE — Telephone Encounter (Signed)
Patient has mailed in reading to [place] but havent arrived at but her reading today at 12:30 was 144 over 89 and her heart rate was 83. Please Advised

## 2018-11-25 NOTE — Telephone Encounter (Signed)
Reasonable for now

## 2018-11-29 ENCOUNTER — Other Ambulatory Visit: Payer: Self-pay | Admitting: *Deleted

## 2018-11-29 ENCOUNTER — Telehealth: Payer: Self-pay | Admitting: Family Medicine

## 2018-11-29 MED ORDER — AMLODIPINE BESYLATE 2.5 MG PO TABS: 2.5000 mg | ORAL_TABLET | Freq: Every day | ORAL | 3 refills | Status: DC

## 2018-11-29 NOTE — Telephone Encounter (Signed)
Please see other message.

## 2018-11-29 NOTE — Telephone Encounter (Signed)
Based upon her readings blood pressure being elevated more likely related to age and mild stiffening of the arteries.  I would recommend low-dose amlodipine 2.5 mg 1 daily, #30, 3 refills I also recommend a virtual follow-up visit in 2 weeks to see how things are going

## 2018-11-29 NOTE — Telephone Encounter (Signed)
Patient blood pressure readings came in today. Put in your folder for review.

## 2018-11-29 NOTE — Telephone Encounter (Signed)
Pt is concerned about BP running high. Pt is reporting her BP has been 140 range over 86 to 90 range. Today's reading is 142/99 it has gotten as high as 169/102.

## 2018-11-29 NOTE — Telephone Encounter (Signed)
Discussed with pt. Pt verbalized understanding. Med sent to pharm. Pt made follow up appt for 2 weeks.

## 2018-11-29 NOTE — Telephone Encounter (Signed)
Left message to return call 

## 2018-12-12 ENCOUNTER — Other Ambulatory Visit: Payer: Self-pay | Admitting: Family Medicine

## 2018-12-14 ENCOUNTER — Ambulatory Visit (INDEPENDENT_AMBULATORY_CARE_PROVIDER_SITE_OTHER): Payer: Medicare Other | Admitting: Family Medicine

## 2018-12-14 ENCOUNTER — Other Ambulatory Visit: Payer: Self-pay

## 2018-12-14 DIAGNOSIS — E039 Hypothyroidism, unspecified: Secondary | ICD-10-CM | POA: Diagnosis not present

## 2018-12-14 DIAGNOSIS — E119 Type 2 diabetes mellitus without complications: Secondary | ICD-10-CM

## 2018-12-14 DIAGNOSIS — I10 Essential (primary) hypertension: Secondary | ICD-10-CM

## 2018-12-14 DIAGNOSIS — M2550 Pain in unspecified joint: Secondary | ICD-10-CM

## 2018-12-14 DIAGNOSIS — M791 Myalgia, unspecified site: Secondary | ICD-10-CM | POA: Diagnosis not present

## 2018-12-14 DIAGNOSIS — G4700 Insomnia, unspecified: Secondary | ICD-10-CM | POA: Diagnosis not present

## 2018-12-14 MED ORDER — HYDROCODONE-ACETAMINOPHEN 5-325 MG PO TABS: ORAL_TABLET | ORAL | 0 refills | Status: DC

## 2018-12-14 NOTE — Progress Notes (Signed)
   Subjective:    Patient ID: Deanna Salinas, female    DOB: 07-31-1936, 83 y.o.   MRN: [id number]  Hypertension  This is a chronic problem. The current episode started more than 1 year ago. Risk factors for coronary artery disease include post-menopausal state. Treatments tried: norvasc, lasix. There are no compliance problems.    Patient having severe issues with insomnia. Patient has not slept in over 30 hours and just moans at night in so much pain in her legs This patient has a lot of body aches pains discomfort we have done lab work in the past week which was not revealing for what was going on we have treated her for her arthralgias in have had to use pain medication she states it helps some she feels like 3/day is not enough for the amount of pain she is having she does suffer with morbid obesity along with degenerative joint disease  She also has longstanding insomnia has been on medicine for years well before I started seeing her and we have discussed sleep hygiene she has not had any formal evaluation for sleep disorder or sleep apnea Virtual Visit via Video Note  I connected with Deanna Salinas on 12/14/18 at 10:00 AM EDT by a video enabled telemedicine application and verified that I am speaking with the correct person using two identifiers.  Location: Patient: home Provider: office   I discussed the limitations of evaluation and management by telemedicine and the availability of in person appointments. The patient expressed understanding and agreed to proceed.  History of Present Illness:    Observations/Objective:   Assessment and Plan:   Follow Up Instructions:    I discussed the assessment and treatment plan with the patient. The patient was provided an opportunity to ask questions and all were answered. The patient agreed with the plan and demonstrated an understanding of the instructions.   The patient was advised to call back or seek an in-person evaluation if  the symptoms worsen or if the condition fails to improve as anticipated.  I provided 16 minutes of non-face-to-face time during this encounter.    Review of Systems     Objective:   Physical Exam        Assessment & Plan:  Subpar pain control We will move the pain medicine to 4 times a day She states it does take the edge off her pain allows her to function better  Severe insomnia despite the sleeping medicine she is been on 3 years.  Certainly medication can lose its effectiveness plus also many elderly people can suffer with sleep related issues I think some of this is pain related so hopefully better pain control will help  We need to make sure that we are not overlooking a possible sleep disorder therefore I recommend a consultation with a sleep specialist in the summertime when their offices are up and running  Myalgias will check lab work await the results  Borderline hypothyroidism patient stopped her levothyroxine because she stated it was causing her problem we will check lab work in June  Blood pressure fair control continue current measures patient will check blood pressures periodically and send [place] results she will follow-up face-to-face in June

## 2018-12-15 NOTE — Progress Notes (Signed)
Orders put in and mailed to pt with a note to do in June and follow up ov late june

## 2018-12-15 NOTE — Addendum Note (Signed)
Addended by: Carmelina Noun on: 12/15/2018 11:02 AM   Modules accepted: Orders

## 2019-01-09 DIAGNOSIS — G4700 Insomnia, unspecified: Secondary | ICD-10-CM | POA: Diagnosis not present

## 2019-01-09 DIAGNOSIS — M791 Myalgia, unspecified site: Secondary | ICD-10-CM | POA: Diagnosis not present

## 2019-01-09 DIAGNOSIS — E119 Type 2 diabetes mellitus without complications: Secondary | ICD-10-CM | POA: Diagnosis not present

## 2019-01-09 DIAGNOSIS — E039 Hypothyroidism, unspecified: Secondary | ICD-10-CM | POA: Diagnosis not present

## 2019-01-09 DIAGNOSIS — M2550 Pain in unspecified joint: Secondary | ICD-10-CM | POA: Diagnosis not present

## 2019-01-09 DIAGNOSIS — I10 Essential (primary) hypertension: Secondary | ICD-10-CM | POA: Diagnosis not present

## 2019-01-10 ENCOUNTER — Encounter: Payer: Self-pay | Admitting: Family Medicine

## 2019-01-10 LAB — BASIC METABOLIC PANEL
BUN/Creatinine Ratio: 26 (ref 12–28)
BUN: 24 mg/dL (ref 8–27)
CO2: 28 mmol/L (ref 20–29)
Calcium: 9.6 mg/dL (ref 8.7–10.3)
Chloride: 98 mmol/L (ref 96–106)
Creatinine, Ser: 0.92 mg/dL (ref 0.57–1.00)
GFR calc Af Amer: 68 mL/min/{1.73_m2} (ref >59)
GFR calc non Af Amer: 59 mL/min/{1.73_m2} — ABNORMAL LOW (ref >59)
Glucose: 128 mg/dL — ABNORMAL HIGH (ref 65–99)
Potassium: 5.1 mmol/L (ref 3.5–5.2)
Sodium: 140 mmol/L (ref 134–144)

## 2019-01-10 LAB — CK: Total CK: 63 U/L (ref 26–161)

## 2019-01-10 LAB — HEMOGLOBIN A1C
Est. average glucose Bld gHb Est-mCnc: 143 mg/dL
Hgb A1c MFr Bld: 6.6 % — ABNORMAL HIGH (ref 4.8–5.6)

## 2019-01-10 LAB — TSH: TSH: 4.05 u[IU]/mL (ref 0.450–4.500)

## 2019-01-17 ENCOUNTER — Other Ambulatory Visit: Payer: Self-pay | Admitting: Family Medicine

## 2019-01-19 ENCOUNTER — Ambulatory Visit: Payer: Medicare Other | Admitting: Family Medicine

## 2019-01-19 NOTE — Telephone Encounter (Signed)
May have 6 refills on quinapril No refills on tizanidine

## 2019-01-29 ENCOUNTER — Encounter: Payer: Self-pay | Admitting: Family Medicine

## 2019-01-31 NOTE — Progress Notes (Deleted)
Office Visit Note  Patient: Deanna Salinas             Date of Birth: 1936-08-08           MRN: [id number]             PCP: Kathyrn Drown, MD Referring: Kathyrn Drown, MD Visit Date: 02/13/2019 Occupation: @GUAROCC @  Subjective:  No chief complaint on file.   History of Present Illness: [name] is a 83 y.o. female ***   Activities of Daily Living:  Patient reports morning stiffness for *** {minute/hour:19697}.   Patient {ACTIONS;DENIES/REPORTS:21021675::"Denies"} nocturnal pain.  Difficulty dressing/grooming: {ACTIONS;DENIES/REPORTS:21021675::"Denies"} Difficulty climbing stairs: {ACTIONS;DENIES/REPORTS:21021675::"Denies"} Difficulty getting out of chair: {ACTIONS;DENIES/REPORTS:21021675::"Denies"} Difficulty using hands for taps, buttons, cutlery, and/or writing: {ACTIONS;DENIES/REPORTS:21021675::"Denies"}  No Rheumatology ROS completed.   PMFS History:  Patient Active Problem List   Diagnosis Date Noted  . Primary osteoarthritis of both hands 01/23/2018  . Primary osteoarthritis of both knees 01/23/2018  . Primary osteoarthritis of both feet 01/23/2018  . Other idiopathic scoliosis, lumbar region 01/23/2018  . DDD (degenerative disc disease), lumbar 01/23/2018  . Hypothyroidism 08/20/2017  . Morbid obesity ([place]) 12/11/2016  . Leukocytosis 06/18/2015  . Type 2 diabetes mellitus ([place]) 06/16/2015  . Abdominal pain, epigastric 02/18/2014  . Elevated lipase 02/18/2014  . Pancreatitis, acute 12/11/2013  . POSTMENOPAUSAL OSTEOPOROSIS 02/17/2009  . Osteoarthritis 03/29/2008  . Insomnia 09/14/2007  . Hyperlipidemia 08/26/2006  . DEPRESSION 08/26/2006  . MIGRAINE HEADACHE 08/26/2006  . CATARACT NOS 08/26/2006  . Essential hypertension 08/26/2006  . SCIATICA 08/26/2006    Past Medical History:  Diagnosis Date  . Essential hypertension   . Fatty liver   . Hyperlipidemia   . Type 2 diabetes mellitus ([facility])     Family History  Problem Relation Age of Onset   . Heart Problems Father   . Hypertension Other   . Hypertension Other   . Cancer Mother   . Cancer Daughter        breast   . Colon cancer Neg Hx    Past Surgical History:  Procedure Laterality Date  . APPENDECTOMY     At the time of hysterectomy  . CESAREAN SECTION     X5  . COLONOSCOPY    . COLONOSCOPY N/A 06/11/2013   Colonic diverticulosis, hyperplastic polyps. no further screening colonoscopies recommended  . ESOPHAGOGASTRODUODENOSCOPY N/A 02/20/2014   Procedure: ESOPHAGOGASTRODUODENOSCOPY (EGD);  Surgeon: Daneil Dolin, MD;  Location: [facility];  Service: Endoscopy;  Laterality: N/A;  11:00   Social History   Social History Narrative  . Not on file   Immunization History  Administered Date(s) Administered  . Influenza Split 05/31/2013  . Influenza Whole 06/02/2006, 06/05/2007, 05/23/2008  . Influenza,inj,Quad PF,6+ Mos 06/16/2015, 04/20/2017, 06/01/2018  . Influenza-Unspecified 05/08/2012, 05/23/2014, 05/19/2016  . Pneumococcal Conjugate-13 09/12/2014  . Pneumococcal Polysaccharide-23 06/08/2012  . Td 02/17/2009  . Zoster 06/16/2003  . Zoster Recombinat (Shingrix) 09/13/2017, 05/11/2018     Objective: Vital Signs: There were no vitals taken for this visit.   Physical Exam   Musculoskeletal Exam: ***  CDAI Exam: CDAI Score: - Patient Global: -; Provider Global: - Swollen: -; Tender: - Joint Exam   No joint exam has been documented for this visit   There is currently no information documented on the homunculus. Go to the Rheumatology activity and complete the homunculus joint exam.  Investigation: No additional findings.  Imaging: No results found.  Recent Labs: Lab Results  Component Value Date  WBC 9.9 04/14/2017   HGB 12.1 (A) 11/17/2017   PLT 267 04/14/2017   NA 140 01/09/2019   K 5.1 01/09/2019   CL 98 01/09/2019   CO2 28 01/09/2019   GLUCOSE 128 (H) 01/09/2019   BUN 24 01/09/2019   CREATININE 0.92 01/09/2019   BILITOT 0.3  11/14/2017   ALKPHOS 82 11/14/2017   AST 18 11/14/2017   ALT 13 11/14/2017   PROT 6.6 11/14/2017   ALBUMIN 4.3 11/14/2017   CALCIUM 9.6 01/09/2019   GFRAA 68 01/09/2019    Speciality Comments: No specialty comments available.  Procedures:  No procedures performed Allergies: Ambien [zolpidem tartrate], Lipitor [atorvastatin], Metformin and related, Pravastatin, and Trazodone and nefazodone   Assessment / Plan:     Visit Diagnoses: No diagnosis found.   Orders: No orders of the defined types were placed in this encounter.  No orders of the defined types were placed in this encounter.   Face-to-face time spent with patient was *** minutes. Greater than 50% of time was spent in counseling and coordination of care.  Follow-Up Instructions: No follow-ups on file.   Earnestine Mealing, CMA  Note - This record has been created using Editor, commissioning.  Chart creation errors have been sought, but may not always  have been located. Such creation errors do not reflect on  the standard of medical care.

## 2019-02-13 ENCOUNTER — Ambulatory Visit: Payer: Self-pay | Admitting: Rheumatology

## 2019-02-13 ENCOUNTER — Telehealth: Payer: Self-pay | Admitting: Family Medicine

## 2019-02-13 ENCOUNTER — Other Ambulatory Visit: Payer: Self-pay | Admitting: Family Medicine

## 2019-02-13 NOTE — Telephone Encounter (Signed)
Patient currently on Eszopiclomidne 3mg  at bedtime

## 2019-02-13 NOTE — Telephone Encounter (Signed)
Patient is requesting a new sleeping pill statng her current sleeping pill not helping. [place] Please advise

## 2019-02-14 NOTE — Telephone Encounter (Signed)
She is already on the top of the line I would recommend that the patient keep a sleep diary regarding when that she lay down?  When does she typically fall asleep?  How long would she sleep for?  When does she end up getting up in the morning?  She should keep this over the course of the next week then send it back to [place] I will review it at that time patient may need referral to a sleep lab for further evaluation

## 2019-02-15 NOTE — Telephone Encounter (Signed)
Left message to return call 

## 2019-02-15 NOTE — Telephone Encounter (Signed)
Pt returned call. Spoke with husband Buege. Pt has been going to sleep at about 8:30 and sleeping in 2 hour intervals. Last night pt took a benadryl and her pain pills at 4 hour intervals. Pt slept for 14 hours last night and feels great this morning. Informed patient husband that patient should keep a sleep diary for about a week and then send to [place]. Pt husband verbalized understanding.

## 2019-02-15 NOTE — Telephone Encounter (Signed)
May refill each x3

## 2019-02-16 ENCOUNTER — Telehealth: Payer: Self-pay | Admitting: Family Medicine

## 2019-02-16 NOTE — Telephone Encounter (Signed)
Patient dropped off letter for you to review in your box.

## 2019-02-16 NOTE — Telephone Encounter (Signed)
Patient called this morning stating husband went to the pharmacy to pick up sleep meds and nothing was there. I told her it was printed and waiting for signature and to be faxed to [facility]. She hasn't sleep in a few days.

## 2019-02-16 NOTE — Telephone Encounter (Signed)
Script printed and will be faxed once signed. Pt verbalized understanding.

## 2019-02-19 ENCOUNTER — Telehealth: Payer: Self-pay | Admitting: Family Medicine

## 2019-02-19 ENCOUNTER — Other Ambulatory Visit: Payer: Self-pay | Admitting: Family Medicine

## 2019-02-19 MED ORDER — TEMAZEPAM 7.5 MG PO CAPS: 7.5000 mg | ORAL_CAPSULE | Freq: Every evening | ORAL | 0 refills | Status: DC | PRN

## 2019-02-19 NOTE — Telephone Encounter (Signed)
Patient notified and scheduled virtual visit this week to address swelling issues and virtual follow up in 2 weeks to discuss sleep.

## 2019-02-19 NOTE — Telephone Encounter (Signed)
Patient currently on Eszopiclone 3 MG TABS

## 2019-02-19 NOTE — Telephone Encounter (Signed)
Pt would like temzaepam 7.5 mg sent to [place], [place].   The other medication is not helping her sleep.   Thursday pt slept 45 minutes  Friday 2.5 hrs  Saturday 5.5 hrs Sunday 3 hrs.   She is also having swelling in leg and ankle.

## 2019-02-19 NOTE — Telephone Encounter (Signed)
Form was reviewed we will stop Lunesta we will try Restoril follow-up within 2 weeks

## 2019-02-19 NOTE — Telephone Encounter (Signed)
I will send in a prescription for temazepam we are discontinuing the other medicine Please let the patient know that sleep medications in older individuals often is not very effective. I would recommend a follow-up visit regarding the sleep issue within 2 weeks this can be virtual or by phone call  As for the swelling I would recommend nurses discuss this with Dr. Richardson Landry I believe the patient will need to be seen this week he can decide whether virtual or in person with him or Hoyle Sauer

## 2019-02-20 ENCOUNTER — Ambulatory Visit: Payer: Medicare Other | Admitting: Family Medicine

## 2019-02-21 ENCOUNTER — Encounter: Payer: Self-pay | Admitting: Nurse Practitioner

## 2019-02-21 ENCOUNTER — Ambulatory Visit (INDEPENDENT_AMBULATORY_CARE_PROVIDER_SITE_OTHER): Payer: Medicare Other | Admitting: Nurse Practitioner

## 2019-02-21 DIAGNOSIS — F5104 Psychophysiologic insomnia: Secondary | ICD-10-CM

## 2019-02-21 NOTE — Progress Notes (Signed)
   Subjective:  PHONE VISIT  Patient ID: Deanna Salinas, female    DOB: 09-26-1935, 83 y.o.   MRN: [id number]  HPI Discuss chronic insomnia  Virtual Visit via Video Note  I connected with Deanna Salinas on 02/21/19 at 11:20 AM EDT by a video enabled telemedicine application and verified that I am speaking with the correct person using two identifiers.  Location: Patient: home Provider: office   I discussed the limitations of evaluation and management by telemedicine and the availability of in person appointments. The patient expressed understanding and agreed to proceed. Unable to connect with video through her email  History of Present Illness: Presents to discuss her chronic insomnia. Has tried multiple medications with minimal or side effects. Averages 2-3 hours per sleep per night, with max 5 1/2 at most (after taking Benadryl). Started on Temazepam 2 days ago. No improvement. No caffeine. No excessive stress. Practices good sleep hygiene including mattress and pillow. Does not nap during the day.    Observations/Objective: Today's visit was via telephone Physical exam was not possible for this visit Alert, oriented. Calm, cheerful affect. Thoughts logical, coherent and relevant.   Assessment and Plan: Problem List Items Addressed This Visit      Other   Chronic insomnia - Primary       Follow Up Instructions: Increase Temazepam to 2 tabs (15 mg) before bed for the next 2 nights. Call back Friday to discuss results. If no improvement, consider increasing dose or switch to Hydroxyzine.  Understands risks with dizziness and falls with sleep agents. Use with caution.    I discussed the assessment and treatment plan with the patient. The patient was provided an opportunity to ask questions and all were answered. The patient agreed with the plan and demonstrated an understanding of the instructions.   The patient was advised to call back or seek an in-person evaluation if the  symptoms worsen or if the condition fails to improve as anticipated.  I provided 15 minutes of non-face-to-face time during this encounter.      Review of Systems     Objective:   Physical Exam        Assessment & Plan:

## 2019-02-23 ENCOUNTER — Other Ambulatory Visit: Payer: Self-pay | Admitting: Nurse Practitioner

## 2019-02-23 ENCOUNTER — Telehealth: Payer: Self-pay | Admitting: Family Medicine

## 2019-02-23 MED ORDER — TEMAZEPAM 15 MG PO CAPS: 15.0000 mg | ORAL_CAPSULE | Freq: Every evening | ORAL | 0 refills | Status: DC | PRN

## 2019-02-23 NOTE — Telephone Encounter (Signed)
Patient notified

## 2019-02-23 NOTE — Telephone Encounter (Signed)
Message for Carolyn-Patient states what you prescribe she is doing well with it.

## 2019-02-23 NOTE — Telephone Encounter (Signed)
I will send in Rx for new dose. Thanks.

## 2019-02-28 ENCOUNTER — Telehealth: Payer: Self-pay | Admitting: Family Medicine

## 2019-02-28 NOTE — Telephone Encounter (Signed)
Please advise. Thank you

## 2019-02-28 NOTE — Telephone Encounter (Signed)
Patient is requesting refill on temazepan 30 mg but Hoyle Sauer put her on 15 mg ,taking 2 pills as need but she has taken them every night and only has 4 pills left and wants the dosage increased. Please review visit from 7/15. She states needs her medication to sleep and wanting the pharmacy to refill without problems for you to give the ok for her to get medication early. Please advise

## 2019-03-01 NOTE — Telephone Encounter (Signed)
Please clarify. Scott put her on 7.5 mg initially. When I spoke with her during her virtual visit, we increased it to 2 tablets which is 15 mg. I sent in a new Rx with this dosage so she would not run out. What dose is she taking now? When did she start this? Thanks.

## 2019-03-02 ENCOUNTER — Other Ambulatory Visit: Payer: Self-pay | Admitting: Family Medicine

## 2019-03-02 MED ORDER — HYDROXYZINE PAMOATE 25 MG PO CAPS: ORAL_CAPSULE | ORAL | 3 refills | Status: DC

## 2019-03-02 NOTE — Telephone Encounter (Signed)
Certainly I am sympathetic to what is going on with the patient  But at the same time I am obligated to stay within safe parameters Given that the sleep medicine is not doing much at all I would recommend stopping temazepam  Also with her being on the hydrocodone 4 times daily plus also her age I do not want to start any type of hypnotics for sleep I recommend trying Vistaril 25 mg, may take 1 to 2 tablets each evening to help with sleep, #60, 3 refills  Also patient was referred back in May to specialist for insomnia did she ever see Dr Merlene Laughter or any other specialist for insomnia?  I believe the patient would benefit from being seen and evaluated for a sleep disturbance and to have a sleep study

## 2019-03-02 NOTE — Telephone Encounter (Signed)
Pt would like to be seen virtual

## 2019-03-02 NOTE — Telephone Encounter (Signed)
So I understand her situation we can hold off on the consultation for now patient should give [place] an update in approximately 2 weeks on how things are going It would be wise to schedule a phone visit with her in approximately 2 weeks

## 2019-03-02 NOTE — Telephone Encounter (Signed)
Pt has appt on 03/08/2019; do we need to cancel that and schedule an appt for 2 weeks out? Please advise. Thank you

## 2019-03-02 NOTE — Telephone Encounter (Signed)
Called Deanna Salinas and she last filled temazepam 7.5mg  one at [facility] on 7[facility]13. And then they received a script on 7/17 for 15mg  but unable to fill since she just filled 4 days earlier. They told her to just take 2 of the 7.5mg  and she will due for the 15mg  script tomorrow.  Called pt she states she will out of med Saturday and she is taking 2 q[facility] and it is not helping. She is still only getting 2 - 3 hours a sleep at night. She states she understands meds not safe for seniors but she states she does not get up at night without her walker and she thinks not sleeping will cause her more heatlh problems. She takes hyrdocodone 5/325 every 6 hours. Takes dose at 8:30 at night and then one hour later takes her sleep med. Goes to sleep at 10 and wakes up around 12 and not able to go back to sleep. Has appt 7/30 with dr Nicki Reaper.

## 2019-03-02 NOTE — Telephone Encounter (Signed)
Pt contacted and verbalized understanding. Sent in Vistaril 25 mg to [facility]. Pt states she has failed 2 of the sleep test she has had done before. Pt states she is unable to sleep when they hook the wires up to her. Upon doing some research in her chart; pt had a sleep test 10/21/7. Unable to do another sleep test unless it has been over 3 years per insurance. Pt state she does not want to go out anywhere until this virus is under control

## 2019-03-02 NOTE — Telephone Encounter (Signed)
So I am concerned that this patient is escalating her medicines beyond what is safe  I would like the following before any decisions are made #1- confirm with her pharmacy that she was prescribed 15 mg #2 talk with the patient how is she currently taking her medicine in regards to the sleep medicine?  #3 talk with the patient find out when is she taking her pain medicines?  Explain to the patient that the combinations of these medications can pose safety concerns.  After I received the above information I will make further recommendations.  Certainly we want to help the patient but we also want to be safe given her age

## 2019-03-02 NOTE — Telephone Encounter (Signed)
She may keep the appointment on the 30th.  Talk with the patient if she would like for this to be in person we can work with that as long as she is not sick and as long as she is willing to wear a mask-it is her choice whether to be virtual or in person

## 2019-03-08 ENCOUNTER — Other Ambulatory Visit: Payer: Self-pay

## 2019-03-08 ENCOUNTER — Ambulatory Visit (INDEPENDENT_AMBULATORY_CARE_PROVIDER_SITE_OTHER): Payer: Medicare Other | Admitting: Family Medicine

## 2019-03-08 DIAGNOSIS — F5104 Psychophysiologic insomnia: Secondary | ICD-10-CM

## 2019-03-08 NOTE — Progress Notes (Signed)
Subjective:    Patient ID: Deanna Salinas, female    DOB: 09-29-1935, 83 y.o.   MRN: [id number]  HPI Pt needing to discuss sleep medication. Pt was on Restoril and was taking 2 tablets at bedtime every night. Provider changed to Vistaril 25 mg. Typical night/day she has: 3hours of sleep from Wednesday 12am took pain pill and took aleve pm due to leg/muscle cramps. Stayed awake until 2 am; took 30 mg of Temazepam. Pt rubbed CBD oil on legs. Walked around to get cramps out: 4:12am-730am she slept. Pt took a pain pill at 7:30. She is scheduled for a sleep study on Aug 12. Has a dark room and does everything that she needs to to be able to sleep. Pt states she is exhausted. Pt states the Vistaril is not working either. Pt is needing refills on Amlodipine.   Virtual Visit via Video Note  I connected with Deanna Salinas on 03/08/19 at 10:30 AM EDT by a video enabled telemedicine application and verified that I am speaking with the correct person using two identifiers.  Location: Patient: home Provider: office   I discussed the limitations of evaluation and management by telemedicine and the availability of in person appointments. The patient expressed understanding and agreed to proceed.  History of Present Illness:    Observations/Objective:   Assessment and Plan:   Follow Up Instructions:    I discussed the assessment and treatment plan with the patient. The patient was provided an opportunity to ask questions and all were answered. The patient agreed with the plan and demonstrated an understanding of the instructions.   The patient was advised to call back or seek an in-person evaluation if the symptoms worsen or if the condition fails to improve as anticipated.  I provided 15 minutes of non-face-to-face time during this encounter.   Vicente Males, LPN   Patient also has significant arthralgias and myalgias related to fibromyalgia and arthritis  Review of Systems   Constitutional: Negative for activity change and appetite change.  HENT: Negative for congestion and rhinorrhea.   Respiratory: Negative for cough and shortness of breath.   Cardiovascular: Negative for chest pain and leg swelling.  Gastrointestinal: Negative for abdominal pain, nausea and vomiting.  Musculoskeletal: Positive for arthralgias and back pain.  Skin: Negative for color change.  Neurological: Negative for dizziness and weakness.  Psychiatric/Behavioral: Negative for agitation and confusion.       Objective:   Physical Exam Vitals signs reviewed.  Constitutional:      Appearance: She is well-developed.  HENT:     Head: Normocephalic.  Cardiovascular:     Rate and Rhythm: Normal rate and regular rhythm.     Heart sounds: Normal heart sounds. No murmur.  Pulmonary:     Effort: Pulmonary effort is normal.     Breath sounds: Normal breath sounds.  Skin:    General: Skin is warm and dry.  Neurological:     Mental Status: She is alert.           Assessment & Plan:  Severe insomnia Patient has unfortunately significant problems with insomnia she has had this for years.  Previously she had tried trazodone but did not tolerate side effects She is tried over-the-counter measures without success Recently over the past few years she used [place] but it did not help her She did try temazepam which seemed to help some but patient having significant chronic pain and discomfort in her joints for which it was  necessary to use hydrocodone  Because of this I do not recommend opioids with sleep medicines.  We are referring her for sleep study and evaluation hopefully there is other measures that could be tried to help her  I do not feel good about having her take sleep hypnotics with opioid medications  She has tried Vistaril without effect  We will send a copy of this dictation to the specialist for their review patient will be having sleep study and consultation coming

## 2019-03-30 ENCOUNTER — Telehealth: Payer: Self-pay | Admitting: Family Medicine

## 2019-03-30 ENCOUNTER — Other Ambulatory Visit: Payer: Self-pay | Admitting: Family Medicine

## 2019-03-30 MED ORDER — HYDROCODONE-ACETAMINOPHEN 5-325 MG PO TABS: 1.0000 | ORAL_TABLET | Freq: Three times a day (TID) | ORAL | Status: DC | PRN

## 2019-03-30 NOTE — Telephone Encounter (Signed)
Patient advised per Dr Nicki Reaper:  the difficult part of this patient's current care is she is on a very strong dose of a sleeping medicine at nighttime The state medical board strongly frowns on prescribing this medicine along with frequent pain medicine She needs to understand it is very difficult to balance her needs for pain control as well as balance safety If she would like to do a follow-up phone discussion we can certainly schedule that It is not that I do not care it is because Dr Nicki Reaper is trying to be safe  Patient verbalized understating.

## 2019-03-30 NOTE — Telephone Encounter (Signed)
Pt wants to know why her instructions on her pain medication has changed. She states she use to take it every 6 hours and now shes suppose to take it every 8 hours. She states that's a big difference for her.

## 2019-03-30 NOTE — Telephone Encounter (Signed)
So the difficult part of this patient's current care is she is on a very strong dose of a sleeping medicine at nighttime The state medical board strongly frowns on prescribing this medicine along with frequent pain medicine She needs to understand it is very difficult to balance her needs for pain control as well as balance safety If she would like to do a follow-up phone discussion we can certainly schedule that It is not that I do not care it is because I am trying to be safe

## 2019-04-04 ENCOUNTER — Ambulatory Visit (INDEPENDENT_AMBULATORY_CARE_PROVIDER_SITE_OTHER): Payer: Medicare Other | Admitting: Family Medicine

## 2019-04-04 ENCOUNTER — Other Ambulatory Visit: Payer: Self-pay

## 2019-04-04 ENCOUNTER — Telehealth: Payer: Self-pay | Admitting: Family Medicine

## 2019-04-04 ENCOUNTER — Encounter: Payer: Self-pay | Admitting: Family Medicine

## 2019-04-04 DIAGNOSIS — M5431 Sciatica, right side: Secondary | ICD-10-CM

## 2019-04-04 DIAGNOSIS — F5104 Psychophysiologic insomnia: Secondary | ICD-10-CM

## 2019-04-04 DIAGNOSIS — M5432 Sciatica, left side: Secondary | ICD-10-CM | POA: Diagnosis not present

## 2019-04-04 NOTE — Progress Notes (Signed)
Subjective:    Patient ID: Deanna Salinas, female    DOB: Jan 11, 1936, 83 y.o.   MRN: [id number]  Back Pain This is a chronic problem. Episode onset: one year. The pain is present in the lumbar spine. Radiates to: both legs. getting hard to walk. Pertinent negatives include no abdominal pain, chest pain or weakness. She has tried ice (hydrocodone 5/325) for the symptoms.  not sleeping due to pain.  Significant back pain radiates into both legs worse on the left side down the left leg this been going on for months we have been treating it with hydrocodone and stretching because she was not able to get adequate relief with Tylenol and ibuprofen she denies drowsiness with the medicine  Insomnia has severe insomnia issues she had been on various medicine for years even when she started coming to me but she relates those medicines have not been helpful she states sometimes she can stay awake for up to 30 hours.  She tried to do a sleep study back in 2017 unable to do so she is hoping to be able to do a sleep study in the near future she also relates just difficult time sleeping for most of her adult years she states her current medicine is not helping her I encouraged her to stop her current medicine stop temazepam Virtual Visit via Telephone Note  I connected with Deanna Salinas on 04/04/19 at  2:00 PM EDT by telephone and verified that I am speaking with the correct person using two identifiers.  Location: Patient: home Provider: office  I discussed the limitations, risks, security and privacy concerns of performing an evaluation and management service by telephone and the availability of in person appointments. I also discussed with the patient that there may be a patient responsible charge related to this service. The patient expressed understanding and agreed to proceed.   History of Present Illness:    Observations/Objective:   Assessment and Plan:   Follow Up Instructions:    I  discussed the assessment and treatment plan with the patient. The patient was provided an opportunity to ask questions and all were answered. The patient agreed with the plan and demonstrated an understanding of the instructions.   The patient was advised to call back or seek an in-person evaluation if the symptoms worsen or if the condition fails to improve as anticipated.  I provided 30 minutes of non-face-to-face time during this encounter.     Review of Systems  Constitutional: Negative for activity change, appetite change and fatigue.  HENT: Negative for congestion and rhinorrhea.   Respiratory: Negative for cough and shortness of breath.   Cardiovascular: Negative for chest pain and leg swelling.  Gastrointestinal: Negative for abdominal pain and diarrhea.  Endocrine: Negative for polydipsia and polyphagia.  Musculoskeletal: Positive for back pain.  Skin: Negative for color change.  Neurological: Negative for dizziness and weakness.  Psychiatric/Behavioral: Negative for behavioral problems and confusion.  Positive sciatica down the left leg some on the right leg Severe insomnia     Objective:   Physical Exam  Today's visit was via telephone Physical exam was not possible for this visit       Assessment & Plan:  Severe insomnia stop temazepam is not helping her is also risky medicine for her age She needs to see neurology to have a sleep study Possibly needing a CPAP machine I am hesitant to use any type of sleeping medicine such as Lunesta Ambien and temazepam etc.  because she is on opioids  Low back pain with sciatica worse on the left than the right currently on hydrocodone 3 times daily we will do lumbar sacral x-rays then the next step would be is to set her up for an MRI of the lumbar spine possible spinal stenosis she certainly has arthritic changes  30 minutes spent with this patient greater than half in discussion of multiple issues

## 2019-04-04 NOTE — Telephone Encounter (Signed)
I did do a dictation of a letter to Dr.[place] Please print this letter and fax it to him please thank you she will be seeing him in the near future  Thank you

## 2019-04-04 NOTE — Telephone Encounter (Signed)
Patient wanted [place] to order a MRI for back pain.  I told patient she would need a virtual visit to discuss that with the provider.  Patient scheduled for this afternoon.

## 2019-04-06 ENCOUNTER — Ambulatory Visit (HOSPITAL_COMMUNITY)
Admission: RE | Admit: 2019-04-06 | Discharge: 2019-04-06 | Disposition: A | Discharge status: Home / Self Care | Payer: Medicare Other | Source: Ambulatory Visit | Attending: Family Medicine | Admitting: Family Medicine

## 2019-04-06 ENCOUNTER — Other Ambulatory Visit: Payer: Self-pay

## 2019-04-06 DIAGNOSIS — M5431 Sciatica, right side: Secondary | ICD-10-CM | POA: Diagnosis not present

## 2019-04-06 DIAGNOSIS — M545 Low back pain: Secondary | ICD-10-CM | POA: Diagnosis not present

## 2019-04-06 DIAGNOSIS — M5432 Sciatica, left side: Secondary | ICD-10-CM | POA: Diagnosis not present

## 2019-04-06 NOTE — Addendum Note (Signed)
Addended by: Vicente Males on: 04/06/2019 03:12 PM   Modules accepted: Orders

## 2019-04-17 ENCOUNTER — Ambulatory Visit (HOSPITAL_COMMUNITY): Payer: Medicare Other

## 2019-04-19 ENCOUNTER — Other Ambulatory Visit: Payer: Self-pay | Admitting: Family Medicine

## 2019-04-20 ENCOUNTER — Ambulatory Visit (HOSPITAL_COMMUNITY): Admission: RE | Admit: 2019-04-20 | Payer: Medicare Other | Source: Ambulatory Visit

## 2019-04-24 ENCOUNTER — Other Ambulatory Visit (INDEPENDENT_AMBULATORY_CARE_PROVIDER_SITE_OTHER): Payer: Medicare Other | Admitting: *Deleted

## 2019-04-24 ENCOUNTER — Ambulatory Visit (HOSPITAL_COMMUNITY)
Admission: RE | Admit: 2019-04-24 | Discharge: 2019-04-24 | Disposition: A | Discharge status: Home / Self Care | Payer: Medicare Other | Source: Ambulatory Visit | Attending: Family Medicine | Admitting: Family Medicine

## 2019-04-24 ENCOUNTER — Other Ambulatory Visit: Payer: Self-pay

## 2019-04-24 DIAGNOSIS — M545 Low back pain: Secondary | ICD-10-CM | POA: Diagnosis not present

## 2019-04-24 DIAGNOSIS — Z23 Encounter for immunization: Secondary | ICD-10-CM | POA: Diagnosis not present

## 2019-04-24 DIAGNOSIS — M5431 Sciatica, right side: Secondary | ICD-10-CM | POA: Diagnosis not present

## 2019-04-24 DIAGNOSIS — M5432 Sciatica, left side: Secondary | ICD-10-CM | POA: Diagnosis not present

## 2019-04-25 ENCOUNTER — Ambulatory Visit (INDEPENDENT_AMBULATORY_CARE_PROVIDER_SITE_OTHER): Payer: Medicare Other | Admitting: Family Medicine

## 2019-04-25 DIAGNOSIS — M48062 Spinal stenosis, lumbar region with neurogenic claudication: Secondary | ICD-10-CM

## 2019-04-25 DIAGNOSIS — M5136 Other intervertebral disc degeneration, lumbar region: Secondary | ICD-10-CM | POA: Diagnosis not present

## 2019-04-25 NOTE — Progress Notes (Signed)
   Subjective:    Patient ID: Deanna Salinas, female    DOB: 08/13/1935, 83 y.o.   MRN: [id number] Phone consultation video not possible for family HPIgo over MRI results.   Pt states she did not stay for appt with neurology after people in the waiting room looked like they were on hard drugs and not wearing masks. She left before seeing the doctor and said this was not a place for her.  I talked with the patient in length.  She states neurology office scared her she did not want to go back She does relate how her back is still giving her a lot of problems with stiffness pain discomfort and the pain radiates into the legs when she does walking she can only walk a short distance without having to stop because of this  She had a recent MRI which showed significant facet arthropathy but also showed a large amount of spinal stenosis issues and degenerative disc disease Virtual Visit via Telephone Note  I connected with Deanna Salinas on 04/25/19 at  4:10 PM EDT by telephone and verified that I am speaking with the correct person using two identifiers.  Location: Patient: home Provider: office   I discussed the limitations, risks, security and privacy concerns of performing an evaluation and management service by telephone and the availability of in person appointments. I also discussed with the patient that there may be a patient responsible charge related to this service. The patient expressed understanding and agreed to proceed.   History of Present Illness:    Observations/Objective:   Assessment and Plan:   Follow Up Instructions:    I discussed the assessment and treatment plan with the patient. The patient was provided an opportunity to ask questions and all were answered. The patient agreed with the plan and demonstrated an understanding of the instructions.   The patient was advised to call back or seek an in-person evaluation if the symptoms worsen or if the condition fails  to improve as anticipated.  I provided 20 minutes of non-face-to-face time during this encounter.      Review of Systems  Constitutional: Negative for activity change and appetite change.  HENT: Negative for congestion and rhinorrhea.   Respiratory: Negative for cough and shortness of breath.   Cardiovascular: Negative for chest pain and leg swelling.  Gastrointestinal: Negative for abdominal pain, nausea and vomiting.  Musculoskeletal: Positive for arthralgias and back pain.  Skin: Negative for color change.  Neurological: Negative for dizziness and weakness.  Psychiatric/Behavioral: Negative for agitation and confusion.       Objective:   Physical Exam  Today's visit was via telephone Physical exam was not possible for this visit       Assessment & Plan:  Spinal stenosis I recommend starting physical therapy to see if this improves things She may continue hydrocodone no more than 3 times per day Referral to neurosurgery for their opinion Hopefully they could potentially try injections.  Although surgery could be done I hope this would be a last resort.  We will do a follow-up with patient in 3 weeks

## 2019-04-26 ENCOUNTER — Other Ambulatory Visit: Payer: Self-pay | Admitting: *Deleted

## 2019-04-26 DIAGNOSIS — M4800 Spinal stenosis, site unspecified: Secondary | ICD-10-CM

## 2019-04-26 NOTE — Progress Notes (Signed)
Referrals put in   

## 2019-04-27 ENCOUNTER — Encounter: Payer: Self-pay | Admitting: Family Medicine

## 2019-05-04 ENCOUNTER — Ambulatory Visit (HOSPITAL_COMMUNITY): Payer: Medicare Other | Attending: Family Medicine | Admitting: Physical Therapy

## 2019-05-04 ENCOUNTER — Encounter (HOSPITAL_COMMUNITY): Payer: Self-pay | Admitting: Physical Therapy

## 2019-05-04 ENCOUNTER — Other Ambulatory Visit: Payer: Self-pay

## 2019-05-04 DIAGNOSIS — M6281 Muscle weakness (generalized): Secondary | ICD-10-CM | POA: Insufficient documentation

## 2019-05-04 DIAGNOSIS — G8929 Other chronic pain: Secondary | ICD-10-CM

## 2019-05-04 DIAGNOSIS — M5440 Lumbago with sciatica, unspecified side: Secondary | ICD-10-CM | POA: Diagnosis not present

## 2019-05-04 DIAGNOSIS — R29898 Other symptoms and signs involving the musculoskeletal system: Secondary | ICD-10-CM

## 2019-05-04 DIAGNOSIS — R293 Abnormal posture: Secondary | ICD-10-CM | POA: Diagnosis not present

## 2019-05-04 NOTE — Therapy (Signed)
[place], [place], [address] Phone: [phone]   Fax:  [phone]  Physical Therapy Evaluation  Patient Details  Name: Deanna Salinas MRN: [id number] Date of Birth: 08/21/1935 Referring Provider (PT): Sallee Lange, MD   Encounter Date: 05/04/2019  PT End of Session - 05/04/19 1808    Visit Number  1    Number of Visits  8    Date for PT Re-Evaluation  06/01/19    Authorization Type  Medicare; Secondary: BCBS Supplement    Authorization Time Period  05/04/19 - 06/01/19    Authorization - Visit Number  1    Authorization - Number of Visits  10    PT Start Time  [id number]    PT Stop Time  1430    PT Time Calculation (min)  35 min    Activity Tolerance  Patient tolerated treatment well    Behavior During Therapy  [facility] for tasks assessed/performed       Past Medical History:  Diagnosis Date  . Essential hypertension   . Fatty liver   . Hyperlipidemia   . Type 2 diabetes mellitus ([place])     Past Surgical History:  Procedure Laterality Date  . APPENDECTOMY     At the time of hysterectomy  . CESAREAN SECTION     X5  . COLONOSCOPY    . COLONOSCOPY N/A 06/11/2013   Colonic diverticulosis, hyperplastic polyps. no further screening colonoscopies recommended  . ESOPHAGOGASTRODUODENOSCOPY N/A 02/20/2014   Procedure: ESOPHAGOGASTRODUODENOSCOPY (EGD);  Surgeon: Daneil Dolin, MD;  [facility];  Service: Endoscopy;  Laterality: N/A;  11:00    There were no vitals filed for this visit.   Subjective Assessment - 05/04/19 1404    Subjective  Patient reported that she has had lower back pain for the past 2-3 years. Patient reported that the pain in her lower back has been radiating to her buttocks. She uses a 4 wheeled walker. Patient has been using the walker for 1 year. Patient reported bone on bone arthritis in both knees. She said that her back pain radiates down both legs in the left more than the right.  Patient reported urinary incontinence which began 6-7 months ago before her MRI and reported that her Dr. is aware of this urinary problem. Patient reported that pain will wake her up at times and she'll get up and walk. She reported a maximum of 10/10 pain. Patient is very [place] and is waiting for hearing aids, her husband was present during the evaluation helping to communicate with the patient.    Patient is accompained by:  Family member   Husband   Limitations  Standing;Walking;House hold activities    How long can you stand comfortably?  15 minutes    How long can you walk comfortably?  Can walk through [facility] with walker    Diagnostic tests  MRI, X-ray    Patient Stated Goals  Reduced pain    Currently in Pain?  No/denies         [facility] PT Assessment - 05/04/19 0001      Assessment   Medical Diagnosis  Spinal Stenosis    Referring Provider (PT)  Sallee Lange, MD    Onset Date/Surgical Date  --   Several years   Next MD Visit  unknown      Prior Function   Level of Independence  Independent;Independent with basic ADLs      Cognition  Overall Cognitive Status  Within Functional Limits for tasks assessed      Observation/Other Assessments   Focus on Therapeutic Outcomes (FOTO)   Perform next session      Posture/Postural Control   Posture/Postural Control  Postural limitations    Postural Limitations  Flexed trunk;Rounded Shoulders;Forward head      ROM / Strength   AROM / PROM / Strength  Strength;PROM      PROM   Overall PROM Comments  Hip ROM minimally limited bilaterally in IR/ER no increased pain in hips. Some pain in knees as well as crepitus noted with knee flexion to attain position.       Strength   Strength Assessment Site  Hip;Knee;Ankle    Right/Left Hip  Right;Left    Right Hip Flexion  4-/5    Right Hip Extension  4-/5    Right Hip ABduction  4-/5    Left Hip Flexion  4-/5    Left Hip Extension  4-/5    Left Hip ABduction  4-/5    Right/Left Knee   Right;Left    Right Knee Flexion  5/5    Right Knee Extension  5/5    Left Knee Flexion  5/5    Left Knee Extension  5/5    Right/Left Ankle  Right;Left    Right Ankle Dorsiflexion  5/5    Left Ankle Dorsiflexion  5/5      Flexibility   Soft Tissue Assessment /Muscle Length  yes    Hamstrings  WFL      Palpation   Spinal mobility  General hypmobility, increased pain with PA of lumbar spine    Palpation comment  Tenderness to palpation through bilateral gluteals with muscular restrictions felt throughout LT > RT                Objective measurements completed on examination: See above findings.              PT Education - 05/04/19 1806    Education Details  Educated on examination findings, POC, and initial HEP.    Person(s) Educated  Patient;Spouse    Methods  Explanation;Handout    Comprehension  Verbalized understanding       PT Short Term Goals - 05/04/19 1811      PT SHORT TERM GOAL #1   Title  Patient will report understanding and regular compliance with HEP to improve strength, decrease pain and improve overall functional mobility.    Time  2    Period  Weeks    Target Date  05/18/19        PT Long Term Goals - 05/04/19 1812      PT LONG TERM GOAL #1   Title  Patient will report that her symptoms have improve by at least 50% in order to improve overall tolerance to daily activities and improve QOL.    Time  4    Period  Weeks    Status  New    Target Date  06/01/19      PT LONG TERM GOAL #2   Title  Patient will demonstrate improvement of at least 1 MMT strength grade in order to improve mechanics with functional mobility and decrease forces on back.    Time  4    Period  Weeks    Status  New    Target Date  06/01/19      PT LONG TERM GOAL #3   Title  Patient will report ability  to stand for at least 30 minutes in order to perform household chores such as doing dishes more independently.    Time  4    Period  Weeks    Status  New     Target Date  06/01/19      PT LONG TERM GOAL #4   Title  Patient will demonstrate improved upright posture with ambulation to improve safety.    Time  4    Period  Weeks    Status  New    Target Date  06/01/19             Plan - 05/04/19 1825    Clinical Impression Statement  Patient is an 83 year old female who presented to outpatient physical therapy with primary complaint of several years of low back pain. Upon examination, noted significant postural dysfunction with forward flexed trunk with ambulation using RW to decrease painful symptoms. Noted decreased lower extremity strength particularly in patient's hips. Patient's hip IR/ER PROM was minimally limited, but did not contribute or increase patient's pain. Patient did report an increase in painful symptoms with PA spinal mobility testing of lumbar spine. Noted hypomobility through spine as well as increased muscular restrictions in bilateral gluteals left more than right as well as provocation of patient's pain. Educated patient and patient's husband on initial HEP to perform daily and for pain flare-ups. Patient did report incontinence issues, however, she stated these have been ongoing since before she had her MRI and that her MD is aware, but will continue to monitor for any possible non-musculoskeletal cause of patient's pain. Patient would benefit from skilled physical therapy in order to address the abovementioned deficits and help patient return to her prior level of function.    Personal Factors and Comorbidities  Age;Time since onset of injury/illness/exacerbation;Comorbidity 3+    Comorbidities  HTN, DMII, OA bilateral knees    Examination-Activity Limitations  Squat;Bed Mobility;Stairs;Bend;Locomotion Level;Stand;Transfers    Examination-Participation Restrictions  Shop;Cleaning;Community Activity    Stability/Clinical Decision Making  Evolving/Moderate complexity    Clinical Decision Making  Moderate    Rehab Potential   Fair    PT Frequency  2x / week    PT Duration  4 weeks    PT Treatment/Interventions  ADLs/Self Care Home Management;Aquatic Therapy;Cryotherapy;Electrical Stimulation;Iontophoresis 4mg /ml Dexamethasone;Moist Heat;Traction;DME Instruction;Gait training;Stair training;Functional mobility training;Therapeutic activities;Therapeutic exercise;Balance training;Neuromuscular re-education;Patient/family education;Manual techniques;Passive range of motion;Dry needling;Energy conservation;Taping    PT Next Visit Plan  Review Eval/goals/initial HEP. Perform FOTO. Perform 2MWT. Focus on lumbar mobility improving posture. STM gluteals to decrease restrictions and pain relief.    PT Home Exercise Plan  05/04/19: DKTC    Consulted and Agree with Plan of Care  Patient;Family member/caregiver    Family Member Consulted  Husband       Patient will benefit from skilled therapeutic intervention in order to improve the following deficits and impairments:  Abnormal gait, Increased fascial restricitons, Improper body mechanics, Pain, Decreased mobility, Postural dysfunction, Decreased activity tolerance, Decreased endurance, Decreased range of motion, Decreased strength, Hypomobility, Difficulty walking, Obesity  Visit Diagnosis: Chronic bilateral low back pain with sciatica, sciatica laterality unspecified  Muscle weakness (generalized)  Other symptoms and signs involving the musculoskeletal system  Abnormal posture     Problem List Patient Active Problem List   Diagnosis Date Noted  . Primary osteoarthritis of both hands 01/23/2018  . Primary osteoarthritis of both knees 01/23/2018  . Primary osteoarthritis of both feet 01/23/2018  . Other idiopathic scoliosis, lumbar region 01/23/2018  .  DDD (degenerative disc disease), lumbar 01/23/2018  . Hypothyroidism 08/20/2017  . Morbid obesity ([place]) 12/11/2016  . Leukocytosis 06/18/2015  . Type 2 diabetes mellitus ([place]) 06/16/2015  . Abdominal pain,  epigastric 02/18/2014  . Elevated lipase 02/18/2014  . Pancreatitis, acute 12/11/2013  . POSTMENOPAUSAL OSTEOPOROSIS 02/17/2009  . Osteoarthritis 03/29/2008  . Chronic insomnia 09/14/2007  . Hyperlipidemia 08/26/2006  . DEPRESSION 08/26/2006  . MIGRAINE HEADACHE 08/26/2006  . CATARACT NOS 08/26/2006  . Essential hypertension 08/26/2006  . SCIATICA 08/26/2006   Clarene Critchley PT, DPT 6:33 PM, 05/04/19 [place] [address] [place], [place], [address] Phone: [phone]   Fax:  [phone]  Name: Deanna Salinas MRN: [id number] Date of Birth: [birth year]

## 2019-05-04 NOTE — Patient Instructions (Signed)
Double Knee to Chest (Flexion)    Gently pull both knees toward chest. Feel stretch in lower back or buttock area. Breathing deeply, Hold _20___ seconds. Repeat __3__ times. Do __1-2__ sessions per day. Can use a sheet behind.   http://gt2.exer.us/228   Copyright  VHI. All rights reserved.

## 2019-05-08 ENCOUNTER — Other Ambulatory Visit: Payer: Self-pay

## 2019-05-08 ENCOUNTER — Encounter (HOSPITAL_COMMUNITY): Payer: Self-pay | Admitting: Physical Therapy

## 2019-05-08 ENCOUNTER — Ambulatory Visit (HOSPITAL_COMMUNITY): Payer: Medicare Other | Admitting: Physical Therapy

## 2019-05-08 DIAGNOSIS — G8929 Other chronic pain: Secondary | ICD-10-CM

## 2019-05-08 DIAGNOSIS — R29898 Other symptoms and signs involving the musculoskeletal system: Secondary | ICD-10-CM | POA: Diagnosis not present

## 2019-05-08 DIAGNOSIS — M6281 Muscle weakness (generalized): Secondary | ICD-10-CM | POA: Diagnosis not present

## 2019-05-08 DIAGNOSIS — R293 Abnormal posture: Secondary | ICD-10-CM

## 2019-05-08 DIAGNOSIS — M5440 Lumbago with sciatica, unspecified side: Secondary | ICD-10-CM

## 2019-05-08 NOTE — Therapy (Signed)
[place], [place], [address] Phone: [phone]   Fax:  [phone]  Physical Therapy Treatment  Patient Details  Name: Deanna Salinas MRN: [id number] Date of Birth: Feb 18, 1936 Referring Provider (PT): Sallee Lange, MD   Encounter Date: 05/08/2019  PT End of Session - 05/08/19 0855    Visit Number  2    Number of Visits  8    Date for PT Re-Evaluation  06/01/19    Authorization Type  Medicare; Secondary: BCBS Supplement    Authorization Time Period  05/04/19 - 06/01/19    Authorization - Visit Number  2    Authorization - Number of Visits  10    PT Start Time  0820    PT Stop Time  0900    PT Time Calculation (min)  40 min    Activity Tolerance  Patient tolerated treatment well    Behavior During Therapy  [facility] for tasks assessed/performed       Past Medical History:  Diagnosis Date  . Essential hypertension   . Fatty liver   . Hyperlipidemia   . Type 2 diabetes mellitus ([place])     Past Surgical History:  Procedure Laterality Date  . APPENDECTOMY     At the time of hysterectomy  . CESAREAN SECTION     X5  . COLONOSCOPY    . COLONOSCOPY N/A 06/11/2013   Colonic diverticulosis, hyperplastic polyps. no further screening colonoscopies recommended  . ESOPHAGOGASTRODUODENOSCOPY N/A 02/20/2014   Procedure: ESOPHAGOGASTRODUODENOSCOPY (EGD);  Surgeon: Daneil Dolin, MD;  [facility];  Service: Endoscopy;  Laterality: N/A;  11:00    There were no vitals filed for this visit.  Subjective Assessment - 05/08/19 0833    Subjective  Pt states that there was a notable improvement of her pain .    Patient is accompained by:  Family member   Husband   Limitations  Standing;Walking;House hold activities    How long can you stand comfortably?  15 minutes    How long can you walk comfortably?  Can walk through [facility] with walker    Diagnostic tests  MRI, X-ray    Patient Stated Goals  Reduced pain     Currently in Pain?  No/denies         [facility] PT Assessment - 05/08/19 0001      Observation/Other Assessments   Focus on Therapeutic Outcomes (FOTO)   29      Ambulation/Gait   Ambulation Distance (Feet)  226 Feet    Assistive device  4-wheeled walker    Gait Pattern  Decreased hip/knee flexion - right;Decreased dorsiflexion - right;Decreased dorsiflexion - left    Gait Comments  2 MWT          [facility] Adult PT Treatment/Exercise - 05/08/19 0001      Exercises   Exercises  Lumbar      Lumbar Exercises: Stretches   Double Knee to Chest Stretch  3 reps;10 seconds      Lumbar Exercises: Supine   Ab Set  10 reps    Bridge  10 reps    Other Supine Lumbar Exercises  decompression ex 1-5     Other Supine Lumbar Exercises  sitting as tall as possible x 10             PT Education - 05/08/19 0854    Education Details  new HEP    Person(s) Educated  Patient    Methods  Explanation;Tactile cues;Verbal cues;Handout    Comprehension  Verbalized understanding;Returned demonstration       PT Short Term Goals - 05/08/19 0836      PT SHORT TERM GOAL #1   Title  Patient will report understanding and regular compliance with HEP to improve strength, decrease pain and improve overall functional mobility.    Time  2    Period  Weeks    Status  On-going    Target Date  05/18/19        PT Long Term Goals - 05/08/19 0909      PT LONG TERM GOAL #1   Title  Patient will report that her symptoms have improve by at least 50% in order to improve overall tolerance to daily activities and improve QOL.    Time  4    Period  Weeks    Status  On-going      PT LONG TERM GOAL #2   Title  Patient will demonstrate improvement of at least 1 MMT strength grade in order to improve mechanics with functional mobility and decrease forces on back.    Time  4    Period  Weeks    Status  On-going      PT LONG TERM GOAL #3   Title  Patient will report ability to stand for at least 30 minutes  in order to perform household chores such as doing dishes more independently.    Time  4    Period  Weeks    Status  On-going      PT LONG TERM GOAL #4   Title  Patient will demonstrate improved upright posture with ambulation to improve safety.    Time  4    Period  Weeks    Status  On-going      PT LONG TERM GOAL #5   Title  PT balance to be improved to allow pt to feel confident going out into the yard with her husband.    Time  4    Period  Weeks    Status  New            Plan - 05/08/19 [id number]    Clinical Impression Statement  Evaluation and goals reviewed with patient.  Foto and 2 MWT completed.  Pt treatment focused on posture this session. Therapist reccomends that pt posture be primary focus prior to Range so that  range is being completed in correct form.    Personal Factors and Comorbidities  Age;Time since onset of injury/illness/exacerbation;Comorbidity 3+    Comorbidities  HTN, DMII, OA bilateral knees    Examination-Activity Limitations  Squat;Bed Mobility;Stairs;Bend;Locomotion Level;Stand;Transfers    Examination-Participation Restrictions  Shop;Cleaning;Community Activity    Stability/Clinical Decision Making  Evolving/Moderate complexity    Rehab Potential  Fair    PT Frequency  2x / week    PT Duration  4 weeks    PT Treatment/Interventions  ADLs/Self Care Home Management;Aquatic Therapy;Cryotherapy;Electrical Stimulation;Iontophoresis 4mg /ml Dexamethasone;Moist Heat;Traction;DME Instruction;Gait training;Stair training;Functional mobility training;Therapeutic activities;Therapeutic exercise;Balance training;Neuromuscular re-education;Patient/family education;Manual techniques;Passive range of motion;Dry needling;Energy conservation;Taping    PT Next Visit Plan  Review Eval/goals/initial HEP. Focus on lumbar mobility improving posture, IE wall arch, standing at wall completing UE flexion. STM gluteals to decrease restrictions and pain relief.    PT Home Exercise  Plan  05/04/19: DKTC/ 9/28:  decompression ex, bridge and ab set    Consulted and Agree with Plan of Care  Patient;Family member/caregiver    Family Member Consulted  Husband  Patient will benefit from skilled therapeutic intervention in order to improve the following deficits and impairments:  Abnormal gait, Increased fascial restricitons, Improper body mechanics, Pain, Decreased mobility, Postural dysfunction, Decreased activity tolerance, Decreased endurance, Decreased range of motion, Decreased strength, Hypomobility, Difficulty walking, Obesity  Visit Diagnosis: Chronic bilateral low back pain with sciatica, sciatica laterality unspecified  Muscle weakness (generalized)  Other symptoms and signs involving the musculoskeletal system  Abnormal posture     Problem List Patient Active Problem List   Diagnosis Date Noted  . Primary osteoarthritis of both hands 01/23/2018  . Primary osteoarthritis of both knees 01/23/2018  . Primary osteoarthritis of both feet 01/23/2018  . Other idiopathic scoliosis, lumbar region 01/23/2018  . DDD (degenerative disc disease), lumbar 01/23/2018  . Hypothyroidism 08/20/2017  . Morbid obesity ([place]) 12/11/2016  . Leukocytosis 06/18/2015  . Type 2 diabetes mellitus ([place]) 06/16/2015  . Abdominal pain, epigastric 02/18/2014  . Elevated lipase 02/18/2014  . Pancreatitis, acute 12/11/2013  . POSTMENOPAUSAL OSTEOPOROSIS 02/17/2009  . Osteoarthritis 03/29/2008  . Chronic insomnia 09/14/2007  . Hyperlipidemia 08/26/2006  . DEPRESSION 08/26/2006  . MIGRAINE HEADACHE 08/26/2006  . CATARACT NOS 08/26/2006  . Essential hypertension 08/26/2006  . SCIATICA 08/26/2006    Rayetta Humphrey, PT CLT [phone] 05/08/2019, 9:12 AM  [facility] [address] [place], [place], [address] Phone: [phone]   Fax:  [phone]  Name: Deanna Salinas MRN: [id number] Date of Birth: 07-09-1936

## 2019-05-08 NOTE — Patient Instructions (Addendum)
Isometric Abdominal    Lying on back with knees bent, tighten stomach by pressing elbows down. Hold __3-5__ seconds. Repeat _10___ times per set. Do _1__ sets per session. Do _2___ sessions per day.  http://orth.exer.us/1086   Copyright  VHI. All rights reserved.  Bridging    Slowly raise buttocks from floor, keeping stomach tight. Repeat 10____ times per set. Do __1__ sets per session. Do __2__ sessions per day.  http://orth.exer.us/1096   Copyright  VHI. All rights reserved.

## 2019-05-10 ENCOUNTER — Ambulatory Visit (HOSPITAL_COMMUNITY): Payer: Medicare Other | Attending: Family Medicine

## 2019-05-10 ENCOUNTER — Encounter (HOSPITAL_COMMUNITY): Payer: Self-pay

## 2019-05-10 ENCOUNTER — Other Ambulatory Visit: Payer: Self-pay

## 2019-05-10 DIAGNOSIS — M5440 Lumbago with sciatica, unspecified side: Secondary | ICD-10-CM | POA: Diagnosis not present

## 2019-05-10 DIAGNOSIS — G8929 Other chronic pain: Secondary | ICD-10-CM

## 2019-05-10 DIAGNOSIS — R29898 Other symptoms and signs involving the musculoskeletal system: Secondary | ICD-10-CM | POA: Insufficient documentation

## 2019-05-10 DIAGNOSIS — M6281 Muscle weakness (generalized): Secondary | ICD-10-CM | POA: Insufficient documentation

## 2019-05-10 DIAGNOSIS — R293 Abnormal posture: Secondary | ICD-10-CM | POA: Diagnosis not present

## 2019-05-10 NOTE — Therapy (Signed)
[place], [place], [address] Phone: [phone]   Fax:  [phone]  Physical Therapy Treatment  Patient Details  Name: Deanna Salinas MRN: [id number] Date of Birth: [birth year] Referring Provider (PT): Sallee Lange, MD   Encounter Date: 05/10/2019  PT End of Session - 05/10/19 1129    Visit Number  3    Number of Visits  8    Date for PT Re-Evaluation  06/01/19    Authorization Type  Medicare; Secondary: BCBS Supplement    Authorization Time Period  05/04/19 - 06/01/19    Authorization - Visit Number  3    Authorization - Number of Visits  10    PT Start Time  1120    PT Stop Time  1200    PT Time Calculation (min)  40 min    Activity Tolerance  Patient tolerated treatment well    Behavior During Therapy  [facility] for tasks assessed/performed       Past Medical History:  Diagnosis Date  . Essential hypertension   . Fatty liver   . Hyperlipidemia   . Type 2 diabetes mellitus ([place])     Past Surgical History:  Procedure Laterality Date  . APPENDECTOMY     At the time of hysterectomy  . CESAREAN SECTION     X5  . COLONOSCOPY    . COLONOSCOPY N/A 06/11/2013   Colonic diverticulosis, hyperplastic polyps. no further screening colonoscopies recommended  . ESOPHAGOGASTRODUODENOSCOPY N/A 02/20/2014   Procedure: ESOPHAGOGASTRODUODENOSCOPY (EGD);  Surgeon: Daneil Dolin, MD;  [facility];  Service: Endoscopy;  Laterality: N/A;  11:00    There were no vitals filed for this visit.  Subjective Assessment - 05/10/19 1125    Subjective  Pt denies pain, but is trying to stand up straighter. Pt reports "tightness not pain" in her low back today. Pt reports difficulty with bridges in HEP due to cramping in hamstrings.    Patient is accompained by:  Family member   Husband   Limitations  Standing;Walking;House hold activities    How long can you stand comfortably?  15 minutes    How long can you walk  comfortably?  Can walk through [facility] with walker    Diagnostic tests  MRI, X-ray    Patient Stated Goals  Reduced pain    Currently in Pain?  No/denies           [facility] Adult PT Treatment/Exercise - 05/10/19 0001      Lumbar Exercises: Stretches   Other Lumbar Stretch Exercise  lumbar flexion, R/L for lat stretch over physioball, 10x10 reps each direction      Lumbar Exercises: Seated   Other Seated Lumbar Exercises  scap squeezes with upright posture, 2x10 reps; rows with upright posture, 2x10 reps    Other Seated Lumbar Exercises  BUE flexion with upright posture, x10 reps; thoracic rotation with arms across chest, upright posture, x10 reps each direction      Lumbar Exercises: Supine   Ab Set  10 reps    Pelvic Tilt  15 reps    Pelvic Tilt Limitations  tactile cues    Bridge  10 reps             PT Education - 05/10/19 1126    Education Details  Continue HEP, exercise technique    Person(s) Educated  Patient    Methods  Explanation    Comprehension  Verbalized understanding  PT Short Term Goals - 05/08/19 0836      PT SHORT TERM GOAL #1   Title  Patient will report understanding and regular compliance with HEP to improve strength, decrease pain and improve overall functional mobility.    Time  2    Period  Weeks    Status  On-going    Target Date  05/18/19        PT Long Term Goals - 05/08/19 0909      PT LONG TERM GOAL #1   Title  Patient will report that her symptoms have improve by at least 50% in order to improve overall tolerance to daily activities and improve QOL.    Time  4    Period  Weeks    Status  On-going      PT LONG TERM GOAL #2   Title  Patient will demonstrate improvement of at least 1 MMT strength grade in order to improve mechanics with functional mobility and decrease forces on back.    Time  4    Period  Weeks    Status  On-going      PT LONG TERM GOAL #3   Title  Patient will report ability to stand for at least 30  minutes in order to perform household chores such as doing dishes more independently.    Time  4    Period  Weeks    Status  On-going      PT LONG TERM GOAL #4   Title  Patient will demonstrate improved upright posture with ambulation to improve safety.    Time  4    Period  Weeks    Status  On-going      PT LONG TERM GOAL #5   Title  PT balance to be improved to allow pt to feel confident going out into the yard with her husband.    Time  4    Period  Weeks    Status  New            Plan - 05/10/19 1129    Clinical Impression Statement  Continue with postural strengthening and lumbar AROM this date. Resumed ab sets and bridging requiring intermittent verbal cues for form. Added pelvic tilts for additional deep core activation and low back decompression with tactile cues for all reps to maintain proper form. Progressed posture exercises with seated scap squeezes, scap retraction and BUE flexion for added upright posture strengthening this date, no pain and good technique. Pt attempted standing BUE flexion, but increased LBP so discontinued and resumed in sitting. Pt with good mobility performing forward flexion over physioball and reaching to R and L with 10 sec holds. Pt reports 0/10 pain at EOS. Continue to progress as able.    Personal Factors and Comorbidities  Age;Time since onset of injury/illness/exacerbation;Comorbidity 3+    Comorbidities  HTN, DMII, OA bilateral knees    Examination-Activity Limitations  Squat;Bed Mobility;Stairs;Bend;Locomotion Level;Stand;Transfers    Examination-Participation Restrictions  Shop;Cleaning;Community Activity    Stability/Clinical Decision Making  Evolving/Moderate complexity    Rehab Potential  Fair    PT Frequency  2x / week    PT Duration  4 weeks    PT Treatment/Interventions  ADLs/Self Care Home Management;Aquatic Therapy;Cryotherapy;Electrical Stimulation;Iontophoresis 4mg /ml Dexamethasone;Moist Heat;Traction;DME Instruction;Gait  training;Stair training;Functional mobility training;Therapeutic activities;Therapeutic exercise;Balance training;Neuromuscular re-education;Patient/family education;Manual techniques;Passive range of motion;Dry needling;Energy conservation;Taping    PT Next Visit Plan  Focus on lumbar mobility improving posture. Progress to standing posture if able, IE  wall arch, standing at wall completing UE flexion. STM gluteals to decrease restrictions and pain relief.    PT Home Exercise Plan  05/04/19: DKTC/ 9/28:  decompression ex, bridge and ab set    Consulted and Agree with Plan of Care  Patient;Family member/caregiver    Family Member Consulted  Husband       Patient will benefit from skilled therapeutic intervention in order to improve the following deficits and impairments:  Abnormal gait, Increased fascial restricitons, Improper body mechanics, Pain, Decreased mobility, Postural dysfunction, Decreased activity tolerance, Decreased endurance, Decreased range of motion, Decreased strength, Hypomobility, Difficulty walking, Obesity  Visit Diagnosis: Chronic bilateral low back pain with sciatica, sciatica laterality unspecified  Muscle weakness (generalized)  Other symptoms and signs involving the musculoskeletal system  Abnormal posture     Problem List Patient Active Problem List   Diagnosis Date Noted  . Primary osteoarthritis of both hands 01/23/2018  . Primary osteoarthritis of both knees 01/23/2018  . Primary osteoarthritis of both feet 01/23/2018  . Other idiopathic scoliosis, lumbar region 01/23/2018  . DDD (degenerative disc disease), lumbar 01/23/2018  . Hypothyroidism 08/20/2017  . Morbid obesity ([place]) 12/11/2016  . Leukocytosis 06/18/2015  . Type 2 diabetes mellitus ([place]) 06/16/2015  . Abdominal pain, epigastric 02/18/2014  . Elevated lipase 02/18/2014  . Pancreatitis, acute 12/11/2013  . POSTMENOPAUSAL OSTEOPOROSIS 02/17/2009  . Osteoarthritis 03/29/2008  . Chronic  insomnia 09/14/2007  . Hyperlipidemia 08/26/2006  . DEPRESSION 08/26/2006  . MIGRAINE HEADACHE 08/26/2006  . CATARACT NOS 08/26/2006  . Essential hypertension 08/26/2006  . SCIATICA 08/26/2006    Talbot Grumbling PT, DPT 05/10/19, 12:26 PM [place] [address] [place], [place], [address] Phone: [phone]   Fax:  [phone]  Name: Deanna Salinas MRN: [id number] Date of Birth: 09-23-1935

## 2019-05-11 ENCOUNTER — Telehealth: Payer: Self-pay | Admitting: Family Medicine

## 2019-05-11 DIAGNOSIS — H9190 Unspecified hearing loss, unspecified ear: Secondary | ICD-10-CM

## 2019-05-11 NOTE — Telephone Encounter (Signed)
Please advise. Thank you

## 2019-05-11 NOTE — Telephone Encounter (Signed)
Please go ahead with ENT referral Reason hearing loss

## 2019-05-11 NOTE — Telephone Encounter (Signed)
Referral put in and pt notified 

## 2019-05-11 NOTE — Telephone Encounter (Signed)
Pt requesting referral to ENT, loosing her hearing & has seen audiology & said that was a joke  Please advise & initiate referral in system

## 2019-05-15 ENCOUNTER — Ambulatory Visit (HOSPITAL_COMMUNITY): Payer: Medicare Other | Admitting: Physical Therapy

## 2019-05-15 ENCOUNTER — Other Ambulatory Visit: Payer: Self-pay

## 2019-05-15 ENCOUNTER — Encounter (HOSPITAL_COMMUNITY): Payer: Self-pay | Admitting: Physical Therapy

## 2019-05-15 ENCOUNTER — Encounter: Payer: Self-pay | Admitting: Family Medicine

## 2019-05-15 DIAGNOSIS — G8929 Other chronic pain: Secondary | ICD-10-CM | POA: Diagnosis not present

## 2019-05-15 DIAGNOSIS — M5440 Lumbago with sciatica, unspecified side: Secondary | ICD-10-CM | POA: Diagnosis not present

## 2019-05-15 DIAGNOSIS — M6281 Muscle weakness (generalized): Secondary | ICD-10-CM

## 2019-05-15 DIAGNOSIS — R293 Abnormal posture: Secondary | ICD-10-CM

## 2019-05-15 DIAGNOSIS — H6123 Impacted cerumen, bilateral: Secondary | ICD-10-CM | POA: Diagnosis not present

## 2019-05-15 DIAGNOSIS — R29898 Other symptoms and signs involving the musculoskeletal system: Secondary | ICD-10-CM

## 2019-05-15 NOTE — Therapy (Signed)
[place], [place], [address] Phone: [phone]   Fax:  [phone]  Physical Therapy Treatment  Patient Details  Name: Deanna Salinas MRN: [id number] Date of Birth: 05-30-1936 Referring Provider (PT): Sallee Lange, MD   Encounter Date: 05/15/2019  PT End of Session - 05/15/19 0909    Visit Number  4    Number of Visits  8    Date for PT Re-Evaluation  06/01/19    Authorization Type  Medicare; Secondary: BCBS Supplement    Authorization Time Period  05/04/19 - 06/01/19    Authorization - Visit Number  4    Authorization - Number of Visits  10    PT Start Time  0830    PT Stop Time  0909    PT Time Calculation (min)  39 min    Activity Tolerance  Patient tolerated treatment well    Behavior During Therapy  [facility] for tasks assessed/performed       Past Medical History:  Diagnosis Date  . Essential hypertension   . Fatty liver   . Hyperlipidemia   . Type 2 diabetes mellitus ([place])     Past Surgical History:  Procedure Laterality Date  . APPENDECTOMY     At the time of hysterectomy  . CESAREAN SECTION     X5  . COLONOSCOPY    . COLONOSCOPY N/A 06/11/2013   Colonic diverticulosis, hyperplastic polyps. no further screening colonoscopies recommended  . ESOPHAGOGASTRODUODENOSCOPY N/A 02/20/2014   Procedure: ESOPHAGOGASTRODUODENOSCOPY (EGD);  Surgeon: Daneil Dolin, MD;  Location: [facility];  Service: Endoscopy;  Laterality: N/A;  11:00    There were no vitals filed for this visit.  Subjective Assessment - 05/15/19 0828    Subjective  PT states she is doing her exercises, she can tell a difference    Patient is accompained by:  Family member   Husband   Limitations  Standing;Walking;House hold activities    How long can you stand comfortably?  15 minutes    How long can you walk comfortably?  Can walk through [facility] with walker    Diagnostic tests  MRI, X-ray    Patient Stated Goals  Reduced pain     Currently in Pain?  No/denies                       [facility] Adult PT Treatment/Exercise - 05/15/19 0001      Exercises   Exercises  Lumbar      Lumbar Exercises: Stretches   Active Hamstring Stretch  3 reps;30 seconds    Prone on Elbows Stretch  5 reps;10 seconds    Other Lumbar Stretch Exercise  thoracic  excursion       Lumbar Exercises: Standing   Other Standing Lumbar Exercises  wall arch x 10    Other Standing Lumbar Exercises  Back against wall hold back still with B UE flexion x 5       Lumbar Exercises: Seated   Sit to Stand  5 reps      Lumbar Exercises: Supine   Clam  10 reps    Bridge  15 reps    Other Supine Lumbar Exercises  decompression ex 1-5     Other Supine Lumbar Exercises  toe tap x 5       Lumbar Exercises: Sidelying   Hip Abduction  10 reps      Lumbar Exercises: Prone   Straight Leg  Raise  10 reps               PT Short Term Goals - 05/08/19 0836      PT SHORT TERM GOAL #1   Title  Patient will report understanding and regular compliance with HEP to improve strength, decrease pain and improve overall functional mobility.    Time  2    Period  Weeks    Status  On-going    Target Date  05/18/19        PT Long Term Goals - 05/08/19 0909      PT LONG TERM GOAL #1   Title  Patient will report that her symptoms have improve by at least 50% in order to improve overall tolerance to daily activities and improve QOL.    Time  4    Period  Weeks    Status  On-going      PT LONG TERM GOAL #2   Title  Patient will demonstrate improvement of at least 1 MMT strength grade in order to improve mechanics with functional mobility and decrease forces on back.    Time  4    Period  Weeks    Status  On-going      PT LONG TERM GOAL #3   Title  Patient will report ability to stand for at least 30 minutes in order to perform household chores such as doing dishes more independently.    Time  4    Period  Weeks    Status  On-going       PT LONG TERM GOAL #4   Title  Patient will demonstrate improved upright posture with ambulation to improve safety.    Time  4    Period  Weeks    Status  On-going      PT LONG TERM GOAL #5   Title  PT balance to be improved to allow pt to feel confident going out into the yard with her husband.    Time  4    Period  Weeks    Status  New            Plan - 05/15/19 0909    Clinical Impression Statement  Pt very cooperative.  Added new exercises with minimal difficulty but noted weakened core limits abilty to complete exercises with 100% correct technique.  PT needs therapist to basically yell due to significant HOH.    Personal Factors and Comorbidities  Age;Time since onset of injury/illness/exacerbation;Comorbidity 3+    Comorbidities  HTN, DMII, OA bilateral knees    Examination-Activity Limitations  Squat;Bed Mobility;Stairs;Bend;Locomotion Level;Stand;Transfers    Examination-Participation Restrictions  Shop;Cleaning;Community Activity    Stability/Clinical Decision Making  Evolving/Moderate complexity    Rehab Potential  Fair    PT Frequency  2x / week    PT Duration  4 weeks    PT Treatment/Interventions  ADLs/Self Care Home Management;Aquatic Therapy;Cryotherapy;Electrical Stimulation;Iontophoresis 4mg /ml Dexamethasone;Moist Heat;Traction;DME Instruction;Gait training;Stair training;Functional mobility training;Therapeutic activities;Therapeutic exercise;Balance training;Neuromuscular re-education;Patient/family education;Manual techniques;Passive range of motion;Dry needling;Energy conservation;Taping    PT Next Visit Plan  Focus on lumbar mobility improving posture. Progress to standing posture t band  if able, . STM gluteals to decrease restrictions and pain relief.    PT Home Exercise Plan  05/04/19: DKTC/ 9/28:  decompression ex, bridge and ab set    Consulted and Agree with Plan of Care  Patient;Family member/caregiver    Family Member Consulted  Husband        Patient will  benefit from skilled therapeutic intervention in order to improve the following deficits and impairments:  Abnormal gait, Increased fascial restricitons, Improper body mechanics, Pain, Decreased mobility, Postural dysfunction, Decreased activity tolerance, Decreased endurance, Decreased range of motion, Decreased strength, Hypomobility, Difficulty walking, Obesity  Visit Diagnosis: Chronic bilateral low back pain with sciatica, sciatica laterality unspecified  Muscle weakness (generalized)  Other symptoms and signs involving the musculoskeletal system  Abnormal posture     Problem List Patient Active Problem List   Diagnosis Date Noted  . Primary osteoarthritis of both hands 01/23/2018  . Primary osteoarthritis of both knees 01/23/2018  . Primary osteoarthritis of both feet 01/23/2018  . Other idiopathic scoliosis, lumbar region 01/23/2018  . DDD (degenerative disc disease), lumbar 01/23/2018  . Hypothyroidism 08/20/2017  . Morbid obesity ([place]) 12/11/2016  . Leukocytosis 06/18/2015  . Type 2 diabetes mellitus ([place]) 06/16/2015  . Abdominal pain, epigastric 02/18/2014  . Elevated lipase 02/18/2014  . Pancreatitis, acute 12/11/2013  . POSTMENOPAUSAL OSTEOPOROSIS 02/17/2009  . Osteoarthritis 03/29/2008  . Chronic insomnia 09/14/2007  . Hyperlipidemia 08/26/2006  . DEPRESSION 08/26/2006  . MIGRAINE HEADACHE 08/26/2006  . CATARACT NOS 08/26/2006  . Essential hypertension 08/26/2006  . SCIATICA 08/26/2006   Rayetta Humphrey, PT CLT [phone] 05/15/2019, 9:13 AM  [place] [address] [place], [place], [address] Phone: [phone]   Fax:  [phone]  Name: KARA DRILLING MRN: [id number] Date of Birth: 31-Oct-1935

## 2019-05-15 NOTE — Patient Instructions (Signed)
Straight Leg Raise (Prone)    Abdomen and head supported, keep left knee locked and raise leg at hip. Avoid arching low back. Repeat _10___ times per set. Do __1__ sets per session. Do __2__ sessions per day.  http://orth.exer.us/1112   Copyright  VHI. All rights reserved.  Strengthening: Hip Abduction (Side-Lying)    Tighten muscles on front of left thigh, then lift leg _15___ inches from surface, keeping knee locked.  Repeat __10__ times per set. Do __1__ sets per session. Do ___2_ sessions per day.  http://orth.exer.us/622   Copyright  VHI. All rights reserved.

## 2019-05-16 ENCOUNTER — Ambulatory Visit (INDEPENDENT_AMBULATORY_CARE_PROVIDER_SITE_OTHER): Payer: Medicare Other | Admitting: Family Medicine

## 2019-05-16 DIAGNOSIS — M4800 Spinal stenosis, site unspecified: Secondary | ICD-10-CM

## 2019-05-16 DIAGNOSIS — I10 Essential (primary) hypertension: Secondary | ICD-10-CM

## 2019-05-16 DIAGNOSIS — G4700 Insomnia, unspecified: Secondary | ICD-10-CM

## 2019-05-16 NOTE — Progress Notes (Signed)
Subjective:    Patient ID: Deanna Salinas, female    DOB: December 06, 1935, 83 y.o.   MRN: [id number] Video HPIfollow up on back pain. Pt states she has not had to take any pain meds in 9 days since the physical therapy is helping with back pain.  She is seen improvement with physical therapy not having to take pain medicine which is a blessing  Trouble sleeping.  Severe difficulty sleeping many times she lays down for 1 to 2 hours wakes up and then cannot sleep the rest of the night she states sometimes she is up all night while she denies being depressed denies mania she does not use much in way of caffeine see discussion below  Side effects from bp meds.  She is concerned the pain in her legs could be coming from her blood pressure medicine but we discussed this in detail and it really does not seem like her blood pressure medicine is causing this I believe it is more likely her spinal stenosis  Virtual Visit via Telephone Note  I connected with Deanna Salinas on 05/16/19 at  1:10 PM EDT by telephone and verified that I am speaking with the correct person using two identifiers.  Location: Patient: home Provider: office   I discussed the limitations, risks, security and privacy concerns of performing an evaluation and management service by telephone and the availability of in person appointments. I also discussed with the patient that there may be a patient responsible charge related to this service. The patient expressed understanding and agreed to proceed.   History of Present Illness:    Observations/Objective:   Assessment and Plan:   Follow Up Instructions:    I discussed the assessment and treatment plan with the patient. The patient was provided an opportunity to ask questions and all were answered. The patient agreed with the plan and demonstrated an understanding of the instructions.   The patient was advised to call back or seek an in-person evaluation if the symptoms  worsen or if the condition fails to improve as anticipated.  I provided 15 minutes of non-face-to-face time during this encounter.        Review of Systems  Constitutional: Negative for activity change and appetite change.  HENT: Negative for congestion and rhinorrhea.   Respiratory: Negative for cough and shortness of breath.   Cardiovascular: Negative for chest pain and leg swelling.  Gastrointestinal: Negative for abdominal pain, nausea and vomiting.  Musculoskeletal: Positive for arthralgias and back pain. Negative for gait problem and joint swelling.  Skin: Negative for color change.  Neurological: Negative for dizziness and weakness.  Psychiatric/Behavioral: Negative for agitation and confusion.       Objective:   Physical Exam  Today's visit was via telephone Physical exam was not possible for this visit       Assessment & Plan:  Insomnia-this patient has had severe insomnia for 5 to 7 years.  During this time she is tried behavioral techniques which have not been helpful for her.  She is avoided caffeine's which unfortunately has not helped.  Patient denies being depressed.  She has tried [place] as well as trazodone.  Lunesta did not help some but would only last for 3 to 4 hours. We have avoided benzodiazepines because of risk of tolerance We have tried Vistaril without any help At this point I recommend that we connect with a geriatric specialist in order to get their input  Blood pressure reportedly doing well-she is  concerned that some of the pain she is feeling in her legs is due to the amlodipine.  In my opinion this is coming from her spinal stenosis.  Spinal stenosis is doing better with physical therapy she will follow-up with neurosurgery later this year hopefully they can try injections but I do not feel she would be a great surgical candidate unless it was absolutely necessary

## 2019-05-17 ENCOUNTER — Encounter (HOSPITAL_COMMUNITY): Payer: Self-pay

## 2019-05-17 ENCOUNTER — Other Ambulatory Visit: Payer: Self-pay

## 2019-05-17 ENCOUNTER — Ambulatory Visit (HOSPITAL_COMMUNITY): Payer: Medicare Other

## 2019-05-17 DIAGNOSIS — G8929 Other chronic pain: Secondary | ICD-10-CM | POA: Diagnosis not present

## 2019-05-17 DIAGNOSIS — R293 Abnormal posture: Secondary | ICD-10-CM | POA: Diagnosis not present

## 2019-05-17 DIAGNOSIS — M5440 Lumbago with sciatica, unspecified side: Secondary | ICD-10-CM

## 2019-05-17 DIAGNOSIS — R29898 Other symptoms and signs involving the musculoskeletal system: Secondary | ICD-10-CM

## 2019-05-17 DIAGNOSIS — M6281 Muscle weakness (generalized): Secondary | ICD-10-CM | POA: Diagnosis not present

## 2019-05-17 NOTE — Therapy (Signed)
[place], [place], [address] Phone: [phone]   Fax:  [phone]  Physical Therapy Treatment  Patient Details  Name: Deanna Salinas MRN: [id number] Date of Birth: 03-14-1936 Referring Provider (PT): Sallee Lange, MD   Encounter Date: 05/17/2019  PT End of Session - 05/17/19 0951    Visit Number  5    Number of Visits  8    Date for PT Re-Evaluation  06/01/19    Authorization Type  Medicare; Secondary: BCBS Supplement    Authorization Time Period  05/04/19 - 06/01/19    Authorization - Visit Number  5    Authorization - Number of Visits  10    PT Start Time  0948    PT Stop Time  1028    PT Time Calculation (min)  40 min    Activity Tolerance  Patient tolerated treatment well    Behavior During Therapy  [facility] for tasks assessed/performed       Past Medical History:  Diagnosis Date  . Essential hypertension   . Fatty liver   . Hyperlipidemia   . Type 2 diabetes mellitus ([place])     Past Surgical History:  Procedure Laterality Date  . APPENDECTOMY     At the time of hysterectomy  . CESAREAN SECTION     X5  . COLONOSCOPY    . COLONOSCOPY N/A 06/11/2013   Colonic diverticulosis, hyperplastic polyps. no further screening colonoscopies recommended  . ESOPHAGOGASTRODUODENOSCOPY N/A 02/20/2014   Procedure: ESOPHAGOGASTRODUODENOSCOPY (EGD);  Surgeon: Daneil Dolin, MD;  Location: [facility];  Service: Endoscopy;  Laterality: N/A;  11:00    There were no vitals filed for this visit.  Subjective Assessment - 05/17/19 0950    Subjective  Pt reports soreness from using muscles she hasn't used in a long time. Pt reports had earwax cleared off and can hear significantly better.    Patient is accompained by:  Family member   Husband   Limitations  Standing;Walking;House hold activities    How long can you stand comfortably?  15 minutes    How long can you walk comfortably?  Can walk through [facility] with  walker    Diagnostic tests  MRI, X-ray    Patient Stated Goals  Reduced pain    Currently in Pain?  No/denies            [facility] Adult PT Treatment/Exercise - 05/17/19 0001      Lumbar Exercises: Standing   Other Standing Lumbar Exercises  gait training with and without rollator, upright posture verbal cues    Other Standing Lumbar Exercises  Back against wall hold back still with B UE flexion, x10 reps      Lumbar Exercises: Seated   Sit to Stand  5 reps   intermittent UE assist   Other Seated Lumbar Exercises  scap squeezes with upright posture, x15 reps; rows with upright posture, x15 reps      Lumbar Exercises: Supine   Ab Set  15 reps    AB Set Limitations  TA set with exhale    Pelvic Tilt  15 reps    Pelvic Tilt Limitations  tactile cues    Bridge  15 reps    Other Supine Lumbar Exercises  alternating BLE marches, x10 reps BLE      Lumbar Exercises: Sidelying   Hip Abduction  10 reps             PT Education -  05/17/19 0951    Education Details  Continue HEP, exercise technique    Person(s) Educated  Patient    Methods  Explanation    Comprehension  Verbalized understanding       PT Short Term Goals - 05/08/19 0836      PT SHORT TERM GOAL #1   Title  Patient will report understanding and regular compliance with HEP to improve strength, decrease pain and improve overall functional mobility.    Time  2    Period  Weeks    Status  On-going    Target Date  05/18/19        PT Long Term Goals - 05/08/19 0909      PT LONG TERM GOAL #1   Title  Patient will report that her symptoms have improve by at least 50% in order to improve overall tolerance to daily activities and improve QOL.    Time  4    Period  Weeks    Status  On-going      PT LONG TERM GOAL #2   Title  Patient will demonstrate improvement of at least 1 MMT strength grade in order to improve mechanics with functional mobility and decrease forces on back.    Time  4    Period  Weeks     Status  On-going      PT LONG TERM GOAL #3   Title  Patient will report ability to stand for at least 30 minutes in order to perform household chores such as doing dishes more independently.    Time  4    Period  Weeks    Status  On-going      PT LONG TERM GOAL #4   Title  Patient will demonstrate improved upright posture with ambulation to improve safety.    Time  4    Period  Weeks    Status  On-going      PT LONG TERM GOAL #5   Title  PT balance to be improved to allow pt to feel confident going out into the yard with her husband.    Time  4    Period  Weeks    Status  New            Plan - 05/17/19 0951    Clinical Impression Statement  Added supine marches with TA set to progress core strengthening with good technique. Continued with postural exercises this date, increasing reps to improve endurance. Added gait training with and without AD focusing on upright posture, out of forward flexion to improve balance and postural strengthening. Pt with increased difficulty performing STS reps, educated to push through bil flat feet and heels to assist in glute and hamstring activation to rise from seated surfaces, but requires intermittent UE assist to rise. Pt requires therapeutic rest breaks to recover. Continue to progress as able.    Personal Factors and Comorbidities  Age;Time since onset of injury/illness/exacerbation;Comorbidity 3+    Comorbidities  HTN, DMII, OA bilateral knees    Examination-Activity Limitations  Squat;Bed Mobility;Stairs;Bend;Locomotion Level;Stand;Transfers    Examination-Participation Restrictions  Shop;Cleaning;Community Activity    Stability/Clinical Decision Making  Evolving/Moderate complexity    Rehab Potential  Fair    PT Frequency  2x / week    PT Duration  4 weeks    PT Treatment/Interventions  ADLs/Self Care Home Management;Aquatic Therapy;Cryotherapy;Electrical Stimulation;Iontophoresis 4mg /ml Dexamethasone;Moist Heat;Traction;DME  Instruction;Gait training;Stair training;Functional mobility training;Therapeutic activities;Therapeutic exercise;Balance training;Neuromuscular re-education;Patient/family education;Manual techniques;Passive range of motion;Dry needling;Energy conservation;Taping  PT Next Visit Plan  Focus on lumbar mobility improving posture. Progress to standing posture exercises. STM gluteals to decrease restrictions and pain relief.    PT Home Exercise Plan  05/04/19: DKTC/ 9/28:  decompression ex, bridge and ab set; 10/6: hip abd in sidelying    Consulted and Agree with Plan of Care  Patient       Patient will benefit from skilled therapeutic intervention in order to improve the following deficits and impairments:  Abnormal gait, Increased fascial restricitons, Improper body mechanics, Pain, Decreased mobility, Postural dysfunction, Decreased activity tolerance, Decreased endurance, Decreased range of motion, Decreased strength, Hypomobility, Difficulty walking, Obesity  Visit Diagnosis: Chronic bilateral low back pain with sciatica, sciatica laterality unspecified  Muscle weakness (generalized)  Other symptoms and signs involving the musculoskeletal system  Abnormal posture     Problem List Patient Active Problem List   Diagnosis Date Noted  . Primary osteoarthritis of both hands 01/23/2018  . Primary osteoarthritis of both knees 01/23/2018  . Primary osteoarthritis of both feet 01/23/2018  . Other idiopathic scoliosis, lumbar region 01/23/2018  . DDD (degenerative disc disease), lumbar 01/23/2018  . Hypothyroidism 08/20/2017  . Morbid obesity ([place]) 12/11/2016  . Leukocytosis 06/18/2015  . Type 2 diabetes mellitus ([place]) 06/16/2015  . Abdominal pain, epigastric 02/18/2014  . Elevated lipase 02/18/2014  . Pancreatitis, acute 12/11/2013  . POSTMENOPAUSAL OSTEOPOROSIS 02/17/2009  . Osteoarthritis 03/29/2008  . Chronic insomnia 09/14/2007  . Hyperlipidemia 08/26/2006  . DEPRESSION  08/26/2006  . MIGRAINE HEADACHE 08/26/2006  . CATARACT NOS 08/26/2006  . Essential hypertension 08/26/2006  . SCIATICA 08/26/2006     Talbot Grumbling PT, DPT 05/17/19, 10:31 AM [place] [address] [place], [place], [address] Phone: [phone]   Fax:  [phone]  Name: LAIYAH KROLIKOWSKI MRN: [id number] Date of Birth: [birth year]

## 2019-05-21 ENCOUNTER — Encounter: Payer: Self-pay | Admitting: Family Medicine

## 2019-05-22 ENCOUNTER — Telehealth (HOSPITAL_COMMUNITY): Payer: Self-pay

## 2019-05-22 ENCOUNTER — Ambulatory Visit (HOSPITAL_COMMUNITY): Payer: Medicare Other

## 2019-05-22 NOTE — Telephone Encounter (Signed)
pt's husband called to cancel today's appt due to the pt was up all night and she had a bad night.

## 2019-05-23 ENCOUNTER — Telehealth (HOSPITAL_COMMUNITY): Payer: Self-pay | Admitting: Physical Therapy

## 2019-05-23 NOTE — Telephone Encounter (Signed)
pt lmonvm to cancel alL her  appts due to she has some health issues that she needs to take care of. called the pt back to see if she wants to be discharged. LMONVM FOR A CALL BACK

## 2019-05-24 ENCOUNTER — Ambulatory Visit (HOSPITAL_COMMUNITY): Payer: Medicare Other | Admitting: Physical Therapy

## 2019-05-25 ENCOUNTER — Encounter: Payer: Self-pay | Admitting: Family Medicine

## 2019-05-29 ENCOUNTER — Encounter (HOSPITAL_COMMUNITY): Payer: Medicare Other | Admitting: Physical Therapy

## 2019-05-30 ENCOUNTER — Other Ambulatory Visit: Payer: Self-pay | Admitting: Family Medicine

## 2019-05-31 ENCOUNTER — Encounter (HOSPITAL_COMMUNITY): Payer: Medicare Other | Admitting: Physical Therapy

## 2019-06-01 ENCOUNTER — Other Ambulatory Visit: Payer: Self-pay | Admitting: Family Medicine

## 2019-06-01 NOTE — Telephone Encounter (Signed)
Patient states she gave up the pain pills so she could get sleeping pills but since that didn't work out for her she wanted to restart her pain pills to help -currently doing melatonin

## 2019-06-01 NOTE — Telephone Encounter (Signed)
So the last time I had a conversation with the patient she states that she had came off of this medication and did not needed any further  Somewhat puzzled by the request for refill May have been an automated request by the pharmacy? Please check with the patient to verify before we do anything

## 2019-06-04 NOTE — Telephone Encounter (Signed)
I would recommend a smaller amount of medication. Patient should use sparingly not on a multiple times a day basis

## 2019-06-05 ENCOUNTER — Encounter (HOSPITAL_COMMUNITY): Payer: Medicare Other | Admitting: Physical Therapy

## 2019-06-07 ENCOUNTER — Encounter (HOSPITAL_COMMUNITY): Payer: Medicare Other | Admitting: Physical Therapy

## 2019-06-10 ENCOUNTER — Encounter: Payer: Self-pay | Admitting: Family Medicine

## 2019-06-12 ENCOUNTER — Encounter (HOSPITAL_COMMUNITY): Payer: Medicare Other | Admitting: Physical Therapy

## 2019-06-14 ENCOUNTER — Encounter (HOSPITAL_COMMUNITY): Payer: Medicare Other | Admitting: Physical Therapy

## 2019-06-26 DIAGNOSIS — H04123 Dry eye syndrome of bilateral lacrimal glands: Secondary | ICD-10-CM | POA: Diagnosis not present

## 2019-06-27 ENCOUNTER — Other Ambulatory Visit: Payer: Self-pay | Admitting: Family Medicine

## 2019-07-02 ENCOUNTER — Encounter: Payer: Self-pay | Admitting: Family Medicine

## 2019-07-04 DIAGNOSIS — M545 Low back pain: Secondary | ICD-10-CM | POA: Diagnosis not present

## 2019-07-04 DIAGNOSIS — G629 Polyneuropathy, unspecified: Secondary | ICD-10-CM | POA: Diagnosis not present

## 2019-07-04 DIAGNOSIS — G4700 Insomnia, unspecified: Secondary | ICD-10-CM | POA: Insufficient documentation

## 2019-07-04 DIAGNOSIS — I10 Essential (primary) hypertension: Secondary | ICD-10-CM | POA: Diagnosis not present

## 2019-07-04 DIAGNOSIS — M4316 Spondylolisthesis, lumbar region: Secondary | ICD-10-CM | POA: Diagnosis not present

## 2019-07-04 DIAGNOSIS — M4126 Other idiopathic scoliosis, lumbar region: Secondary | ICD-10-CM | POA: Diagnosis not present

## 2019-07-04 DIAGNOSIS — M5416 Radiculopathy, lumbar region: Secondary | ICD-10-CM | POA: Diagnosis not present

## 2019-07-04 DIAGNOSIS — Z6835 Body mass index (BMI) 35.0-35.9, adult: Secondary | ICD-10-CM | POA: Diagnosis not present

## 2019-07-12 ENCOUNTER — Other Ambulatory Visit: Payer: Self-pay | Admitting: *Deleted

## 2019-07-12 ENCOUNTER — Encounter: Payer: Self-pay | Admitting: Family Medicine

## 2019-07-12 MED ORDER — TEMAZEPAM 15 MG PO CAPS: 15.0000 mg | ORAL_CAPSULE | Freq: Every evening | ORAL | 0 refills | Status: DC | PRN

## 2019-07-12 NOTE — Telephone Encounter (Signed)
rx sent to pharm and pt notified.

## 2019-07-18 DIAGNOSIS — Z6835 Body mass index (BMI) 35.0-35.9, adult: Secondary | ICD-10-CM | POA: Diagnosis not present

## 2019-07-18 DIAGNOSIS — M48061 Spinal stenosis, lumbar region without neurogenic claudication: Secondary | ICD-10-CM | POA: Insufficient documentation

## 2019-07-18 DIAGNOSIS — M48062 Spinal stenosis, lumbar region with neurogenic claudication: Secondary | ICD-10-CM | POA: Diagnosis not present

## 2019-07-18 DIAGNOSIS — I10 Essential (primary) hypertension: Secondary | ICD-10-CM | POA: Diagnosis not present

## 2019-08-07 ENCOUNTER — Other Ambulatory Visit: Payer: Self-pay | Admitting: Family Medicine

## 2019-08-21 ENCOUNTER — Ambulatory Visit: Payer: Medicare Other | Attending: Internal Medicine

## 2019-08-21 DIAGNOSIS — Z23 Encounter for immunization: Secondary | ICD-10-CM | POA: Insufficient documentation

## 2019-08-21 NOTE — Progress Notes (Signed)
   Covid-19 Vaccination Clinic  Name:  Deanna Salinas    MRN: [id number] DOB: 11-19-1935  08/21/2019  Deanna Salinas was observed post Covid-19 immunization for 15 minutes without incidence. She was provided with Vaccine Information Sheet and instruction to access the V-Safe system.   Deanna Salinas was instructed to call 911 with any severe reactions post vaccine: Marland Kitchen Difficulty breathing  . Swelling of your face and throat  . A fast heartbeat  . A bad rash all over your body  . Dizziness and weakness    Immunizations Administered    Name Date Dose VIS Date Route   Pfizer COVID-19 Vaccine 08/21/2019  9:16 AM 0.3 mL 07/20/2019 Intramuscular   Manufacturer: [employer], [employer]   Lot: [id number]   [place]: [id number]

## 2019-08-22 ENCOUNTER — Encounter: Payer: Self-pay | Admitting: Family Medicine

## 2019-08-22 DIAGNOSIS — M5416 Radiculopathy, lumbar region: Secondary | ICD-10-CM | POA: Diagnosis not present

## 2019-08-22 DIAGNOSIS — M48062 Spinal stenosis, lumbar region with neurogenic claudication: Secondary | ICD-10-CM | POA: Diagnosis not present

## 2019-08-23 NOTE — Telephone Encounter (Signed)
Nurses Please go ahead with trazodone 100 mg 1 nightly y, #30, 5 refills  Nurses please also encourage patient not to drink gin at nighttime  1 ounce per day is permissible I would not go beyond that Follow-up virtual visit in 3 to 4 weeks

## 2019-08-27 MED ORDER — TRAZODONE HCL 100 MG PO TABS: ORAL_TABLET | ORAL | 5 refills | Status: DC

## 2019-08-27 NOTE — Addendum Note (Signed)
Addended by: Vicente Males on: 08/27/2019 03:32 PM   Modules accepted: Orders

## 2019-09-07 ENCOUNTER — Ambulatory Visit: Payer: Medicare Other

## 2019-09-08 ENCOUNTER — Ambulatory Visit: Payer: Medicare Other | Attending: Internal Medicine

## 2019-09-08 DIAGNOSIS — Z23 Encounter for immunization: Secondary | ICD-10-CM | POA: Insufficient documentation

## 2019-09-08 NOTE — Progress Notes (Signed)
   Covid-19 Vaccination Clinic  Name:  Deanna Salinas    MRN: [id number] DOB: 1936-07-28  09/08/2019  [name] was observed post Covid-19 immunization for 15 minutes without incidence. She was provided with Vaccine Information Sheet and instruction to access the V-Safe system.   [name] was instructed to call 911 with any severe reactions post vaccine: Marland Kitchen Difficulty breathing  . Swelling of your face and throat  . A fast heartbeat  . A bad rash all over your body  . Dizziness and weakness    Immunizations Administered    Name Date Dose VIS Date Route   Pfizer COVID-19 Vaccine 09/08/2019  8:40 AM 0.3 mL 07/20/2019 Intramuscular   Manufacturer: [address]   Lot: [id number]   [place]: [id number]

## 2019-09-10 ENCOUNTER — Other Ambulatory Visit: Payer: Self-pay | Admitting: Family Medicine

## 2019-10-22 ENCOUNTER — Encounter: Payer: Self-pay | Admitting: Family Medicine

## 2019-10-23 NOTE — Telephone Encounter (Signed)
Nurses This is a complex issue.  Certainly it would be beneficial for all people to get 7 to 8 hours of sleep every night but reality is many do not.  Also the use of sedating medications with senior citizens is something we have discussed before with her.  I would recommend that she do a follow-up visit with [place] either virtual or in person to discuss this matter further and see if there is a potential option we could utilize to try to help her  Thanks-Dr. Nicki Reaper

## 2019-10-23 NOTE — Telephone Encounter (Signed)
Left message to return call 

## 2019-10-24 NOTE — Telephone Encounter (Signed)
Called and discussed with pt and she schedule a visit to discuss further.

## 2019-10-29 ENCOUNTER — Other Ambulatory Visit: Payer: Self-pay

## 2019-10-29 ENCOUNTER — Ambulatory Visit (INDEPENDENT_AMBULATORY_CARE_PROVIDER_SITE_OTHER): Payer: Medicare Other | Admitting: Family Medicine

## 2019-10-29 VITALS — BP 128/88

## 2019-10-29 DIAGNOSIS — G4700 Insomnia, unspecified: Secondary | ICD-10-CM | POA: Diagnosis not present

## 2019-10-29 DIAGNOSIS — E119 Type 2 diabetes mellitus without complications: Secondary | ICD-10-CM | POA: Diagnosis not present

## 2019-10-29 DIAGNOSIS — I10 Essential (primary) hypertension: Secondary | ICD-10-CM | POA: Diagnosis not present

## 2019-10-29 DIAGNOSIS — M8580 Other specified disorders of bone density and structure, unspecified site: Secondary | ICD-10-CM

## 2019-10-29 DIAGNOSIS — Z13220 Encounter for screening for lipoid disorders: Secondary | ICD-10-CM

## 2019-10-29 DIAGNOSIS — Z780 Asymptomatic menopausal state: Secondary | ICD-10-CM | POA: Diagnosis not present

## 2019-10-29 DIAGNOSIS — Z79899 Other long term (current) drug therapy: Secondary | ICD-10-CM | POA: Diagnosis not present

## 2019-10-29 MED ORDER — MIRTAZAPINE 15 MG PO TABS: 15.0000 mg | ORAL_TABLET | Freq: Every day | ORAL | 1 refills | Status: DC

## 2019-10-29 NOTE — Progress Notes (Signed)
Subjective:    Patient ID: Deanna Salinas, female    DOB: 12-01-1935, 84 y.o.   MRN: [id number] Very nice patient here with her husband HPIpt arrives to discuss insomnia. Pt states her body is in stress because she is not sleeping. Husband states she is lethargic and she is "dying".  Pt has tried trazodone.  Patient does not have much energy sits around a lot occasionally sleeps a moment or 2 during the day but not long at all at nighttime she takes 1 ounce of gin with Benadryl to help her sleep We have tried multiple different measures with her over the years she is tried [place] Ambien nerve pills trazodone amitriptyline and none of them have helped her sleep in addition to this the patient states she is found herself feeling stressed out about all of this Because of this she states she has not done a good job of taking care of her self She takes her medicine but she tries to watch what she eats to some degree She has had significant issues with diabetes morbid obesity hypertension pedal edema 30 minutes spent with patient between discussion of medication options listening to the patient documenting reviewing old notes as well as labs and ordering new tests  Review of Systems  Constitutional: Negative for activity change and appetite change.  HENT: Negative for congestion and rhinorrhea.   Respiratory: Negative for cough and shortness of breath.   Cardiovascular: Negative for chest pain and leg swelling.  Gastrointestinal: Negative for abdominal pain, nausea and vomiting.  Musculoskeletal: Positive for arthralgias and back pain.  Skin: Negative for color change.  Neurological: Negative for dizziness and weakness.  Psychiatric/Behavioral: Negative for agitation and confusion.       Objective:   Physical Exam Vitals reviewed.  Constitutional:      Appearance: She is well-developed.  HENT:     Head: Normocephalic.  Cardiovascular:     Heart sounds: No murmur.  Pulmonary:   Breath sounds: Normal breath sounds.  Skin:    General: Skin is warm and dry.  Neurological:     Mental Status: She is alert.           Assessment & Plan:  1. Insomnia, unspecified type Severe insomnia this is been lifelong worse over the past several years she is done a sleep study which was negative is tried numerous medicines as listed above and in the medical record This is not an easy choice because all medications run the risk of causing significant decline in cognitive skills and increased risk of accidents and injuries but currently she is sleeping less than an hour every night she is miserable and she is using OTC measures to help her sleep which is not healthy at all Based upon all this we will try Remeron at nighttime to see if this will help her sleep She will follow-up in 3 weeks If this is not helping enough we will consider stopping it and trying temazepam which she is requesting but at the moment I would like to try Remeron first  2. Essential hypertension Blood pressure decent control watch diet stay active try to bring weight down - Lipid panel - Basic metabolic panel  3. Type 2 diabetes mellitus without complication, without long-term current use of insulin (HCC) She states she is trying to watch starches in her diet takes her medication she needs to check A1c await results - Hemoglobin A1c  4. Post-menopausal Bone density she is due - DG Bone  Density  5. Osteopenia, unspecified location Bone density she is due - DG Bone Density  6. Screening for lipid disorders History hyperlipidemia check lipid profile - Lipid panel  7. High risk medication use On multiple medicines check liver - Hepatic function panel

## 2019-10-31 ENCOUNTER — Other Ambulatory Visit: Payer: Self-pay | Admitting: Nurse Practitioner

## 2019-10-31 ENCOUNTER — Encounter: Payer: Self-pay | Admitting: Nurse Practitioner

## 2019-10-31 MED ORDER — QUINAPRIL HCL 20 MG PO TABS: ORAL_TABLET | ORAL | 1 refills | Status: DC

## 2019-10-31 MED ORDER — AMLODIPINE BESYLATE 2.5 MG PO TABS: ORAL_TABLET | ORAL | 1 refills | Status: DC

## 2019-10-31 MED ORDER — MIRTAZAPINE 15 MG PO TABS: 15.0000 mg | ORAL_TABLET | Freq: Every day | ORAL | 1 refills | Status: DC

## 2019-11-05 ENCOUNTER — Inpatient Hospital Stay (HOSPITAL_COMMUNITY): Admission: RE | Admit: 2019-11-05 | Payer: Medicare Other | Source: Ambulatory Visit

## 2019-11-14 ENCOUNTER — Other Ambulatory Visit: Payer: Self-pay | Admitting: Nurse Practitioner

## 2019-11-15 DIAGNOSIS — Z13220 Encounter for screening for lipoid disorders: Secondary | ICD-10-CM | POA: Diagnosis not present

## 2019-11-15 DIAGNOSIS — I10 Essential (primary) hypertension: Secondary | ICD-10-CM | POA: Diagnosis not present

## 2019-11-15 DIAGNOSIS — Z79899 Other long term (current) drug therapy: Secondary | ICD-10-CM | POA: Diagnosis not present

## 2019-11-15 DIAGNOSIS — E119 Type 2 diabetes mellitus without complications: Secondary | ICD-10-CM | POA: Diagnosis not present

## 2019-11-16 LAB — HEPATIC FUNCTION PANEL
ALT: 14 IU/L (ref 0–32)
AST: 15 IU/L (ref 0–40)
Albumin: 4.2 g/dL (ref 3.6–4.6)
Alkaline Phosphatase: 110 IU/L (ref 39–117)
Bilirubin Total: 0.5 mg/dL (ref 0.0–1.2)
Bilirubin, Direct: 0.12 mg/dL (ref 0.00–0.40)
Total Protein: 6.4 g/dL (ref 6.0–8.5)

## 2019-11-16 LAB — BASIC METABOLIC PANEL
BUN/Creatinine Ratio: 22 (ref 12–28)
BUN: 24 mg/dL (ref 8–27)
CO2: 25 mmol/L (ref 20–29)
Calcium: 9.9 mg/dL (ref 8.7–10.3)
Chloride: 101 mmol/L (ref 96–106)
Creatinine, Ser: 1.1 mg/dL — ABNORMAL HIGH (ref 0.57–1.00)
GFR calc Af Amer: 54 mL/min/{1.73_m2} — ABNORMAL LOW (ref >59)
GFR calc non Af Amer: 47 mL/min/{1.73_m2} — ABNORMAL LOW (ref >59)
Glucose: 147 mg/dL — ABNORMAL HIGH (ref 65–99)
Potassium: 5.3 mmol/L — ABNORMAL HIGH (ref 3.5–5.2)
Sodium: 141 mmol/L (ref 134–144)

## 2019-11-16 LAB — LIPID PANEL
Chol/HDL Ratio: 3 ratio (ref 0.0–4.4)
Cholesterol, Total: 196 mg/dL (ref 100–199)
HDL: 65 mg/dL (ref >39)
LDL Chol Calc (NIH): 114 mg/dL — ABNORMAL HIGH (ref 0–99)
Triglycerides: 94 mg/dL (ref 0–149)
VLDL Cholesterol Cal: 17 mg/dL (ref 5–40)

## 2019-11-16 LAB — HEMOGLOBIN A1C
Est. average glucose Bld gHb Est-mCnc: 143 mg/dL
Hgb A1c MFr Bld: 6.6 % — ABNORMAL HIGH (ref 4.8–5.6)

## 2019-11-19 ENCOUNTER — Ambulatory Visit (INDEPENDENT_AMBULATORY_CARE_PROVIDER_SITE_OTHER): Payer: Medicare Other | Admitting: Family Medicine

## 2019-11-19 ENCOUNTER — Other Ambulatory Visit: Payer: Self-pay

## 2019-11-19 DIAGNOSIS — E875 Hyperkalemia: Secondary | ICD-10-CM | POA: Diagnosis not present

## 2019-11-19 MED ORDER — TEMAZEPAM 30 MG PO CAPS: 30.0000 mg | ORAL_CAPSULE | Freq: Every evening | ORAL | 2 refills | Status: DC | PRN

## 2019-11-19 NOTE — Progress Notes (Signed)
   Subjective:    Patient ID: Deanna Salinas, female    DOB: [birth year], 84 y.o.   MRN: [id number] Video visit attempted and failed HPI Patient calls today for a follow up on her Insomnia.  Patient with severe insomnia She is trying trazodone she is tried Remeron she is also tried [place] and Ambien none of these have seemed to help for a while she was taking over-the-counter medications and alcohol she is no longer doing that Patient has been taking remeron for the last couple of weeks. Patient states she has not noticed any change with medication. She is getting 2-2.5 hours of sleep each night.    Virtual Visit via Video Note  I connected with Deanna Salinas on 11/19/19 at 10:00 AM EDT by a video enabled telemedicine application and verified that I am speaking with the correct person using two identifiers.  Location: Patient: phone Provider: office    I discussed the limitations of evaluation and management by telemedicine and the availability of in person appointments. The patient expressed understanding and agreed to proceed.  History of Present Illness:    Observations/Objective:   Assessment and Plan:   Follow Up Instructions:    I discussed the assessment and treatment plan with the patient. The patient was provided an opportunity to ask questions and all were answered. The patient agreed with the plan and demonstrated an understanding of the instructions.   The patient was advised to call back or seek an in-person evaluation if the symptoms wo20rsen or if the condition fails to improve as anticipated.  I provided 20 minutes of non-face-to-face time during this encounter.    Review of Systems  Constitutional: Negative for activity change, appetite change and fatigue.  HENT: Negative for congestion and rhinorrhea.   Respiratory: Negative for cough and shortness of breath.   Cardiovascular: Negative for chest pain and leg swelling.  Gastrointestinal: Negative for  abdominal pain and diarrhea.  Endocrine: Negative for polydipsia and polyphagia.  Skin: Negative for color change.  Neurological: Negative for dizziness and weakness.  Psychiatric/Behavioral: Negative for behavioral problems and confusion.       Objective:   Physical Exam  Unable to do physical exam      Assessment & Plan:  Virtual exam unable to do video component due to equipment failure Severe insomnia we will try temazepam 30 mg daily if this does not adequately help her then the next step would be consideration consulting with sleep specialist patient to do a follow-up update within 2 weeks and follow-up with [place] in person in 4 to 6 weeks

## 2019-12-03 ENCOUNTER — Telehealth: Payer: Self-pay | Admitting: *Deleted

## 2019-12-03 NOTE — Telephone Encounter (Signed)
Pt mailed in a sleeping record since taking temazepam 30mg . On the note she also wanted her order for bw mailed out again for her and I took care of that part. Paper is dr scott's folder to review.

## 2019-12-03 NOTE — Telephone Encounter (Signed)
error 

## 2019-12-06 ENCOUNTER — Telehealth: Payer: Self-pay | Admitting: Family Medicine

## 2019-12-06 ENCOUNTER — Other Ambulatory Visit: Payer: Self-pay | Admitting: *Deleted

## 2019-12-06 DIAGNOSIS — Z780 Asymptomatic menopausal state: Secondary | ICD-10-CM

## 2019-12-06 DIAGNOSIS — I10 Essential (primary) hypertension: Secondary | ICD-10-CM

## 2019-12-06 NOTE — Telephone Encounter (Signed)
Patient notified of results -- states med is really helping.

## 2019-12-06 NOTE — Telephone Encounter (Signed)
I would recommend the patient complete a metabolic 7 before her follow-up visit.  Very important for her to make sure she is well-hydrated when she gets this checked.  She should do this in early May

## 2019-12-06 NOTE — Telephone Encounter (Signed)
Pt has appt on 5/11 and would like to get lab work done.

## 2019-12-06 NOTE — Telephone Encounter (Signed)
Pt had bw on 11/15/19 a1c, bmp, lipid, liver and has an active order for bmp. Do you want to add anything else

## 2019-12-06 NOTE — Telephone Encounter (Signed)
Patient notified to get lab work done and to be well hydrated.

## 2019-12-06 NOTE — Telephone Encounter (Signed)
Please tell patient I received her letter.  The best I can tell the medication is helping some with her sleep.  It is best for the patient to continue the medicine as long as it is not causing her to be too drowsy.  She has a follow-up appointment in May we will discuss things further at that visit thank you

## 2019-12-18 ENCOUNTER — Telehealth: Payer: Self-pay | Admitting: *Deleted

## 2019-12-18 ENCOUNTER — Telehealth (INDEPENDENT_AMBULATORY_CARE_PROVIDER_SITE_OTHER): Payer: Medicare Other | Admitting: Family Medicine

## 2019-12-18 ENCOUNTER — Other Ambulatory Visit: Payer: Self-pay

## 2019-12-18 DIAGNOSIS — I10 Essential (primary) hypertension: Secondary | ICD-10-CM

## 2019-12-18 DIAGNOSIS — G4700 Insomnia, unspecified: Secondary | ICD-10-CM | POA: Diagnosis not present

## 2019-12-18 NOTE — Progress Notes (Addendum)
   Subjective:    Patient ID: Deanna Salinas, female    DOB: 1936/07/25, 84 y.o.   MRN: [id number]  Insomnia Primary symptoms: premature morning awakening, napping.  The current episode started more than one year. The problem has been gradually improving since onset.   Patient states she has been getting about 4 hours of sleep and usually wakes up around 4:30 AM, eats breakfast and goes back to sleep for a few hours.  She states she is feeling much better and has no complaints other than her knees bothering her.   Fall Risk  10/29/2019 05/17/2019 03/07/2018 07/14/2017 03/02/2016  Falls in the past year? 0 0 No No No  Comment - - - Emmi Telephone Survey: data to providers prior to load -  Risk for fall due to : - Impaired balance/gait;Impaired mobility - - -  Follow up - Falls evaluation completed - - -     Review of Systems  Psychiatric/Behavioral: The patient has insomnia.        Objective:   Physical Exam   Virtual Visit via Video Note  I connected with Deanna Salinas on 12/18/19 at 10:00 AM EDT by a video enabled telemedicine application and verified that I am speaking with the correct person using two identifiers.  Location: Patient: home Provider: office   I discussed the limitations of evaluation and management by telemedicine and the availability of in person appointments. The patient expressed understanding and agreed to proceed.  History of Present Illness:    Observations/Objective:   Assessment and Plan:   Follow Up Instructions:    I discussed the assessment and treatment plan with the patient. The patient was provided an opportunity to ask questions and all were answered. The patient agreed with the plan and demonstrated an understanding of the instructions.   The patient was advised to call back or seek an in-person evaluation if the symptoms worsen or if the condition fails to improve as anticipated.  I provided 20 minutes of non-face-to-face time  during this encounter.  Patient states blood pressure under good control when she checks it    Assessment & Plan:  Insomnia Doing well with the medication States overall she is feeling much improved Trying to maintain compliance with her medicines Refills were given In person visit later this summer

## 2019-12-18 NOTE — Telephone Encounter (Signed)
[name], gulbrandson are scheduled for a virtual visit with your provider today.    Just as we do with appointments in the office, we must obtain your consent to participate.  Your consent will be active for this visit and any virtual visit you may have with one of our providers in the next 365 days.    If you have a MyChart account, I can also send a copy of this consent to you electronically.  All virtual visits are billed to your insurance company just like a traditional visit in the office.  As this is a virtual visit, video technology does not allow for your provider to perform a traditional examination.  This may limit your provider's ability to fully assess your condition.  If your provider identifies any concerns that need to be evaluated in person or the need to arrange testing such as labs, EKG, etc, we will make arrangements to do so.    Although advances in technology are sophisticated, we cannot ensure that it will always work on either your end or our end.  If the connection with a video visit is poor, we may have to switch to a telephone visit.  With either a video or telephone visit, we are not always able to ensure that we have a secure connection.   I need to obtain your verbal consent now.   Are you willing to proceed with your visit today?   Deanna Salinas has provided verbal consent on 12/18/2019 for a virtual visit (video or telephone).   Patsy Lager, LPN [id number]  [id number] AM

## 2020-02-08 ENCOUNTER — Other Ambulatory Visit: Payer: Self-pay | Admitting: Family Medicine

## 2020-03-25 ENCOUNTER — Telehealth: Payer: Self-pay | Admitting: Cardiology

## 2020-03-25 ENCOUNTER — Telehealth: Payer: Self-pay | Admitting: Dermatology

## 2020-03-25 NOTE — Telephone Encounter (Signed)
New message    Pt c/o swelling: STAT is pt has developed SOB within 24 hours  1) How much weight have you gained and in what time span?  hasnt been weighing  2) If swelling, where is the swelling located? Both lower legs burning sensation  3) Are you currently taking a fluid pill?  Yes , but they are not helping  Are you currently SOB?  yes 4) Do you have a log of your daily weights (if so, list)? no  5) Have you gained 3 pounds in a day or 5 pounds in a week?  Doesn't know  6) Have you traveled recently?  no

## 2020-03-25 NOTE — Telephone Encounter (Signed)
I spoke with patient.We last saw her 2017 and she was not on any diuretic .She does not weigh self so it is unclear what her weight is.She reports that she saw Dr.Luking in [place] placed her on furosemide and potassium but she does not know the doses. I advised her to call [place] today to discuss this. She has an apt 05/07/20 with [facility].I advised her to go direct to the ED if SOB worsens.She spoke clearly ,in complete sentences, no SOB appreciated. She states she has has SOB and leg pain for the past 3 months and she has an apt with neurology for her leg pain.

## 2020-03-28 ENCOUNTER — Ambulatory Visit (INDEPENDENT_AMBULATORY_CARE_PROVIDER_SITE_OTHER): Payer: Medicare Other | Admitting: Student

## 2020-03-28 ENCOUNTER — Other Ambulatory Visit: Payer: Self-pay

## 2020-03-28 ENCOUNTER — Encounter: Payer: Self-pay | Admitting: Student

## 2020-03-28 VITALS — BP 124/70 | HR 103 | Ht 67.0 in | Wt 240.0 lb

## 2020-03-28 DIAGNOSIS — I10 Essential (primary) hypertension: Secondary | ICD-10-CM

## 2020-03-28 DIAGNOSIS — F5104 Psychophysiologic insomnia: Secondary | ICD-10-CM | POA: Diagnosis not present

## 2020-03-28 DIAGNOSIS — R600 Localized edema: Secondary | ICD-10-CM

## 2020-03-28 DIAGNOSIS — R000 Tachycardia, unspecified: Secondary | ICD-10-CM | POA: Diagnosis not present

## 2020-03-28 DIAGNOSIS — Z79899 Other long term (current) drug therapy: Secondary | ICD-10-CM

## 2020-03-28 DIAGNOSIS — R0600 Dyspnea, unspecified: Secondary | ICD-10-CM | POA: Diagnosis not present

## 2020-03-28 DIAGNOSIS — R0609 Other forms of dyspnea: Secondary | ICD-10-CM

## 2020-03-28 DIAGNOSIS — Z780 Asymptomatic menopausal state: Secondary | ICD-10-CM | POA: Diagnosis not present

## 2020-03-28 MED ORDER — TORSEMIDE 20 MG PO TABS: 20.0000 mg | ORAL_TABLET | Freq: Every day | ORAL | 3 refills | Status: DC

## 2020-03-28 NOTE — Progress Notes (Signed)
Cardiology Office Note    Date:  03/29/2020   ID:  Deanna Salinas, DOB Apr 15, 1936, MRN [id number]  PCP:  Kathyrn Drown, MD  Cardiologist: Rozann Lesches, MD    Chief Complaint  Patient presents with  . Leg Swelling    History of Present Illness:    Deanna Salinas is a 84 y.o. female with past medical history of HTN and Type 2 DM who presents to the office today for evaluation of lower extremity edema.   She was last examined by Dr. Domenic Polite in 05/2016 as a new-patient referral for preoperative cardiac clearance prior to upcoming knee replacement. She did report dyspnea on exertion and fatigue, therefore an echocardiogram and NST were recommended for further evaluation. Her echocardiogram showed a preserved EF of 60-65% with Grade 1 DD and a trivial pericardial effusion with no significant valve abnormalities. NST showed a small defect in the apical anterior location felt to represent breast tissue attenuation and no definitive ischemia. Was overall a low-risk study.   She called the office on 03/25/2020 reporting worsening lower extremity edema and a follow-up appointment was arranged.   In talking with the patient and her husband today, she reports having lower extremity edema for several years but feels like this has worsened over the past few months. Takes Lasix 20mg  as needed but does not notice much change in her symptoms. Has taken her husband's Torsemide and noticed improvement. She does not weigh herself regularly but this has increased by 10 pounds over the past year. She has baseline dyspnea on exertion but is not active due to chronic back pain and she requires spinal injections. She denies any specific orthopnea or PND. No recent chest pain or palpitations.  She has chronic insomnia and says she only sleeps for a few hours each night. She takes Restoril and Benadryl at night along with consuming gin.   Past Medical History:  Diagnosis Date  . Essential hypertension   .  Fatty liver   . Hyperlipidemia   . Type 2 diabetes mellitus ([place])     Past Surgical History:  Procedure Laterality Date  . APPENDECTOMY     At the time of hysterectomy  . CESAREAN SECTION     X5  . COLONOSCOPY    . COLONOSCOPY N/A 06/11/2013   Colonic diverticulosis, hyperplastic polyps. no further screening colonoscopies recommended  . ESOPHAGOGASTRODUODENOSCOPY N/A 02/20/2014   Procedure: ESOPHAGOGASTRODUODENOSCOPY (EGD);  Surgeon: Daneil Dolin, MD;  Location: [facility];  Service: Endoscopy;  Laterality: N/A;  11:00    Current Medications: Outpatient Medications Prior to Visit  Medication Sig Dispense Refill  . amLODipine (NORVASC) 2.5 MG tablet TAKE (1) TABLET BY MOUTH ONCE DAILY. 90 tablet 1  . BAYER CONTOUR NEXT TEST test strip USE AS DIRECTED 100 each 5  . diphenhydrAMINE HCl (BENADRYL ALLERGY PO) Take 1 tablet by mouth at bedtime.    . Omega-3 Fatty Acids (FISH OIL PO) Take 1 capsule by mouth daily.     Marland Kitchen OVER THE COUNTER MEDICATION Take 1 tablet by mouth daily. Vit d 3    . OVER THE COUNTER MEDICATION CBD From [employer]. 1/2 off a dropper once daily sublingual.    . OVER THE COUNTER MEDICATION Melatonin at bedtime.    . Probiotic Product (PROBIOTIC DAILY PO) Take 1 tablet by mouth daily.    . quinapril (ACCUPRIL) 20 MG tablet TAKE (1) TABLET BY MOUTH ONCE DAILY. 90 tablet 0  . temazepam (RESTORIL) 30 MG  capsule TAKE 1 CAPSULE BY MOUTH AT BEDTIME AS NEEDED FOR SLEEP. 30 capsule 2  . TURMERIC PO Take 1 capsule by mouth daily.     . furosemide (LASIX) 20 MG tablet Take 20 mg by mouth as needed for fluid or edema.    Marland Kitchen glucose blood test strip Use as instructed 50 each 5   No facility-administered medications prior to visit.     Allergies:   Ambien [zolpidem tartrate], Lipitor [atorvastatin], Metformin and related, Pravastatin, and Trazodone and nefazodone   Social History   Socioeconomic History  . Marital status: Married    Spouse name: Not on file  . Number of  children: 5  . Years of education: Not on file  . Highest education level: Not on file  Occupational History    Employer: RETIRED  Tobacco Use  . Smoking status: Never Smoker  . Smokeless tobacco: Never Used  Vaping Use  . Vaping Use: Never used  Substance and Sexual Activity  . Alcohol use: Yes    Alcohol/week: 3.0 standard drinks    Types: 3 Glasses of wine per week    Comment: occasional  . Drug use: No  . Sexual activity: Not on file  Other Topics Concern  . Not on file  Social History Narrative  . Not on file   Social Determinants of Health   Financial Resource Strain:   . Difficulty of Paying Living Expenses: Not on file  Food Insecurity:   . Worried About Charity fundraiser in the Last Year: Not on file  . Ran Out of Food in the Last Year: Not on file  Transportation Needs:   . Lack of Transportation (Medical): Not on file  . Lack of Transportation (Non-Medical): Not on file  Physical Activity:   . Days of Exercise per Week: Not on file  . Minutes of Exercise per Session: Not on file  Stress:   . Feeling of Stress : Not on file  Social Connections:   . Frequency of Communication with Friends and Family: Not on file  . Frequency of Social Gatherings with Friends and Family: Not on file  . Attends Religious Services: Not on file  . Active Member of Clubs or Organizations: Not on file  . Attends Archivist Meetings: Not on file  . Marital Status: Not on file     Family History:  The patient's family history includes Cancer in her daughter and mother; Heart Problems in her father; Hypertension in some other family members.   Review of Systems:   Please see the history of present illness.     General:  No chills, fever, night sweats or weight changes.  Cardiovascular:  No chest pain, orthopnea, palpitations, paroxysmal nocturnal dyspnea. Positive for dyspnea on exertion and edema.  Dermatological: No rash, lesions/masses Respiratory: No cough,  dyspnea Urologic: No hematuria, dysuria Abdominal:   No nausea, vomiting, diarrhea, bright red blood per rectum, melena, or hematemesis Neurologic:  No visual changes, wkns, changes in mental status. All other systems reviewed and are otherwise negative except as noted above.   Physical Exam:    VS:  BP 124/70   Pulse (!) 103   Ht 5\' 7"  (1.702 m)   Wt 240 lb (108.9 kg)   SpO2 98%   BMI 37.59 kg/m    General: Elderly, well nourished,female appearing in no acute distress. Head: Normocephalic, atraumatic. Neck: No carotid bruits. JVD not elevated.  Lungs: Respirations regular and unlabored, without wheezes or rales.  Heart: Regular rate and rhythm. No S3 or S4.  No murmur, no rubs, or gallops appreciated. Abdomen: Appears non-distended. No obvious abdominal masses. Msk:  Strength and tone appear normal for age. No obvious joint deformities or effusions. Extremities: No clubbing or cyanosis. 1+ pitting edema bilaterally.  Distal pedal pulses are 2+ bilaterally. Neuro: Alert and oriented X 3. Moves all extremities spontaneously. No focal deficits noted. Psych:  Responds to questions appropriately with a normal affect. Skin: No rashes or lesions noted  Wt Readings from Last 3 Encounters:  03/28/20 240 lb (108.9 kg)  10/19/18 232 lb (105.2 kg)  06/26/18 232 lb (105.2 kg)     Studies/Labs Reviewed:   EKG:  EKG is ordered today.  The ekg ordered today demonstrates sinus tachycardia, HR 103 with no acute ST abnormalities when compared to prior tracings.   Recent Labs: 11/15/2019: ALT 14 03/28/2020: BUN 21; Creatinine, Ser 0.77; Potassium 5.0; Sodium 142   Lipid Panel    Component Value Date/Time   CHOL 196 11/15/2019 0807   TRIG 94 11/15/2019 0807   HDL 65 11/15/2019 0807   CHOLHDL 3.0 11/15/2019 0807   CHOLHDL 4.2 09/02/2014 1125   VLDL 31 09/02/2014 1125   LDLCALC 114 (H) 11/15/2019 0807    Additional studies/ records that were reviewed today include:   NST:  06/2016  Blood pressure demonstrated a normal response to exercise.  There was no ST segment deviation noted during stress.  Defect 1: There is a small defect of mild severity present in the apical anterior location. Probable soft tissue attenuation (breast)  The left ventricular ejection fraction is mildly decreased (45-54%).  This is a low risk study.  Consider echo to fully evaluate LV systolic function  Echocardiogram: 06/2016 Study Conclusions   - Left ventricle: The cavity size was normal. Systolic function was  normal. The estimated ejection fraction was in the range of 60%  to 65%. Doppler parameters are consistent with abnormal left  ventricular relaxation (grade 1 diastolic dysfunction).  - Pericardium, extracardiac: A trivial pericardial effusion was  identified.   Assessment:    1. Dyspnea on exertion   2. Bilateral lower extremity edema   3. Medication management   4. Sinus tachycardia   5. Essential hypertension   6. Chronic insomnia      Plan:   In order of problems listed above:  1. Dyspnea on Exertion/ Edema - Symptoms have been occurring for several years but have progressed over the past few months. She is not active at baseline secondary to back pain so deconditioning could certainly be playing a role as well. Will plan to obtain an echocardiogram to assess LV function and wall motion. She does have 1+ pitting edema and denies any improvement with Lasix. Will stop Lasix and switch to Torsemide 20mg  daily for improved bioavailability. Recheck BMET in 2 weeks. She was also encouraged to elevate her lower extremities and limit sodium intake. Unable to wear compression stockings.   2. Sinus Tachycardia - HR was initially recorded as 103 bpm, rechecked during examination and improved to 84. She does report a history of a high resting HR and is mostly asymptomatic with this. Does report rare palpitations. Will plan to obtain a repeat echocardiogram as  outlined above. If EF reduced, would plan to start a BB.   3. HTN - BP is well-controlled at 124/70 during today's visit. Continue current medication regimen with Amlodipine 2.5mg  daily and Quinapril 20mg  daily.   4. Insomnia - Followed by  her PCP. Reports only sleeping a few hours each night. She takes Restoril and Benadryl at night along with consuming gin. Advised against consuming alcohol while taking sleeping aids.    Medication Adjustments/Labs and Tests Ordered: Current medicines are reviewed at length with the patient today.  Concerns regarding medicines are outlined above.  Medication changes, Labs and Tests ordered today are listed in the Patient Instructions below. Patient Instructions  Medication Instructions:  Your physician has recommended you make the following change in your medication:   Stop Taking Lasix  Start Torsemide 20 mg Daily   *If you need a refill on your cardiac medications before your next appointment, please call your pharmacy*   Lab Work: Your physician recommends that you return for lab work in: 2 weeks (04/11/20)   If you have labs (blood work) drawn today and your tests are completely normal, you will receive your results only by: Marland Kitchen MyChart Message (if you have MyChart) OR . A paper copy in the mail If you have any lab test that is abnormal or we need to change your treatment, we will call you to review the results.   Testing/Procedures: Your physician has requested that you have an echocardiogram. Echocardiography is a painless test that uses sound waves to create images of your heart. It provides your doctor with information about the size and shape of your heart and how well your heart's chambers and valves are working. This procedure takes approximately one hour. There are no restrictions for this procedure.     Follow-Up: At [facility], you and your health needs are our priority.  As part of our continuing mission to provide you with  exceptional heart care, we have created designated Provider Care Teams.  These Care Teams include your primary Cardiologist (physician) and Advanced Practice Providers (APPs -  Physician Assistants and Nurse Practitioners) who all work together to provide you with the care you need, when you need it.  We recommend signing up for the patient portal called "MyChart".  Sign up information is provided on this After Visit Summary.  MyChart is used to connect with patients for Virtual Visits (Telemedicine).  Patients are able to view lab/test results, encounter notes, upcoming appointments, etc.  Non-urgent messages can be sent to your provider as well.   To learn more about what you can do with MyChart, go to [email or web address].    Your next appointment:   2 month(s)  The format for your next appointment:   In Person  Provider:   Rozann Lesches, MD or Bernerd Pho, PA-C   Other Instructions Thank you for choosing [facility]!       Signed, Erma Heritage, PA-C  03/29/2020 9:14 AM    [facility] [address] [place], [place] [address] Phone: [phone] Fax: [phone]

## 2020-03-28 NOTE — Patient Instructions (Signed)
Medication Instructions:  Your physician has recommended you make the following change in your medication:   Stop Taking Lasix  Start Torsemide 20 mg Daily   *If you need a refill on your cardiac medications before your next appointment, please call your pharmacy*   Lab Work: Your physician recommends that you return for lab work in: 2 weeks (04/11/20)   If you have labs (blood work) drawn today and your tests are completely normal, you will receive your results only by: Marland Kitchen MyChart Message (if you have MyChart) OR . A paper copy in the mail If you have any lab test that is abnormal or we need to change your treatment, we will call you to review the results.   Testing/Procedures: Your physician has requested that you have an echocardiogram. Echocardiography is a painless test that uses sound waves to create images of your heart. It provides your doctor with information about the size and shape of your heart and how well your heart's chambers and valves are working. This procedure takes approximately one hour. There are no restrictions for this procedure.     Follow-Up: At [facility], you and your health needs are our priority.  As part of our continuing mission to provide you with exceptional heart care, we have created designated Provider Care Teams.  These Care Teams include your primary Cardiologist (physician) and Advanced Practice Providers (APPs -  Physician Assistants and Nurse Practitioners) who all work together to provide you with the care you need, when you need it.  We recommend signing up for the patient portal called "MyChart".  Sign up information is provided on this After Visit Summary.  MyChart is used to connect with patients for Virtual Visits (Telemedicine).  Patients are able to view lab/test results, encounter notes, upcoming appointments, etc.  Non-urgent messages can be sent to your provider as well.   To learn more about what you can do with MyChart, go to  [email or web address].    Your next appointment:   2 month(s)  The format for your next appointment:   In Person  Provider:   Rozann Lesches, MD or Bernerd Pho, PA-C   Other Instructions Thank you for choosing [facility]!

## 2020-03-29 ENCOUNTER — Encounter: Payer: Self-pay | Admitting: Student

## 2020-03-29 LAB — BASIC METABOLIC PANEL
BUN/Creatinine Ratio: 27 (ref 12–28)
BUN: 21 mg/dL (ref 8–27)
CO2: 25 mmol/L (ref 20–29)
Calcium: 9.5 mg/dL (ref 8.7–10.3)
Chloride: 102 mmol/L (ref 96–106)
Creatinine, Ser: 0.77 mg/dL (ref 0.57–1.00)
GFR calc Af Amer: 83 mL/min/{1.73_m2} (ref >59)
GFR calc non Af Amer: 72 mL/min/{1.73_m2} (ref >59)
Glucose: 106 mg/dL — ABNORMAL HIGH (ref 65–99)
Potassium: 5 mmol/L (ref 3.5–5.2)
Sodium: 142 mmol/L (ref 134–144)

## 2020-03-31 ENCOUNTER — Ambulatory Visit (HOSPITAL_COMMUNITY)
Admission: RE | Admit: 2020-03-31 | Discharge: 2020-03-31 | Disposition: A | Discharge status: Home / Self Care | Payer: Medicare Other | Source: Ambulatory Visit | Attending: Family Medicine | Admitting: Family Medicine

## 2020-03-31 DIAGNOSIS — R0600 Dyspnea, unspecified: Secondary | ICD-10-CM | POA: Diagnosis not present

## 2020-03-31 DIAGNOSIS — R600 Localized edema: Secondary | ICD-10-CM | POA: Insufficient documentation

## 2020-03-31 LAB — ECHOCARDIOGRAM COMPLETE
AR max vel: 2.02 cm2
AV Area VTI: 2.23 cm2
AV Area mean vel: 2.11 cm2
AV Mean grad: 4.1 mmHg
AV Peak grad: 7.5 mmHg
Ao pk vel: 1.37 m/s
Area-P 1/2: 4.21 cm2
S' Lateral: 2.31 cm

## 2020-03-31 NOTE — Progress Notes (Signed)
*  PRELIMINARY RESULTS* Echocardiogram 2D Echocardiogram has been performed.  Samuel Germany 03/31/2020, 12:19 PM

## 2020-04-01 DIAGNOSIS — M48062 Spinal stenosis, lumbar region with neurogenic claudication: Secondary | ICD-10-CM | POA: Diagnosis not present

## 2020-04-01 DIAGNOSIS — I10 Essential (primary) hypertension: Secondary | ICD-10-CM | POA: Diagnosis not present

## 2020-04-01 DIAGNOSIS — Z6836 Body mass index (BMI) 36.0-36.9, adult: Secondary | ICD-10-CM | POA: Diagnosis not present

## 2020-04-01 DIAGNOSIS — M5416 Radiculopathy, lumbar region: Secondary | ICD-10-CM | POA: Diagnosis not present

## 2020-04-10 ENCOUNTER — Encounter: Payer: Self-pay | Admitting: *Deleted

## 2020-05-03 ENCOUNTER — Other Ambulatory Visit: Payer: Self-pay | Admitting: Family Medicine

## 2020-05-07 ENCOUNTER — Ambulatory Visit: Payer: Medicare Other | Admitting: Student

## 2020-05-21 NOTE — Telephone Encounter (Signed)
error 

## 2020-05-22 ENCOUNTER — Ambulatory Visit: Payer: Medicare Other | Attending: Internal Medicine

## 2020-05-22 DIAGNOSIS — Z23 Encounter for immunization: Secondary | ICD-10-CM

## 2020-05-22 NOTE — Progress Notes (Signed)
   Covid-19 Vaccination Clinic  Name:  Deanna Salinas    MRN: [id number] DOB: 25-Aug-1935  05/22/2020  [name] was observed post Covid-19 immunization for 15 minutes without incident. She was provided with Vaccine Information Sheet and instruction to access the V-Safe system.   [name] was instructed to call 911 with any severe reactions post vaccine: Marland Kitchen Difficulty breathing  . Swelling of face and throat  . A fast heartbeat  . A bad rash all over body  . Dizziness and weakness

## 2020-05-26 ENCOUNTER — Other Ambulatory Visit: Payer: Self-pay | Admitting: Nurse Practitioner

## 2020-05-26 NOTE — Telephone Encounter (Signed)
Patient has appointment on 11/15

## 2020-06-04 ENCOUNTER — Observation Stay (HOSPITAL_COMMUNITY)
Admission: EM | Admit: 2020-06-04 | Discharge: 2020-06-05 | Disposition: A | Discharge status: Home / Self Care | Payer: Medicare Other | Attending: Internal Medicine | Admitting: Internal Medicine

## 2020-06-04 ENCOUNTER — Emergency Department (HOSPITAL_COMMUNITY): Payer: Medicare Other

## 2020-06-04 ENCOUNTER — Other Ambulatory Visit: Payer: Self-pay

## 2020-06-04 ENCOUNTER — Encounter (HOSPITAL_COMMUNITY): Payer: Self-pay

## 2020-06-04 ENCOUNTER — Ambulatory Visit (INDEPENDENT_AMBULATORY_CARE_PROVIDER_SITE_OTHER): Payer: Medicare Other | Admitting: Family Medicine

## 2020-06-04 ENCOUNTER — Encounter: Payer: Self-pay | Admitting: Family Medicine

## 2020-06-04 VITALS — Temp 99.4°F

## 2020-06-04 DIAGNOSIS — Z79899 Other long term (current) drug therapy: Secondary | ICD-10-CM | POA: Insufficient documentation

## 2020-06-04 DIAGNOSIS — R059 Cough, unspecified: Secondary | ICD-10-CM | POA: Diagnosis not present

## 2020-06-04 DIAGNOSIS — E119 Type 2 diabetes mellitus without complications: Secondary | ICD-10-CM

## 2020-06-04 DIAGNOSIS — E038 Other specified hypothyroidism: Secondary | ICD-10-CM | POA: Diagnosis present

## 2020-06-04 DIAGNOSIS — R062 Wheezing: Secondary | ICD-10-CM

## 2020-06-04 DIAGNOSIS — R0902 Hypoxemia: Secondary | ICD-10-CM | POA: Insufficient documentation

## 2020-06-04 DIAGNOSIS — Z23 Encounter for immunization: Secondary | ICD-10-CM | POA: Diagnosis not present

## 2020-06-04 DIAGNOSIS — R0602 Shortness of breath: Secondary | ICD-10-CM | POA: Insufficient documentation

## 2020-06-04 DIAGNOSIS — I10 Essential (primary) hypertension: Secondary | ICD-10-CM | POA: Diagnosis present

## 2020-06-04 DIAGNOSIS — J40 Bronchitis, not specified as acute or chronic: Secondary | ICD-10-CM

## 2020-06-04 DIAGNOSIS — J9811 Atelectasis: Secondary | ICD-10-CM | POA: Diagnosis not present

## 2020-06-04 DIAGNOSIS — R0603 Acute respiratory distress: Secondary | ICD-10-CM | POA: Insufficient documentation

## 2020-06-04 DIAGNOSIS — Z20822 Contact with and (suspected) exposure to covid-19: Secondary | ICD-10-CM | POA: Diagnosis not present

## 2020-06-04 DIAGNOSIS — J45909 Unspecified asthma, uncomplicated: Secondary | ICD-10-CM | POA: Diagnosis present

## 2020-06-04 DIAGNOSIS — E039 Hypothyroidism, unspecified: Secondary | ICD-10-CM | POA: Diagnosis not present

## 2020-06-04 LAB — CBC WITH DIFFERENTIAL/PLATELET
Abs Immature Granulocytes: 0.03 10*3/uL (ref 0.00–0.07)
Basophils Absolute: 0.1 10*3/uL (ref 0.0–0.1)
Basophils Relative: 1 %
Eosinophils Absolute: 0.4 10*3/uL (ref 0.0–0.5)
Eosinophils Relative: 4 %
HCT: 41.4 % (ref 36.0–46.0)
Hemoglobin: 13.1 g/dL (ref 12.0–15.0)
Immature Granulocytes: 0 %
Lymphocytes Relative: 16 %
Lymphs Abs: 1.6 10*3/uL (ref 0.7–4.0)
MCH: 32.5 pg (ref 26.0–34.0)
MCHC: 31.6 g/dL (ref 30.0–36.0)
MCV: 102.7 fL — ABNORMAL HIGH (ref 80.0–100.0)
Monocytes Absolute: 1.2 10*3/uL — ABNORMAL HIGH (ref 0.1–1.0)
Monocytes Relative: 12 %
Neutro Abs: 6.9 10*3/uL (ref 1.7–7.7)
Neutrophils Relative %: 67 %
Platelets: 207 10*3/uL (ref 150–400)
RBC: 4.03 MIL/uL (ref 3.87–5.11)
RDW: 12.9 % (ref 11.5–15.5)
WBC: 10.2 10*3/uL (ref 4.0–10.5)
nRBC: 0 % (ref 0.0–0.2)

## 2020-06-04 LAB — BRAIN NATRIURETIC PEPTIDE: B Natriuretic Peptide: 166 pg/mL — ABNORMAL HIGH (ref 0.0–100.0)

## 2020-06-04 LAB — RESPIRATORY PANEL BY RT PCR (FLU A&B, COVID)
Influenza A by PCR: NEGATIVE
Influenza B by PCR: NEGATIVE
SARS Coronavirus 2 by RT PCR: NEGATIVE

## 2020-06-04 LAB — BASIC METABOLIC PANEL
Anion gap: 8 (ref 5–15)
BUN: 16 mg/dL (ref 8–23)
CO2: 29 mmol/L (ref 22–32)
Calcium: 9.2 mg/dL (ref 8.9–10.3)
Chloride: 98 mmol/L (ref 98–111)
Creatinine, Ser: 0.76 mg/dL (ref 0.44–1.00)
GFR, Estimated: >60 mL/min (ref >60)
Glucose, Bld: 124 mg/dL — ABNORMAL HIGH (ref 70–99)
Potassium: 4.1 mmol/L (ref 3.5–5.1)
Sodium: 135 mmol/L (ref 135–145)

## 2020-06-04 LAB — GLUCOSE, CAPILLARY: Glucose-Capillary: 238 mg/dL — ABNORMAL HIGH (ref 70–99)

## 2020-06-04 MED ORDER — IPRATROPIUM-ALBUTEROL 20-100 MCG/ACT IN AERS
1.0000 | INHALATION_SPRAY | Freq: Four times a day (QID) | RESPIRATORY_TRACT | Status: DC
  Administered 2020-06-04 – 2020-06-05 (×3): 1 via RESPIRATORY_TRACT

## 2020-06-04 MED ORDER — IPRATROPIUM-ALBUTEROL 20-100 MCG/ACT IN AERS
2.0000 | INHALATION_SPRAY | Freq: Four times a day (QID) | RESPIRATORY_TRACT | Status: DC
  Administered 2020-06-04: 2 via RESPIRATORY_TRACT
  Filled 2020-06-04: qty 4

## 2020-06-04 MED ORDER — INSULIN ASPART 100 UNIT/ML ~~LOC~~ SOLN
0.0000 [IU] | Freq: Every day | SUBCUTANEOUS | Status: DC
  Administered 2020-06-04: 3 [IU] via SUBCUTANEOUS

## 2020-06-04 MED ORDER — AMLODIPINE BESYLATE 5 MG PO TABS
2.5000 mg | ORAL_TABLET | Freq: Every day | ORAL | Status: DC
  Administered 2020-06-04 – 2020-06-05 (×2): 2.5 mg via ORAL
  Filled 2020-06-04 (×2): qty 1

## 2020-06-04 MED ORDER — INSULIN ASPART 100 UNIT/ML ~~LOC~~ SOLN
0.0000 [IU] | Freq: Three times a day (TID) | SUBCUTANEOUS | Status: DC
  Administered 2020-06-05 (×2): 3 [IU] via SUBCUTANEOUS

## 2020-06-04 MED ORDER — TEMAZEPAM 15 MG PO CAPS: 30.0000 mg | ORAL_CAPSULE | Freq: Every evening | ORAL | Status: DC | PRN

## 2020-06-04 MED ORDER — HYDROCOD POLST-CPM POLST ER 10-8 MG/5ML PO SUER: 5.0000 mL | Freq: Two times a day (BID) | ORAL | Status: DC | PRN

## 2020-06-04 MED ORDER — GUAIFENESIN ER 600 MG PO TB12
600.0000 mg | ORAL_TABLET | Freq: Two times a day (BID) | ORAL | Status: DC
  Administered 2020-06-04 – 2020-06-05 (×2): 600 mg via ORAL
  Filled 2020-06-04 (×2): qty 1

## 2020-06-04 MED ORDER — IPRATROPIUM BROMIDE HFA 17 MCG/ACT IN AERS: 2.0000 | INHALATION_SPRAY | Freq: Once | RESPIRATORY_TRACT | Status: DC

## 2020-06-04 MED ORDER — SODIUM CHLORIDE 0.9 % IV SOLN: 500.0000 mg | INTRAVENOUS | Status: DC

## 2020-06-04 MED ORDER — MAGNESIUM SULFATE 2 GM/50ML IV SOLN
2.0000 g | Freq: Once | INTRAVENOUS | Status: AC
  Administered 2020-06-04: 2 g via INTRAVENOUS
  Filled 2020-06-04: qty 50

## 2020-06-04 MED ORDER — IPRATROPIUM-ALBUTEROL 0.5-2.5 (3) MG/3ML IN SOLN
3.0000 mL | Freq: Four times a day (QID) | RESPIRATORY_TRACT | Status: DC
  Filled 2020-06-04: qty 3

## 2020-06-04 MED ORDER — INFLUENZA VAC A&B SA ADJ QUAD 0.5 ML IM PRSY
0.5000 mL | PREFILLED_SYRINGE | INTRAMUSCULAR | Status: AC
  Administered 2020-06-05: 0.5 mL via INTRAMUSCULAR
  Filled 2020-06-04: qty 0.5

## 2020-06-04 MED ORDER — QUINAPRIL HCL 10 MG PO TABS
20.0000 mg | ORAL_TABLET | Freq: Every day | ORAL | Status: DC
  Filled 2020-06-04 (×3): qty 2

## 2020-06-04 MED ORDER — IPRATROPIUM-ALBUTEROL 20-100 MCG/ACT IN AERS: 2.0000 | INHALATION_SPRAY | RESPIRATORY_TRACT | Status: DC

## 2020-06-04 MED ORDER — ALBUTEROL SULFATE HFA 108 (90 BASE) MCG/ACT IN AERS: 4.0000 | INHALATION_SPRAY | Freq: Once | RESPIRATORY_TRACT | Status: DC

## 2020-06-04 MED ORDER — OMEGA-3-ACID ETHYL ESTERS 1 G PO CAPS
1.0000 g | ORAL_CAPSULE | Freq: Two times a day (BID) | ORAL | Status: DC
  Administered 2020-06-04 – 2020-06-05 (×2): 1 g via ORAL
  Filled 2020-06-04 (×2): qty 1

## 2020-06-04 MED ORDER — ALBUTEROL SULFATE HFA 108 (90 BASE) MCG/ACT IN AERS
6.0000 | INHALATION_SPRAY | Freq: Once | RESPIRATORY_TRACT | Status: AC
  Administered 2020-06-04: 6 via RESPIRATORY_TRACT
  Filled 2020-06-04: qty 6.7

## 2020-06-04 MED ORDER — METHYLPREDNISOLONE SODIUM SUCC 40 MG IJ SOLR
40.0000 mg | Freq: Three times a day (TID) | INTRAMUSCULAR | Status: DC
  Administered 2020-06-04 – 2020-06-05 (×2): 40 mg via INTRAVENOUS
  Filled 2020-06-04 (×3): qty 1

## 2020-06-04 MED ORDER — METHYLPREDNISOLONE SODIUM SUCC 125 MG IJ SOLR
125.0000 mg | Freq: Once | INTRAMUSCULAR | Status: AC
  Administered 2020-06-04: 125 mg via INTRAVENOUS
  Filled 2020-06-04: qty 2

## 2020-06-04 MED ORDER — BENZONATATE 100 MG PO CAPS: 200.0000 mg | ORAL_CAPSULE | Freq: Once | ORAL | Status: DC

## 2020-06-04 MED ORDER — ENOXAPARIN SODIUM 40 MG/0.4ML ~~LOC~~ SOLN
40.0000 mg | SUBCUTANEOUS | Status: DC
  Administered 2020-06-05: 40 mg via SUBCUTANEOUS
  Filled 2020-06-04: qty 0.4

## 2020-06-04 MED ORDER — GUAIFENESIN-DM 100-10 MG/5ML PO SYRP: 5.0000 mL | ORAL_SOLUTION | ORAL | Status: DC | PRN

## 2020-06-04 MED ORDER — BENZONATATE 100 MG PO CAPS
200.0000 mg | ORAL_CAPSULE | Freq: Once | ORAL | Status: AC
  Administered 2020-06-04: 200 mg via ORAL
  Filled 2020-06-04: qty 2

## 2020-06-04 NOTE — H&P (Signed)
TRH H&P   Patient Demographics:    Deanna Salinas, is a 84 y.o. female  MRN: [id number]   DOB - February 20, 1936  Admit Date - 06/04/2020  Outpatient Primary MD for the patient is Kathyrn Drown, MD  Referring MD/NP/PA: Conception Chancy  Patient coming from: Home  Chief Complaint  Patient presents with  . Cough      HPI:    Deanna Salinas  is a 84 y.o. female, with past medical history of diabetes mellitus type 2 (not on any medication reports her most recent A1c was 6.5), hyperlipidemia, fatty liver, hypertension, HF, morbid obesity, degenerative disc disease, osteoarthritis, chronic bilateral lower extremity edema, she has not taken her Lasix for last 3 days, patient presents to ED secondary to complaints of cough started last Monday, and some wheezing, increased work of breathing as well, cough with a productive of phlegm, she denies any sick exposure, she denies any history of COPD, asthma or smoking in the past, no fever, no chills, no hemoptysis. - in ED patient was noted to be hypoxic 86% on room air, where she has been requiring 2 L oxygen via nasal cannula, no acute infectious process, she is COVID-19 negative, she had diffuse wheezing, she was given Solu-Medrol by EMS, and received nebulizer x3 in ED, given her wheezing and oxygen requirement triage hospitalist consulted to admit    Review of systems:    In addition to the HPI above,  No Fever-chills, reports poor appetite, fatigue No Headache, No changes with Vision or hearing, No problems swallowing food or Liquids, does report congestion No Chest pain, reports cough and shortness of breath No Abdominal pain, No Nausea or Vommitting, Bowel movements are regular, No Blood in stool or Urine, No dysuria, No new skin rashes or bruises, No new joints pains-aches,  No new weakness, tingling, numbness in any extremity, No recent  weight gain or loss, No polyuria, polydypsia or polyphagia, No significant Mental Stressors.  A full 10 point Review of Systems was done, except as stated above, all other Review of Systems were negative.   With Past History of the following :    Past Medical History:  Diagnosis Date  . Essential hypertension   . Fatty liver   . Hyperlipidemia   . Type 2 diabetes mellitus ([place])       Past Surgical History:  Procedure Laterality Date  . APPENDECTOMY     At the time of hysterectomy  . CESAREAN SECTION     X5  . COLONOSCOPY    . COLONOSCOPY N/A 06/11/2013   Colonic diverticulosis, hyperplastic polyps. no further screening colonoscopies recommended  . ESOPHAGOGASTRODUODENOSCOPY N/A 02/20/2014   Procedure: ESOPHAGOGASTRODUODENOSCOPY (EGD);  Surgeon: Daneil Dolin, MD;  Location: [facility];  Service: Endoscopy;  Laterality: N/A;  11:00      Social History:     Social History   Tobacco Use  .  Smoking status: Never Smoker  . Smokeless tobacco: Never Used  Substance Use Topics  . Alcohol use: Yes    Alcohol/week: 3.0 standard drinks    Types: 3 Glasses of wine per week    Comment: occasional       Family History :     Family History  Problem Relation Age of Onset  . Heart Problems Father   . Hypertension Other   . Hypertension Other   . Cancer Mother   . Cancer Daughter        breast   . Colon cancer Neg Hx      Home Medications:   Prior to Admission medications   Medication Sig Start Date End Date Taking? Authorizing Provider  amLODipine (NORVASC) 2.5 MG tablet TAKE (1) TABLET BY MOUTH ONCE DAILY. Patient taking differently: Take 2.5 mg by mouth daily.  10/31/19  Yes Nilda Simmer, NP  Omega-3 Fatty Acids (FISH OIL PO) Take 1 capsule by mouth daily.    Yes [provider]  quinapril (ACCUPRIL) 20 MG tablet TAKE (1) TABLET BY MOUTH ONCE DAILY. Patient taking differently: Take 20 mg by mouth daily.  05/27/20  Yes Luking, Scott A, MD    temazepam (RESTORIL) 30 MG capsule TAKE 1 CAPSULE BY MOUTH AT BEDTIME AS NEEDED FOR SLEEP. Patient taking differently: Take 30 mg by mouth at bedtime as needed for sleep.  05/06/20  Yes Kathyrn Drown, MD  torsemide (DEMADEX) 20 MG tablet Take 1 tablet (20 mg total) by mouth daily. 03/28/20 06/26/20 Yes Strader, Fransisco Hertz, PA-C  TURMERIC PO Take 1 capsule by mouth daily.    Yes [provider]  BAYER CONTOUR NEXT TEST test strip USE AS DIRECTED 07/28/16   Kathyrn Drown, MD     Allergies:     Allergies  Allergen Reactions  . Ambien [Zolpidem Tartrate]     Side effects   . Lipitor [Atorvastatin]     Muscle and joint pain  . Metformin And Related     Reported side effects headaches insomnia nausea  . Pravastatin     Muscle and joint pain  . Trazodone And Nefazodone     Side effects     Physical Exam:   Vitals  Blood pressure 132/62, pulse 88, temperature 98.8 F (37.1 C), temperature source Oral, resp. rate 15, height 5\' 5"  (1.651 m), weight 104.3 kg, SpO2 90 %.   1. General developed female, laying in bed, no apparent distress  2. Normal affect and insight, Not Suicidal or Homicidal, Awake Alert, Oriented X 3.  3. No F.N deficits, ALL C.Nerves Intact, Strength 5/5 all 4 extremities, Sensation intact all 4 extremities, Plantars down going.  4. Ears and Eyes appear Normal, Conjunctivae clear, PERRLA. Moist Oral Mucosa.  5. Supple Neck, No JVD, No cervical lymphadenopathy appriciated, No Carotid Bruits.  6. Symmetrical Chest wall movement, Good air movement bilaterally, diffuse wheezing bilaterally  7. RRR, No Gallops, Rubs or Murmurs, No Parasternal Heave.  Trace edema at baseline, chronic  8. Positive Bowel Sounds, Abdomen Soft, No tenderness, No organomegaly appriciated,No rebound -guarding or rigidity.  9.  No Cyanosis, Normal Skin Turgor, No Skin Rash or Bruise.  10. Good muscle tone,  joints appear normal , no effusions, Normal ROM.  11. No Palpable  Lymph Nodes in Neck or Axillae    Data Review:    CBC Recent Labs  Lab 06/04/20 1225  WBC 10.2  HGB 13.1  HCT 41.4  PLT 207  MCV  102.7*  MCH 32.5  MCHC 31.6  RDW 12.9  LYMPHSABS 1.6  MONOABS 1.2*  EOSABS 0.4  BASOSABS 0.1   ------------------------------------------------------------------------------------------------------------------  Chemistries  Recent Labs  Lab 06/04/20 1225  NA 135  K 4.1  CL 98  CO2 29  GLUCOSE 124*  BUN 16  CREATININE 0.76  CALCIUM 9.2   ------------------------------------------------------------------------------------------------------------------ estimated creatinine clearance is 65 mL/min (by C-G formula based on SCr of 0.76 mg/dL). ------------------------------------------------------------------------------------------------------------------ No results for input(s): TSH, T4TOTAL, T3FREE, THYROIDAB in the last 72 hours.  Invalid input(s): FREET3  Coagulation profile No results for input(s): INR, PROTIME in the last 168 hours. ------------------------------------------------------------------------------------------------------------------- No results for input(s): DDIMER in the last 72 hours. -------------------------------------------------------------------------------------------------------------------  Cardiac Enzymes No results for input(s): CKMB, TROPONINI, MYOGLOBIN in the last 168 hours.  Invalid input(s): CK ------------------------------------------------------------------------------------------------------------------    Component Value Date/Time   BNP 166.0 (H) 06/04/2020 1218     ---------------------------------------------------------------------------------------------------------------  Urinalysis    Component Value Date/Time   LEUKOCYTESUR small (1+) 11/28/2013 1020    ----------------------------------------------------------------------------------------------------------------   Imaging  Results:    DG Chest Port 1 View  Result Date: 06/04/2020 CLINICAL DATA:  Chest pain, wheezing, low oxygen saturation, cough; history hypertension, diabetes mellitus, COVID-19 test pending EXAM: PORTABLE CHEST 1 VIEW COMPARISON:  Portable exam 1227 hours without priors for comparison FINDINGS: Elevation of LEFT diaphragm. Upper normal heart size. Mediastinal contours and pulmonary vascularity normal. LEFT basilar atelectasis. Lungs otherwise clear. Minimal central peribronchial thickening. No infiltrate, pleural effusion or pneumothorax. Bones demineralized. IMPRESSION: Elevation of RIGHT diaphragm. Bronchitic changes with subsegmental atelectasis LEFT base. Electronically Signed   By: Lavonia Dana M.D.   On: 06/04/2020 12:48    My personal review of EKG: Rhythm NSR, Rate  95 /min, QTc 447 , no Acute ST changes   Assessment & Plan:    Active Problems:   Essential hypertension   Type 2 diabetes mellitus (HCC)   Morbid obesity (HCC)   Hypothyroidism   Reactive airway disease  Reactive airway disease/acute bronchitis -Is hypoxic 86% on room air, appears comfortable on 2 L Nasal cannula, this is most likely due to URI with reactive airway disease, COVID-19/flu is negative, will check respiratory panel for other pathogens, but given her hypoxia and significant wheezing she will be started on scheduled IV steroids, scheduled duo nebs, as needed albuterol, and IV azithromycin, she will be started on cough medications as well, incentive spirometry and Mucinex.  Diabetes mellitus -not on any medications, with most recent A1c is 6.6, she will be kept on insulin sliding scale still she is on IV steroids.  Hypertension -continue home medications  Chronic diastolic CHF -Given her poor oral intake will hold Demadex, can be resumed tomorrow  Hyperlipidemia -Continue with fish oil  DVT Prophylaxis   Lovenox  AM Labs Ordered, also please review Full Orders  Family Communication: Admission,  patients condition and plan of care including tests being ordered have been discussed with the patient and daughter  who indicate understanding and agree with the plan and Code Status.  Code Status Full  Likely DC to  home  Condition GUARDED    Consults called: none    Admission status: Observation    Time spent in minutes : 55 minutes   Phillips Climes M.D on 06/04/2020 at 4:50 PM   [facility] - Office  [phone]

## 2020-06-04 NOTE — Progress Notes (Signed)
Patient ID: Deanna Salinas, female    DOB: [birth year], 84 y.o.   MRN: [id number]   Chief Complaint  Patient presents with  . Fever    cough, congestion, ear pain- teeth even hurt for 2 days   Subjective:  CC: cough and difficulty breathing  Presents today for an outside visit.  During the interview, I could hear her wheezing, and her shortness of breath with conversation.  Her symptoms include cough, difficulty breathing, diarrhea, fatigue, chills, sore throat, ear pain, teeth pain.  Symptoms started Monday afternoon.  She does not have a history of asthma, and she is audibly wheezing.  I am very concerned for her health and safety at this point.    Medical History Glorian has a past medical history of Essential hypertension, Fatty liver, Hyperlipidemia, and Type 2 diabetes mellitus ([place]).   Outpatient Encounter Medications as of 06/04/2020  Medication Sig  . amLODipine (NORVASC) 2.5 MG tablet TAKE (1) TABLET BY MOUTH ONCE DAILY.  Marland Kitchen BAYER CONTOUR NEXT TEST test strip USE AS DIRECTED  . diphenhydrAMINE HCl (BENADRYL ALLERGY PO) Take 1 tablet by mouth at bedtime.  . Omega-3 Fatty Acids (FISH OIL PO) Take 1 capsule by mouth daily.   Marland Kitchen OVER THE COUNTER MEDICATION Take 1 tablet by mouth daily. Vit d 3  . OVER THE COUNTER MEDICATION CBD From [employer]. 1/2 off a dropper once daily sublingual.  . OVER THE COUNTER MEDICATION Melatonin at bedtime.  . Probiotic Product (PROBIOTIC DAILY PO) Take 1 tablet by mouth daily.  . quinapril (ACCUPRIL) 20 MG tablet TAKE (1) TABLET BY MOUTH ONCE DAILY.  Marland Kitchen temazepam (RESTORIL) 30 MG capsule TAKE 1 CAPSULE BY MOUTH AT BEDTIME AS NEEDED FOR SLEEP.  Marland Kitchen torsemide (DEMADEX) 20 MG tablet Take 1 tablet (20 mg total) by mouth daily.  . TURMERIC PO Take 1 capsule by mouth daily.    No facility-administered encounter medications on file as of 06/04/2020.     Review of Systems  Constitutional: Positive for chills and fever.  Respiratory: Positive for cough,  shortness of breath and wheezing.   Cardiovascular: Positive for leg swelling.     Vitals Temp 99.4 F (37.4 C)   SpO2 (!) 88%   Objective:   Physical Exam Vitals and nursing note reviewed.  Constitutional:      General: She is in acute distress.     Appearance: She is toxic-appearing.  Pulmonary:     Breath sounds: Wheezing present.      Assessment and Plan   1. Respiratory distress  2. Hypoxia   Khalea is in respiratory distress, her oxygen saturations are 88% on room air.  I did this brief assessment while she was still sitting in her car, as I did not feel it was safe for her to exert herself.  It was very difficult for her to carry on a conversation with me due to the coughing, shortness of breath, and wheezing.  It is felt that if she is not appropriate candidate for outpatient therapy at this time.  She is instructed to go immediately to [facility] emergency department.  I instructed her husband to immediately take her to the emergency department, and I offered to call EMS.  He is taking her to the emergency department now.  Phone call made to [facility] triage brief report given.  Agrees with plan of care discussed today. Understands warning signs to seek further care: immediately.  Understands to go immediately to the emergency department for a  higher level of care.  Pecolia Ades, FNP-C

## 2020-06-04 NOTE — ED Triage Notes (Signed)
Pt to er, pt states that she was sent from urgent care/[facility]d, pmd states that she was seeing pt and sent her to the er for wheezing and low O2 sat, pt has audible wheeze and cough.  States that she has had her covid vaccine.

## 2020-06-04 NOTE — ED Provider Notes (Signed)
[facility] EMERGENCY DEPARTMENT Provider Note   CSN: [id number] Arrival date & time: 06/04/20  1124     History Chief Complaint  Patient presents with  . Cough    Deanna Salinas is a 84 y.o. female.  HPI Patient is a 84 year old female with a history of DM 2, HLD, fatty liver, HTN, morbid obesity, degenerative disc disease requiring steroid injections, osteoarthritis, chronic bilateral lower extremity edema on Lasix.  She has not taken her Lasix for the past 3 days.   Patient presents today with complaint of cough.  She states that she has been coughing and wheezing worsening over the past 2 days.  She states that she has been vaccinated x3 for Covid.  She states she has had no known sick exposure that she knows of.  She states that her symptoms began with a cough Monday morning which seemed to worsen over the evening.  She denies any history of COPD or asthma and states that she does not use any inhalers at home she denies any chest pain lightheadedness or dizziness.  She states that she feels short of breath when she is coughing or when she exerts herself however she states that when she exerts herself the cough is worse.  She denies any hemoptysis.  She is not a cancer patient she denies any history of VTE, no recent surgeries or immobilization or long travel, she is on no hormone therapy.  She denies she has some history with lower extremity edema but states that this has not been a persistent issue recently.  She takes Lasix however is not taking in the past 3 days she denies any new leg swelling however.  No history of OSA has had negative sleep study in the past.  Does have history of insomnia however.  Echo done 03/31/2020 showed grade 1 diastolic dysfunction with impaired relaxation which was thought to be not significantly abnormal by cardiology.  EF was 60 to 65%.     Past Medical History:  Diagnosis Date  . Essential hypertension   . Fatty liver   . Hyperlipidemia   .  Type 2 diabetes mellitus [facility])     Patient Active Problem List   Diagnosis Date Noted  . Respiratory distress 06/04/2020  . Hypoxia 06/04/2020  . Primary osteoarthritis of both hands 01/23/2018  . Primary osteoarthritis of both knees 01/23/2018  . Primary osteoarthritis of both feet 01/23/2018  . Other idiopathic scoliosis, lumbar region 01/23/2018  . DDD (degenerative disc disease), lumbar 01/23/2018  . Hypothyroidism 08/20/2017  . Morbid obesity ([place]) 12/11/2016  . Leukocytosis 06/18/2015  . Type 2 diabetes mellitus ([place]) 06/16/2015  . Abdominal pain, epigastric 02/18/2014  . Elevated lipase 02/18/2014  . Pancreatitis, acute 12/11/2013  . POSTMENOPAUSAL OSTEOPOROSIS 02/17/2009  . Osteoarthritis 03/29/2008  . Chronic insomnia 09/14/2007  . Hyperlipidemia 08/26/2006  . DEPRESSION 08/26/2006  . MIGRAINE HEADACHE 08/26/2006  . CATARACT NOS 08/26/2006  . Essential hypertension 08/26/2006  . SCIATICA 08/26/2006    Past Surgical History:  Procedure Laterality Date  . APPENDECTOMY     At the time of hysterectomy  . CESAREAN SECTION     X5  . COLONOSCOPY    . COLONOSCOPY N/A 06/11/2013   Colonic diverticulosis, hyperplastic polyps. no further screening colonoscopies recommended  . ESOPHAGOGASTRODUODENOSCOPY N/A 02/20/2014   Procedure: ESOPHAGOGASTRODUODENOSCOPY (EGD);  Surgeon: Daneil Dolin, MD;  Location: [facility] ENDO SUITE;  Service: Endoscopy;  Laterality: N/A;  11:00     OB History   No obstetric  history on file.     Family History  Problem Relation Age of Onset  . Heart Problems Father   . Hypertension Other   . Hypertension Other   . Cancer Mother   . Cancer Daughter        breast   . Colon cancer Neg Hx     Social History   Tobacco Use  . Smoking status: Never Smoker  . Smokeless tobacco: Never Used  Vaping Use  . Vaping Use: Never used  Substance Use Topics  . Alcohol use: Yes    Alcohol/week: 3.0 standard drinks    Types: 3 Glasses of wine per week     Comment: occasional  . Drug use: No    Home Medications Prior to Admission medications   Medication Sig Start Date End Date Taking? Authorizing Provider  amLODipine (NORVASC) 2.5 MG tablet TAKE (1) TABLET BY MOUTH ONCE DAILY. Patient taking differently: Take 2.5 mg by mouth daily.  10/31/19  Yes Nilda Simmer, NP  Omega-3 Fatty Acids (FISH OIL PO) Take 1 capsule by mouth daily.    Yes [provider]  quinapril (ACCUPRIL) 20 MG tablet TAKE (1) TABLET BY MOUTH ONCE DAILY. Patient taking differently: Take 20 mg by mouth daily.  05/27/20  Yes Luking, Scott A, MD  temazepam (RESTORIL) 30 MG capsule TAKE 1 CAPSULE BY MOUTH AT BEDTIME AS NEEDED FOR SLEEP. Patient taking differently: Take 30 mg by mouth at bedtime as needed for sleep.  05/06/20  Yes Kathyrn Drown, MD  torsemide (DEMADEX) 20 MG tablet Take 1 tablet (20 mg total) by mouth daily. 03/28/20 06/26/20 Yes Strader, Fransisco Hertz, PA-C  TURMERIC PO Take 1 capsule by mouth daily.    Yes [provider]  BAYER CONTOUR NEXT TEST test strip USE AS DIRECTED 07/28/16   Kathyrn Drown, MD    Allergies    Ambien [zolpidem tartrate], Lipitor [atorvastatin], Metformin and related, Pravastatin, and Trazodone and nefazodone  Review of Systems   Review of Systems  Constitutional: Positive for fatigue. Negative for fever.  HENT: Positive for congestion and rhinorrhea.   Respiratory: Positive for cough and shortness of breath.   Cardiovascular: Positive for leg swelling (chronic, unchanged). Negative for chest pain and palpitations.  Gastrointestinal: Negative for abdominal distention, abdominal pain, diarrhea, nausea and vomiting.  Genitourinary: Negative for dysuria.  Neurological: Negative for dizziness and headaches.    Physical Exam Updated Vital Signs BP 132/62   Pulse 88   Temp 98.8 F (37.1 C) (Oral)   Resp 15   Ht 5\' 5"  (1.651 m)   Wt 104.3 kg   SpO2 90%   BMI 38.27 kg/m   Physical Exam Vitals and  nursing note reviewed.  Constitutional:      General: She is not in acute distress.    Comments: Pleasant hard of hearing 84 year old female able answer questions appropriately follow commands.  HENT:     Head: Normocephalic and atraumatic.     Nose: Nose normal.     Mouth/Throat:     Mouth: Mucous membranes are moist.  Eyes:     General: No scleral icterus. Neck:     Comments: No JVD Cardiovascular:     Rate and Rhythm: Normal rate and regular rhythm.     Pulses: Normal pulses.     Heart sounds: Normal heart sounds.  Pulmonary:     Effort: Pulmonary effort is normal. No respiratory distress.     Breath sounds: Wheezing present.  Comments: Loud expiratory wheezing diffusely.  Also coarse breath sounds in all fields.  Mild tachypnea with rate of 22-24.  However, no increased work of breathing and speaking in full sentences. Abdominal:     Palpations: Abdomen is soft.     Tenderness: There is no abdominal tenderness. There is no guarding or rebound.  Musculoskeletal:     Cervical back: Normal range of motion.     Right lower leg: No edema.     Left lower leg: No edema.     Comments: Bilateral lower extremities without any significant edema.  Skin:    General: Skin is warm and dry.     Capillary Refill: Capillary refill takes less than 2 seconds.  Neurological:     Mental Status: She is alert. Mental status is at baseline.  Psychiatric:        Mood and Affect: Mood normal.        Behavior: Behavior normal.     ED Results / Procedures / Treatments   Labs (all labs ordered are listed, but only abnormal results are displayed) Labs Reviewed  BASIC METABOLIC PANEL - Abnormal; Notable for the following components:      Result Value   Glucose, Bld 124 (*)    All other components within normal limits  CBC WITH DIFFERENTIAL/PLATELET - Abnormal; Notable for the following components:   MCV 102.7 (*)    Monocytes Absolute 1.2 (*)    All other components within normal limits    BRAIN NATRIURETIC PEPTIDE - Abnormal; Notable for the following components:   B Natriuretic Peptide 166.0 (*)    All other components within normal limits  RESPIRATORY PANEL BY RT PCR (FLU A&B, COVID)  RESPIRATORY PANEL BY PCR    EKG EKG Interpretation  Date/Time:  Wednesday June 04 2020 12:40:43 EDT Ventricular Rate:  95 PR Interval:    QRS Duration: 100 QT Interval:  355 QTC Calculation: 447 R Axis:   -8 Text Interpretation: Sinus rhythm Low voltage, precordial leads Confirmed by Fredia Sorrow [phone]) on 06/04/2020 3:44:58 PM   Radiology DG Chest Port 1 View  Result Date: 06/04/2020 CLINICAL DATA:  Chest pain, wheezing, low oxygen saturation, cough; history hypertension, diabetes mellitus, COVID-19 test pending EXAM: PORTABLE CHEST 1 VIEW COMPARISON:  Portable exam 1227 hours without priors for comparison FINDINGS: Elevation of LEFT diaphragm. Upper normal heart size. Mediastinal contours and pulmonary vascularity normal. LEFT basilar atelectasis. Lungs otherwise clear. Minimal central peribronchial thickening. No infiltrate, pleural effusion or pneumothorax. Bones demineralized. IMPRESSION: Elevation of RIGHT diaphragm. Bronchitic changes with subsegmental atelectasis LEFT base. Electronically Signed   By: Lavonia Dana M.D.   On: 06/04/2020 12:48    Procedures Procedures (including critical care time)  Medications Ordered in ED Medications  Ipratropium-Albuterol (COMBIVENT) respimat 2 puff (2 puffs Inhalation Given 06/04/20 1428)  albuterol (VENTOLIN HFA) 108 (90 Base) MCG/ACT inhaler 4 puff (4 puffs Inhalation Not Given 06/04/20 1439)  magnesium sulfate IVPB 2 g 50 mL (0 g Intravenous Stopped 06/04/20 1348)  albuterol (VENTOLIN HFA) 108 (90 Base) MCG/ACT inhaler 6 puff (6 puffs Inhalation Given 06/04/20 1233)  methylPREDNISolone sodium succinate (SOLU-MEDROL) 125 mg/2 mL injection 125 mg (125 mg Intravenous Given 06/04/20 1233)  benzonatate (TESSALON) capsule 200 mg  (200 mg Oral Given 06/04/20 1300)    ED Course  I have reviewed the triage vital signs and the nursing notes.  Pertinent labs & imaging results that were available during my care of the patient were reviewed by me and considered  in my medical decision making (see chart for details).  Patient is an 84 year old female past medical history detailed above presented today for cough wheezing or shortness of breath.  She is hypoxic on room air with O2 sats 86%.  She received medications listed below.  On my examination she is not tachycardic she is somewhat tachypneic.  Her blood pressure is mildly elevated however all of these vital signs improved with treatment other than the hypoxia.  Because of the significant wheezing that she is having I have high suspicion for reactive airway likely secondary to URI she is not a smoker.  She has no history of COPD or asthma.  She does not use any inhalers at home.  Low suspicion for CHF as patient has preserved ejection fraction and BNP only 166.  She does not appear clinically fluid overloaded.  Clinical Course as of Jun 04 1556  Wed Jun 04, 2020  1314 BMP with out any electrolyte abnormalities.  Basic metabolic panel(!) [WF]  7654 No leukocytosis or anemia.  CBC with Differential/Platelet(!) [WF]  1315 Negative for COVID/Flu  Respiratory Panel by RT PCR (Flu A&B, Covid) - Nasopharyngeal Swab [WF]  1546 Patient has received 2 rounds of albuterol MDI, Combivent, magnesium, steroids, benzonatate and continues to have mild desaturation at rest to approximately 86/87%.  She continues to deny any chest pain.  I have very low suspicion for pulmonary embolism however patient will require admission for hypoxia.   [WF]    Clinical Course User Index [WF] Tedd Sias, [place]   MDM Rules/Calculators/A&P                          12:40 PM reassessed patient after methylprednisolone, albuterol, back sulfate.  This was given approximately 10 minutes ago.  Her  wheezing is significantly improved.  Oxygen now off patient is satting 98% on room air.  She is still breathing with mild tachypnea rate of 26.  No increased work of breathing however.  Speaking in full sentences.  No tachycardia.  Lung sounds are coarse diffusely.   I discussed this case with my attending physician who cosigned this note including patient's presenting symptoms, physical exam, and planned diagnostics and interventions. Attending physician stated agreement with plan or made changes to plan which were implemented.   Attending physician assessed patient at bedside.  Admitted patient to hospitalist service. 3:59 PM   Final Clinical Impression(s) / ED Diagnoses Final diagnoses:  Cough  Wheezing  Hypoxia    Rx / DC Orders ED Discharge Orders    None       Tedd Sias, [place] 06/04/20 1559    Fredia Sorrow, MD 06/12/20 [phone]

## 2020-06-05 ENCOUNTER — Ambulatory Visit: Payer: Medicare Other | Admitting: Student

## 2020-06-05 DIAGNOSIS — E039 Hypothyroidism, unspecified: Secondary | ICD-10-CM

## 2020-06-05 DIAGNOSIS — J40 Bronchitis, not specified as acute or chronic: Secondary | ICD-10-CM | POA: Diagnosis not present

## 2020-06-05 DIAGNOSIS — R0902 Hypoxemia: Secondary | ICD-10-CM | POA: Diagnosis not present

## 2020-06-05 DIAGNOSIS — E119 Type 2 diabetes mellitus without complications: Secondary | ICD-10-CM | POA: Diagnosis not present

## 2020-06-05 DIAGNOSIS — J45909 Unspecified asthma, uncomplicated: Secondary | ICD-10-CM | POA: Diagnosis not present

## 2020-06-05 DIAGNOSIS — R059 Cough, unspecified: Secondary | ICD-10-CM | POA: Diagnosis not present

## 2020-06-05 DIAGNOSIS — I10 Essential (primary) hypertension: Secondary | ICD-10-CM | POA: Diagnosis not present

## 2020-06-05 LAB — BASIC METABOLIC PANEL WITH GFR
Anion gap: 10 (ref 5–15)
BUN: 23 mg/dL (ref 8–23)
CO2: 28 mmol/L (ref 22–32)
Calcium: 8.8 mg/dL — ABNORMAL LOW (ref 8.9–10.3)
Chloride: 98 mmol/L (ref 98–111)
Creatinine, Ser: 0.88 mg/dL (ref 0.44–1.00)
GFR, Estimated: >60 mL/min
Glucose, Bld: 191 mg/dL — ABNORMAL HIGH (ref 70–99)
Potassium: 4.2 mmol/L (ref 3.5–5.1)
Sodium: 136 mmol/L (ref 135–145)

## 2020-06-05 LAB — CBC
HCT: 38 % (ref 36.0–46.0)
Hemoglobin: 12.2 g/dL (ref 12.0–15.0)
MCH: 32.2 pg (ref 26.0–34.0)
MCHC: 32.1 g/dL (ref 30.0–36.0)
MCV: 100.3 fL — ABNORMAL HIGH (ref 80.0–100.0)
Platelets: 187 K/uL (ref 150–400)
RBC: 3.79 MIL/uL — ABNORMAL LOW (ref 3.87–5.11)
RDW: 12.6 % (ref 11.5–15.5)
WBC: 6.4 K/uL (ref 4.0–10.5)
nRBC: 0 % (ref 0.0–0.2)

## 2020-06-05 LAB — GLUCOSE, CAPILLARY
Glucose-Capillary: 173 mg/dL — ABNORMAL HIGH (ref 70–99)
Glucose-Capillary: 193 mg/dL — ABNORMAL HIGH (ref 70–99)

## 2020-06-05 MED ORDER — AZITHROMYCIN 250 MG PO TABS: ORAL_TABLET | ORAL | 0 refills | Status: AC

## 2020-06-05 MED ORDER — LISINOPRIL 10 MG PO TABS
20.0000 mg | ORAL_TABLET | Freq: Every day | ORAL | Status: DC
  Administered 2020-06-05: 20 mg via ORAL
  Filled 2020-06-05: qty 2

## 2020-06-05 MED ORDER — PREDNISONE 20 MG PO TABS: 40.0000 mg | ORAL_TABLET | Freq: Every day | ORAL | 0 refills | Status: AC

## 2020-06-05 MED ORDER — ALBUTEROL SULFATE HFA 108 (90 BASE) MCG/ACT IN AERS: 2.0000 | INHALATION_SPRAY | Freq: Four times a day (QID) | RESPIRATORY_TRACT | 2 refills | Status: DC | PRN

## 2020-06-05 MED ORDER — GUAIFENESIN-DM 100-10 MG/5ML PO SYRP: 5.0000 mL | ORAL_SOLUTION | ORAL | 0 refills | Status: DC | PRN

## 2020-06-05 NOTE — Evaluation (Signed)
Physical Therapy Evaluation Patient Details Name: Deanna Salinas MRN: [id number] DOB: 08-Dec-1935 Today's Date: 06/05/2020   History of Present Illness  Deanna Salinas  is a 84 y.o. female, with past medical history of diabetes mellitus type 2 (not on any medication reports her most recent A1c was 6.5), hyperlipidemia, fatty liver, hypertension, HF, morbid obesity, degenerative disc disease, osteoarthritis, chronic bilateral lower extremity edema, she has not taken her Lasix for last 3 days, patient presents to ED secondary to complaints of cough started last Monday, and some wheezing, increased work of breathing as well, cough with a productive of phlegm, she denies any sick exposure, she denies any history of COPD, asthma or smoking in the past, no fever, no chills, no hemoptysis.- in ED patient was noted to be hypoxic 86% on room air, where she has been requiring 2 L oxygen via nasal cannula, no acute infectious process, she is COVID-19 negative, she had diffuse wheezing, she was given Solu-Medrol by EMS, and received nebulizer x3 in ED, given her wheezing and oxygen requirement triage hospitalist consulted to admit    Clinical Impression  Patient functioning at baseline for functional mobility and gait.  Patient instructed in and given written instructions for HEP with understanding acknowledged.  Plan:  Patient discharged from physical therapy to care of nursing for ambulation daily as tolerated for length of stay.     Follow Up Recommendations No PT follow up;Supervision - Intermittent    Equipment Recommendations  None recommended by PT    Recommendations for Other Services       Precautions / Restrictions Precautions Precautions: None Restrictions Weight Bearing Restrictions: No      Mobility  Bed Mobility Overal bed mobility: Modified Independent                  Transfers Overall transfer level: Modified independent Equipment used: 4-wheeled walker                 Ambulation/Gait Ambulation/Gait assistance: Modified independent (Device/Increase time) Gait Distance (Feet): 100 Feet Assistive device: Rolling walker (2 wheeled) Gait Pattern/deviations: Decreased step length - right;Decreased step length - left;Decreased stride length Gait velocity: decreased   General Gait Details: demonstrates good return for ambulation in room and hallway without loss of balance, on room air with SpO2 at 93%  Stairs            Wheelchair Mobility    Modified Rankin (Stroke Patients Only)       Balance Overall balance assessment: Needs assistance Sitting-balance support: Feet supported;No upper extremity supported Sitting balance-Leahy Scale: Good Sitting balance - Comments: seated at EOB   Standing balance support: During functional activity;Bilateral upper extremity supported Standing balance-Leahy Scale: Fair Standing balance comment: fair/good using RW                             Pertinent Vitals/Pain Pain Assessment: No/denies pain    Home Living Family/patient expects to be discharged to:: Private residence Living Arrangements: Spouse/significant other Available Help at Discharge: Family;Available 24 hours/day Type of Home: House Home Access: Elevator     Home Layout: One level Home Equipment: Grab bars - tub/shower;Walker - 4 wheels      Prior Function Level of Independence: Independent with assistive device(s)         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, [employer]        Extremity/Trunk Assessment   Upper Extremity Assessment  Upper Extremity Assessment: Overall WFL for tasks assessed    Lower Extremity Assessment Lower Extremity Assessment: Overall WFL for tasks assessed    Cervical / Trunk Assessment Cervical / Trunk Assessment: Normal  Communication   Communication: HOH  Cognition Arousal/Alertness: Awake/alert Behavior During Therapy: WFL for tasks  assessed/performed Overall Cognitive Status: Within Functional Limits for tasks assessed                                        General Comments      Exercises     Assessment/Plan    PT Assessment Patent does not need any further PT services  PT Problem List         PT Treatment Interventions      PT Goals (Current goals can be found in the Care Plan section)  Acute Rehab PT Goals Patient Stated Goal: return home with family to assist PT Goal Formulation: With patient Time For Goal Achievement: 06/05/20 Potential to Achieve Goals: Good    Frequency     Barriers to discharge        Co-evaluation               AM-PAC PT "6 Clicks" Mobility  Outcome Measure Help needed turning from your back to your side while in a flat bed without using bedrails?: None Help needed moving from lying on your back to sitting on the side of a flat bed without using bedrails?: None Help needed moving to and from a bed to a chair (including a wheelchair)?: None Help needed standing up from a chair using your arms (e.g., wheelchair or bedside chair)?: None Help needed to walk in hospital room?: None Help needed climbing 3-5 steps with a railing? : A Little 6 Click Score: 23    End of Session   Activity Tolerance: Patient tolerated treatment well;Patient limited by fatigue Patient left: in bed;with call bell/phone within reach Nurse Communication: Mobility status PT Visit Diagnosis: Unsteadiness on feet (R26.81);Other abnormalities of gait and mobility (R26.89);Muscle weakness (generalized) (M62.81)    Time: 0950-1004 PT Time Calculation (min) (ACUTE ONLY): 14 min   Charges:   PT Evaluation $PT Eval Low Complexity: 1 Low PT Treatments $Therapeutic Activity: 8-22 mins        2:02 PM, 06/05/20 Lonell Grandchild, MPT Physical Therapist with [facility] [phone] office [phone] mobile phone

## 2020-06-05 NOTE — Discharge Summary (Signed)
Physician Discharge Summary  Deanna Salinas WGN:[id number] DOB: Dec 17, 1935 DOA: 06/04/2020  PCP: Kathyrn Drown, MD  Admit date: 06/04/2020 Discharge date: 06/05/2020  Time spent: 35 minutes  Recommendations for Outpatient Follow-up:  1. Repeat BMET to follow electrolytes and renal function 2. Follow-up patient's symptoms resolution  3. Follow vital signs and adjust antihypertensive regimen as needed.   Discharge Diagnoses:  Active Problems:   Essential hypertension   Type 2 diabetes mellitus ([place])   Morbid obesity ([place])   Hypothyroidism   Reactive airway disease   Bronchitis   Discharge Condition: Stable and improved.  Patient discharged home with instruction to follow-up with PCP in 10 days.  CODE STATUS: Full code.  Diet recommendation: Heart healthy and modify carbohydrates diet.  Filed Weights   06/04/20 1159  Weight: 104.3 kg    History of present illness:  Deanna Salinas  is a 84 y.o. female, with past medical history of diabetes mellitus type 2 (not on any medication reports her most recent A1c was 6.5), hyperlipidemia, fatty liver, hypertension, HF, morbid obesity, degenerative disc disease, osteoarthritis, chronic bilateral lower extremity edema, she has not taken her Lasix for last 3 days, patient presents to ED secondary to complaints of cough started last Monday, and some wheezing, increased work of breathing as well, cough with a productive of phlegm, she denies any sick exposure, she denies any history of COPD, asthma or smoking in the past, no fever, no chills, no hemoptysis. - in ED patient was noted to be hypoxic 86% on room air, where she has been requiring 2 L oxygen via nasal cannula, no acute infectious process, she is COVID-19 negative, she had diffuse wheezing, she was given Solu-Medrol by EMS, and received nebulizer x3 in ED, given her wheezing and oxygen requirement [facility] consulted to place in the hospital for further evaluation and  management.    Hospital Course:  1-acute bronchitis with reactive airway disease -Patient hypoxic on room air on presentation (86%) -Chest x-ray with bronchitic changes no acute infiltrate -Great improvement after initiation of antibiotics and steroids along with nebulizer management -Patient during my examination no longer hypoxic while still having some intermittent coughing spells. -Will discharge on 5 days of prednisone 40 mg daily; continue Z-Pak for atypical coverage and as needed albuterol inhaler. -Patient will follow up with PCP in 10 days.  2-type 2 diabetes mellitus -Most recent A1c 6.6 -Continue current hypoglycemic regimen and modify carbohydrate diet.  3-class II obesity -Low calorie diet, portion control increase physical activity discussed with patient -Body mass index is 38.27 kg/m.  4-hyperlipidemia -Continue fish oil and heart healthy diet.  5-hypertension -Resume home antihypertensive agents -Heart healthy diet has been instructed.  6-chronic diastolic heart failure -appears compensated -will resume demadex and patient instructed to check weight on daily basis and follow heart healthy diet.  Procedures: See below for x-ray reports.   Consultations:  None.  Discharge Exam: Vitals:   06/05/20 1151 06/05/20 1344  BP:  126/69  Pulse:  96  Resp:  18  Temp:  97.8 F (36.6 C)  SpO2: 96% 93%    General: Afebrile, no chest pain, no nausea, no vomiting.  Still with some dry intermittent coughing spells; but no requiring oxygen supplementation and expressing improvement in her breathing. Cardiovascular: S1 and S2, no rubs, no gallops, unable to properly assess JVD with body habitus. Respiratory: Improved air movement bilaterally; positive scattered rhonchi.  No using accessory muscle.  Good oxygen saturation on room air Abdomen: Soft,  obese, nontender, distended, positive bowel sounds. Extremities: No cyanosis or clubbing.  Trace edema appreciated  bilaterally (patient expressed unchanged from her baseline).  Discharge Instructions   Discharge Instructions    Diet - low sodium heart healthy   Complete by: As directed    Discharge instructions   Complete by: As directed    Take Medications are prescribed  Maintain adequate hydration Arrange follow-up with PCP in 10 days Check your weight on daily basis.   Increase activity slowly   Complete by: As directed      Allergies as of 06/05/2020      Reactions   Ambien [zolpidem Tartrate]    Side effects   Lipitor [atorvastatin]    Muscle and joint pain   Metformin And Related    Reported side effects headaches insomnia nausea   Pravastatin    Muscle and joint pain   Trazodone And Nefazodone    Side effects      Medication List    TAKE these medications   albuterol 108 (90 Base) MCG/ACT inhaler Commonly known as: VENTOLIN HFA Inhale 2 puffs into the lungs every 6 (six) hours as needed for wheezing or shortness of breath.   amLODipine 2.5 MG tablet Commonly known as: NORVASC TAKE (1) TABLET BY MOUTH ONCE DAILY. What changed:   how much to take  how to take this  when to take this  additional instructions   azithromycin 250 MG tablet Commonly known as: Zithromax Z-Pak Take 2 tablets (500 mg) on  Day 1,  followed by 1 tablet (250 mg) once daily on Days 2 through 5.   Bayer Contour Next Test test strip Generic drug: glucose blood USE AS DIRECTED   FISH OIL PO Take 1 capsule by mouth daily.   guaiFENesin-dextromethorphan 100-10 MG/5ML syrup Commonly known as: ROBITUSSIN DM Take 5 mLs by mouth every 4 (four) hours as needed for cough.   predniSONE 20 MG tablet Commonly known as: Deltasone Take 2 tablets (40 mg total) by mouth daily for 5 days.   quinapril 20 MG tablet Commonly known as: ACCUPRIL TAKE (1) TABLET BY MOUTH ONCE DAILY. What changed: See the new instructions.   temazepam 30 MG capsule Commonly known as: RESTORIL TAKE 1 CAPSULE BY MOUTH  AT BEDTIME AS NEEDED FOR SLEEP. What changed: See the new instructions.   torsemide 20 MG tablet Commonly known as: DEMADEX Take 1 tablet (20 mg total) by mouth daily.   TURMERIC PO Take 1 capsule by mouth daily.      Allergies  Allergen Reactions  . Ambien [Zolpidem Tartrate]     Side effects   . Lipitor [Atorvastatin]     Muscle and joint pain  . Metformin And Related     Reported side effects headaches insomnia nausea  . Pravastatin     Muscle and joint pain  . Trazodone And Nefazodone     Side effects    Follow-up Information    Luking, Elayne Snare, MD. Schedule an appointment as soon as possible for a visit in 10 day(s).   Specialty: Family Medicine Contact information: [place] [address] [phone]        Satira Sark, MD .   Specialty: Cardiology Contact information: [place] [address] [phone]               The results of significant diagnostics from this hospitalization (including imaging, microbiology, ancillary and laboratory) are listed below for reference.  Significant Diagnostic Studies: DG Chest Port 1 View  Result Date: 06/04/2020 CLINICAL DATA:  Chest pain, wheezing, low oxygen saturation, cough; history hypertension, diabetes mellitus, COVID-19 test pending EXAM: PORTABLE CHEST 1 VIEW COMPARISON:  Portable exam 1227 hours without priors for comparison FINDINGS: Elevation of LEFT diaphragm. Upper normal heart size. Mediastinal contours and pulmonary vascularity normal. LEFT basilar atelectasis. Lungs otherwise clear. Minimal central peribronchial thickening. No infiltrate, pleural effusion or pneumothorax. Bones demineralized. IMPRESSION: Elevation of RIGHT diaphragm. Bronchitic changes with subsegmental atelectasis LEFT base. Electronically Signed   By: Lavonia Dana M.D.   On: 06/04/2020 12:48    Microbiology: Recent Results (from the past 240 hour(s))  Respiratory Panel by RT PCR  (Flu A&B, Covid) - Nasopharyngeal Swab     Status: None   Collection Time: 06/04/20 12:25 PM   Specimen: Nasopharyngeal Swab  Result Value Ref Range Status   SARS Coronavirus 2 by RT PCR NEGATIVE NEGATIVE Final   Influenza A by PCR NEGATIVE NEGATIVE Final   Influenza B by PCR NEGATIVE NEGATIVE Final    Comment: Performed at [facility] [address] [place], [place] [address]     Labs: Basic Metabolic Panel: Recent Labs  Lab 06/04/20 1225 06/05/20 0535  NA 135 136  K 4.1 4.2  CL 98 98  CO2 29 28  GLUCOSE 124* 191*  BUN 16 23  CREATININE 0.76 0.88  CALCIUM 9.2 8.8*   CBC: Recent Labs  Lab 06/04/20 1225 06/05/20 0535  WBC 10.2 6.4  NEUTROABS 6.9  --   HGB 13.1 12.2  HCT 41.4 38.0  MCV 102.7* 100.3*  PLT 207 187   BNP (last 3 results) Recent Labs    06/04/20 1218  BNP 166.0*   CBG: Recent Labs  Lab 06/04/20 2225 06/05/20 0742 06/05/20 1135  GLUCAP 238* 173* 193*    Signed:  Barton Dubois MD.  [facility] 06/05/2020, 3:54 PM

## 2020-06-05 NOTE — Progress Notes (Signed)
O2 sats 90-93% while ambulating on room air.

## 2020-06-06 LAB — HEMOGLOBIN A1C
Hgb A1c MFr Bld: 6.8 % — ABNORMAL HIGH (ref 4.8–5.6)
Mean Plasma Glucose: 148 mg/dL

## 2020-06-09 ENCOUNTER — Ambulatory Visit (INDEPENDENT_AMBULATORY_CARE_PROVIDER_SITE_OTHER): Payer: Medicare Other | Admitting: Family Medicine

## 2020-06-09 ENCOUNTER — Other Ambulatory Visit: Payer: Self-pay

## 2020-06-09 DIAGNOSIS — D7589 Other specified diseases of blood and blood-forming organs: Secondary | ICD-10-CM

## 2020-06-09 DIAGNOSIS — M48062 Spinal stenosis, lumbar region with neurogenic claudication: Secondary | ICD-10-CM | POA: Diagnosis not present

## 2020-06-09 MED ORDER — NORTRIPTYLINE HCL 10 MG PO CAPS: ORAL_CAPSULE | ORAL | 3 refills | Status: DC

## 2020-06-09 NOTE — Progress Notes (Signed)
   Subjective:    Patient ID: Deanna Salinas, female    DOB: November 29, 1935, 84 y.o.   MRN: [id number]  HPI  Patient arrives to discuss recent neurosurgeon appt. Patient states their records show they gave her medications and she wants Dr know she did not fill those medicines and doesn't want doctor to think she is getting medicine for multiple providers.  Patient would like some guidance on what to do to get some relief from her pain.  Patient with ongoing low back pain discomfort radiates down the legs.  Previous doctor prescribed nortriptyline but patient never took this She is frustrated about her condition She states that the previous group did not really figure out how to best approach it and she kept juggling between multiple doctors I have talked with her about how we could get her in with the orthopedic doctor who works with the backs  she would like to go see her orthopedist for further evaluation I believe this would be a good plan Review of Systems  Constitutional: Negative for activity change, appetite change and fatigue.  HENT: Negative for congestion and rhinorrhea.   Respiratory: Negative for cough and shortness of breath.   Cardiovascular: Negative for chest pain and leg swelling.  Gastrointestinal: Negative for abdominal pain and diarrhea.  Musculoskeletal: Positive for arthralgias and back pain.  Skin: Negative for color change.  Neurological: Negative for weakness.  Psychiatric/Behavioral: Negative for confusion.       Objective:   Physical Exam Vitals reviewed.  Constitutional:      General: She is not in acute distress. HENT:     Head: Normocephalic.  Cardiovascular:     Rate and Rhythm: Normal rate and regular rhythm.     Heart sounds: Normal heart sounds. No murmur heard.   Pulmonary:     Effort: Pulmonary effort is normal.     Breath sounds: Normal breath sounds.  Lymphadenopathy:     Cervical: No cervical adenopathy.  Neurological:     Mental Status:  She is alert.  Psychiatric:        Behavior: Behavior normal.           Assessment & Plan:  1. Hypocalcemia Recent hospitalization low calcium she was in the hospital for respiratory issues we will recheck metabolic 7 - Basic metabolic panel  2. Spinal stenosis of lumbar region with neurogenic claudication Referral to the orthopedic specialist at [facility] they have a doctor within the group that does injections hopefully this would help her I do not feel she would be a good candidate for surgery because of multiple levels of degenerative changes and her age and comorbidities We will try nortriptyline start off with 10 mg each evening after 2 weeks we will bump this up to 20 mg if she does not tolerate this then we will stop the medicine - Ambulatory referral to Orthopedic Surgery  3. Macrocytosis without anemia Check B12 level because her MCV level keeps going up now well above 100 thank you - Vitamin B12 Patient has standard follow-up visit in approximately 2 weeks

## 2020-06-11 DIAGNOSIS — Z6837 Body mass index (BMI) 37.0-37.9, adult: Secondary | ICD-10-CM | POA: Diagnosis not present

## 2020-06-11 DIAGNOSIS — M170 Bilateral primary osteoarthritis of knee: Secondary | ICD-10-CM | POA: Diagnosis not present

## 2020-06-13 DIAGNOSIS — D7589 Other specified diseases of blood and blood-forming organs: Secondary | ICD-10-CM | POA: Diagnosis not present

## 2020-06-14 LAB — BASIC METABOLIC PANEL
BUN/Creatinine Ratio: 20 (ref 12–28)
BUN: 19 mg/dL (ref 8–27)
CO2: 27 mmol/L (ref 20–29)
Calcium: 9.5 mg/dL (ref 8.7–10.3)
Chloride: 98 mmol/L (ref 96–106)
Creatinine, Ser: 0.97 mg/dL (ref 0.57–1.00)
GFR calc Af Amer: 63 mL/min/{1.73_m2} (ref >59)
GFR calc non Af Amer: 55 mL/min/{1.73_m2} — ABNORMAL LOW (ref >59)
Glucose: 121 mg/dL — ABNORMAL HIGH (ref 65–99)
Potassium: 5.2 mmol/L (ref 3.5–5.2)
Sodium: 141 mmol/L (ref 134–144)

## 2020-06-14 LAB — VITAMIN B12: Vitamin B-12: 787 pg/mL (ref 232–1245)

## 2020-06-23 ENCOUNTER — Ambulatory Visit (INDEPENDENT_AMBULATORY_CARE_PROVIDER_SITE_OTHER): Payer: Medicare Other | Admitting: Family Medicine

## 2020-06-23 ENCOUNTER — Other Ambulatory Visit: Payer: Self-pay

## 2020-06-23 ENCOUNTER — Encounter: Payer: Self-pay | Admitting: Family Medicine

## 2020-06-23 VITALS — BP 124/80 | HR 56 | Temp 97.9°F | Ht 65.0 in | Wt 239.0 lb

## 2020-06-23 DIAGNOSIS — M48062 Spinal stenosis, lumbar region with neurogenic claudication: Secondary | ICD-10-CM | POA: Diagnosis not present

## 2020-06-23 DIAGNOSIS — J069 Acute upper respiratory infection, unspecified: Secondary | ICD-10-CM

## 2020-06-23 MED ORDER — TEMAZEPAM 30 MG PO CAPS
ORAL_CAPSULE | ORAL | 4 refills | Status: DC
Start: 2020-06-23 — End: 2020-12-11

## 2020-06-23 NOTE — Progress Notes (Signed)
   Subjective:    Patient ID: Deanna Salinas, female    DOB: 02/24/1936, 84 y.o.   MRN: [id number]  HPIpt arrives for a med check up. States she is getting better from resp infection but it is still hanging on. Taking mucinex, tussin dm, and albuterol.  From a respiratory standpoint she is starting to do better coughing up less phlegm denies any wheezing or shortness of breath  As for her spinal stenosis symptoms nortriptyline is helping her and helping her with her rest  Review of Systems See above    Objective:   Physical Exam Lungs clear respiratory rate normal heart regular pulse normal extremities no edema       Assessment & Plan:  Spinal stenosis utilize nortriptyline at nighttime to see spinal specialist with [facility] hopefully within the next month  Respiratory illness gradually getting better no need for further antibiotics  Refill on her medication was given  Follow-up in 3 to 4 months sooner problems

## 2020-06-25 DIAGNOSIS — E119 Type 2 diabetes mellitus without complications: Secondary | ICD-10-CM | POA: Diagnosis not present

## 2020-08-25 ENCOUNTER — Other Ambulatory Visit: Payer: Self-pay | Admitting: Family Medicine

## 2020-08-25 ENCOUNTER — Other Ambulatory Visit: Payer: Self-pay | Admitting: Nurse Practitioner

## 2020-08-26 NOTE — Telephone Encounter (Signed)
6 months refill

## 2020-09-08 ENCOUNTER — Other Ambulatory Visit: Payer: Self-pay | Admitting: Family Medicine

## 2020-10-21 ENCOUNTER — Ambulatory Visit: Payer: Medicare Other | Admitting: Family Medicine

## 2020-11-13 DIAGNOSIS — G894 Chronic pain syndrome: Secondary | ICD-10-CM | POA: Diagnosis not present

## 2020-11-13 DIAGNOSIS — M48061 Spinal stenosis, lumbar region without neurogenic claudication: Secondary | ICD-10-CM | POA: Diagnosis not present

## 2020-12-08 ENCOUNTER — Telehealth: Payer: Self-pay | Admitting: Family Medicine

## 2020-12-08 DIAGNOSIS — I10 Essential (primary) hypertension: Secondary | ICD-10-CM

## 2020-12-08 DIAGNOSIS — E119 Type 2 diabetes mellitus without complications: Secondary | ICD-10-CM

## 2020-12-09 NOTE — Telephone Encounter (Signed)
May have 90-day Needs follow-up office visit Check labs including CMP, lipid, A1c

## 2020-12-10 ENCOUNTER — Telehealth: Payer: Self-pay | Admitting: Family Medicine

## 2020-12-10 NOTE — Telephone Encounter (Signed)
[facility] requesting refill on Temazepam 30 mg capsules. Take one capsule po at bedtimes prn sleep. Pt has upcoming appt on 12/28/20. Please advise. Thank you

## 2020-12-10 NOTE — Telephone Encounter (Signed)
Sent my chart message too schedule appointment

## 2020-12-11 ENCOUNTER — Other Ambulatory Visit: Payer: Self-pay | Admitting: Family Medicine

## 2020-12-11 MED ORDER — TEMAZEPAM 30 MG PO CAPS
ORAL_CAPSULE | ORAL | 0 refills | Status: DC
Start: 2020-12-11 — End: 2021-01-10

## 2020-12-11 NOTE — Telephone Encounter (Signed)
Patient notified

## 2020-12-11 NOTE — Telephone Encounter (Signed)
done

## 2020-12-15 NOTE — Telephone Encounter (Signed)
Patient has appointment on 5/25 for medication follow up

## 2020-12-17 DIAGNOSIS — M4316 Spondylolisthesis, lumbar region: Secondary | ICD-10-CM | POA: Diagnosis not present

## 2020-12-17 DIAGNOSIS — M5416 Radiculopathy, lumbar region: Secondary | ICD-10-CM | POA: Diagnosis not present

## 2020-12-17 DIAGNOSIS — G894 Chronic pain syndrome: Secondary | ICD-10-CM | POA: Diagnosis not present

## 2020-12-20 ENCOUNTER — Emergency Department (HOSPITAL_COMMUNITY): Payer: Medicare Other

## 2020-12-20 ENCOUNTER — Encounter (HOSPITAL_COMMUNITY): Payer: Self-pay

## 2020-12-20 ENCOUNTER — Emergency Department (HOSPITAL_COMMUNITY)
Admission: EM | Admit: 2020-12-20 | Discharge: 2020-12-20 | Disposition: A | Discharge status: Home / Self Care | Payer: Medicare Other | Attending: Emergency Medicine | Admitting: Emergency Medicine

## 2020-12-20 ENCOUNTER — Other Ambulatory Visit: Payer: Self-pay

## 2020-12-20 DIAGNOSIS — W19XXXA Unspecified fall, initial encounter: Secondary | ICD-10-CM | POA: Insufficient documentation

## 2020-12-20 DIAGNOSIS — I959 Hypotension, unspecified: Secondary | ICD-10-CM | POA: Diagnosis not present

## 2020-12-20 DIAGNOSIS — E039 Hypothyroidism, unspecified: Secondary | ICD-10-CM | POA: Diagnosis not present

## 2020-12-20 DIAGNOSIS — Y92002 Bathroom of unspecified non-institutional (private) residence single-family (private) house as the place of occurrence of the external cause: Secondary | ICD-10-CM | POA: Insufficient documentation

## 2020-12-20 DIAGNOSIS — Z79899 Other long term (current) drug therapy: Secondary | ICD-10-CM | POA: Insufficient documentation

## 2020-12-20 DIAGNOSIS — D72829 Elevated white blood cell count, unspecified: Secondary | ICD-10-CM | POA: Insufficient documentation

## 2020-12-20 DIAGNOSIS — R52 Pain, unspecified: Secondary | ICD-10-CM | POA: Diagnosis not present

## 2020-12-20 DIAGNOSIS — S2232XA Fracture of one rib, left side, initial encounter for closed fracture: Secondary | ICD-10-CM

## 2020-12-20 DIAGNOSIS — S2242XA Multiple fractures of ribs, left side, initial encounter for closed fracture: Secondary | ICD-10-CM | POA: Diagnosis not present

## 2020-12-20 DIAGNOSIS — J986 Disorders of diaphragm: Secondary | ICD-10-CM | POA: Diagnosis not present

## 2020-12-20 DIAGNOSIS — I10 Essential (primary) hypertension: Secondary | ICD-10-CM | POA: Diagnosis not present

## 2020-12-20 DIAGNOSIS — R0902 Hypoxemia: Secondary | ICD-10-CM | POA: Diagnosis not present

## 2020-12-20 DIAGNOSIS — R519 Headache, unspecified: Secondary | ICD-10-CM | POA: Diagnosis not present

## 2020-12-20 DIAGNOSIS — R000 Tachycardia, unspecified: Secondary | ICD-10-CM | POA: Diagnosis not present

## 2020-12-20 DIAGNOSIS — E1165 Type 2 diabetes mellitus with hyperglycemia: Secondary | ICD-10-CM | POA: Insufficient documentation

## 2020-12-20 DIAGNOSIS — S299XXA Unspecified injury of thorax, initial encounter: Secondary | ICD-10-CM | POA: Diagnosis present

## 2020-12-20 DIAGNOSIS — J45909 Unspecified asthma, uncomplicated: Secondary | ICD-10-CM | POA: Insufficient documentation

## 2020-12-20 LAB — COMPREHENSIVE METABOLIC PANEL
ALT: 26 U/L (ref 0–44)
AST: 24 U/L (ref 15–41)
Albumin: 3.6 g/dL (ref 3.5–5.0)
Alkaline Phosphatase: 100 U/L (ref 38–126)
Anion gap: 9 (ref 5–15)
BUN: 25 mg/dL — ABNORMAL HIGH (ref 8–23)
CO2: 30 mmol/L (ref 22–32)
Calcium: 8.9 mg/dL (ref 8.9–10.3)
Chloride: 97 mmol/L — ABNORMAL LOW (ref 98–111)
Creatinine, Ser: 0.66 mg/dL (ref 0.44–1.00)
GFR, Estimated: >60 mL/min (ref >60)
Glucose, Bld: 152 mg/dL — ABNORMAL HIGH (ref 70–99)
Potassium: 4.1 mmol/L (ref 3.5–5.1)
Sodium: 136 mmol/L (ref 135–145)
Total Bilirubin: 0.6 mg/dL (ref 0.3–1.2)
Total Protein: 6.7 g/dL (ref 6.5–8.1)

## 2020-12-20 LAB — CBC WITH DIFFERENTIAL/PLATELET
Abs Immature Granulocytes: 0.17 10*3/uL — ABNORMAL HIGH (ref 0.00–0.07)
Basophils Absolute: 0.1 10*3/uL (ref 0.0–0.1)
Basophils Relative: 0 %
Eosinophils Absolute: 0.1 10*3/uL (ref 0.0–0.5)
Eosinophils Relative: 0 %
HCT: 43.9 % (ref 36.0–46.0)
Hemoglobin: 14.3 g/dL (ref 12.0–15.0)
Immature Granulocytes: 1 %
Lymphocytes Relative: 11 %
Lymphs Abs: 1.7 10*3/uL (ref 0.7–4.0)
MCH: 31.8 pg (ref 26.0–34.0)
MCHC: 32.6 g/dL (ref 30.0–36.0)
MCV: 97.6 fL (ref 80.0–100.0)
Monocytes Absolute: 1.1 10*3/uL — ABNORMAL HIGH (ref 0.1–1.0)
Monocytes Relative: 7 %
Neutro Abs: 13 10*3/uL — ABNORMAL HIGH (ref 1.7–7.7)
Neutrophils Relative %: 81 %
Platelets: 211 10*3/uL (ref 150–400)
RBC: 4.5 MIL/uL (ref 3.87–5.11)
RDW: 12.7 % (ref 11.5–15.5)
WBC: 16.1 10*3/uL — ABNORMAL HIGH (ref 4.0–10.5)
nRBC: 0 % (ref 0.0–0.2)

## 2020-12-20 LAB — URINALYSIS, ROUTINE W REFLEX MICROSCOPIC
Bacteria, UA: NONE SEEN
Bilirubin Urine: NEGATIVE
Glucose, UA: NEGATIVE mg/dL
Ketones, ur: NEGATIVE mg/dL
Leukocytes,Ua: NEGATIVE
Nitrite: NEGATIVE
Protein, ur: NEGATIVE mg/dL
Specific Gravity, Urine: 1.016 (ref 1.005–1.030)
pH: 5 (ref 5.0–8.0)

## 2020-12-20 LAB — TROPONIN I (HIGH SENSITIVITY): Troponin I (High Sensitivity): 9 ng/L (ref <18)

## 2020-12-20 MED ORDER — MORPHINE SULFATE (PF) 2 MG/ML IV SOLN: 2.0000 mg | Freq: Once | INTRAVENOUS | Status: DC

## 2020-12-20 MED ORDER — MORPHINE SULFATE (PF) 2 MG/ML IV SOLN
2.0000 mg | Freq: Once | INTRAVENOUS | Status: AC
  Administered 2020-12-20: 2 mg via INTRAVENOUS
  Filled 2020-12-20: qty 1

## 2020-12-20 MED ORDER — MORPHINE SULFATE (PF) 4 MG/ML IV SOLN
4.0000 mg | Freq: Once | INTRAVENOUS | Status: AC
  Administered 2020-12-20: 4 mg via INTRAVENOUS
  Filled 2020-12-20: qty 1

## 2020-12-20 MED ORDER — OXYCODONE-ACETAMINOPHEN 5-325 MG PO TABS
1.0000 | ORAL_TABLET | Freq: Three times a day (TID) | ORAL | 0 refills | Status: AC | PRN
Start: 2020-12-20 — End: 2020-12-24

## 2020-12-20 NOTE — ED Provider Notes (Signed)
[facility] EMERGENCY DEPARTMENT Provider Note   CSN: [id number] Arrival date & time: 12/20/20  7619     History Chief Complaint  Patient presents with  . Fall    Deanna Salinas is a 85 y.o. female.  HPI   Patient with significant medical history of hypertension, hyperlipidemia, type 2 diabetes presents to the emergency department after having a fall last night.  Patient endorses that she woke up this morning lying on her left side beside her bathroom.  She does not know how she got there or how she fell, she thinks that she may have gotten up in the night to use the restroom and then fell on the way back.  She is unsure whether or not she hit her head, loss conscious, she is not on anticoagulant.  She denies headaches, change in vision, paresthesias or weakness in the upper or lower extremities, she denies neck or back pain, but does endorse that she is having left-sided rib pain, hurts when she touches, denies pleuritic chest pain or difficulty breathing.  Patient states she has not ambulated since her fall, states that EMS picked her up and place her on the gurney.  She has no other symptoms at this time.  Patient denies headaches, fevers, chills, chest pain, abdominal pain, nausea, vomiting, diarrhea, or some pedal edema.  Past Medical History:  Diagnosis Date  . Essential hypertension   . Fatty liver   . Hyperlipidemia   . Type 2 diabetes mellitus [facility])     Patient Active Problem List   Diagnosis Date Noted  . Bronchitis   . Respiratory distress 06/04/2020  . Hypoxia 06/04/2020  . Reactive airway disease 06/04/2020  . Primary osteoarthritis of both hands 01/23/2018  . Primary osteoarthritis of both knees 01/23/2018  . Primary osteoarthritis of both feet 01/23/2018  . Other idiopathic scoliosis, lumbar region 01/23/2018  . DDD (degenerative disc disease), lumbar 01/23/2018  . Hypothyroidism 08/20/2017  . Morbid obesity ([place]) 12/11/2016  . Leukocytosis 06/18/2015  . Type  2 diabetes mellitus ([place]) 06/16/2015  . Abdominal pain, epigastric 02/18/2014  . Elevated lipase 02/18/2014  . Pancreatitis, acute 12/11/2013  . POSTMENOPAUSAL OSTEOPOROSIS 02/17/2009  . Osteoarthritis 03/29/2008  . Chronic insomnia 09/14/2007  . Hyperlipidemia 08/26/2006  . DEPRESSION 08/26/2006  . MIGRAINE HEADACHE 08/26/2006  . CATARACT NOS 08/26/2006  . Essential hypertension 08/26/2006  . SCIATICA 08/26/2006    Past Surgical History:  Procedure Laterality Date  . APPENDECTOMY     At the time of hysterectomy  . CESAREAN SECTION     X5  . COLONOSCOPY    . COLONOSCOPY N/A 06/11/2013   Colonic diverticulosis, hyperplastic polyps. no further screening colonoscopies recommended  . ESOPHAGOGASTRODUODENOSCOPY N/A 02/20/2014   Procedure: ESOPHAGOGASTRODUODENOSCOPY (EGD);  Surgeon: Daneil Dolin, MD;  Location: [facility] ENDO SUITE;  Service: Endoscopy;  Laterality: N/A;  11:00     OB History   No obstetric history on file.     Family History  Problem Relation Age of Onset  . Heart Problems Father   . Hypertension Other   . Hypertension Other   . Cancer Mother   . Cancer Daughter        breast   . Colon cancer Neg Hx     Social History   Tobacco Use  . Smoking status: Never Smoker  . Smokeless tobacco: Never Used  Vaping Use  . Vaping Use: Never used  Substance Use Topics  . Alcohol use: Yes    Alcohol/week: 3.0  standard drinks    Types: 3 Glasses of wine per week    Comment: occasional  . Drug use: No    Home Medications Prior to Admission medications   Medication Sig Start Date End Date Taking? Authorizing Provider  oxyCODONE-acetaminophen (PERCOCET/ROXICET) 5-325 MG tablet Take 1 tablet by mouth every 8 (eight) hours as needed for up to 4 days for severe pain. 12/20/20 12/24/20 Yes Marcello Fennel, PA-C  albuterol (VENTOLIN HFA) 108 (90 Base) MCG/ACT inhaler Inhale 2 puffs into the lungs every 6 (six) hours as needed for wheezing or shortness of breath. 06/05/20    Barton Dubois, MD  amLODipine (NORVASC) 2.5 MG tablet TAKE ONE TABLET BY MOUTH ONCE DAILY. 08/26/20   Kathyrn Drown, MD  BAYER CONTOUR NEXT TEST test strip USE AS DIRECTED 07/28/16   Luking, Elayne Snare, MD  guaiFENesin-dextromethorphan (ROBITUSSIN DM) 100-10 MG/5ML syrup Take 5 mLs by mouth every 4 (four) hours as needed for cough. 06/05/20   Barton Dubois, MD  nortriptyline (PAMELOR) 10 MG capsule TAKE 1 TO 2 CAPSULES BY MOUTH AT BEDTIME AS DIRECTED. 09/08/20   Kathyrn Drown, MD  Omega-3 Fatty Acids (FISH OIL PO) Take 1 capsule by mouth daily.     [provider]  quinapril (ACCUPRIL) 20 MG tablet TAKE (1) TABLET BY MOUTH ONCE DAILY. 12/10/20   Kathyrn Drown, MD  temazepam (RESTORIL) 30 MG capsule TAKE 1 CAPSULE BY MOUTH AT BEDTIME AS NEEDED FOR SLEEP. 12/11/20   Kathyrn Drown, MD  torsemide (DEMADEX) 20 MG tablet Take 1 tablet (20 mg total) by mouth daily. Patient not taking: Reported on 06/23/2020 03/28/20 06/26/20  Ahmed Prima, Fransisco Hertz, PA-C  TURMERIC PO Take 1 capsule by mouth daily.     [provider]    Allergies    Ambien [zolpidem tartrate], Lipitor [atorvastatin], Metformin and related, Pravastatin, and Trazodone and nefazodone  Review of Systems   Review of Systems  Constitutional: Negative for chills and fever.  HENT: Negative for congestion and sore throat.   Respiratory: Negative for shortness of breath.   Cardiovascular: Negative for chest pain.  Gastrointestinal: Negative for abdominal pain, constipation, nausea and vomiting.  Genitourinary: Negative for enuresis and flank pain.  Musculoskeletal: Negative for back pain.       Left rib pain.  Skin: Negative for rash.  Neurological: Negative for headaches.  Hematological: Does not bruise/bleed easily.    Physical Exam Updated Vital Signs BP (!) 177/125   Pulse 90   Temp (!) 97.5 F (36.4 C) (Oral)   Resp 17   Ht 5' 5"  (1.651 m)   Wt 109 kg   SpO2 (!) 87%   BMI 39.99 kg/m   Physical  Exam Vitals and nursing note reviewed.  Constitutional:      General: She is not in acute distress.    Appearance: She is not ill-appearing.  HENT:     Head: Normocephalic and atraumatic.     Comments: Head was palpated nontender to palpation, no battle sign or raccoon eyes noted    Nose: No congestion.  Eyes:     Extraocular Movements: Extraocular movements intact.     Conjunctiva/sclera: Conjunctivae normal.     Pupils: Pupils are equal, round, and reactive to light.  Cardiovascular:     Rate and Rhythm: Normal rate and regular rhythm.     Pulses: Normal pulses.     Heart sounds: No murmur heard. No friction rub. No gallop.   Pulmonary:  Effort: No respiratory distress.     Breath sounds: No wheezing, rhonchi or rales.  Abdominal:     Palpations: Abdomen is soft.     Tenderness: There is no abdominal tenderness. There is no right CVA tenderness or left CVA tenderness.  Musculoskeletal:     Cervical back: No rigidity.     Comments: Patient's chest was palpated she is notably tender along the fifth and sixth rib on the anterior lateral aspect, no deformities noted, no flail chest sign present.  Patient is moving all 4 extremities, has 5 of 5 strength, neurovascular fully intact  .there is no noted leg shortening or internal or external rotation  Skin:    General: Skin is warm and dry.  Neurological:     Mental Status: She is alert.     GCS: GCS eye subscore is 4. GCS verbal subscore is 5. GCS motor subscore is 6.     Motor: No weakness.     Coordination: Romberg sign negative. Finger-Nose-Finger Test normal.     Comments: Current nerves II through XII are grossly intact.  Patient have no difficulty word finding.  Psychiatric:        Mood and Affect: Mood normal.     ED Results / Procedures / Treatments   Labs (all labs ordered are listed, but only abnormal results are displayed) Labs Reviewed  CBC WITH DIFFERENTIAL/PLATELET - Abnormal; Notable for the following  components:      Result Value   WBC 16.1 (*)    Neutro Abs 13.0 (*)    Monocytes Absolute 1.1 (*)    Abs Immature Granulocytes 0.17 (*)    All other components within normal limits  COMPREHENSIVE METABOLIC PANEL - Abnormal; Notable for the following components:   Chloride 97 (*)    Glucose, Bld 152 (*)    BUN 25 (*)    All other components within normal limits  URINALYSIS, ROUTINE W REFLEX MICROSCOPIC - Abnormal; Notable for the following components:   Hgb urine dipstick MODERATE (*)    All other components within normal limits  TROPONIN I (HIGH SENSITIVITY)    EKG None  Radiology DG Ribs Unilateral W/Chest Left  Result Date: 12/20/2020 CLINICAL DATA:  Left rib tenderness after fall EXAM: LEFT RIBS AND CHEST - 3+ VIEW COMPARISON:  06/04/2020 FINDINGS: Nondisplaced left sixth rib fracture. Chronic elevation of the left diaphragm with overlying scarring. There is no edema, consolidation, effusion, or pneumothorax. Normal heart size and aortic contours IMPRESSION: Nondisplaced left sixth rib fracture. Electronically Signed   By: Monte Fantasia M.D.   On: 12/20/2020 10:07   CT Head Wo Contrast  Result Date: 12/20/2020 CLINICAL DATA:  Left-sided pain post fall EXAM: CT HEAD WITHOUT CONTRAST TECHNIQUE: Contiguous axial images were obtained from the base of the skull through the vertex without intravenous contrast. COMPARISON:  None. FINDINGS: Brain: Mild atrophy. No evidence of acute infarction, hemorrhage, hydrocephalus, extra-axial collection or mass lesion/mass effect. Vascular: Atherosclerotic and physiologic intracranial calcifications. Skull: Normal. Negative for fracture or focal lesion. Sinuses/Orbits: Hypoplastic frontal sinuses.  Otherwise negative. Other: None IMPRESSION: No acute findings Electronically Signed   By: Lucrezia Europe M.D.   On: 12/20/2020 10:17    Procedures Procedures   Medications Ordered in ED Medications  morphine 2 MG/ML injection 2 mg (2 mg Intravenous Given  12/20/20 1108)  morphine 4 MG/ML injection 4 mg (4 mg Intravenous Given 12/20/20 1154)    ED Course  I have reviewed the triage vital signs and the  nursing notes.  Pertinent labs & imaging results that were available during my care of the patient were reviewed by me and considered in my medical decision making (see chart for details).    MDM Rules/Calculators/A&P                         Initial impression-patient presents with left-sided rib pain after a fall.  She is alert, does not appear in acute distress, vital signs reassuring.  Concern for rib fracture, will obtain chest x-ray for further evaluation, also obtain CT head, and basic lab work-up as she does not remember why or how she fell.  Work-up-CBC shows leukocytosis of 16.1, CMP shows hyperglycemia 152, BUN of 25, troponin 9, CT head negative for acute findings.  Chest x-ray shows nondisplaced sixth rib.  EKG sinus rhythm not signs of ischemia  Reassessment patient was endorsing right-sided rib pain, provide patient with morphine.  Patient was reassessed after this she continued of pain will provide patient with another dose of morphine and reassess.  Patient was reassessed, has no complaints this time, states her pain is much better.  Patient is agreeable for discharge at this time.  Rule out-low suspicion for intracranial head bleed and/or mass as there is no neurodeficits present my exam, head CT is negative for acute findings.  Low suspicion for spinal cord abnormality or spinal fracture as patient denies neck or back pain, she is moving all 4 extremities out difficulty.  Low suspicion for pneumothorax as lung sounds are clear bilaterally, x-rays negative for this finding.  Low suspicion for ACS or arrhythmias as patient denies chest pain, shortness of breath, EKG sinus rhythm without signs of ischemia, first troponin is 9, will defer second troponin as patient been chest pain-free for greater than 12 hours, if ACS was present would  expect elevation at this time.  Low suspicion for intra-abdominal trauma as abdomen soft nontender palpation, liver enzymes, alk phos, kidney function all within normal limits.  Low suspicion for UTI as urine is negative for signs of infection.  Suspicion for systemic infection as patient is nontoxic-appearing, vital signs reassuring.  Patient does have noted leukocytosis but I suspect this more acute phase reactant.  Plan-  1.  Fall-suspect mechanical in nature as patient has difficulty with mobility.  will have her follow-up PCP for further evaluation.  2.  Left rib pain suspect secondary due to rib fracture will provide patient with incentive spirometry, however follow-up with PCP in 3-week time for reevaluation.  Vital signs have remained stable, no indication for hospital admission.  Patient discussed with attending and they agreed with assessment and plan.  Patient given at home care as well strict return precautions.  Patient verbalized that they understood agreed to said plan.   Final Clinical Impression(s) / ED Diagnoses Final diagnoses:  Fall, initial encounter  Closed fracture of one rib of left side, initial encounter    Rx / DC Orders ED Discharge Orders         Ordered    oxyCODONE-acetaminophen (PERCOCET/ROXICET) 5-325 MG tablet  Every 8 hours PRN        12/20/20 1325           Aron Baba 12/20/20 1330    Milton Ferguson, MD 12/20/20 1451

## 2020-12-20 NOTE — Discharge Instructions (Addendum)
You have a left-sided rib fracture, I have given you a incentive spirometer please use 3 times daily for next 3 weeks as this will help prevent pneumonia.  I have also given you narcotics oxycodone, I want you to take oxycodone or the tramadol for your pain do not take both.  This medication can make you drowsy do not consume alcohol or operate heavy machinery when taking this medication.  This medication has Tylenol in it do not take Tylenol when taking this medication.  Please follow-up with your PCP in 3 weeks time for repeat chest x-ray.  Come back to the emergency department if you develop chest pain, shortness of breath, severe abdominal pain, uncontrolled nausea, vomiting, diarrhea.

## 2020-12-20 NOTE — ED Triage Notes (Signed)
Patient fell in bathroom this morning around 0300, found by husband around 0700 on floor, awake and oriented, c/o left side pain, denies hitting head, patient very HOH.

## 2020-12-29 ENCOUNTER — Other Ambulatory Visit: Payer: Self-pay | Admitting: Family Medicine

## 2020-12-31 ENCOUNTER — Ambulatory Visit: Payer: Medicare Other | Admitting: Family Medicine

## 2021-01-07 ENCOUNTER — Other Ambulatory Visit: Payer: Self-pay

## 2021-01-07 ENCOUNTER — Telehealth: Payer: Medicare Other | Admitting: Family Medicine

## 2021-01-09 ENCOUNTER — Other Ambulatory Visit: Payer: Self-pay | Admitting: Family Medicine

## 2021-01-10 ENCOUNTER — Emergency Department (HOSPITAL_COMMUNITY): Payer: Medicare Other

## 2021-01-10 ENCOUNTER — Encounter (HOSPITAL_COMMUNITY): Payer: Self-pay | Admitting: Internal Medicine

## 2021-01-10 ENCOUNTER — Inpatient Hospital Stay (HOSPITAL_COMMUNITY)
Admission: EM | Admit: 2021-01-10 | Discharge: 2021-01-19 | DRG: 871 | Disposition: A | Discharge status: Skilled Nursing Facility | Payer: Medicare Other | Attending: Internal Medicine | Admitting: Internal Medicine

## 2021-01-10 DIAGNOSIS — E785 Hyperlipidemia, unspecified: Secondary | ICD-10-CM | POA: Diagnosis not present

## 2021-01-10 DIAGNOSIS — J449 Chronic obstructive pulmonary disease, unspecified: Secondary | ICD-10-CM | POA: Diagnosis present

## 2021-01-10 DIAGNOSIS — R000 Tachycardia, unspecified: Secondary | ICD-10-CM | POA: Diagnosis not present

## 2021-01-10 DIAGNOSIS — S2249XA Multiple fractures of ribs, unspecified side, initial encounter for closed fracture: Secondary | ICD-10-CM | POA: Diagnosis present

## 2021-01-10 DIAGNOSIS — F32A Depression, unspecified: Secondary | ICD-10-CM | POA: Diagnosis present

## 2021-01-10 DIAGNOSIS — Z885 Allergy status to narcotic agent status: Secondary | ICD-10-CM | POA: Diagnosis not present

## 2021-01-10 DIAGNOSIS — F10239 Alcohol dependence with withdrawal, unspecified: Secondary | ICD-10-CM | POA: Diagnosis not present

## 2021-01-10 DIAGNOSIS — G4700 Insomnia, unspecified: Secondary | ICD-10-CM | POA: Diagnosis not present

## 2021-01-10 DIAGNOSIS — S2242XA Multiple fractures of ribs, left side, initial encounter for closed fracture: Secondary | ICD-10-CM | POA: Diagnosis present

## 2021-01-10 DIAGNOSIS — E1169 Type 2 diabetes mellitus with other specified complication: Secondary | ICD-10-CM | POA: Diagnosis present

## 2021-01-10 DIAGNOSIS — Z79899 Other long term (current) drug therapy: Secondary | ICD-10-CM

## 2021-01-10 DIAGNOSIS — R079 Chest pain, unspecified: Secondary | ICD-10-CM | POA: Diagnosis not present

## 2021-01-10 DIAGNOSIS — R7881 Bacteremia: Secondary | ICD-10-CM

## 2021-01-10 DIAGNOSIS — Z8249 Family history of ischemic heart disease and other diseases of the circulatory system: Secondary | ICD-10-CM | POA: Diagnosis not present

## 2021-01-10 DIAGNOSIS — I4510 Unspecified right bundle-branch block: Secondary | ICD-10-CM | POA: Diagnosis not present

## 2021-01-10 DIAGNOSIS — R0609 Other forms of dyspnea: Secondary | ICD-10-CM | POA: Diagnosis not present

## 2021-01-10 DIAGNOSIS — B9620 Unspecified Escherichia coli [E. coli] as the cause of diseases classified elsewhere: Secondary | ICD-10-CM | POA: Diagnosis not present

## 2021-01-10 DIAGNOSIS — N179 Acute kidney failure, unspecified: Secondary | ICD-10-CM | POA: Diagnosis present

## 2021-01-10 DIAGNOSIS — R0902 Hypoxemia: Secondary | ICD-10-CM | POA: Diagnosis not present

## 2021-01-10 DIAGNOSIS — E119 Type 2 diabetes mellitus without complications: Secondary | ICD-10-CM | POA: Diagnosis not present

## 2021-01-10 DIAGNOSIS — E7849 Other hyperlipidemia: Secondary | ICD-10-CM | POA: Diagnosis not present

## 2021-01-10 DIAGNOSIS — J9601 Acute respiratory failure with hypoxia: Secondary | ICD-10-CM | POA: Diagnosis present

## 2021-01-10 DIAGNOSIS — Z66 Do not resuscitate: Secondary | ICD-10-CM | POA: Diagnosis present

## 2021-01-10 DIAGNOSIS — J181 Lobar pneumonia, unspecified organism: Secondary | ICD-10-CM | POA: Diagnosis present

## 2021-01-10 DIAGNOSIS — R778 Other specified abnormalities of plasma proteins: Secondary | ICD-10-CM | POA: Diagnosis present

## 2021-01-10 DIAGNOSIS — R519 Headache, unspecified: Secondary | ICD-10-CM | POA: Diagnosis not present

## 2021-01-10 DIAGNOSIS — J189 Pneumonia, unspecified organism: Secondary | ICD-10-CM

## 2021-01-10 DIAGNOSIS — G9341 Metabolic encephalopathy: Secondary | ICD-10-CM

## 2021-01-10 DIAGNOSIS — A4151 Sepsis due to Escherichia coli [E. coli]: Principal | ICD-10-CM | POA: Diagnosis present

## 2021-01-10 DIAGNOSIS — F5104 Psychophysiologic insomnia: Secondary | ICD-10-CM | POA: Diagnosis present

## 2021-01-10 DIAGNOSIS — Z888 Allergy status to other drugs, medicaments and biological substances status: Secondary | ICD-10-CM

## 2021-01-10 DIAGNOSIS — Z20822 Contact with and (suspected) exposure to covid-19: Secondary | ICD-10-CM | POA: Diagnosis present

## 2021-01-10 DIAGNOSIS — R0789 Other chest pain: Secondary | ICD-10-CM | POA: Diagnosis not present

## 2021-01-10 DIAGNOSIS — W1830XA Fall on same level, unspecified, initial encounter: Secondary | ICD-10-CM | POA: Diagnosis present

## 2021-01-10 DIAGNOSIS — I4891 Unspecified atrial fibrillation: Secondary | ICD-10-CM | POA: Diagnosis not present

## 2021-01-10 DIAGNOSIS — F419 Anxiety disorder, unspecified: Secondary | ICD-10-CM | POA: Diagnosis present

## 2021-01-10 DIAGNOSIS — Z90710 Acquired absence of both cervix and uterus: Secondary | ICD-10-CM

## 2021-01-10 DIAGNOSIS — A419 Sepsis, unspecified organism: Secondary | ICD-10-CM | POA: Diagnosis not present

## 2021-01-10 DIAGNOSIS — J90 Pleural effusion, not elsewhere classified: Secondary | ICD-10-CM | POA: Diagnosis not present

## 2021-01-10 DIAGNOSIS — H9190 Unspecified hearing loss, unspecified ear: Secondary | ICD-10-CM | POA: Diagnosis present

## 2021-01-10 DIAGNOSIS — J9811 Atelectasis: Secondary | ICD-10-CM | POA: Diagnosis not present

## 2021-01-10 DIAGNOSIS — I10 Essential (primary) hypertension: Secondary | ICD-10-CM | POA: Diagnosis present

## 2021-01-10 DIAGNOSIS — M549 Dorsalgia, unspecified: Secondary | ICD-10-CM | POA: Diagnosis not present

## 2021-01-10 DIAGNOSIS — S2242XD Multiple fractures of ribs, left side, subsequent encounter for fracture with routine healing: Secondary | ICD-10-CM | POA: Diagnosis not present

## 2021-01-10 DIAGNOSIS — G4709 Other insomnia: Secondary | ICD-10-CM | POA: Diagnosis not present

## 2021-01-10 DIAGNOSIS — E8770 Fluid overload, unspecified: Secondary | ICD-10-CM | POA: Diagnosis present

## 2021-01-10 LAB — BLOOD CULTURE ID PANEL (REFLEXED) - BCID2

## 2021-01-10 LAB — TROPONIN I (HIGH SENSITIVITY)
Troponin I (High Sensitivity): 282 ng/L (ref <18)
Troponin I (High Sensitivity): 203 ng/L (ref <18)
Troponin I (High Sensitivity): 100 ng/L (ref <18)
Troponin I (High Sensitivity): 103 ng/L (ref <18)

## 2021-01-10 LAB — COMPREHENSIVE METABOLIC PANEL
ALT: 17 U/L (ref 0–44)
AST: 24 U/L (ref 15–41)
Albumin: 2.5 g/dL — ABNORMAL LOW (ref 3.5–5.0)
Alkaline Phosphatase: 113 U/L (ref 38–126)
Anion gap: 11 (ref 5–15)
BUN: 24 mg/dL — ABNORMAL HIGH (ref 8–23)
CO2: 24 mmol/L (ref 22–32)
Calcium: 8 mg/dL — ABNORMAL LOW (ref 8.9–10.3)
Chloride: 101 mmol/L (ref 98–111)
Creatinine, Ser: 1.32 mg/dL — ABNORMAL HIGH (ref 0.44–1.00)
GFR, Estimated: 40 mL/min — ABNORMAL LOW (ref >60)
Glucose, Bld: 175 mg/dL — ABNORMAL HIGH (ref 70–99)
Potassium: 3.7 mmol/L (ref 3.5–5.1)
Sodium: 136 mmol/L (ref 135–145)
Total Bilirubin: 0.8 mg/dL (ref 0.3–1.2)
Total Protein: 5.4 g/dL — ABNORMAL LOW (ref 6.5–8.1)

## 2021-01-10 LAB — PROCALCITONIN: Procalcitonin: 4.7 ng/mL

## 2021-01-10 LAB — CBC WITH DIFFERENTIAL/PLATELET
Abs Immature Granulocytes: 0.31 10*3/uL — ABNORMAL HIGH (ref 0.00–0.07)
Basophils Absolute: 0.1 10*3/uL (ref 0.0–0.1)
Basophils Relative: 0 %
Eosinophils Absolute: 0 10*3/uL (ref 0.0–0.5)
Eosinophils Relative: 0 %
HCT: 40.9 % (ref 36.0–46.0)
Hemoglobin: 13.2 g/dL (ref 12.0–15.0)
Immature Granulocytes: 2 %
Lymphocytes Relative: 4 %
Lymphs Abs: 0.9 10*3/uL (ref 0.7–4.0)
MCH: 31.6 pg (ref 26.0–34.0)
MCHC: 32.3 g/dL (ref 30.0–36.0)
MCV: 97.8 fL (ref 80.0–100.0)
Monocytes Absolute: 1.7 10*3/uL — ABNORMAL HIGH (ref 0.1–1.0)
Monocytes Relative: 8 %
Neutro Abs: 17.2 10*3/uL — ABNORMAL HIGH (ref 1.7–7.7)
Neutrophils Relative %: 86 %
Platelets: 135 10*3/uL — ABNORMAL LOW (ref 150–400)
RBC: 4.18 MIL/uL (ref 3.87–5.11)
RDW: 14.2 % (ref 11.5–15.5)
WBC: 20.1 10*3/uL — ABNORMAL HIGH (ref 4.0–10.5)
nRBC: 0 % (ref 0.0–0.2)

## 2021-01-10 LAB — PROTIME-INR
INR: 1.2 (ref 0.8–1.2)
Prothrombin Time: 14.9 seconds (ref 11.4–15.2)

## 2021-01-10 LAB — GLUCOSE, CAPILLARY
Glucose-Capillary: 140 mg/dL — ABNORMAL HIGH (ref 70–99)
Glucose-Capillary: 145 mg/dL — ABNORMAL HIGH (ref 70–99)
Glucose-Capillary: 146 mg/dL — ABNORMAL HIGH (ref 70–99)
Glucose-Capillary: 140 mg/dL — ABNORMAL HIGH (ref 70–99)

## 2021-01-10 LAB — APTT: aPTT: 29 seconds (ref 24–36)

## 2021-01-10 LAB — RESP PANEL BY RT-PCR (FLU A&B, COVID) ARPGX2
Influenza A by PCR: NEGATIVE
Influenza B by PCR: NEGATIVE
SARS Coronavirus 2 by RT PCR: NEGATIVE

## 2021-01-10 LAB — MAGNESIUM: Magnesium: 1.5 mg/dL — ABNORMAL LOW (ref 1.7–2.4)

## 2021-01-10 LAB — LACTIC ACID, PLASMA
Lactic Acid, Venous: 1.5 mmol/L (ref 0.5–1.9)
Lactic Acid, Venous: 1.5 mmol/L (ref 0.5–1.9)

## 2021-01-10 MED ORDER — DOCUSATE SODIUM 100 MG PO CAPS
100.0000 mg | ORAL_CAPSULE | Freq: Two times a day (BID) | ORAL | Status: DC
  Administered 2021-01-10 – 2021-01-19 (×18): 100 mg via ORAL
  Filled 2021-01-10 (×19): qty 1

## 2021-01-10 MED ORDER — NORTRIPTYLINE HCL 10 MG PO CAPS
10.0000 mg | ORAL_CAPSULE | Freq: Every day | ORAL | Status: DC
  Administered 2021-01-10 – 2021-01-12 (×3): 10 mg via ORAL
  Filled 2021-01-10 (×4): qty 1

## 2021-01-10 MED ORDER — SODIUM CHLORIDE 0.9 % IV SOLN: INTRAVENOUS | Status: DC

## 2021-01-10 MED ORDER — INSULIN ASPART 100 UNIT/ML IJ SOLN: 0.0000 [IU] | Freq: Every day | INTRAMUSCULAR | Status: DC

## 2021-01-10 MED ORDER — TEMAZEPAM 30 MG PO CAPS
30.0000 mg | ORAL_CAPSULE | Freq: Every day | ORAL | Status: DC
  Administered 2021-01-10 – 2021-01-18 (×9): 30 mg via ORAL
  Filled 2021-01-10 (×9): qty 2

## 2021-01-10 MED ORDER — PHENOL 1.4 % MT LIQD
1.0000 | OROMUCOSAL | Status: DC | PRN
  Administered 2021-01-10: 1 via OROMUCOSAL
  Filled 2021-01-10: qty 177

## 2021-01-10 MED ORDER — HYDRALAZINE HCL 20 MG/ML IJ SOLN: 5.0000 mg | INTRAMUSCULAR | Status: DC | PRN

## 2021-01-10 MED ORDER — TRAMADOL HCL 50 MG PO TABS
25.0000 mg | ORAL_TABLET | Freq: Two times a day (BID) | ORAL | Status: DC | PRN
  Administered 2021-01-11 – 2021-01-12 (×2): 50 mg via ORAL
  Filled 2021-01-10 (×2): qty 1

## 2021-01-10 MED ORDER — BISACODYL 5 MG PO TBEC: 5.0000 mg | DELAYED_RELEASE_TABLET | Freq: Every day | ORAL | Status: DC | PRN

## 2021-01-10 MED ORDER — GUAIFENESIN-DM 100-10 MG/5ML PO SYRP
5.0000 mL | ORAL_SOLUTION | ORAL | Status: DC | PRN
  Administered 2021-01-10: 5 mL via ORAL
  Filled 2021-01-10 (×2): qty 5

## 2021-01-10 MED ORDER — ALBUTEROL SULFATE HFA 108 (90 BASE) MCG/ACT IN AERS: 2.0000 | INHALATION_SPRAY | Freq: Four times a day (QID) | RESPIRATORY_TRACT | Status: DC | PRN

## 2021-01-10 MED ORDER — POLYETHYLENE GLYCOL 3350 17 G PO PACK
17.0000 g | PACK | Freq: Every day | ORAL | Status: DC | PRN
  Administered 2021-01-14: 17 g via ORAL
  Filled 2021-01-10: qty 1

## 2021-01-10 MED ORDER — ALBUTEROL SULFATE (2.5 MG/3ML) 0.083% IN NEBU
2.5000 mg | INHALATION_SOLUTION | RESPIRATORY_TRACT | Status: DC | PRN
  Administered 2021-01-10 – 2021-01-14 (×5): 2.5 mg via RESPIRATORY_TRACT
  Filled 2021-01-10 (×5): qty 3

## 2021-01-10 MED ORDER — SODIUM CHLORIDE 0.9 % IV SOLN
2.0000 g | Freq: Once | INTRAVENOUS | Status: AC
  Administered 2021-01-10: 2 g via INTRAVENOUS
  Filled 2021-01-10: qty 20

## 2021-01-10 MED ORDER — HYDROCODONE-ACETAMINOPHEN 5-325 MG PO TABS
1.0000 | ORAL_TABLET | ORAL | Status: DC | PRN
  Administered 2021-01-10: 2 via ORAL
  Filled 2021-01-10: qty 2

## 2021-01-10 MED ORDER — ACETAMINOPHEN 650 MG RE SUPP
650.0000 mg | Freq: Four times a day (QID) | RECTAL | Status: DC | PRN
  Administered 2021-01-14: 650 mg via RECTAL
  Filled 2021-01-10: qty 1

## 2021-01-10 MED ORDER — ACETAMINOPHEN 500 MG PO TABS
1000.0000 mg | ORAL_TABLET | Freq: Once | ORAL | Status: AC
  Administered 2021-01-10: 1000 mg via ORAL
  Filled 2021-01-10: qty 2

## 2021-01-10 MED ORDER — NITROGLYCERIN 0.4 MG SL SUBL
0.4000 mg | SUBLINGUAL_TABLET | SUBLINGUAL | Status: DC | PRN
  Administered 2021-01-10 (×2): 0.4 mg via SUBLINGUAL
  Filled 2021-01-10: qty 1

## 2021-01-10 MED ORDER — ONDANSETRON HCL 4 MG PO TABS
4.0000 mg | ORAL_TABLET | Freq: Four times a day (QID) | ORAL | Status: DC | PRN
Start: 2021-01-10 — End: 2021-01-19
  Administered 2021-01-10: 4 mg via ORAL
  Filled 2021-01-10: qty 1

## 2021-01-10 MED ORDER — ONDANSETRON HCL 4 MG/2ML IJ SOLN: 4.0000 mg | Freq: Four times a day (QID) | INTRAMUSCULAR | Status: DC | PRN

## 2021-01-10 MED ORDER — MORPHINE SULFATE (PF) 2 MG/ML IV SOLN
2.0000 mg | INTRAVENOUS | Status: DC | PRN
Start: 2021-01-10 — End: 2021-01-11
  Administered 2021-01-10 (×2): 2 mg via INTRAVENOUS
  Filled 2021-01-10 (×2): qty 1

## 2021-01-10 MED ORDER — LIDOCAINE 5 % EX PTCH
1.0000 | MEDICATED_PATCH | CUTANEOUS | Status: DC
  Administered 2021-01-10 – 2021-01-19 (×10): 1 via TRANSDERMAL
  Filled 2021-01-10 (×12): qty 1

## 2021-01-10 MED ORDER — INSULIN ASPART 100 UNIT/ML IJ SOLN
0.0000 [IU] | Freq: Three times a day (TID) | INTRAMUSCULAR | Status: DC
  Administered 2021-01-11: 2 [IU] via SUBCUTANEOUS
  Administered 2021-01-11 (×2): 3 [IU] via SUBCUTANEOUS
  Administered 2021-01-12: 2 [IU] via SUBCUTANEOUS
  Administered 2021-01-12: 3 [IU] via SUBCUTANEOUS
  Administered 2021-01-13 (×2): 2 [IU] via SUBCUTANEOUS
  Administered 2021-01-14 (×3): 3 [IU] via SUBCUTANEOUS
  Administered 2021-01-15: 2 [IU] via SUBCUTANEOUS
  Administered 2021-01-15: 3 [IU] via SUBCUTANEOUS
  Administered 2021-01-15 – 2021-01-16 (×2): 2 [IU] via SUBCUTANEOUS
  Administered 2021-01-16 – 2021-01-17 (×4): 3 [IU] via SUBCUTANEOUS
  Administered 2021-01-18: 2 [IU] via SUBCUTANEOUS

## 2021-01-10 MED ORDER — TEMAZEPAM 15 MG PO CAPS
30.0000 mg | ORAL_CAPSULE | Freq: Every evening | ORAL | Status: DC | PRN
  Administered 2021-01-11: 30 mg via ORAL
  Filled 2021-01-10: qty 2

## 2021-01-10 MED ORDER — SODIUM CHLORIDE 0.9 % IV BOLUS
1000.0000 mL | Freq: Once | INTRAVENOUS | Status: AC
  Administered 2021-01-10: 1000 mL via INTRAVENOUS

## 2021-01-10 MED ORDER — SODIUM CHLORIDE 0.9 % IV SOLN
500.0000 mg | INTRAVENOUS | Status: DC
  Filled 2021-01-10: qty 500

## 2021-01-10 MED ORDER — ACETAMINOPHEN 325 MG PO TABS
650.0000 mg | ORAL_TABLET | Freq: Four times a day (QID) | ORAL | Status: DC | PRN
  Administered 2021-01-11 – 2021-01-18 (×11): 650 mg via ORAL
  Filled 2021-01-10 (×10): qty 2

## 2021-01-10 MED ORDER — ENOXAPARIN SODIUM 40 MG/0.4ML IJ SOSY
40.0000 mg | PREFILLED_SYRINGE | INTRAMUSCULAR | Status: DC
  Administered 2021-01-10 – 2021-01-19 (×10): 40 mg via SUBCUTANEOUS
  Filled 2021-01-10 (×10): qty 0.4

## 2021-01-10 MED ORDER — SODIUM CHLORIDE 0.9 % IV SOLN
500.0000 mg | Freq: Once | INTRAVENOUS | Status: AC
  Administered 2021-01-10: 500 mg via INTRAVENOUS
  Filled 2021-01-10: qty 500

## 2021-01-10 MED ORDER — SODIUM CHLORIDE 0.9 % IV SOLN
2.0000 g | INTRAVENOUS | Status: DC
  Administered 2021-01-11 – 2021-01-13 (×3): 2 g via INTRAVENOUS
  Filled 2021-01-10: qty 2
  Filled 2021-01-10: qty 20
  Filled 2021-01-10: qty 2

## 2021-01-10 NOTE — Progress Notes (Signed)
PT called RN into the room and stated that she was having pressure on the left side of her chest and she was SOB. NT and RN obtaining vitals and EKG. Dr. Lorin Mercy Secure chatted to notify of pt condition

## 2021-01-10 NOTE — Progress Notes (Signed)
RN received verbal order from MD to put in for troponin, lab a bedside. RN and NT at bedside with the patient obtaining the first EKG reading while pt was hooked up her HR jumped to 245 and went back down to 103. RN called Rapid response nurse for second set of eyes, pt was still SOB despite being on 2L and she was only Satting 91 and having a lot of chest pressure. RN Increased pt's O2 to 3L and she was maintaining 96-100%. Saralyn Pilar Rapid Response RN arrived at bedside and we took another EKG because first reading had a lot of artifact. Received verbal order for nitro from Dr. Lorin Mercy and administered 2x and the pt stated relief but still felt some pressure but not as bad as before. Dr. Lorin Mercy arrived at bedside and examined the patient, asked for morphine to be given to the patient to help relieve some of the pain. Morphine administered and the patient stated that she did have some relief, waiting for labs and will continue to monitor the patient. She is currently resting and watching TV states she feels better and just wants to rest.

## 2021-01-10 NOTE — Progress Notes (Addendum)
PHARMACY - PHYSICIAN COMMUNICATION CRITICAL VALUE ALERT - BLOOD CULTURE IDENTIFICATION (BCID)  Deanna Salinas is an 85 y.o. female who presented to [facility] on 01/10/2021 with a chief complaint of SOB   Assessment:  Sepsis due to PNA. Blood cultures with GNR and BCID with Ecoli. No resistance detected.  Name of physician (or Provider) Contacted: Konrad Felix  Current antibiotics: Rocephin, azithromycin  Changes to prescribed antibiotics recommended: d/c azithromycin Recommendations accepted by provider  Results for orders placed or performed during the hospital encounter of 01/10/21  Blood Culture ID Panel (Reflexed) (Collected: 01/10/2021  4:07 AM)  Result Value Ref Range   Enterococcus faecalis NOT DETECTED NOT DETECTED   Enterococcus Faecium NOT DETECTED NOT DETECTED   Listeria monocytogenes NOT DETECTED NOT DETECTED   Staphylococcus species NOT DETECTED NOT DETECTED   Staphylococcus aureus (BCID) NOT DETECTED NOT DETECTED   Staphylococcus epidermidis NOT DETECTED NOT DETECTED   Staphylococcus lugdunensis NOT DETECTED NOT DETECTED   Streptococcus species NOT DETECTED NOT DETECTED   Streptococcus agalactiae NOT DETECTED NOT DETECTED   Streptococcus pneumoniae NOT DETECTED NOT DETECTED   Streptococcus pyogenes NOT DETECTED NOT DETECTED   A.calcoaceticus-baumannii NOT DETECTED NOT DETECTED   Bacteroides fragilis NOT DETECTED NOT DETECTED   Enterobacterales DETECTED (A) NOT DETECTED   Enterobacter cloacae complex NOT DETECTED NOT DETECTED   Escherichia coli DETECTED (A) NOT DETECTED   Klebsiella aerogenes NOT DETECTED NOT DETECTED   Klebsiella oxytoca NOT DETECTED NOT DETECTED   Klebsiella pneumoniae NOT DETECTED NOT DETECTED   Proteus species NOT DETECTED NOT DETECTED   Salmonella species NOT DETECTED NOT DETECTED   Serratia marcescens NOT DETECTED NOT DETECTED   Haemophilus influenzae NOT DETECTED NOT DETECTED   Neisseria meningitidis NOT DETECTED NOT DETECTED   Pseudomonas  aeruginosa NOT DETECTED NOT DETECTED   Stenotrophomonas maltophilia NOT DETECTED NOT DETECTED   Candida albicans NOT DETECTED NOT DETECTED   Candida auris NOT DETECTED NOT DETECTED   Candida glabrata NOT DETECTED NOT DETECTED   Candida krusei NOT DETECTED NOT DETECTED   Candida parapsilosis NOT DETECTED NOT DETECTED   Candida tropicalis NOT DETECTED NOT DETECTED   Cryptococcus neoformans/gattii NOT DETECTED NOT DETECTED   CTX-M ESBL NOT DETECTED NOT DETECTED   Carbapenem resistance IMP NOT DETECTED NOT DETECTED   Carbapenem resistance KPC NOT DETECTED NOT DETECTED   Carbapenem resistance NDM NOT DETECTED NOT DETECTED   Carbapenem resist OXA 48 LIKE NOT DETECTED NOT DETECTED   Carbapenem resistance VIM NOT DETECTED NOT DETECTED   Hildred Laser, PharmD Clinical Pharmacist **Pharmacist phone directory can now be found on [email or web address] ([email or web address]).  Listed under [place].

## 2021-01-10 NOTE — ED Provider Notes (Signed)
[place] EMERGENCY DEPARTMENT Provider Note   CSN: [id number] Arrival date & time: 01/10/21  0241     History No chief complaint on file.   Deanna Salinas is a 85 y.o. female.  85 yo F with a chief complaint of feeling unwell.  Going on for a few days.  All day today she felt very fatigued and felt like she was hot and cold throughout the day.  Has had some mild cough with this.  Has had some chest pain that radiates into the left shoulder.  Has been too fatigued really to get up and move around.  Recently had a fall and suffered a broken rib.  Has been recovering from that at home.  The history is provided by the patient.       Past Medical History:  Diagnosis Date  . Essential hypertension   . Fatty liver   . Hyperlipidemia   . Type 2 diabetes mellitus [facility])     Patient Active Problem List   Diagnosis Date Noted  . CAP (community acquired pneumonia) 01/10/2021  . Bronchitis   . Respiratory distress 06/04/2020  . Hypoxia 06/04/2020  . Reactive airway disease 06/04/2020  . Primary osteoarthritis of both hands 01/23/2018  . Primary osteoarthritis of both knees 01/23/2018  . Primary osteoarthritis of both feet 01/23/2018  . Other idiopathic scoliosis, lumbar region 01/23/2018  . DDD (degenerative disc disease), lumbar 01/23/2018  . Hypothyroidism 08/20/2017  . Morbid obesity ([place]) 12/11/2016  . Leukocytosis 06/18/2015  . Type 2 diabetes mellitus ([place]) 06/16/2015  . Abdominal pain, epigastric 02/18/2014  . Elevated lipase 02/18/2014  . Pancreatitis, acute 12/11/2013  . POSTMENOPAUSAL OSTEOPOROSIS 02/17/2009  . Osteoarthritis 03/29/2008  . Chronic insomnia 09/14/2007  . Hyperlipidemia 08/26/2006  . DEPRESSION 08/26/2006  . MIGRAINE HEADACHE 08/26/2006  . CATARACT NOS 08/26/2006  . Essential hypertension 08/26/2006  . SCIATICA 08/26/2006    Past Surgical History:  Procedure Laterality Date  . APPENDECTOMY     At the time of hysterectomy   . CESAREAN SECTION     X5  . COLONOSCOPY    . COLONOSCOPY N/A 06/11/2013   Colonic diverticulosis, hyperplastic polyps. no further screening colonoscopies recommended  . ESOPHAGOGASTRODUODENOSCOPY N/A 02/20/2014   Procedure: ESOPHAGOGASTRODUODENOSCOPY (EGD);  Surgeon: Daneil Dolin, MD;  Location: [facility];  Service: Endoscopy;  Laterality: N/A;  11:00     OB History   No obstetric history on file.     Family History  Problem Relation Age of Onset  . Heart Problems Father   . Hypertension Other   . Hypertension Other   . Cancer Mother   . Cancer Daughter        breast   . Colon cancer Neg Hx     Social History   Tobacco Use  . Smoking status: Never Smoker  . Smokeless tobacco: Never Used  Vaping Use  . Vaping Use: Never used  Substance Use Topics  . Alcohol use: Yes    Alcohol/week: 3.0 standard drinks    Types: 3 Glasses of wine per week    Comment: occasional  . Drug use: No    Home Medications Prior to Admission medications   Medication Sig Start Date End Date Taking? Authorizing Provider  albuterol (VENTOLIN HFA) 108 (90 Base) MCG/ACT inhaler Inhale 2 puffs into the lungs every 6 (six) hours as needed for wheezing or shortness of breath. 06/05/20  Yes Barton Dubois, MD  amLODipine (NORVASC) 2.5 MG tablet TAKE ONE  TABLET BY MOUTH ONCE DAILY. Patient taking differently: Take 2.5 mg by mouth daily. 08/26/20  Yes Kathyrn Drown, MD  BAYER CONTOUR NEXT TEST test strip USE AS DIRECTED 07/28/16  Yes Luking, Elayne Snare, MD  guaiFENesin-dextromethorphan (ROBITUSSIN DM) 100-10 MG/5ML syrup Take 5 mLs by mouth every 4 (four) hours as needed for cough. 06/05/20  Yes Barton Dubois, MD  Multiple Vitamin (MULTI-VITAMIN DAILY PO) Take 1 tablet by mouth daily.   Yes [provider]  nortriptyline (PAMELOR) 10 MG capsule TAKE 1 TO 2 CAPSULES BY MOUTH AT BEDTIME AS DIRECTED. Patient taking differently: 10 mg at bedtime. 09/08/20  Yes Luking, Elayne Snare, MD  quinapril  (ACCUPRIL) 20 MG tablet TAKE (1) TABLET BY MOUTH ONCE DAILY. Patient taking differently: Take 20 mg by mouth daily. 12/10/20  Yes Luking, Scott A, MD  temazepam (RESTORIL) 30 MG capsule TAKE 1 CAPSULE BY MOUTH AT BEDTIME AS NEEDED FOR SLEEP. Patient taking differently: Take 30 mg by mouth at bedtime. 12/11/20  Yes Kathyrn Drown, MD  traMADol (ULTRAM) 50 MG tablet Take 25-50 mg by mouth 2 (two) times daily as needed. 01/06/21  Yes [provider]  torsemide (DEMADEX) 20 MG tablet Take 1 tablet (20 mg total) by mouth daily. Patient not taking: No sig reported 03/28/20 06/26/20  Erma Heritage, PA-C    Allergies    Ambien [zolpidem tartrate], Lipitor [atorvastatin], Metformin and related, Pravastatin, and Trazodone and nefazodone  Review of Systems   Review of Systems  Constitutional: Positive for chills and fever.  HENT: Negative for congestion and rhinorrhea.   Eyes: Negative for redness and visual disturbance.  Respiratory: Positive for cough and shortness of breath. Negative for wheezing.   Cardiovascular: Positive for chest pain. Negative for palpitations.  Gastrointestinal: Negative for nausea and vomiting.  Genitourinary: Negative for dysuria and urgency.  Musculoskeletal: Negative for arthralgias and myalgias.  Skin: Negative for pallor and wound.  Neurological: Negative for dizziness and headaches.    Physical Exam Updated Vital Signs BP 116/74   Pulse (!) 103   Temp 98.6 F (37 C)   Resp 20   SpO2 97%   Physical Exam Vitals and nursing note reviewed.  Constitutional:      General: She is not in acute distress.    Appearance: She is well-developed. She is not diaphoretic.     Comments: Hot to touch  HENT:     Head: Normocephalic and atraumatic.  Eyes:     Pupils: Pupils are equal, round, and reactive to light.  Cardiovascular:     Rate and Rhythm: Regular rhythm. Tachycardia present.     Heart sounds: No murmur heard. No friction rub. No gallop.    Pulmonary:     Effort: Pulmonary effort is normal.     Breath sounds: No wheezing or rales.  Abdominal:     General: There is no distension.     Palpations: Abdomen is soft.     Tenderness: There is no abdominal tenderness.  Musculoskeletal:        General: No tenderness.     Cervical back: Normal range of motion and neck supple.  Skin:    General: Skin is warm and dry.  Neurological:     Mental Status: She is alert and oriented to person, place, and time.  Psychiatric:        Behavior: Behavior normal.     ED Results / Procedures / Treatments   Labs (all labs ordered are listed, but only abnormal  results are displayed) Labs Reviewed  CBC WITH DIFFERENTIAL/PLATELET - Abnormal; Notable for the following components:      Result Value   WBC 20.1 (*)    Platelets 135 (*)    Neutro Abs 17.2 (*)    Monocytes Absolute 1.7 (*)    Abs Immature Granulocytes 0.31 (*)    All other components within normal limits  MAGNESIUM - Abnormal; Notable for the following components:   Magnesium 1.5 (*)    All other components within normal limits  COMPREHENSIVE METABOLIC PANEL - Abnormal; Notable for the following components:   Glucose, Bld 175 (*)    BUN 24 (*)    Creatinine, Ser 1.32 (*)    Calcium 8.0 (*)    Total Protein 5.4 (*)    Albumin 2.5 (*)    GFR, Estimated 40 (*)    All other components within normal limits  TROPONIN I (HIGH SENSITIVITY) - Abnormal; Notable for the following components:   Troponin I (High Sensitivity) 282 (*)    All other components within normal limits  RESP PANEL BY RT-PCR (FLU A&B, COVID) ARPGX2  CULTURE, BLOOD (SINGLE)  URINE CULTURE  LACTIC ACID, PLASMA  PROTIME-INR  APTT  LACTIC ACID, PLASMA  URINALYSIS, ROUTINE W REFLEX MICROSCOPIC  TROPONIN I (HIGH SENSITIVITY)    EKG EKG Interpretation  Date/Time:  Saturday January 10 2021 05:14:51 EDT Ventricular Rate:  110 PR Interval:  147 QRS Duration: 141 QT Interval:  384 QTC Calculation: 522 R  Axis:   25 Text Interpretation: Sinus tachycardia Atrial premature complex IVCD, consider atypical RBBB Otherwise no significant change Confirmed by Deno Etienne [phone]) on 01/10/2021 6:02:27 AM   Radiology DG Chest Port 1 View  Result Date: 01/10/2021 CLINICAL DATA:  Questionable sepsis EXAM: PORTABLE CHEST 1 VIEW COMPARISON:  Chest x-ray 12/20/2020 FINDINGS: The heart size and mediastinal contours are within normal limits. Redemonstration of elevation of left hemidiaphragm. Interval development of patchy airspace opacity at the left base with question of a trace left pleural effusion. Atelectasis at the right base. No pulmonary edema. No pleural effusion. No pneumothorax. Age-indeterminate left posterior fourth, fifth, seventh rib fractures. Degenerative changes of bilateral shoulders. IMPRESSION: 1. Interval development of patchy airspace opacity at the left base with question of a trace left pleural effusion. Persistent elevation of left hemidiaphragm. 2. Several age-indeterminate displaced left rib fractures including at least the posterior fourth, fifth, seventh ribs. Electronically Signed   By: Iven Finn M.D.   On: 01/10/2021 04:35    Procedures Procedures   Medications Ordered in ED Medications  azithromycin (ZITHROMAX) 500 mg in sodium chloride 0.9 % 250 mL IVPB (500 mg Intravenous New Bag/Given 01/10/21 0604)  cefTRIAXone (ROCEPHIN) 2 g in sodium chloride 0.9 % 100 mL IVPB (has no administration in time range)  azithromycin (ZITHROMAX) 500 mg in sodium chloride 0.9 % 250 mL IVPB (has no administration in time range)  sodium chloride 0.9 % bolus 1,000 mL (1,000 mLs Intravenous New Bag/Given 01/10/21 0422)  acetaminophen (TYLENOL) tablet 1,000 mg (1,000 mg Oral Given 01/10/21 0425)  cefTRIAXone (ROCEPHIN) 2 g in sodium chloride 0.9 % 100 mL IVPB (0 g Intravenous Stopped 01/10/21 0558)    ED Course  I have reviewed the triage vital signs and the nursing notes.  Pertinent labs & imaging  results that were available during my care of the patient were reviewed by me and considered in my medical decision making (see chart for details).    MDM Rules/Calculators/A&P  85 yo F with a chief complaints of feeling unwell.  Going on for couple days.  Having subjective fevers and chills at home.  Hypoxic here and not normally on oxygen.  Placed on 2 L with improvement.  Hypoxic post rib fracture obtain a chest x-ray to evaluate for pneumonia.  Blood work.  Reassess.  CXR concerning for LLL pna.  Start on abx. AKI, + trop.  CRITICAL CARE Performed by: Cecilio Asper   Total critical care time: 35 minutes  Critical care time was exclusive of separately billable procedures and treating other patients.  Critical care was necessary to treat or prevent imminent or life-threatening deterioration.  Critical care was time spent personally by me on the following activities: development of treatment plan with patient and/or surrogate as well as nursing, discussions with consultants, evaluation of patient's response to treatment, examination of patient, obtaining history from patient or surrogate, ordering and performing treatments and interventions, ordering and review of laboratory studies, ordering and review of radiographic studies, pulse oximetry and re-evaluation of patient's condition.  The patients results and plan were reviewed and discussed.   Any x-rays performed were independently reviewed by myself.   Differential diagnosis were considered with the presenting HPI.  Medications  azithromycin (ZITHROMAX) 500 mg in sodium chloride 0.9 % 250 mL IVPB (500 mg Intravenous New Bag/Given 01/10/21 0604)  cefTRIAXone (ROCEPHIN) 2 g in sodium chloride 0.9 % 100 mL IVPB (has no administration in time range)  azithromycin (ZITHROMAX) 500 mg in sodium chloride 0.9 % 250 mL IVPB (has no administration in time range)  sodium chloride 0.9 % bolus 1,000 mL (1,000 mLs  Intravenous New Bag/Given 01/10/21 0422)  acetaminophen (TYLENOL) tablet 1,000 mg (1,000 mg Oral Given 01/10/21 0425)  cefTRIAXone (ROCEPHIN) 2 g in sodium chloride 0.9 % 100 mL IVPB (0 g Intravenous Stopped 01/10/21 0558)    Vitals:   01/10/21 0300 01/10/21 0330 01/10/21 0400 01/10/21 0530  BP: 113/73 108/73 128/70 116/74  Pulse: (!) 137 (!) 126 (!) 113 (!) 103  Resp: (!) 22 13 16 20   Temp:      SpO2: 96% 96% 97% 97%    Final diagnoses:  Community acquired pneumonia of left lower lobe of lung    Admission/ observation were discussed with the admitting physician, patient and/or family and they are comfortable with the plan.   Final Clinical Impression(s) / ED Diagnoses Final diagnoses:  Community acquired pneumonia of left lower lobe of lung    Rx / DC Orders ED Discharge Orders    None       Deno Etienne, DO 01/10/21 0701

## 2021-01-10 NOTE — ED Triage Notes (Signed)
Pt brought to ED by [facility] with c/o CP and SOB since this since approximately 2100 01/09/2021. Per EMS, pt 12 lead indicated RBBB and AFib with RVR and rate of 140s. Pt followed by cardiology but unsure of hx of same. Upon arrival, pt complaining of pain to center of chest near sternum. Denies any home supplemental O2.

## 2021-01-10 NOTE — H&P (Signed)
History and Physical    Deanna Salinas NTI:[id number] DOB: 06-01-1936 DOA: 01/10/2021  PCP: Kathyrn Drown, MD Consultants:  Harl Bowie - cardiology; Rourk - GI Patient coming from:  Home - lives with husband of 65y; NOK: Deanna Salinas, [phone]  Chief Complaint: CP, SOB  HPI: Deanna Salinas is a 85 y.o. female with medical history significant of HTN; HLD and DM presenting with SOB, CP. She started having CP and SOB last night.  She has had mild symptoms recently.  She recently fell and had a fractured rib.  She fell on May 14 and went to the ER then.  She has been feeling some nauseated, not eating much, coughing some with mild phlegm.  +fever/chills for the last 4 days.  She has chronic diffuse body pain and has had intermittent L chest pain since she fell and broke her rib.  This afternoon, I was called back to her room for recurrent CP - non-exertional, some relief with NTG.    ED Course:  Carryover, per Dr. Nevada Crane:  85 yo F past medical history hypertension, chronic anxiety/depression, recent fall, with a chief complaint of feeling unwell for a few days. Associated mild cough and chest pain that radiates into the left shoulder. Has been too fatigued to get up and move around. Recently had a fall and suffered broken ribs. Work-up in the ED revealed left lower lobe infiltrates and left pleural effusion with concern for pneumonia. Started on IV antibiotics empirically in the ED. While in the ED she was found to be hypoxic with O2 saturation in the mid 80s, not on oxygen supplementation at baseline.  Review of Systems: As per HPI; otherwise review of systems reviewed and negative.   Ambulatory Status:  Ambulates with walker  COVID Vaccine Status:  Complete  Past Medical History:  Diagnosis Date  . Essential hypertension   . Fatty liver   . Hyperlipidemia   . Type 2 diabetes mellitus ([place])     Past Surgical History:  Procedure Laterality Date  . APPENDECTOMY     At  the time of hysterectomy  . CESAREAN SECTION     X5  . COLONOSCOPY    . COLONOSCOPY N/A 06/11/2013   Colonic diverticulosis, hyperplastic polyps. no further screening colonoscopies recommended  . ESOPHAGOGASTRODUODENOSCOPY N/A 02/20/2014   Procedure: ESOPHAGOGASTRODUODENOSCOPY (EGD);  Surgeon: Daneil Dolin, MD;  Location: [facility];  Service: Endoscopy;  Laterality: N/A;  11:00    Social History   Socioeconomic History  . Marital status: Married    Spouse name: Not on file  . Number of children: 5  . Years of education: Not on file  . Highest education level: Not on file  Occupational History    Employer: RETIRED  Tobacco Use  . Smoking status: Never Smoker  . Smokeless tobacco: Never Used  Vaping Use  . Vaping Use: Never used  Substance and Sexual Activity  . Alcohol use: Yes    Alcohol/week: 3.0 standard drinks    Types: 3 Glasses of wine per week    Comment: occasional  . Drug use: No  . Sexual activity: Not on file  Other Topics Concern  . Not on file  Social History Narrative  . Not on file   Social Determinants of Health   Financial Resource Strain: Not on file  Food Insecurity: Not on file  Transportation Needs: Not on file  Physical Activity: Not on file  Stress: Not on file  Social Connections: Not on  file  Intimate Partner Violence: Not on file    Allergies  Allergen Reactions  . Ambien [Zolpidem Tartrate]     Side effects   . Lipitor [Atorvastatin]     Muscle and joint pain  . Metformin And Related     Reported side effects headaches insomnia nausea  . Pravastatin     Muscle and joint pain  . Trazodone And Nefazodone     Side effects    Family History  Problem Relation Age of Onset  . Heart Problems Father   . Hypertension Other   . Hypertension Other   . Cancer Mother   . Cancer Daughter        breast   . Colon cancer Neg Hx     Prior to Admission medications   Medication Sig Start Date End Date Taking? Authorizing Provider   albuterol (VENTOLIN HFA) 108 (90 Base) MCG/ACT inhaler Inhale 2 puffs into the lungs every 6 (six) hours as needed for wheezing or shortness of breath. 06/05/20  Yes Barton Dubois, MD  amLODipine (NORVASC) 2.5 MG tablet TAKE ONE TABLET BY MOUTH ONCE DAILY. Patient taking differently: Take 2.5 mg by mouth daily. 08/26/20  Yes Kathyrn Drown, MD  BAYER CONTOUR NEXT TEST test strip USE AS DIRECTED 07/28/16  Yes Luking, Elayne Snare, MD  guaiFENesin-dextromethorphan (ROBITUSSIN DM) 100-10 MG/5ML syrup Take 5 mLs by mouth every 4 (four) hours as needed for cough. 06/05/20  Yes Barton Dubois, MD  Multiple Vitamin (MULTI-VITAMIN DAILY PO) Take 1 tablet by mouth daily.   Yes [provider]  nortriptyline (PAMELOR) 10 MG capsule TAKE 1 TO 2 CAPSULES BY MOUTH AT BEDTIME AS DIRECTED. Patient taking differently: 10 mg at bedtime. 09/08/20  Yes Luking, Elayne Snare, MD  quinapril (ACCUPRIL) 20 MG tablet TAKE (1) TABLET BY MOUTH ONCE DAILY. Patient taking differently: Take 20 mg by mouth daily. 12/10/20  Yes Luking, Scott A, MD  temazepam (RESTORIL) 30 MG capsule TAKE 1 CAPSULE BY MOUTH AT BEDTIME AS NEEDED FOR SLEEP. Patient taking differently: Take 30 mg by mouth at bedtime. 12/11/20  Yes Kathyrn Drown, MD  traMADol (ULTRAM) 50 MG tablet Take 25-50 mg by mouth 2 (two) times daily as needed. 01/06/21  Yes [provider]  torsemide (DEMADEX) 20 MG tablet Take 1 tablet (20 mg total) by mouth daily. Patient not taking: No sig reported 03/28/20 06/26/20  Erma Heritage, PA-C    Physical Exam: Vitals:   01/10/21 1020 01/10/21 1030 01/10/21 1127 01/10/21 1513  BP: 95/84  107/88 (!) 122/98  Pulse: 99  100 (!) 59  Resp: 17  16 16   Temp:  98.2 F (36.8 C) 97.9 F (36.6 C) (!) 97.5 F (36.4 C)  TempSrc:   Oral Oral  SpO2: 96%  97% 91%     . General:  Appears calm and comfortable and is in NAD, very conversant . Eyes:  PERRL, EOMI, normal lids, iris . ENT:  Hard of hearing, grossly normal  lips & tongue, mmm . Neck:  no LAD, masses or thyromegaly . Cardiovascular:  RR with mild tachycardia, no m/r/g. No LE edema.  Marland Kitchen Respiratory:   CTA bilaterally with no wheezes/rales/rhonchi.  Normal respiratory effort.  L chest wall TTP. Marland Kitchen Abdomen:  soft, NT, ND . Skin:  no rash or induration seen on limited exam . Musculoskeletal:  grossly normal tone BUE/BLE, good ROM, no bony abnormality . Psychiatric:  grossly normal mood and affect, speech fluent and appropriate, AOx3 . Neurologic:  CN 2-12 grossly intact, moves all extremities in coordinated fashion    Radiological Exams on Admission: Independently reviewed - see discussion in A/P where applicable  DG Chest Port 1 View  Result Date: 01/10/2021 CLINICAL DATA:  Questionable sepsis EXAM: PORTABLE CHEST 1 VIEW COMPARISON:  Chest x-ray 12/20/2020 FINDINGS: The heart size and mediastinal contours are within normal limits. Redemonstration of elevation of left hemidiaphragm. Interval development of patchy airspace opacity at the left base with question of a trace left pleural effusion. Atelectasis at the right base. No pulmonary edema. No pleural effusion. No pneumothorax. Age-indeterminate left posterior fourth, fifth, seventh rib fractures. Degenerative changes of bilateral shoulders. IMPRESSION: 1. Interval development of patchy airspace opacity at the left base with question of a trace left pleural effusion. Persistent elevation of left hemidiaphragm. 2. Several age-indeterminate displaced left rib fractures including at least the posterior fourth, fifth, seventh ribs. Electronically Signed   By: Iven Finn M.D.   On: 01/10/2021 04:35    EKG: Independently reviewed.   5681 - Afib with rate 129; IVCD, RBBB 0514 - Sinus tachycardia with rate 110; IVCD, RBBB   Labs on Admission: I have personally reviewed the available labs and imaging studies at the time of the admission.  Pertinent labs:   Glucose 175 BUN 24/Creatinine 1.32/GFR  40 Mag++ 1.5 HS troponin 282, 203, 100 Lactate 1.5, 1.5 WBC 20.1 Platelets 135 INR 1.2 COVID/flu negative   Assessment/Plan Principal Problem:   Sepsis due to pneumonia [facility]) Active Problems:   Hyperlipidemia   Essential hypertension   Chronic insomnia   Type 2 diabetes mellitus (HCC)   Chest pain of uncertain etiology   Rib fractures   Sepsis due to PNA   -SIRS criteria in this patient includes: Leukocytosis, tachycardia, tachypnea  -Patient has evidence of acute organ failure with recurrent hypotension (SBP < 90 or MAP < 65 x 2 readings) that is not easily explained by another condition. -While awaiting blood cultures, this appears to be a preseptic condition. -Sepsis protocol initiated -Suspected source is PNA -Patient presenting with cough, mildly decreased oxygen saturation (91%), and infiltrate in left lower lobe on chest x-ray -This appears to be most likely community-acquired pneumonia - likely associated with splinting due to rib fractures.  -Other etiologies include aspiration (if altered mental status was more severe than described) versus URI (most likely viral). -Influenza negative. -COVID-19 negative. -Will order lower respiratory tract procalcitonin level.   -CURB-65 score is 3 -Pneumonia Severity Index (PSI) is Class 4, 9% mortality. -Will start Azithromycin 500 mg IV daily and Rocephin due to no risk factors for MDR cause  -NS @ 75cc/hr -Fever control -Repeat CBC in am -Will add albuterol PRN -Will add Mucinex for cough -Blood and urine cultures pending -Will admit due to: hemodynamic instability; hypoxemiia  Chest pain with elevated troponin -Patient with LLL PNA and L-sided rib fractures also c/o L-sided CP -Appears to be MSK-related -However, troponin was elevated on presentation and is downtrending -Continues to periodically complain of CP -Repeat CXR ordered -Cardiology consult requested  L Rib Fractures -CXR today with rib fractures  "including at least the posterior fourth, fifth, seventh ribs" -This is likely the source of her left-sided CP (in conjunction with new infiltrate in LLL) -Lidocaine patch provided -Pain control as need with Norco/morphine  HTN -Continue Norvasc, Accupril  HLD -she does not appear to be taking medications for this issue at this time  DM -Will check A1c, prior was 6.8 -She does not appear to be  taking medications for this issue at this time -Cover with moderate-scale SSI  Insomnia -Patient reports severe insomnia, sleeping only 2 hours per night -Continue nortriptyline, temazepam    Note: This patient has been tested and is negative for the novel coronavirus COVID-19. She has been fully vaccinated against COVID-19.    DVT prophylaxis:  Lovenox Code Status:  DNR - confirmed with patient Family Communication: None present Disposition Plan:  The patient is from: home  Anticipated d/c is to: home without [facility] services  Anticipated d/c date will depend on clinical response to treatment, likely 2-3 days  Patient is currently: acutely ill Consults called: PT/OT/RT Admission status: Admit - It is my clinical opinion that admission to [facility] is reasonable and necessary because of the expectation that this patient will require hospital care that crosses at least 2 midnights to treat this condition based on the medical complexity of the problems presented.  Given the aforementioned information, the predictability of an adverse outcome is felt to be significant.   Karmen Bongo MD [facility]   How to contact the [facility] Attending or Consulting provider [place] or covering provider during after hours [place], for this patient?  1. Check the care team in [facility] and look for a) attending/consulting TR[facility] provider listed and b) the [facility] team listed 2. Log into [email or web address] and use 's universal password to access. If you do not have the password, please contact the hospital  operator. 3. Locate the [facility] provider you are looking for under [facility] and page to a number that you can be directly reached. 4. If you still have difficulty reaching the provider, please page the [facility] (Director on Call) for the Hospitalists listed on amion for assistance.   01/10/2021, 6:04 PM

## 2021-01-11 ENCOUNTER — Other Ambulatory Visit: Payer: Self-pay

## 2021-01-11 LAB — CBC
HCT: 38.3 % (ref 36.0–46.0)
Hemoglobin: 11.9 g/dL — ABNORMAL LOW (ref 12.0–15.0)
MCH: 31.6 pg (ref 26.0–34.0)
MCHC: 31.1 g/dL (ref 30.0–36.0)
MCV: 101.6 fL — ABNORMAL HIGH (ref 80.0–100.0)
Platelets: 115 10*3/uL — ABNORMAL LOW (ref 150–400)
RBC: 3.77 MIL/uL — ABNORMAL LOW (ref 3.87–5.11)
RDW: 14.5 % (ref 11.5–15.5)
WBC: 15 10*3/uL — ABNORMAL HIGH (ref 4.0–10.5)
nRBC: 0 % (ref 0.0–0.2)

## 2021-01-11 LAB — GLUCOSE, CAPILLARY
Glucose-Capillary: 123 mg/dL — ABNORMAL HIGH (ref 70–99)
Glucose-Capillary: 152 mg/dL — ABNORMAL HIGH (ref 70–99)
Glucose-Capillary: 167 mg/dL — ABNORMAL HIGH (ref 70–99)

## 2021-01-11 LAB — BASIC METABOLIC PANEL
Anion gap: 8 (ref 5–15)
BUN: 30 mg/dL — ABNORMAL HIGH (ref 8–23)
CO2: 26 mmol/L (ref 22–32)
Calcium: 8 mg/dL — ABNORMAL LOW (ref 8.9–10.3)
Chloride: 101 mmol/L (ref 98–111)
Creatinine, Ser: 1.44 mg/dL — ABNORMAL HIGH (ref 0.44–1.00)
GFR, Estimated: 36 mL/min — ABNORMAL LOW (ref >60)
Glucose, Bld: 150 mg/dL — ABNORMAL HIGH (ref 70–99)
Potassium: 4.5 mmol/L (ref 3.5–5.1)
Sodium: 135 mmol/L (ref 135–145)

## 2021-01-11 NOTE — Evaluation (Signed)
Occupational Therapy Evaluation Patient Details Name: Deanna Salinas MRN: [id number] DOB: [birth year] Today's Date: 01/11/2021    History of Present Illness 85 year old female who presented to ED due to chest pain, fever and shortness of breath. In the ED she was found to be hypoxic with sats in the mid 80s as well as left-sided lobar pneumonia with a trace left-sided pleural effusion. Pt diagnosed with sepsis. Pt also with recent fall and L rib fractures. PMH: HTN, HLD, DM2   Clinical Impression   PTA, pt lives with husband and ambulatory with Rollator. Unsure of PLOF accuracy as pt confused with tangential conversation throughout. Pt overall Mod A for bed mobility, declined standing attempts secondary to a fear of falling. Pt requires Min A for UB ADLs and up to Total A for LB ADLs due to deficits noted below (see OT problem list). HR <135bpm with these activities, SpO2 95% on 4 L O2 though wheezing sounds noted. Recommend SNF at DC. Plan to progress OOB ADLs as appropriate.     Follow Up Recommendations  SNF;Supervision/Assistance - 24 hour    Equipment Recommendations  None recommended by OT    Recommendations for Other Services       Precautions / Restrictions Precautions Precautions: Fall;Other (comment) Precaution Comments: watch HR, O2 Restrictions Weight Bearing Restrictions: No      Mobility Bed Mobility Overal bed mobility: Needs Assistance Bed Mobility: Supine to Sit;Sit to Supine     Supine to sit: Min assist;HOB elevated Sit to supine: Mod assist   General bed mobility comments: Min A to advance trunk to EOB, Mod A to get B LE back into bed    Transfers                 General transfer comment: pt declined    Balance Overall balance assessment: Needs assistance Sitting-balance support: No upper extremity supported;Feet supported Sitting balance-Leahy Scale: Fair Sitting balance - Comments: able to sit EOB without support, noted limitations with  dynamic tasks                                   ADL either performed or assessed with clinical judgement   ADL Overall ADL's : Needs assistance/impaired Eating/Feeding: Set up;Sitting   Grooming: Minimal assistance;Sitting   Upper Body Bathing: Minimal assistance;Sitting   Lower Body Bathing: Maximal assistance;Sitting/lateral leans   Upper Body Dressing : Minimal assistance;Sitting   Lower Body Dressing: Maximal assistance;Sitting/lateral leans;Sit to/from stand Lower Body Dressing Details (indicate cue type and reason): able to pull up L sock sitting EOB but difficulty reaching R sock     Toileting- Clothing Manipulation and Hygiene: Total assistance;Bed level         General ADL Comments: Pt confusion limiting participation, noted wheezing and fatigue with minimal activities     Vision Patient Visual Report: No change from baseline Vision Assessment?: No apparent visual deficits     Perception     Praxis      Pertinent Vitals/Pain Pain Assessment: No/denies pain     Hand Dominance Right   Extremity/Trunk Assessment Upper Extremity Assessment Upper Extremity Assessment: Overall WFL for tasks assessed   Lower Extremity Assessment Lower Extremity Assessment: Defer to PT evaluation   Cervical / Trunk Assessment Cervical / Trunk Assessment: Normal   Communication Communication Communication: HOH   Cognition Arousal/Alertness: Awake/alert Behavior During Therapy: Flat affect;Restless;Impulsive Overall Cognitive Status: No family/caregiver present to determine baseline  cognitive functioning Area of Impairment: Orientation;Attention;Memory;Following commands;Safety/judgement;Awareness;Problem solving                 Orientation Level: Disoriented to;Place;Time;Situation Current Attention Level: Sustained Memory: Decreased short-term memory Following Commands: Follows one step commands with increased time Safety/Judgement: Decreased  awareness of safety;Decreased awareness of deficits Awareness: Intellectual Problem Solving: Slow processing;Difficulty sequencing;Requires verbal cues;Requires tactile cues General Comments: Pt A&OX1, able to report Sunday and guess the 5th or 6th but then reports she had a fall on June 14th (of this year even when OT asked for clarifcation). Reports she is at [facility]. Tangential conversation throughout, difficulty forming full coherent conversation. Pt asked repeatedly "Why am I here?"   General Comments  Pt SpO2 95% on 4 L O2, HR up to 135 with activity, 128 at rest    Exercises     Shoulder Instructions      [place] expects to be discharged to:: Private residence Living Arrangements: Spouse/significant other Available Help at Discharge: Family Type of Home: House Home Access: [place]: One level     Bathroom Shower/Tub: Occupational psychologist: Handicapped height Bathroom Accessibility: Yes   Home Equipment: Grab bars - tub/shower;Walker - 4 wheels   Additional Comments: Home info obtained from previous admission (10/21) as pt poor historian      Prior Functioning/Environment Level of Independence: Needs assistance  Gait / Transfers Assistance Needed: per pt, uses Rollator for mobility ADL's / Homemaking Assistance Needed: Reports able to complete ADLs   Comments: unsure of PLOF due to pt poor historian        OT Problem List: Decreased strength;Impaired balance (sitting and/or standing);Decreased activity tolerance;Decreased safety awareness;Decreased cognition;Decreased knowledge of use of DME or AE;Cardiopulmonary status limiting activity      OT Treatment/Interventions: Self-care/ADL training;Therapeutic exercise;Energy conservation;DME and/or AE instruction;Therapeutic activities;Patient/family education;Balance training    OT Goals(Current goals can be found in the care plan section) Acute Rehab OT Goals Patient  Stated Goal: feel better OT Goal Formulation: With patient Time For Goal Achievement: 01/25/21 Potential to Achieve Goals: Good ADL Goals Pt Will Perform Grooming: with set-up;sitting Pt Will Perform Upper Body Dressing: sitting;with modified independence Pt Will Perform Lower Body Dressing: with min assist;sit to/from stand Pt Will Transfer to Toilet: with min assist;stand pivot transfer;bedside commode Additional ADL Goal #1: Pt to attend to ADL tasks > 5 min with min verbal cues  OT Frequency: Min 2X/week   Barriers to D/C:            Co-evaluation              AM-PAC OT "6 Clicks" Daily Activity     Outcome Measure Help from another person eating meals?: A Little Help from another person taking care of personal grooming?: A Little Help from another person toileting, which includes using toliet, bedpan, or urinal?: Total Help from another person bathing (including washing, rinsing, drying)?: A Lot Help from another person to put on and taking off regular upper body clothing?: A Little Help from another person to put on and taking off regular lower body clothing?: A Lot 6 Click Score: 14   End of Session Equipment Utilized During Treatment: Oxygen Nurse Communication: Mobility status  Activity Tolerance: Other (comment) (limited by cognition) Patient left: in bed;with call bell/phone within reach;with bed alarm set  OT Visit Diagnosis: Other abnormalities of gait and mobility (R26.89);Unsteadiness on feet (R26.81);Muscle weakness (generalized) (M62.81);History of falling (Z91.81);Other symptoms  and signs involving cognitive function                Time: 1004-1020 OT Time Calculation (min): 16 min Charges:  OT General Charges $OT Visit: 1 Visit OT Evaluation $OT Eval Moderate Complexity: 1 Mod  Malachy Chamber, OTR/L Acute Rehab Services Office: [phone]  Layla Maw 01/11/2021, 10:47 AM

## 2021-01-11 NOTE — Progress Notes (Signed)
Pt intermittently awakened and confused with increased HR in the 140's not sure if it is morphine related.MD on call notified. No new orders at this time. Denies chest pain or SOB. Restoril given to help her sleep as ordered. Will keep monitoring

## 2021-01-11 NOTE — Progress Notes (Signed)
Patient has increased confusions and restelessness.  Pulling lines and gown. Redirected and reoriented. Safety mittens applied on both hands. Made comfortable in bed and will continue to monitor.

## 2021-01-11 NOTE — Progress Notes (Signed)
Patient woke up at this time slightly confused. Was mostly disoriented to place and time. RN reoriented her and made her comfortable in bed.

## 2021-01-11 NOTE — Progress Notes (Signed)
PROGRESS NOTE  Deanna Salinas OZD:[id number] DOB: 10/10/1935 DOA: 01/10/2021 PCP: Kathyrn Drown, MD   LOS: 1 day   Brief Narrative / Interim history: 85 year old female with history of HTN, HLD, DM2 comes to the hospital with chest pain and shortness of breath.  She apparently recently had a fall and had a fractured rib.  She has been having fever and chills for the past 4 days.  In the ED she was found to be hypoxic with sats in the mid 80s as well as left-sided lobar pneumonia with a trace left-sided pleural effusion.  She was placed on antibiotics and admitted to the hospital  Subjective / 24h Interval events: Very confused this morning, has mittens on.  Alert but cannot carry a coherent conversation and is very tangential  Assessment & Plan: Principal Problem Sepsis due to lobar pneumonia, E. coli bacteremia -blood cultures overnight speciated E. coli, her ceftriaxone was increased to 2 g -She has some wheezing this morning, hold further fluids for now.  She is afebrile, WBC improving  Active Problems Acute hypoxic respiratory failure due to pneumonia-currently on 3 L, wean off to room air as tolerated.  Acute metabolic encephalopathy-likely in the setting of sepsis, she is does not appear to have been confused during her presentation in the ED  Chest pain with elevated troponin-Patient with LLL PNA and L-sided rib fractures also c/o L-sided CP, appears to be musculoskeletal.  Troponins overall flat, likely demand.  Acute kidney injury-likely in the setting of #1, hold Accupril.  Due to wheezing would favor to stop fluids today and encourage p.o. intake  L Rib Fractures -CXR on admission with rib fractures "including at least the posterior fourth, fifth, seventh ribs", This is likely the source of her left-sided CP (in conjunction with new infiltrate in LLL) -Lidocaine patch provided -Minimize narcotics due to confusion  HTN -Continue Norvasc, hold Accupril due to acute kidney  injury  HLD -she does not appear to be taking medications for this issue at this time  DM -Continue sliding scale  Insomnia -Patient reports severe insomnia, sleeping only 2 hours per night -Continue nortriptyline, temazepam   Scheduled Meds: . docusate sodium  100 mg Oral BID  . enoxaparin (LOVENOX) injection  40 mg Subcutaneous Q24H  . insulin aspart  0-15 Units Subcutaneous TID WC  . insulin aspart  0-5 Units Subcutaneous QHS  . lidocaine  1 patch Transdermal Q24H  . nortriptyline  10 mg Oral QHS  . temazepam  30 mg Oral QHS   Continuous Infusions: . cefTRIAXone (ROCEPHIN)  IV 2 g (01/11/21 0819)   PRN Meds:.acetaminophen **OR** acetaminophen, albuterol, bisacodyl, guaiFENesin-dextromethorphan, hydrALAZINE, HYDROcodone-acetaminophen, morphine injection, nitroGLYCERIN, ondansetron **OR** ondansetron (ZOFRAN) IV, phenol, polyethylene glycol, temazepam, traMADol  Diet Orders (From admission, onward)    Start     Ordered   01/10/21 0916  Diet Carb Modified Fluid consistency: Thin; Room service appropriate? Yes  Diet effective now       Question Answer Comment  Diet-HS Snack? Nothing   Calorie Level Medium 1600-2000   Fluid consistency: Thin   Room service appropriate? Yes      01/10/21 0915          DVT prophylaxis: enoxaparin (LOVENOX) injection 40 mg Start: 01/10/21 1000     Code Status: DNR  Family Communication: No family at bedside, discussed with husband over the phone  Status is: Inpatient  Remains inpatient appropriate because:Inpatient level of care appropriate due to severity of illness   Dispo:  The patient is from: Home              Anticipated d/c is to: Home              Patient currently is not medically stable to d/c.   Difficult to place patient No  Level of care: Telemetry Medical  Consultants:  None  Procedures:  none  Microbiology  Blood cultures-E. coli  Antimicrobials: Ceftriaxone   Objective: Vitals:   01/10/21 2027  01/11/21 0027 01/11/21 0400 01/11/21 0820  BP: 129/77 (!) 144/94 117/68 128/72  Pulse: (!) 124 (!) 125 (!) 123 (!) 124  Resp: 17 18 18 18   Temp: 98.1 F (36.7 C) 98.5 F (36.9 C) 98.7 F (37.1 C) 98.8 F (37.1 C)  TempSrc: Oral Oral Oral Oral  SpO2: 98% 95% 97% 100%    Intake/Output Summary (Last 24 hours) at 01/11/2021 0948 Last data filed at 01/11/2021 0300 Gross per 24 hour  Intake 999.75 ml  Output --  Net 999.75 ml   There were no vitals filed for this visit.  Examination:  Constitutional: Alert, confused Eyes: no scleral icterus ENMT: Mucous membranes are moist.  Neck: normal, supple Respiratory: Faint end expiratory wheezing, no crackles Cardiovascular: Regular rate and rhythm, no murmurs / rubs / gallops. No LE edema.  Tachycardic Abdomen: non distended, no tenderness. Bowel sounds positive.  Musculoskeletal: no clubbing / cyanosis.  Skin: no rashes Neurologic: Nonfocal  Data Reviewed: I have independently reviewed following labs and imaging studies   CBC: Recent Labs  Lab 01/10/21 0417 01/11/21 0313  WBC 20.1* 15.0*  NEUTROABS 17.2*  --   HGB 13.2 11.9*  HCT 40.9 38.3  MCV 97.8 101.6*  PLT 135* 989*   Basic Metabolic Panel: Recent Labs  Lab 01/10/21 0417 01/11/21 0313  NA 136 135  K 3.7 4.5  CL 101 101  CO2 24 26  GLUCOSE 175* 150*  BUN 24* 30*  CREATININE 1.32* 1.44*  CALCIUM 8.0* 8.0*  MG 1.5*  --    Liver Function Tests: Recent Labs  Lab 01/10/21 0417  AST 24  ALT 17  ALKPHOS 113  BILITOT 0.8  PROT 5.4*  ALBUMIN 2.5*   Coagulation Profile: Recent Labs  Lab 01/10/21 0417  INR 1.2   HbA1C: No results for input(s): HGBA1C in the last 72 hours. CBG: Recent Labs  Lab 01/10/21 1135 01/10/21 1542 01/10/21 2009 01/10/21 2159 01/11/21 0611  GLUCAP 140* 145* 146* 140* 123*    Recent Results (from the past 240 hour(s))  Blood culture (routine single)     Status: Abnormal (Preliminary result)   Collection Time: 01/10/21  4:07  AM   Specimen: BLOOD  Result Value Ref Range Status   Specimen Description BLOOD RIGHT ANTECUBITAL  Final   Special Requests   Final    BOTTLES DRAWN AEROBIC AND ANAEROBIC Blood Culture adequate volume   Culture  Setup Time   Final    GRAM NEGATIVE RODS IN BOTH AEROBIC AND ANAEROBIC BOTTLES CRITICAL RESULT CALLED TO, READ BACK BY AND VERIFIED WITH: PHARM D A.MAYER ON [id number] AT 1957 BY E.PARRISH    Culture (A)  Final    ESCHERICHIA COLI SUSCEPTIBILITIES TO FOLLOW Performed at [facility], [place] [address] [place], [place] [address]    Report Status PENDING  Incomplete  Blood Culture ID Panel (Reflexed)     Status: Abnormal   Collection Time: 01/10/21  4:07 AM  Result Value Ref Range Status   Enterococcus faecalis NOT  DETECTED NOT DETECTED Final   Enterococcus Faecium NOT DETECTED NOT DETECTED Final   Listeria monocytogenes NOT DETECTED NOT DETECTED Final   Staphylococcus species NOT DETECTED NOT DETECTED Final   Staphylococcus aureus (BCID) NOT DETECTED NOT DETECTED Final   Staphylococcus epidermidis NOT DETECTED NOT DETECTED Final   Staphylococcus lugdunensis NOT DETECTED NOT DETECTED Final   Streptococcus species NOT DETECTED NOT DETECTED Final   Streptococcus agalactiae NOT DETECTED NOT DETECTED Final   Streptococcus pneumoniae NOT DETECTED NOT DETECTED Final   Streptococcus pyogenes NOT DETECTED NOT DETECTED Final   A.calcoaceticus-baumannii NOT DETECTED NOT DETECTED Final   Bacteroides fragilis NOT DETECTED NOT DETECTED Final   Enterobacterales DETECTED (A) NOT DETECTED Final    Comment: Enterobacterales represent a large order of gram negative bacteria, not a single organism. CRITICAL RESULT CALLED TO, READ BACK BY AND VERIFIED WITH: PHARM D A.MAYER ON [id number] AT 1957 BY E.PARRISH    Enterobacter cloacae complex NOT DETECTED NOT DETECTED Final   Escherichia coli DETECTED (A) NOT DETECTED Final    Comment: CRITICAL RESULT CALLED TO, READ BACK BY AND VERIFIED  WITH: PHARM D A.MAYER ON [id number] AT 1957 BY E.PARRISH    Klebsiella aerogenes NOT DETECTED NOT DETECTED Final   Klebsiella oxytoca NOT DETECTED NOT DETECTED Final   Klebsiella pneumoniae NOT DETECTED NOT DETECTED Final   Proteus species NOT DETECTED NOT DETECTED Final   Salmonella species NOT DETECTED NOT DETECTED Final   Serratia marcescens NOT DETECTED NOT DETECTED Final   Haemophilus influenzae NOT DETECTED NOT DETECTED Final   Neisseria meningitidis NOT DETECTED NOT DETECTED Final   Pseudomonas aeruginosa NOT DETECTED NOT DETECTED Final   Stenotrophomonas maltophilia NOT DETECTED NOT DETECTED Final   Candida albicans NOT DETECTED NOT DETECTED Final   Candida auris NOT DETECTED NOT DETECTED Final   Candida glabrata NOT DETECTED NOT DETECTED Final   Candida krusei NOT DETECTED NOT DETECTED Final   Candida parapsilosis NOT DETECTED NOT DETECTED Final   Candida tropicalis NOT DETECTED NOT DETECTED Final   Cryptococcus neoformans/gattii NOT DETECTED NOT DETECTED Final   CTX-M ESBL NOT DETECTED NOT DETECTED Final   Carbapenem resistance IMP NOT DETECTED NOT DETECTED Final   Carbapenem resistance KPC NOT DETECTED NOT DETECTED Final   Carbapenem resistance NDM NOT DETECTED NOT DETECTED Final   Carbapenem resist OXA 48 LIKE NOT DETECTED NOT DETECTED Final   Carbapenem resistance VIM NOT DETECTED NOT DETECTED Final    Comment: Performed at [facility], [address], [place], [place] [address]  Resp Panel by RT-PCR (Flu A&B, Covid) Nasopharyngeal Swab     Status: None   Collection Time: 01/10/21  4:15 AM   Specimen: Nasopharyngeal Swab; Nasopharyngeal(NP) swabs in vial transport medium  Result Value Ref Range Status   SARS Coronavirus 2 by RT PCR NEGATIVE NEGATIVE Final    Comment: (NOTE) SARS-CoV-2 target nucleic acids are NOT DETECTED.  The SARS-CoV-2 RNA is generally detectable in upper respiratory specimens during the acute phase of infection. The lowest concentration of  SARS-CoV-2 viral copies this assay can detect is 138 copies/mL. A negative result does not preclude SARS-Cov-2 infection and should not be used as the sole basis for treatment or other patient management decisions. A negative result may occur with  improper specimen collection/handling, submission of specimen other than nasopharyngeal swab, presence of viral mutation(s) within the areas targeted by this assay, and inadequate number of viral copies(<138 copies/mL). A negative result must be combined with clinical observations, patient history, and epidemiological  information. The expected result is Negative.  Fact Sheet for Patients:  EntrepreneurPulse.com.au  Fact Sheet for Healthcare Providers:  IncredibleEmployment.be  This test is no t yet approved or cleared by the [place] FDA and  has been authorized for detection and/or diagnosis of SARS-CoV-2 by FDA under an Emergency Use Authorization (EUA). This EUA will remain  in effect (meaning this test can be used) for the duration of the COVID-19 declaration under Section 564(b)(1) of the Act, 21 U.S.C.section 360bbb-3(b)(1), unless the authorization is terminated  or revoked sooner.       Influenza A by PCR NEGATIVE NEGATIVE Final   Influenza B by PCR NEGATIVE NEGATIVE Final    Comment: (NOTE) The Xpert Xpress SARS-CoV-2/FLU/RSV plus assay is intended as an aid in the diagnosis of influenza from Nasopharyngeal swab specimens and should not be used as a sole basis for treatment. Nasal washings and aspirates are unacceptable for Xpert Xpress SARS-CoV-2/FLU/RSV testing.  Fact Sheet for Patients: EntrepreneurPulse.com.au  Fact Sheet for Healthcare Providers: IncredibleEmployment.be  This test is not yet approved or cleared by the [place] FDA and has been authorized for detection and/or diagnosis of SARS-CoV-2 by FDA under an Emergency Use  Authorization (EUA). This EUA will remain in effect (meaning this test can be used) for the duration of the COVID-19 declaration under Section 564(b)(1) of the Act, 21 U.S.C. section 360bbb-3(b)(1), unless the authorization is terminated or revoked.  Performed at [facility], [place] [address], [place], [place] [address]      Radiology Studies: No results found.  Marzetta Board, MD, PhD [facility]  Between 7 am - 7 pm I am available, please contact me via Amion (for emergencies) or Securechat (non urgent messages)  Between 7 pm - 7 am I am not available, please contact night coverage MD/APP via Amion

## 2021-01-11 NOTE — Evaluation (Signed)
Physical Therapy Evaluation Patient Details Name: Deanna Salinas MRN: [id number] DOB: [birth year] Today's Date: 01/11/2021   History of Present Illness  85 year old female who presented to ED on 6/4 due to chest pain, fever and shortness of breath. In the ED she was found to be hypoxic with sats in the mid 80s as well as left-sided lobar pneumonia with a trace left-sided pleural effusion. Pt diagnosed with sepsis. Pt also with recent fall and L rib fractures. PMH: HTN, HLD, DM2  Clinical Impression  Pt admitted secondary to problem above with deficits below. Pt very confused throughout session, and kept repeating "What is going on with me?" Unsure of baseline. Was very hesitant to attempt standing as she is fearful of falling. On attempts to stand, pt helping very minimally and was unable to clear hips. Was able to scoot at EOB with supervision. Given current deficits, feel pt would benefit from SNF level therapies. Will continue to follow acutely.     Follow Up Recommendations SNF;Supervision/Assistance - 24 hour    Equipment Recommendations  Wheelchair cushion (measurements PT);Wheelchair (measurements PT)    Recommendations for Other Services       Precautions / Restrictions Precautions Precautions: Fall;Other (comment) Precaution Comments: watch HR, O2, previous L rib fx Restrictions Weight Bearing Restrictions: No      Mobility  Bed Mobility Overal bed mobility: Needs Assistance Bed Mobility: Supine to Sit;Sit to Supine     Supine to sit: Min assist;HOB elevated Sit to supine: Mod assist   General bed mobility comments: Min A to advance trunk to EOB, Mod A to get B LE back into bed. Increased time required    Transfers Overall transfer level: Needs assistance Equipment used: None Transfers: Lateral/Scoot Transfers          Lateral/Scoot Transfers: Supervision General transfer comment: Attempted to stand X2, however, pt not really helping to stand out of fear of  falling. Was unable to clear hips. Was able to laterally scoot along EOB with supervision for repositioning in bed.  Ambulation/Gait                Stairs            Wheelchair Mobility    Modified Rankin (Stroke Patients Only)       Balance Overall balance assessment: Needs assistance Sitting-balance support: No upper extremity supported;Feet supported Sitting balance-Leahy Scale: Fair Sitting balance - Comments: able to sit EOB without support, noted limitations with dynamic tasks                                     Pertinent Vitals/Pain Pain Assessment: Faces Faces Pain Scale: Hurts little more Pain Location: L chest Pain Descriptors / Indicators: Guarding;Grimacing Pain Intervention(s): Limited activity within patient's tolerance;Monitored during session;Repositioned    Home Living Family/patient expects to be discharged to:: Private residence Living Arrangements: Spouse/significant other Available Help at Discharge: Family Type of Home: House Home Access: Level entry     Home Layout: Two level Home Equipment: Grab bars - tub/shower;Walker - 4 wheels;Shower seat Additional Comments: Pt reporting similar information about home info as in previous admission    Prior Function Level of Independence: Needs assistance   Gait / Transfers Assistance Needed: per pt, uses 3 wheeled  Rollator for mobility  ADL's / Homemaking Assistance Needed: Reports she has been sponge bathing  Comments: unsure of PLOF due to pt poor historian  Hand Dominance   Dominant Hand: Right    Extremity/Trunk Assessment   Upper Extremity Assessment Upper Extremity Assessment: Defer to OT evaluation    Lower Extremity Assessment Lower Extremity Assessment: Generalized weakness    Cervical / Trunk Assessment Cervical / Trunk Assessment: Other exceptions Cervical / Trunk Exceptions: Recent L rib fxs  Communication   Communication: HOH  Cognition  Arousal/Alertness: Awake/alert Behavior During Therapy: Flat affect;Restless;Impulsive Overall Cognitive Status: No family/caregiver present to determine baseline cognitive functioning Area of Impairment: Orientation;Attention;Memory;Following commands;Safety/judgement;Awareness;Problem solving                 Orientation Level: Disoriented to;Place;Time;Situation Current Attention Level: Sustained Memory: Decreased short-term memory Following Commands: Follows one step commands with increased time Safety/Judgement: Decreased awareness of safety;Decreased awareness of deficits Awareness: Intellectual Problem Solving: Slow processing;Difficulty sequencing;Requires verbal cues;Requires tactile cues General Comments: Pt A&OX2 able to guess it is the 5th or 6 of June. And reports she is at [facility]. Tangential conversation throughout, difficulty forming full coherent conversation. Pt asked repeatedly "Why am I here?" and "what is going on with me"      General Comments General comments (skin integrity, edema, etc.): HR in mid to upper 120s during activity.    Exercises     Assessment/Plan    PT Assessment Patient needs continued PT services  PT Problem List Decreased strength;Decreased activity tolerance;Decreased balance;Decreased mobility;Decreased knowledge of use of DME;Decreased cognition;Decreased safety awareness;Decreased knowledge of precautions;Pain       PT Treatment Interventions DME instruction;Gait training;Functional mobility training;Therapeutic exercise;Therapeutic activities;Balance training;Patient/family education;Cognitive remediation    PT Goals (Current goals can be found in the Care Plan section)  Acute Rehab PT Goals Patient Stated Goal: "to figure out what is going on" PT Goal Formulation: With patient Time For Goal Achievement: 01/25/21 Potential to Achieve Goals: Fair    Frequency Min 2X/week   Barriers to discharge        Co-evaluation                AM-PAC PT "6 Clicks" Mobility  Outcome Measure Help needed turning from your back to your side while in a flat bed without using bedrails?: A Little Help needed moving from lying on your back to sitting on the side of a flat bed without using bedrails?: A Lot Help needed moving to and from a bed to a chair (including a wheelchair)?: Total Help needed standing up from a chair using your arms (e.g., wheelchair or bedside chair)?: Total Help needed to walk in hospital room?: Total Help needed climbing 3-5 steps with a railing? : Total 6 Click Score: 9    End of Session   Activity Tolerance: Patient tolerated treatment well Patient left: in bed;with call bell/phone within reach;with bed alarm set;with nursing/sitter in room Nurse Communication: Mobility status PT Visit Diagnosis: Unsteadiness on feet (R26.81);Muscle weakness (generalized) (M62.81);History of falling (Z91.81);Difficulty in walking, not elsewhere classified (R26.2)    Time: 7939-0300 PT Time Calculation (min) (ACUTE ONLY): 15 min   Charges:   PT Evaluation $PT Eval Moderate Complexity: 1 Mod          Reuel Derby, PT, DPT  Acute Rehabilitation Services  Pager: [phone] Office: [phone]   Rudean Hitt 01/11/2021, 12:45 PM

## 2021-01-12 ENCOUNTER — Ambulatory Visit: Payer: Medicare Other

## 2021-01-12 ENCOUNTER — Other Ambulatory Visit (HOSPITAL_BASED_OUTPATIENT_CLINIC_OR_DEPARTMENT_OTHER): Payer: Self-pay

## 2021-01-12 LAB — HEMOGLOBIN A1C
Hgb A1c MFr Bld: 7.4 % — ABNORMAL HIGH (ref 4.8–5.6)
Mean Plasma Glucose: 166 mg/dL

## 2021-01-12 LAB — CBC
HCT: 40.4 % (ref 36.0–46.0)
Hemoglobin: 12.3 g/dL (ref 12.0–15.0)
MCH: 31.4 pg (ref 26.0–34.0)
MCHC: 30.4 g/dL (ref 30.0–36.0)
MCV: 103.1 fL — ABNORMAL HIGH (ref 80.0–100.0)
Platelets: 109 10*3/uL — ABNORMAL LOW (ref 150–400)
RBC: 3.92 MIL/uL (ref 3.87–5.11)
RDW: 14.8 % (ref 11.5–15.5)
WBC: 11.3 10*3/uL — ABNORMAL HIGH (ref 4.0–10.5)
nRBC: 0 % (ref 0.0–0.2)

## 2021-01-12 LAB — CULTURE, BLOOD (SINGLE): Special Requests: ADEQUATE

## 2021-01-12 LAB — COMPREHENSIVE METABOLIC PANEL
ALT: 24 U/L (ref 0–44)
AST: 25 U/L (ref 15–41)
Albumin: 2.3 g/dL — ABNORMAL LOW (ref 3.5–5.0)
Alkaline Phosphatase: 107 U/L (ref 38–126)
Anion gap: 8 (ref 5–15)
BUN: 35 mg/dL — ABNORMAL HIGH (ref 8–23)
CO2: 27 mmol/L (ref 22–32)
Calcium: 8.6 mg/dL — ABNORMAL LOW (ref 8.9–10.3)
Chloride: 99 mmol/L (ref 98–111)
Creatinine, Ser: 1.6 mg/dL — ABNORMAL HIGH (ref 0.44–1.00)
GFR, Estimated: 32 mL/min — ABNORMAL LOW (ref >60)
Glucose, Bld: 158 mg/dL — ABNORMAL HIGH (ref 70–99)
Potassium: 4.5 mmol/L (ref 3.5–5.1)
Sodium: 134 mmol/L — ABNORMAL LOW (ref 135–145)
Total Bilirubin: 0.5 mg/dL (ref 0.3–1.2)
Total Protein: 5.8 g/dL — ABNORMAL LOW (ref 6.5–8.1)

## 2021-01-12 LAB — GLUCOSE, CAPILLARY
Glucose-Capillary: 148 mg/dL — ABNORMAL HIGH (ref 70–99)
Glucose-Capillary: 151 mg/dL — ABNORMAL HIGH (ref 70–99)
Glucose-Capillary: 119 mg/dL — ABNORMAL HIGH (ref 70–99)
Glucose-Capillary: 131 mg/dL — ABNORMAL HIGH (ref 70–99)
Glucose-Capillary: 109 mg/dL — ABNORMAL HIGH (ref 70–99)

## 2021-01-12 MED ORDER — FUROSEMIDE 10 MG/ML IJ SOLN
20.0000 mg | Freq: Once | INTRAMUSCULAR | Status: AC
  Administered 2021-01-12: 20 mg via INTRAVENOUS
  Filled 2021-01-12: qty 2

## 2021-01-12 MED ORDER — FOOD THICKENER (SIMPLYTHICK)
10.0000 | ORAL | Status: DC | PRN
  Administered 2021-01-15: 1 via ORAL
  Filled 2021-01-12 (×4): qty 10

## 2021-01-12 MED ORDER — MAGNESIUM SULFATE 2 GM/50ML IV SOLN
2.0000 g | Freq: Once | INTRAVENOUS | Status: AC
  Administered 2021-01-12: 2 g via INTRAVENOUS
  Filled 2021-01-12: qty 50

## 2021-01-12 NOTE — NC FL2 (Signed)
[place] LEVEL OF CARE SCREENING TOOL     IDENTIFICATION  Patient Name: Deanna Salinas Birthdate: [birth year] Sex: female Admission Date (Current Location): 01/10/2021  [facility] and [place] Number:  Herbalist and Address:  [facility], [place] [address] [place], [place] [address]      Provider Number: [id number]  Attending Physician Name and Address:  Geradine Girt, DO  Relative Name and Phone Number:       Current Level of Care: Hospital Recommended Level of Care: [place] Prior Approval Number:    Date Approved/Denied:   PASRR Number: pending  Discharge Plan: SNF    Current Diagnoses: Patient Active Problem List   Diagnosis Date Noted  . Sepsis due to pneumonia ([place]) 01/10/2021  . Chest pain of uncertain etiology 70/26/3785  . Rib fractures 01/10/2021  . Bronchitis   . Respiratory distress 06/04/2020  . Hypoxia 06/04/2020  . Reactive airway disease 06/04/2020  . Primary osteoarthritis of both hands 01/23/2018  . Primary osteoarthritis of both knees 01/23/2018  . Primary osteoarthritis of both feet 01/23/2018  . Other idiopathic scoliosis, lumbar region 01/23/2018  . DDD (degenerative disc disease), lumbar 01/23/2018  . Hypothyroidism 08/20/2017  . Morbid obesity ([place]) 12/11/2016  . Leukocytosis 06/18/2015  . Type 2 diabetes mellitus ([place]) 06/16/2015  . Abdominal pain, epigastric 02/18/2014  . Elevated lipase 02/18/2014  . Pancreatitis, acute 12/11/2013  . POSTMENOPAUSAL OSTEOPOROSIS 02/17/2009  . Osteoarthritis 03/29/2008  . Chronic insomnia 09/14/2007  . Hyperlipidemia 08/26/2006  . DEPRESSION 08/26/2006  . MIGRAINE HEADACHE 08/26/2006  . CATARACT NOS 08/26/2006  . Essential hypertension 08/26/2006  . SCIATICA 08/26/2006    Orientation RESPIRATION BLADDER Height & Weight     Self,Time,Place  Normal Continent Weight:   Height:     BEHAVIORAL SYMPTOMS/MOOD NEUROLOGICAL BOWEL NUTRITION  STATUS      Continent Diet (See DC summary)  AMBULATORY STATUS COMMUNICATION OF NEEDS Skin   Extensive Assist Verbally Normal                       Personal Care Assistance Level of Assistance  Bathing,Feeding,Dressing Bathing Assistance: Maximum assistance Feeding assistance: Limited assistance Dressing Assistance: Maximum assistance     Functional Limitations Info  Speech,Hearing,Sight Sight Info: Adequate Hearing Info: Adequate Speech Info: Adequate    SPECIAL CARE FACTORS FREQUENCY  OT (By licensed OT),PT (By licensed PT)     PT Frequency: 5x a week OT Frequency: 5x a week            Contractures Contractures Info: Not present    Additional Factors Info  Code Status,Allergies Code Status Info: SNR Allergies Info: Ambien (Zolpidem Tartrate)   Lipitor (Atorvastatin)   Metformin And Related   Pravastatin   Trazodone And Nefazodone           Current Medications (01/12/2021):  This is the current hospital active medication list Current Facility-Administered Medications  Medication Dose Route Frequency Provider Last Rate Last Admin  . acetaminophen (TYLENOL) tablet 650 mg  650 mg Oral Q6H PRN Karmen Bongo, MD   650 mg at 01/12/21 8850   Or  . acetaminophen (TYLENOL) suppository 650 mg  650 mg Rectal Q6H PRN Karmen Bongo, MD      . albuterol (PROVENTIL) (2.5 MG/3ML) 0.083% nebulizer solution 2.5 mg  2.5 mg Nebulization Q2H PRN Karmen Bongo, MD   2.5 mg at 01/11/21 0604  . bisacodyl (DULCOLAX) EC tablet 5 mg  5 mg Oral Daily PRN Karmen Bongo, MD      . cefTRIAXone (ROCEPHIN) 2 g in sodium chloride 0.9 % 100 mL IVPB  2 g Intravenous Q24H Karmen Bongo, MD   Stopped at 01/12/21 1021  . docusate sodium (COLACE) capsule 100 mg  100 mg Oral BID Karmen Bongo, MD   100 mg at 01/12/21 7628  . enoxaparin (LOVENOX) injection 40 mg  40 mg Subcutaneous Q24H Karmen Bongo, MD   40 mg at 01/12/21 3151  . food thickener (SIMPLYTHICK (NECTAR/LEVEL 2/MILDLY  THICK)) 10 packet  10 packet Oral PRN Eliseo Squires, Jessica U, DO      . guaiFENesin-dextromethorphan (ROBITUSSIN DM) 100-10 MG/5ML syrup 5 mL  5 mL Oral Q4H PRN Karmen Bongo, MD   5 mL at 01/10/21 1611  . hydrALAZINE (APRESOLINE) injection 5 mg  5 mg Intravenous Q4H PRN Karmen Bongo, MD      . insulin aspart (novoLOG) injection 0-15 Units  0-15 Units Subcutaneous TID WC Karmen Bongo, MD   2 Units at 01/12/21 1651  . insulin aspart (novoLOG) injection 0-5 Units  0-5 Units Subcutaneous QHS Karmen Bongo, MD      . lidocaine (LIDODERM) 5 % 1 patch  1 patch Transdermal Q24H Karmen Bongo, MD   1 patch at 01/12/21 [phone]  . nitroGLYCERIN (NITROSTAT) SL tablet 0.4 mg  0.4 mg Sublingual Q5 min PRN Karmen Bongo, MD   0.4 mg at 01/10/21 1541  . nortriptyline (PAMELOR) capsule 10 mg  10 mg Oral Ivery Quale, MD   10 mg at 01/11/21 2150  . ondansetron (ZOFRAN) tablet 4 mg  4 mg Oral Q6H PRN Karmen Bongo, MD   4 mg at 01/10/21 1020   Or  . ondansetron (ZOFRAN) injection 4 mg  4 mg Intravenous Q6H PRN Karmen Bongo, MD      . phenol (CHLORASEPTIC) mouth spray 1 spray  1 spray Mouth/Throat PRN Karmen Bongo, MD   1 spray at 01/10/21 1821  . polyethylene glycol (MIRALAX / GLYCOLAX) packet 17 g  17 g Oral Daily PRN Karmen Bongo, MD      . temazepam (RESTORIL) capsule 30 mg  30 mg Oral QHS Karmen Bongo, MD   30 mg at 01/11/21 2150     Discharge Medications: Please see discharge summary for a list of discharge medications.  Relevant Imaging Results:  Relevant Lab Results:   Additional Information SSN [id number]  Emeterio Reeve, [place]

## 2021-01-12 NOTE — Progress Notes (Addendum)
PROGRESS NOTE  Deanna Salinas UXN:[id number] DOB: [birth year] DOA: 01/10/2021 PCP: Kathyrn Drown, MD   LOS: 2 days   Brief Narrative / Interim history: 85 year old female with history of HTN, HLD, DM2 comes to the hospital with chest pain and shortness of breath.  She apparently recently had a fall and had a fractured rib.  She has been having fever and chills for the past 4 days.  In the ED she was found to be hypoxic with sats in the mid 80s as well as left-sided lobar pneumonia with a trace left-sided pleural effusion.  She was placed on antibiotics and admitted to the hospital  Subjective / 24h Interval events: confused  Assessment & Plan:  Sepsis due to lobar pneumonia, E. coli bacteremia  -blood cultures:  E. coli,  ceftriaxone was increased to 2 g -afebrile, WBC improving  Acute hypoxic respiratory failure due to pneumonia -currently on 2 L, wean off to room air as tolerated.  Acute metabolic encephalopathy -likely in the setting of sepsis -limit medications that can alter mentation  Chest pain with elevated troponin-Patient with LLL PNA and L-sided rib fractures also c/o L-sided CP, appears to be musculoskeletal.  Troponins overall flat, likely demand.  Acute kidney injury-likely in the setting of #1, hold Accupril.  Due to wheezing would favor to stop fluids today and encourage p.o. intake  L Rib Fractures -CXR on admission with rib fractures "including at least the posterior fourth, fifth, seventh ribs", This is likely the source of her left-sided CP (in conjunction with new infiltrate in LLL) -Lidocaine patch provided -Minimize narcotics due to confusion -fell 1-2 weeks ago per husband  HTN -Continue Norvasc, hold Accupril due to acute kidney injury  HLD -she does not appear to be taking medications for this issue at this time  DM -Continue sliding scale     Scheduled Meds: . docusate sodium  100 mg Oral BID  . enoxaparin (LOVENOX) injection  40 mg  Subcutaneous Q24H  . insulin aspart  0-15 Units Subcutaneous TID WC  . insulin aspart  0-5 Units Subcutaneous QHS  . lidocaine  1 patch Transdermal Q24H  . nortriptyline  10 mg Oral QHS  . temazepam  30 mg Oral QHS   Continuous Infusions: . cefTRIAXone (ROCEPHIN)  IV 2 g (01/12/21 0832)   PRN Meds:.acetaminophen **OR** acetaminophen, albuterol, bisacodyl, guaiFENesin-dextromethorphan, hydrALAZINE, nitroGLYCERIN, ondansetron **OR** ondansetron (ZOFRAN) IV, phenol, polyethylene glycol  Diet Orders (From admission, onward)    Start     Ordered   01/10/21 0916  Diet Carb Modified Fluid consistency: Thin; Room service appropriate? Yes  Diet effective now       Question Answer Comment  Diet-HS Snack? Nothing   Calorie Level Medium 1600-2000   Fluid consistency: Thin   Room service appropriate? Yes      01/10/21 0915          DVT prophylaxis: enoxaparin (LOVENOX) injection 40 mg Start: 01/10/21 1000     Code Status: DNR  Family Communication: called husband  Status is: Inpatient  Remains inpatient appropriate because:Inpatient level of care appropriate due to severity of illness   Dispo: The patient is from: Home              Anticipated d/c is to: Home              Patient currently is not medically stable to d/c.   Difficult to place patient No  Level of care: Telemetry Medical   Microbiology  Blood  cultures-E. coli  Antimicrobials: Ceftriaxone   Objective: Vitals:   01/12/21 0620 01/12/21 0812 01/12/21 1014 01/12/21 1200  BP: 118/78 110/77 114/71 123/73  Pulse: (!) 118 (!) 109 99 99  Resp: 18 16 16 16   Temp: 98.3 F (36.8 C) 97.9 F (36.6 C) 97.8 F (36.6 C) (!) 97.3 F (36.3 C)  TempSrc: Oral Oral Oral Oral  SpO2: 96% 94% 96% 98%    Intake/Output Summary (Last 24 hours) at 01/12/2021 1302 Last data filed at 01/11/2021 1954 Gross per 24 hour  Intake 580 ml  Output --  Net 580 ml   There were no vitals filed for this  visit.  Examination:   General: Appearance:    Obese female in no acute distress     Lungs:     On , poor effort  Heart:    Normal heart rate.   MS:   All extremities are intact. +edema  Neurologic:   Sleepy but will awaken and is appropriate   l  Data Reviewed: I have independently reviewed following labs and imaging studies   CBC: Recent Labs  Lab 01/10/21 0417 01/11/21 0313 01/12/21 0620  WBC 20.1* 15.0* 11.3*  NEUTROABS 17.2*  --   --   HGB 13.2 11.9* 12.3  HCT 40.9 38.3 40.4  MCV 97.8 101.6* 103.1*  PLT 135* 115* 532*   Basic Metabolic Panel: Recent Labs  Lab 01/10/21 0417 01/11/21 0313 01/12/21 0620  NA 136 135 134*  K 3.7 4.5 4.5  CL 101 101 99  CO2 24 26 27   GLUCOSE 175* 150* 158*  BUN 24* 30* 35*  CREATININE 1.32* 1.44* 1.60*  CALCIUM 8.0* 8.0* 8.6*  MG 1.5*  --   --    Liver Function Tests: Recent Labs  Lab 01/10/21 0417 01/12/21 0620  AST 24 25  ALT 17 24  ALKPHOS 113 107  BILITOT 0.8 0.5  PROT 5.4* 5.8*  ALBUMIN 2.5* 2.3*   Coagulation Profile: Recent Labs  Lab 01/10/21 0417  INR 1.2   HbA1C: Recent Labs    01/10/21 1822  HGBA1C 7.4*   CBG: Recent Labs  Lab 01/11/21 1055 01/11/21 1617 01/11/21 2123 01/12/21 0617 01/12/21 1120  GLUCAP 152* 167* 148* 151* 119*    Recent Results (from the past 240 hour(s))  Blood culture (routine single)     Status: Abnormal   Collection Time: 01/10/21  4:07 AM   Specimen: BLOOD  Result Value Ref Range Status   Specimen Description BLOOD RIGHT ANTECUBITAL  Final   Special Requests   Final    BOTTLES DRAWN AEROBIC AND ANAEROBIC Blood Culture adequate volume   Culture  Setup Time   Final    GRAM NEGATIVE RODS IN BOTH AEROBIC AND ANAEROBIC BOTTLES CRITICAL RESULT CALLED TO, READ BACK BY AND VERIFIED WITH: PHARM D A.MAYER ON [id number] AT [place] Performed at [facility], [place] [address], [place], [place] [address]    Culture ESCHERICHIA COLI (A)  Final   Report Status  01/12/2021 FINAL  Final   Organism ID, Bacteria ESCHERICHIA COLI  Final      Susceptibility   Escherichia coli - MIC*    AMPICILLIN >=32 RESISTANT Resistant     CEFAZOLIN <=4 SENSITIVE Sensitive     CEFEPIME <=0.12 SENSITIVE Sensitive     CEFTAZIDIME <=1 SENSITIVE Sensitive     CEFTRIAXONE <=0.25 SENSITIVE Sensitive     CIPROFLOXACIN <=0.25 SENSITIVE Sensitive     GENTAMICIN <=1 SENSITIVE Sensitive  IMIPENEM <=0.25 SENSITIVE Sensitive     TRIMETH/SULFA >=320 RESISTANT Resistant     AMPICILLIN/SULBACTAM 8 SENSITIVE Sensitive     PIP/TAZO <=4 SENSITIVE Sensitive     * ESCHERICHIA COLI  Blood Culture ID Panel (Reflexed)     Status: Abnormal   Collection Time: 01/10/21  4:07 AM  Result Value Ref Range Status   Enterococcus faecalis NOT DETECTED NOT DETECTED Final   Enterococcus Faecium NOT DETECTED NOT DETECTED Final   Listeria monocytogenes NOT DETECTED NOT DETECTED Final   Staphylococcus species NOT DETECTED NOT DETECTED Final   Staphylococcus aureus (BCID) NOT DETECTED NOT DETECTED Final   Staphylococcus epidermidis NOT DETECTED NOT DETECTED Final   Staphylococcus lugdunensis NOT DETECTED NOT DETECTED Final   Streptococcus species NOT DETECTED NOT DETECTED Final   Streptococcus agalactiae NOT DETECTED NOT DETECTED Final   Streptococcus pneumoniae NOT DETECTED NOT DETECTED Final   Streptococcus pyogenes NOT DETECTED NOT DETECTED Final   A.calcoaceticus-baumannii NOT DETECTED NOT DETECTED Final   Bacteroides fragilis NOT DETECTED NOT DETECTED Final   Enterobacterales DETECTED (A) NOT DETECTED Final    Comment: Enterobacterales represent a large order of gram negative bacteria, not a single organism. CRITICAL RESULT CALLED TO, READ BACK BY AND VERIFIED WITH: PHARM D A.MAYER ON [id number] AT 1957 BY E.PARRISH    Enterobacter cloacae complex NOT DETECTED NOT DETECTED Final   Escherichia coli DETECTED (A) NOT DETECTED Final    Comment: CRITICAL RESULT CALLED TO, READ BACK BY AND  VERIFIED WITH: PHARM D A.MAYER ON [id number] AT 1957 BY E.PARRISH    Klebsiella aerogenes NOT DETECTED NOT DETECTED Final   Klebsiella oxytoca NOT DETECTED NOT DETECTED Final   Klebsiella pneumoniae NOT DETECTED NOT DETECTED Final   Proteus species NOT DETECTED NOT DETECTED Final   Salmonella species NOT DETECTED NOT DETECTED Final   Serratia marcescens NOT DETECTED NOT DETECTED Final   Haemophilus influenzae NOT DETECTED NOT DETECTED Final   Neisseria meningitidis NOT DETECTED NOT DETECTED Final   Pseudomonas aeruginosa NOT DETECTED NOT DETECTED Final   Stenotrophomonas maltophilia NOT DETECTED NOT DETECTED Final   Candida albicans NOT DETECTED NOT DETECTED Final   Candida auris NOT DETECTED NOT DETECTED Final   Candida glabrata NOT DETECTED NOT DETECTED Final   Candida krusei NOT DETECTED NOT DETECTED Final   Candida parapsilosis NOT DETECTED NOT DETECTED Final   Candida tropicalis NOT DETECTED NOT DETECTED Final   Cryptococcus neoformans/gattii NOT DETECTED NOT DETECTED Final   CTX-M ESBL NOT DETECTED NOT DETECTED Final   Carbapenem resistance IMP NOT DETECTED NOT DETECTED Final   Carbapenem resistance KPC NOT DETECTED NOT DETECTED Final   Carbapenem resistance NDM NOT DETECTED NOT DETECTED Final   Carbapenem resist OXA 48 LIKE NOT DETECTED NOT DETECTED Final   Carbapenem resistance VIM NOT DETECTED NOT DETECTED Final    Comment: Performed at [facility], [address] [place], [place] [address]  Resp Panel by RT-PCR (Flu A&B, Covid) Nasopharyngeal Swab     Status: None   Collection Time: 01/10/21  4:15 AM   Specimen: Nasopharyngeal Swab; Nasopharyngeal(NP) swabs in vial transport medium  Result Value Ref Range Status   SARS Coronavirus 2 by RT PCR NEGATIVE NEGATIVE Final    Comment: (NOTE) SARS-CoV-2 target nucleic acids are NOT DETECTED.  The SARS-CoV-2 RNA is generally detectable in upper respiratory specimens during the acute phase of infection. The  lowest concentration of SARS-CoV-2 viral copies this assay can detect is 138 copies/mL. A negative result does not preclude  SARS-Cov-2 infection and should not be used as the sole basis for treatment or other patient management decisions. A negative result may occur with  improper specimen collection/handling, submission of specimen other than nasopharyngeal swab, presence of viral mutation(s) within the areas targeted by this assay, and inadequate number of viral copies(<138 copies/mL). A negative result must be combined with clinical observations, patient history, and epidemiological information. The expected result is Negative.  Fact Sheet for Patients:  EntrepreneurPulse.com.au  Fact Sheet for Healthcare Providers:  IncredibleEmployment.be  This test is no t yet approved or cleared by the [place] FDA and  has been authorized for detection and/or diagnosis of SARS-CoV-2 by FDA under an Emergency Use Authorization (EUA). This EUA will remain  in effect (meaning this test can be used) for the duration of the COVID-19 declaration under Section 564(b)(1) of the Act, 21 U.S.C.section 360bbb-3(b)(1), unless the authorization is terminated  or revoked sooner.       Influenza A by PCR NEGATIVE NEGATIVE Final   Influenza B by PCR NEGATIVE NEGATIVE Final    Comment: (NOTE) The Xpert Xpress SARS-CoV-2/FLU/RSV plus assay is intended as an aid in the diagnosis of influenza from Nasopharyngeal swab specimens and should not be used as a sole basis for treatment. Nasal washings and aspirates are unacceptable for Xpert Xpress SARS-CoV-2/FLU/RSV testing.  Fact Sheet for Patients: EntrepreneurPulse.com.au  Fact Sheet for Healthcare Providers: IncredibleEmployment.be  This test is not yet approved or cleared by the [place] FDA and has been authorized for detection and/or diagnosis of SARS-CoV-2 by FDA under  an Emergency Use Authorization (EUA). This EUA will remain in effect (meaning this test can be used) for the duration of the COVID-19 declaration under Section 564(b)(1) of the Act, 21 U.S.C. section 360bbb-3(b)(1), unless the authorization is terminated or revoked.  Performed at [facility], [place] [address], [place], [place] [address]      Radiology Studies: No results found.  Eulogio Bear DO [facility]  Between 7 am - 7 pm I am available, please contact me via Amion (for emergencies) or Securechat (non urgent messages)  Between 7 pm - 7 am I am not available, please contact night coverage MD/APP via Amion

## 2021-01-12 NOTE — Progress Notes (Signed)
Pt remains confused and restless. Pt continues taking off nasal cannula providing oxygenation. Without supplemental oxygen, Pt oxygen saturation is 78%-85% on room air while laying in bed. When placed on supplemental oxygen, sats increased to 96%.  Mittens placed on patient to prevent her from removing supplemental oxygen, and removing cardiac leads. Pt pulled saline lock IV line out of forearm. Bleeding controlled at site.

## 2021-01-12 NOTE — TOC Initial Note (Signed)
Transition of Care [facility]) - Initial/Assessment Note    Patient Details  Name: Deanna Salinas MRN: [id number] Date of Birth: May 16, 1936  Transition of [facility]) CM/SW Contact:    Emeterio Reeve, [place] Phone Number: 01/12/2021, 4:44 PM  Clinical Narrative:                  CSW spoke to pts daughter Barnetta Chapel [phone] via phone. Barnetta Chapel reports that PTA pt was living at home with her husband. Pt was independent proir to her fall. After her fall pt required light assistance with ADL's. Pt is covid vaccinated with booster.   CSW reviewed PT/OT notes of SNF. Barnetta Chapel gave CSW permission to fax out to facilities in the [employer] are. Barnetta Chapel inquired about pt going to SNF in [place]. CSW stated she can fax out to those facilities upon request. Barnetta Chapel stated she will talk to her siblings about what facilities in [place] they would want her to go to.   Expected Discharge Plan: Skilled Nursing Facility Barriers to Discharge: Continued Medical Work up   Patient Goals and CMS Choice Patient states their goals for this hospitalization and ongoing recovery are:: to get better CMS Medicare.gov Compare Post Acute Care list provided to:: Patient Choice offered to / list presented to : Spouse,Adult Children,Patient  Expected Discharge Plan and Services Expected Discharge Plan: [place]       Living arrangements for the past 2 months: Single Family Home                                      Prior Living Arrangements/Services Living arrangements for the past 2 months: Single Family Home Lives with:: Self Patient language and need for interpreter reviewed:: Yes Do you feel safe going back to the place where you live?: Yes      Need for Family Participation in Patient Care: Yes (Comment) Care giver support system in place?: Yes (comment)   Criminal Activity/Legal Involvement Pertinent to Current Situation/Hospitalization: No - Comment as  needed  Activities of Daily Living Home Assistive Devices/Equipment: Walker (specify type) ADL Screening (condition at time of admission) Patient's cognitive ability adequate to safely complete daily activities?: Yes Is the patient deaf or have difficulty hearing?: Yes Does the patient have difficulty seeing, even when wearing glasses/contacts?: Yes Does the patient have difficulty concentrating, remembering, or making decisions?: No Patient able to express need for assistance with ADLs?: Yes Does the patient have difficulty dressing or bathing?: No Independently performs ADLs?: Yes (appropriate for developmental age) Does the patient have difficulty walking or climbing stairs?: Yes Weakness of Legs: None Weakness of Arms/Hands: None  Permission Sought/Granted Permission sought to share information with : Family Chief Financial Officer Permission granted to share information with : Yes, Verbal Permission Granted     Permission granted to share info w AGENCY: SNF        Emotional Assessment Appearance:: Appears stated age Attitude/Demeanor/Rapport: Engaged Affect (typically observed): Appropriate Orientation: : Oriented to Place,Oriented to Self,Oriented to  Time,Oriented to Situation Alcohol / Substance Use: Not Applicable Psych Involvement: No (comment)  Admission diagnosis:  CAP (community acquired pneumonia) [J18.9] Community acquired pneumonia of left lower lobe of lung [J18.9] Patient Active Problem List   Diagnosis Date Noted  . Sepsis due to pneumonia ([place]) 01/10/2021  . Chest pain of uncertain etiology 06/02/8526  . Rib fractures 01/10/2021  . Bronchitis   .  Respiratory distress 06/04/2020  . Hypoxia 06/04/2020  . Reactive airway disease 06/04/2020  . Primary osteoarthritis of both hands 01/23/2018  . Primary osteoarthritis of both knees 01/23/2018  . Primary osteoarthritis of both feet 01/23/2018  . Other idiopathic scoliosis, lumbar region  01/23/2018  . DDD (degenerative disc disease), lumbar 01/23/2018  . Hypothyroidism 08/20/2017  . Morbid obesity ([place]) 12/11/2016  . Leukocytosis 06/18/2015  . Type 2 diabetes mellitus ([place]) 06/16/2015  . Abdominal pain, epigastric 02/18/2014  . Elevated lipase 02/18/2014  . Pancreatitis, acute 12/11/2013  . POSTMENOPAUSAL OSTEOPOROSIS 02/17/2009  . Osteoarthritis 03/29/2008  . Chronic insomnia 09/14/2007  . Hyperlipidemia 08/26/2006  . DEPRESSION 08/26/2006  . MIGRAINE HEADACHE 08/26/2006  . CATARACT NOS 08/26/2006  . Essential hypertension 08/26/2006  . SCIATICA 08/26/2006   PCP:  Kathyrn Drown, MD Pharmacy:   [facility], [place] [address] [place] [address] Phone: [phone] Fax: [place], [place] [address] [place] [address] Phone: [phone] Fax: [place], [place] [address] [place] [address] Phone: [phone] Fax: [phone]     Social Determinants of Health (SDOH) Interventions    Readmission Risk Interventions No flowsheet data found.  Emeterio Reeve, Latanya Presser, [place] Social Worker [phone]

## 2021-01-12 NOTE — Evaluation (Signed)
Clinical/Bedside Swallow Evaluation Patient Details  Name: Deanna Salinas MRN: [id number] Date of Birth: 04/20/1936  Today's Date: 01/12/2021 Time: SLP Start Time (ACUTE ONLY): 1450 SLP Stop Time (ACUTE ONLY): 1505 SLP Time Calculation (min) (ACUTE ONLY): 15 min  Past Medical History:  Past Medical History:  Diagnosis Date  . Essential hypertension   . Fatty liver   . Hyperlipidemia   . Type 2 diabetes mellitus ([place])    Past Surgical History:  Past Surgical History:  Procedure Laterality Date  . APPENDECTOMY     At the time of hysterectomy  . CESAREAN SECTION     X5  . COLONOSCOPY    . COLONOSCOPY N/A 06/11/2013   Colonic diverticulosis, hyperplastic polyps. no further screening colonoscopies recommended  . ESOPHAGOGASTRODUODENOSCOPY N/A 02/20/2014   Procedure: ESOPHAGOGASTRODUODENOSCOPY (EGD);  Surgeon: Daneil Dolin, MD;  Location: [facility];  Service: Endoscopy;  Laterality: N/A;  11:84   HPI:  85 year old female who presented to ED on 6/4 due to chest pain, fever and shortness of breath. In the ED she was found to be hypoxic with sats in the mid 80s as well as left-sided lobar pneumonia with a trace left-sided pleural effusion. Pt diagnosed with sepsis, acute metabolic encephalopathy. Pt also with recent fall and L rib fractures. PMH: HTN, HLD, DM2   Assessment / Plan / Recommendation Clinical Impression  Pt's lethargy and encephalopathy appear to be primary barriers to safe eating. She could be roused for participation; had difficulty understanding purpose of visit and was reluctant to [place].  There was adequate oral attention, prolonged mastication, and what appeared to be generalized delays.  Consumption of thin liquids led to consistent coughing, concerning for potential aspiration; coughing did NOT occur with nectars. For now, recommend thickening liquids to nectar - SLP will follow for safety and to determine if further testing is warranted.  Hopefully as mental  status improves, functionality of swallow will improve as well. SLP Visit Diagnosis: Dysphagia, unspecified (R13.10)    Aspiration Risk  Mild aspiration risk    Diet Recommendation   regular solids, nectar thick liquids  Medication Administration: Whole meds with puree    Other  Recommendations Oral Care Recommendations: Oral care BID Other Recommendations: Order thickener from pharmacy   Follow up Recommendations None      Frequency and Duration min 2x/week  1 week       Prognosis Prognosis for Safe Diet Advancement: Good      Swallow Study   General HPI: 85 year old female who presented to ED on 6/4 due to chest pain, fever and shortness of breath. In the ED she was found to be hypoxic with sats in the mid 80s as well as left-sided lobar pneumonia with a trace left-sided pleural effusion. Pt diagnosed with sepsis, acute metabolic encephalopathy. Pt also with recent fall and L rib fractures. PMH: HTN, HLD, DM2 Type of Study: Bedside Swallow Evaluation Previous Swallow Assessment: no Diet Prior to this Study: Regular;Thin liquids Temperature Spikes Noted: No Respiratory Status: Room air History of Recent Intubation: No Behavior/Cognition: Confused;Lethargic/Drowsy Oral Cavity Assessment: Within Functional Limits Oral Care Completed by SLP: Recent completion by staff Self-Feeding Abilities: Needs assist Patient Positioning: Upright in bed Baseline Vocal Quality: Normal Volitional Cough: Cognitively unable to elicit Volitional Swallow: Able to elicit    Oral/Motor/Sensory Function Overall Oral Motor/Sensory Function: Within functional limits   Ice Chips Ice chips: Within functional limits   Thin Liquid Thin Liquid: Impaired Presentation: Cup;Straw Pharyngeal  Phase Impairments: Cough -  Immediate    Nectar Thick Nectar Thick Liquid: Within functional limits   Honey Thick Honey Thick Liquid: Not tested   Puree Puree: Within functional limits   Solid     Solid:  Impaired Oral Phase Functional Implications: Prolonged oral transit      Juan Quam Laurice 01/12/2021,3:10 PM  Estill Bamberg L. Tivis Ringer, [place] Office number [phone] Pager [phone]

## 2021-01-12 NOTE — Social Work (Addendum)
Deanna Salinas 1935/09/07  Please be advised that the above-named patient will require a short-term nursing home stay - anticipated 30 days or less for rehabilitation and strengthening.  The plan is for return home.

## 2021-01-13 ENCOUNTER — Inpatient Hospital Stay (HOSPITAL_COMMUNITY): Payer: Medicare Other

## 2021-01-13 ENCOUNTER — Encounter: Payer: Self-pay | Admitting: Family Medicine

## 2021-01-13 DIAGNOSIS — G4709 Other insomnia: Secondary | ICD-10-CM

## 2021-01-13 DIAGNOSIS — E119 Type 2 diabetes mellitus without complications: Secondary | ICD-10-CM

## 2021-01-13 DIAGNOSIS — A419 Sepsis, unspecified organism: Secondary | ICD-10-CM

## 2021-01-13 DIAGNOSIS — R079 Chest pain, unspecified: Secondary | ICD-10-CM

## 2021-01-13 DIAGNOSIS — E785 Hyperlipidemia, unspecified: Secondary | ICD-10-CM

## 2021-01-13 DIAGNOSIS — I10 Essential (primary) hypertension: Secondary | ICD-10-CM

## 2021-01-13 DIAGNOSIS — J189 Pneumonia, unspecified organism: Secondary | ICD-10-CM

## 2021-01-13 LAB — GLUCOSE, CAPILLARY
Glucose-Capillary: 109 mg/dL — ABNORMAL HIGH (ref 70–99)
Glucose-Capillary: 136 mg/dL — ABNORMAL HIGH (ref 70–99)
Glucose-Capillary: 141 mg/dL — ABNORMAL HIGH (ref 70–99)
Glucose-Capillary: 134 mg/dL — ABNORMAL HIGH (ref 70–99)

## 2021-01-13 LAB — CBC
HCT: 41.1 % (ref 36.0–46.0)
Hemoglobin: 13.1 g/dL (ref 12.0–15.0)
MCH: 31.9 pg (ref 26.0–34.0)
MCHC: 31.9 g/dL (ref 30.0–36.0)
MCV: 100 fL (ref 80.0–100.0)
Platelets: 142 10*3/uL — ABNORMAL LOW (ref 150–400)
RBC: 4.11 MIL/uL (ref 3.87–5.11)
RDW: 14.5 % (ref 11.5–15.5)
WBC: 10.6 10*3/uL — ABNORMAL HIGH (ref 4.0–10.5)
nRBC: 0 % (ref 0.0–0.2)

## 2021-01-13 LAB — BASIC METABOLIC PANEL
Anion gap: 9 (ref 5–15)
BUN: 49 mg/dL — ABNORMAL HIGH (ref 8–23)
CO2: 27 mmol/L (ref 22–32)
Calcium: 8.7 mg/dL — ABNORMAL LOW (ref 8.9–10.3)
Chloride: 100 mmol/L (ref 98–111)
Creatinine, Ser: 1.56 mg/dL — ABNORMAL HIGH (ref 0.44–1.00)
GFR, Estimated: 33 mL/min — ABNORMAL LOW (ref >60)
Glucose, Bld: 119 mg/dL — ABNORMAL HIGH (ref 70–99)
Potassium: 4.1 mmol/L (ref 3.5–5.1)
Sodium: 136 mmol/L (ref 135–145)

## 2021-01-13 LAB — MAGNESIUM: Magnesium: 2.3 mg/dL (ref 1.7–2.4)

## 2021-01-13 MED ORDER — SODIUM CHLORIDE 0.9 % IV SOLN
3.0000 g | Freq: Four times a day (QID) | INTRAVENOUS | Status: AC
  Administered 2021-01-14 – 2021-01-18 (×18): 3 g via INTRAVENOUS
  Filled 2021-01-13: qty 3
  Filled 2021-01-13 (×2): qty 8
  Filled 2021-01-13: qty 3
  Filled 2021-01-13 (×4): qty 8
  Filled 2021-01-13 (×2): qty 3
  Filled 2021-01-13: qty 8
  Filled 2021-01-13: qty 3
  Filled 2021-01-13: qty 8
  Filled 2021-01-13 (×2): qty 3
  Filled 2021-01-13: qty 8
  Filled 2021-01-13: qty 3
  Filled 2021-01-13 (×5): qty 8

## 2021-01-13 MED ORDER — METHYLPREDNISOLONE SODIUM SUCC 125 MG IJ SOLR
80.0000 mg | Freq: Once | INTRAMUSCULAR | Status: AC
  Administered 2021-01-13: 80 mg via INTRAVENOUS
  Filled 2021-01-13: qty 2

## 2021-01-13 MED ORDER — PROCHLORPERAZINE EDISYLATE 10 MG/2ML IJ SOLN
10.0000 mg | Freq: Once | INTRAMUSCULAR | Status: AC
  Administered 2021-01-13: 10 mg via INTRAVENOUS
  Filled 2021-01-13: qty 2

## 2021-01-13 MED ORDER — LORAZEPAM 2 MG/ML IJ SOLN
1.0000 mg | INTRAMUSCULAR | Status: AC | PRN
  Administered 2021-01-13 – 2021-01-14 (×4): 2 mg via INTRAVENOUS
  Administered 2021-01-14: 1 mg via INTRAVENOUS
  Filled 2021-01-13 (×5): qty 1

## 2021-01-13 MED ORDER — LORAZEPAM 1 MG PO TABS: 1.0000 mg | ORAL_TABLET | ORAL | Status: AC | PRN

## 2021-01-13 MED ORDER — DIPHENHYDRAMINE HCL 50 MG/ML IJ SOLN
12.5000 mg | Freq: Once | INTRAMUSCULAR | Status: AC
  Administered 2021-01-13: 12.5 mg via INTRAVENOUS
  Filled 2021-01-13: qty 1

## 2021-01-13 MED ORDER — THIAMINE HCL 100 MG/ML IJ SOLN
100.0000 mg | Freq: Every day | INTRAMUSCULAR | Status: DC
  Administered 2021-01-13 – 2021-01-14 (×2): 100 mg via INTRAVENOUS
  Filled 2021-01-13 (×3): qty 2

## 2021-01-13 MED ORDER — FUROSEMIDE 10 MG/ML IJ SOLN
20.0000 mg | Freq: Once | INTRAMUSCULAR | Status: AC
  Administered 2021-01-13: 20 mg via INTRAVENOUS
  Filled 2021-01-13: qty 2

## 2021-01-13 MED ORDER — LIDOCAINE 4 % EX CREA
TOPICAL_CREAM | Freq: Three times a day (TID) | CUTANEOUS | Status: DC | PRN
  Administered 2021-01-13: 1 via TOPICAL
  Filled 2021-01-13: qty 5

## 2021-01-13 MED ORDER — MORPHINE SULFATE (PF) 2 MG/ML IV SOLN
1.0000 mg | INTRAVENOUS | Status: DC | PRN
  Administered 2021-01-13 – 2021-01-15 (×2): 1 mg via INTRAVENOUS
  Filled 2021-01-13 (×3): qty 1

## 2021-01-13 MED ORDER — THIAMINE HCL 100 MG PO TABS
100.0000 mg | ORAL_TABLET | Freq: Every day | ORAL | Status: DC
  Administered 2021-01-15 – 2021-01-19 (×5): 100 mg via ORAL
  Filled 2021-01-13 (×5): qty 1

## 2021-01-13 MED ORDER — FOLIC ACID 1 MG PO TABS
1.0000 mg | ORAL_TABLET | Freq: Every day | ORAL | Status: DC
  Administered 2021-01-13 – 2021-01-19 (×7): 1 mg via ORAL
  Filled 2021-01-13 (×7): qty 1

## 2021-01-13 MED ORDER — ADULT MULTIVITAMIN W/MINERALS CH
1.0000 | ORAL_TABLET | Freq: Every day | ORAL | Status: DC
  Administered 2021-01-14 – 2021-01-19 (×6): 1 via ORAL
  Filled 2021-01-13 (×6): qty 1

## 2021-01-13 MED ORDER — MELATONIN 5 MG PO TABS
5.0000 mg | ORAL_TABLET | Freq: Every day | ORAL | Status: DC
  Administered 2021-01-13 – 2021-01-18 (×6): 5 mg via ORAL
  Filled 2021-01-13 (×6): qty 1

## 2021-01-13 NOTE — Progress Notes (Addendum)
PROGRESS NOTE  Deanna Salinas VHQ:[id number] DOB: 1936/01/30 DOA: 01/10/2021 PCP: Kathyrn Drown, MD   LOS: 3 days   Brief Narrative / Interim history: 85 year old female with history of HTN, HLD, DM2 comes to the hospital with chest pain and shortness of breath.  She apparently recently had a fall and had a fractured rib.  She has been having fever and chills for the past 4 days.  In the ED she was found to be hypoxic with sats in the mid 80s as well as left-sided lobar pneumonia with a trace left-sided pleural effusion.  She was placed on antibiotics and admitted to the hospital.  Found to have bacteremia as well.  Also has remained confused at times  Subjective / 24h Interval events: C/o headache-- all over  Assessment & Plan:  Sepsis due to lobar pneumonia, E. coli bacteremia  -blood cultures:  E. coli,  ceftriaxone was increased to 2 g- changed to unasyn- ID auto-consult -afebrile, WBC improving  Acute hypoxic respiratory failure due to pneumonia -currently on 2 L, wean off to room air as tolerated.  Acute metabolic encephalopathy -likely in the setting of sepsis -limit medications that can alter mentation -? Alcohol withdrawal-- 6oz/night per husband-- start CIWA -CT head negative  Chest pain with elevated troponin-Patient with LLL PNA and L-sided rib fractures also c/o L-sided CP, appears to be musculoskeletal.  Troponins overall flat, likely demand. -echo ordered  Acute kidney injury- -? Volume overload -monitor with diuresis  L Rib Fractures -CXR on admission with rib fractures "including at least the posterior fourth, fifth, seventh ribs", This is likely the source of her left-sided CP (in conjunction with new infiltrate in LLL) -Lidocaine patch provided -Minimize narcotics due to confusion -fell 1-2 weeks ago per husband  HTN -Continue Norvasc, hold Accupril due to acute kidney injury  HLD -she does not appear to be taking medications for this issue at this  time  DM -Continue sliding scale  ? COPD vs pulmonary fluid -wheezing Check x ray: left sided pleural effusions -nebs -solumedrol x 1 -re dose lasix-- daily weights and I/Os    Scheduled Meds: . docusate sodium  100 mg Oral BID  . enoxaparin (LOVENOX) injection  40 mg Subcutaneous Q24H  . insulin aspart  0-15 Units Subcutaneous TID WC  . insulin aspart  0-5 Units Subcutaneous QHS  . lidocaine  1 patch Transdermal Q24H  . nortriptyline  10 mg Oral QHS  . temazepam  30 mg Oral QHS   Continuous Infusions: . [START ON 01/14/2021] ampicillin-sulbactam (UNASYN) IV     PRN Meds:.acetaminophen **OR** acetaminophen, albuterol, bisacodyl, food thickener, guaiFENesin-dextromethorphan, hydrALAZINE, lidocaine, morphine injection, nitroGLYCERIN, ondansetron **OR** ondansetron (ZOFRAN) IV, phenol, polyethylene glycol  Diet Orders (From admission, onward)    Start     Ordered   01/12/21 1502  Diet Carb Modified Fluid consistency: Nectar Thick; Room service appropriate? Yes with Assist  Diet effective now       Question Answer Comment  Diet-HS Snack? Nothing   Calorie Level Medium 1600-2000   Fluid consistency: Nectar Thick   Room service appropriate? Yes with Assist      01/12/21 1501          DVT prophylaxis: enoxaparin (LOVENOX) injection 40 mg Start: 01/10/21 1000     Code Status: DNR  Family Communication: husband/daughter  Status is: Inpatient  Remains inpatient appropriate because:Inpatient level of care appropriate due to severity of illness   Dispo: The patient is from: Home  Anticipated d/c is to: Home              Patient currently is not medically stable to d/c.   Difficult to place patient No  Level of care: Telemetry Medical   Microbiology  Blood cultures-E. coli     Objective: Vitals:   01/12/21 1807 01/12/21 2019 01/12/21 2346 01/13/21 0409  BP: 102/83 128/78 128/74 (!) 145/92  Pulse: (!) 101 100 (!) 101 100  Resp: 16 19 18 18    Temp: 98 F (36.7 C) 97.6 F (36.4 C) 97.7 F (36.5 C) 97.7 F (36.5 C)  TempSrc: Oral Oral Axillary Axillary  SpO2: 95% 93% 95% 98%    Intake/Output Summary (Last 24 hours) at 01/13/2021 1403 Last data filed at 01/13/2021 1351 Gross per 24 hour  Intake 394.39 ml  Output 1300 ml  Net -905.61 ml   There were no vitals filed for this visit.  Examination:    General: Appearance:    Obese female who is confused     Lungs:     On [place], respirations unlabored  Heart:    Normal heart rate.   MS:   All extremities are intact. +LE edema  Neurologic:   Awake, hard of hearing-- will answer questions appropriately when able to hear    Data Reviewed: I have independently reviewed following labs and imaging studies   CBC: Recent Labs  Lab 01/10/21 0417 01/11/21 0313 01/12/21 0620 01/13/21 0705  WBC 20.1* 15.0* 11.3* 10.6*  NEUTROABS 17.2*  --   --   --   HGB 13.2 11.9* 12.3 13.1  HCT 40.9 38.3 40.4 41.1  MCV 97.8 101.6* 103.1* 100.0  PLT 135* 115* 109* 244*   Basic Metabolic Panel: Recent Labs  Lab 01/10/21 0417 01/11/21 0313 01/12/21 0620 01/13/21 0705  NA 136 135 134* 136  K 3.7 4.5 4.5 4.1  CL 101 101 99 100  CO2 24 26 27 27   GLUCOSE 175* 150* 158* 119*  BUN 24* 30* 35* 49*  CREATININE 1.32* 1.44* 1.60* 1.56*  CALCIUM 8.0* 8.0* 8.6* 8.7*  MG 1.5*  --   --  2.3   Liver Function Tests: Recent Labs  Lab 01/10/21 0417 01/12/21 0620  AST 24 25  ALT 17 24  ALKPHOS 113 107  BILITOT 0.8 0.5  PROT 5.4* 5.8*  ALBUMIN 2.5* 2.3*   Coagulation Profile: Recent Labs  Lab 01/10/21 0417  INR 1.2   HbA1C: Recent Labs    01/10/21 1822  HGBA1C 7.4*   CBG: Recent Labs  Lab 01/12/21 1120 01/12/21 1617 01/12/21 2156 01/13/21 0620 01/13/21 1151  GLUCAP 119* 131* 109* 109* 136*    Recent Results (from the past 240 hour(s))  Blood culture (routine single)     Status: Abnormal   Collection Time: 01/10/21  4:07 AM   Specimen: BLOOD  Result Value Ref Range  Status   Specimen Description BLOOD RIGHT ANTECUBITAL  Final   Special Requests   Final    BOTTLES DRAWN AEROBIC AND ANAEROBIC Blood Culture adequate volume   Culture  Setup Time   Final    GRAM NEGATIVE RODS IN BOTH AEROBIC AND ANAEROBIC BOTTLES CRITICAL RESULT CALLED TO, READ BACK BY AND VERIFIED WITH: PHARM D A.MAYER ON [id number] AT [place] Performed at [facility], [place] [address], [place], [place] [address]    Culture ESCHERICHIA COLI (A)  Final   Report Status 01/12/2021 FINAL  Final   Organism ID, Bacteria ESCHERICHIA COLI  Final  Susceptibility   Escherichia coli - MIC*    AMPICILLIN >=32 RESISTANT Resistant     CEFAZOLIN <=4 SENSITIVE Sensitive     CEFEPIME <=0.12 SENSITIVE Sensitive     CEFTAZIDIME <=1 SENSITIVE Sensitive     CEFTRIAXONE <=0.25 SENSITIVE Sensitive     CIPROFLOXACIN <=0.25 SENSITIVE Sensitive     GENTAMICIN <=1 SENSITIVE Sensitive     IMIPENEM <=0.25 SENSITIVE Sensitive     TRIMETH/SULFA >=320 RESISTANT Resistant     AMPICILLIN/SULBACTAM 8 SENSITIVE Sensitive     PIP/TAZO <=4 SENSITIVE Sensitive     * ESCHERICHIA COLI  Blood Culture ID Panel (Reflexed)     Status: Abnormal   Collection Time: 01/10/21  4:07 AM  Result Value Ref Range Status   Enterococcus faecalis NOT DETECTED NOT DETECTED Final   Enterococcus Faecium NOT DETECTED NOT DETECTED Final   Listeria monocytogenes NOT DETECTED NOT DETECTED Final   Staphylococcus species NOT DETECTED NOT DETECTED Final   Staphylococcus aureus (BCID) NOT DETECTED NOT DETECTED Final   Staphylococcus epidermidis NOT DETECTED NOT DETECTED Final   Staphylococcus lugdunensis NOT DETECTED NOT DETECTED Final   Streptococcus species NOT DETECTED NOT DETECTED Final   Streptococcus agalactiae NOT DETECTED NOT DETECTED Final   Streptococcus pneumoniae NOT DETECTED NOT DETECTED Final   Streptococcus pyogenes NOT DETECTED NOT DETECTED Final   A.calcoaceticus-baumannii NOT DETECTED NOT DETECTED Final    Bacteroides fragilis NOT DETECTED NOT DETECTED Final   Enterobacterales DETECTED (A) NOT DETECTED Final    Comment: Enterobacterales represent a large order of gram negative bacteria, not a single organism. CRITICAL RESULT CALLED TO, READ BACK BY AND VERIFIED WITH: PHARM D A.MAYER ON [id number] AT 1957 BY E.PARRISH    Enterobacter cloacae complex NOT DETECTED NOT DETECTED Final   Escherichia coli DETECTED (A) NOT DETECTED Final    Comment: CRITICAL RESULT CALLED TO, READ BACK BY AND VERIFIED WITH: PHARM D A.MAYER ON [id number] AT 1957 BY E.PARRISH    Klebsiella aerogenes NOT DETECTED NOT DETECTED Final   Klebsiella oxytoca NOT DETECTED NOT DETECTED Final   Klebsiella pneumoniae NOT DETECTED NOT DETECTED Final   Proteus species NOT DETECTED NOT DETECTED Final   Salmonella species NOT DETECTED NOT DETECTED Final   Serratia marcescens NOT DETECTED NOT DETECTED Final   Haemophilus influenzae NOT DETECTED NOT DETECTED Final   Neisseria meningitidis NOT DETECTED NOT DETECTED Final   Pseudomonas aeruginosa NOT DETECTED NOT DETECTED Final   Stenotrophomonas maltophilia NOT DETECTED NOT DETECTED Final   Candida albicans NOT DETECTED NOT DETECTED Final   Candida auris NOT DETECTED NOT DETECTED Final   Candida glabrata NOT DETECTED NOT DETECTED Final   Candida krusei NOT DETECTED NOT DETECTED Final   Candida parapsilosis NOT DETECTED NOT DETECTED Final   Candida tropicalis NOT DETECTED NOT DETECTED Final   Cryptococcus neoformans/gattii NOT DETECTED NOT DETECTED Final   CTX-M ESBL NOT DETECTED NOT DETECTED Final   Carbapenem resistance IMP NOT DETECTED NOT DETECTED Final   Carbapenem resistance KPC NOT DETECTED NOT DETECTED Final   Carbapenem resistance NDM NOT DETECTED NOT DETECTED Final   Carbapenem resist OXA 48 LIKE NOT DETECTED NOT DETECTED Final   Carbapenem resistance VIM NOT DETECTED NOT DETECTED Final    Comment: Performed at [facility], [address], [place], [place] [address]   Resp Panel by RT-PCR (Flu A&B, Covid) Nasopharyngeal Swab     Status: None   Collection Time: 01/10/21  4:15 AM   Specimen: Nasopharyngeal Swab; Nasopharyngeal(NP) swabs in vial transport medium  Result Value Ref Range Status   SARS Coronavirus 2 by RT PCR NEGATIVE NEGATIVE Final    Comment: (NOTE) SARS-CoV-2 target nucleic acids are NOT DETECTED.  The SARS-CoV-2 RNA is generally detectable in upper respiratory specimens during the acute phase of infection. The lowest concentration of SARS-CoV-2 viral copies this assay can detect is 138 copies/mL. A negative result does not preclude SARS-Cov-2 infection and should not be used as the sole basis for treatment or other patient management decisions. A negative result may occur with  improper specimen collection/handling, submission of specimen other than nasopharyngeal swab, presence of viral mutation(s) within the areas targeted by this assay, and inadequate number of viral copies(<138 copies/mL). A negative result must be combined with clinical observations, patient history, and epidemiological information. The expected result is Negative.  Fact Sheet for Patients:  EntrepreneurPulse.com.au  Fact Sheet for Healthcare Providers:  IncredibleEmployment.be  This test is no t yet approved or cleared by the [place] FDA and  has been authorized for detection and/or diagnosis of SARS-CoV-2 by FDA under an Emergency Use Authorization (EUA). This EUA will remain  in effect (meaning this test can be used) for the duration of the COVID-19 declaration under Section 564(b)(1) of the Act, 21 U.S.C.section 360bbb-3(b)(1), unless the authorization is terminated  or revoked sooner.       Influenza A by PCR NEGATIVE NEGATIVE Final   Influenza B by PCR NEGATIVE NEGATIVE Final    Comment: (NOTE) The Xpert Xpress SARS-CoV-2/FLU/RSV plus assay is intended as an aid in the diagnosis of influenza from  Nasopharyngeal swab specimens and should not be used as a sole basis for treatment. Nasal washings and aspirates are unacceptable for Xpert Xpress SARS-CoV-2/FLU/RSV testing.  Fact Sheet for Patients: EntrepreneurPulse.com.au  Fact Sheet for Healthcare Providers: IncredibleEmployment.be  This test is not yet approved or cleared by the [place] FDA and has been authorized for detection and/or diagnosis of SARS-CoV-2 by FDA under an Emergency Use Authorization (EUA). This EUA will remain in effect (meaning this test can be used) for the duration of the COVID-19 declaration under Section 564(b)(1) of the Act, 21 U.S.C. section 360bbb-3(b)(1), unless the authorization is terminated or revoked.  Performed at [facility], [place] [address], [place], [place] [address]      Radiology Studies: CT HEAD WO CONTRAST  Result Date: 01/13/2021 CLINICAL DATA:  Headache EXAM: CT HEAD WITHOUT CONTRAST TECHNIQUE: Contiguous axial images were obtained from the base of the skull through the vertex without intravenous contrast. COMPARISON:  12/20/2020 FINDINGS: Brain: There is no acute intracranial hemorrhage, mass effect, or edema. Gray-white differentiation is preserved. There is no extra-axial fluid collection. Ventricles and sulci are stable in size and configuration. Vascular: There is atherosclerotic calcification at the skull base. Skull: Calvarium is unremarkable. Sinuses/Orbits: No acute finding. Other: None. IMPRESSION: No acute intracranial abnormality or significant change since recent prior study. Electronically Signed   By: Macy Mis M.D.   On: 01/13/2021 13:09    Eulogio Bear DO [facility]  Between 7 am - 7 pm I am available, please contact me via Amion (for emergencies) or Securechat (non urgent messages)  Between 7 pm - 7 am I am not available, please contact night coverage MD/APP via Amion

## 2021-01-13 NOTE — Progress Notes (Signed)
Physical Therapy Treatment Patient Details Name: Deanna Salinas MRN: [id number] DOB: [birth year] Today's Date: 01/13/2021    History of Present Illness 85 year old female who presented to ED on 6/4 due to chest pain, fever and shortness of breath. In the ED she was found to be hypoxic with sats in the mid 80s as well as left-sided lobar pneumonia with a trace left-sided pleural effusion. Pt diagnosed with sepsis. Pt also with recent fall and L rib fractures. PMH: HTN, HLD, DM2    PT Comments    Continuing work on functional mobility and activity tolerance;  Very nice progress, with notable excellent improvements in O2 sats response to activity, and able to bring supplemental O2 back down to 2 L, which is her baseline; Husband and daughter were present during session, and pt showed good participation, with occasional need to be redirected back to task at hand; mod assist of 2 to stand and take pivot steps to RW;   Discussed plns for dc with Miranda, LCSW and daughter -- they agree with SNF for short-term rehab, and would like to find a SNF in or near [place]; they are also interested in driving pt to SNF to avoid the expense of an ambulance ride -- added a goal aimed at car transers  Follow Up Recommendations  SNF;Supervision/Assistance - 24 hour     Equipment Recommendations  Wheelchair cushion (measurements PT);Wheelchair (measurements PT)    Recommendations for Other Services       Precautions / Restrictions Precautions Precautions: Fall;Other (comment) Precaution Comments: watch HR, O2, previous L rib fx    Mobility  Bed Mobility Overal bed mobility: Needs Assistance Bed Mobility: Supine to Sit     Supine to sit: Min assist;HOB elevated     General bed mobility comments: Min A to advance trunk to EOB    Transfers Overall transfer level: Needs assistance Equipment used: Rolling walker (2 wheeled) Transfers: Sit to/from Stand Sit to Stand: Mod assist;+2 physical assistance          General transfer comment: Heavy mod assist to rise; tends to pull up on RW, so it needs to be stabilized as well; During transfer to stand, pt called out to her husband to "push my butt up" -- PT and tech then used bed pad to help her fully extend hips  Ambulation/Gait Ambulation/Gait assistance: Mod assist;+2 physical assistance;+2 safety/equipment Gait Distance (Feet):  (short, small pivot steps bed to recliner) Assistive device: Rolling walker (2 wheeled) Gait Pattern/deviations: Shuffle     General Gait Details: Multomodal cueing to weight shift and take steps; very good job waiting to sit until cues that she was in good position   Marine scientist Rankin (Stroke Patients Only)       Balance                                            Cognition Arousal/Alertness: Awake/alert Behavior During Therapy: WFL for tasks assessed/performed Overall Cognitive Status: Impaired/Different from baseline Area of Impairment: Orientation;Attention;Memory;Following commands;Safety/judgement;Awareness;Problem solving                 Orientation Level: Disoriented to;Place;Time;Situation     Following Commands: Follows one step commands consistently;Follows one step commands with increased time       General Comments: Smiling and  participating well; easily redirected back to task at hand      Exercises      General Comments General comments (skin integrity, edema, etc.): Session conducted on 2L O2 (which is her baseline at home); O2 sats remained in the upper 90s; HR with normal response to activity      Pertinent Vitals/Pain Pain Assessment: Faces Faces Pain Scale: Hurts a little bit Pain Location: L ribs Pain Descriptors / Indicators: Grimacing Pain Intervention(s): Monitored during session    Home Living                      Prior Function            PT Goals (current goals can now be  found in the care plan section) Acute Rehab PT Goals Patient Stated Goal: wants to get out of the bed PT Goal Formulation: With patient Time For Goal Achievement: 01/25/21 Potential to Achieve Goals: Fair Additional Goals Additional Goal #1: Family will verbalize and show ability to problem-solve and guide/instruct pt in performing safe car transfer to small SUV-type car Progress towards PT goals: Progressing toward goals    Frequency    Min 2X/week      PT Plan Current plan remains appropriate    Co-evaluation              AM-PAC PT "6 Clicks" Mobility   Outcome Measure  Help needed turning from your back to your side while in a flat bed without using bedrails?: A Little Help needed moving from lying on your back to sitting on the side of a flat bed without using bedrails?: A Lot Help needed moving to and from a bed to a chair (including a wheelchair)?: A Lot Help needed standing up from a chair using your arms (e.g., wheelchair or bedside chair)?: A Lot Help needed to walk in hospital room?: A Lot Help needed climbing 3-5 steps with a railing? : Total 6 Click Score: 12    End of Session Equipment Utilized During Treatment: Gait belt;Oxygen Activity Tolerance: Patient tolerated treatment well Patient left: in chair;with call bell/phone within reach;with chair alarm set;with family/visitor present Nurse Communication: Mobility status PT Visit Diagnosis: Unsteadiness on feet (R26.81);Muscle weakness (generalized) (M62.81);History of falling (Z91.81);Difficulty in walking, not elsewhere classified (R26.2)     Time: 1000-1030 PT Time Calculation (min) (ACUTE ONLY): 30 min  Charges:  $Gait Training: 8-22 mins $Therapeutic Activity: 8-22 mins                     Roney Marion, PT  Acute Rehabilitation Services Pager [phone] Office [phone]    Deanna Salinas 01/13/2021, 2:05 PM

## 2021-01-13 NOTE — Progress Notes (Addendum)
Dr. Eliseo Squires notified about patient's increased confusion. Tele sitter ordered and in place. Will continue to monitor. Also informed her of patient having increased wheezing. Soul-mederol and lasix ordered and CXR. Will also continue to monitor.

## 2021-01-13 NOTE — TOC Progression Note (Signed)
Transition of Care [facility]) - Progression Note    Patient Details  Name: Deanna Salinas MRN: [id number] Date of Birth: 1935/10/09  Transition of Care [facility]) CM/SW [place], [place] Phone Number: 01/13/2021, 4:33 PM  Clinical Narrative:     CSW spoke to pts husband and daughter carla at bedside. CSW gave carla bed offers and medicare.goc rating scale. Angela Nevin stated to call [facility] about choice. CSW spoke to [place] by phone, who stated they decided that if pt cant get into [facility] or [facility] they would like to look at facilities in the [place] area. [facility] gave [place] names of four facilities.   1. [facility]: [phone], fax [phone], referral sent to [facility] 2. [place]: [place], referral sent to [facility] 3. [facility]: [phone], CSW unable to send referral without fax number. Left message for admission coordinator to call back with fax number.  4. [facility] has no beds this week, referral not sent  TOC will follow.  Expected Discharge Plan: [place] Barriers to Discharge: Continued Medical Work up  Expected Discharge Plan and Services Expected Discharge Plan: [place] arrangements for the past 2 months: Single Family Home                                       Social Determinants of Health (SDOH) Interventions    Readmission Risk Interventions No flowsheet data found.  Emeterio Reeve, Latanya Presser, [place] Social Worker [phone]

## 2021-01-13 NOTE — Progress Notes (Signed)
  Speech Language Pathology Treatment: Dysphagia  Patient Details Name: Deanna Salinas MRN: [id number] DOB: August 22, 1935 Today's Date: 01/13/2021 Time: 3343-5686 SLP Time Calculation (min) (ACUTE ONLY): 12 min  Assessment / Plan / Recommendation Clinical Impression  F/u after yesterday's swallow assessment.  Confusion persists; husband and dtr at bedside.  Reviewed precautions for thickened liquids with pt's dtr.  Pt consumed multiple sips of nectar liquid with no coughing, continues to cough consistently with thin liquids. Requires min verbal cues for pacing.  CT head is pending - continue current diet with modified liquids. If there are no improvements, will investigate MBS to determine pathophysiology.    HPI HPI: 85 year old female who presented to ED on 6/4 due to chest pain, fever and shortness of breath. In the ED she was found to be hypoxic with sats in the mid 80s as well as left-sided lobar pneumonia with a trace left-sided pleural effusion. Pt diagnosed with sepsis, acute metabolic encephalopathy. Pt also with recent fall and L rib fractures. PMH: HTN, HLD, DM2      SLP Plan  Continue with current plan of care       Recommendations  Diet recommendations: Regular;Nectar-thick liquid Liquids provided via: Cup Medication Administration: Whole meds with puree Supervision: Staff to assist with self feeding Compensations: Minimize environmental distractions Postural Changes and/or Swallow Maneuvers: Seated upright 90 degrees                Oral Care Recommendations: Oral care BID Follow up Recommendations: None SLP Visit Diagnosis: Dysphagia, unspecified (R13.10) Plan: Continue with current plan of care       GO              Bill Mcvey L. Tivis Ringer, [place] CCC/SLP Acute Rehabilitation Services Office number [phone] Pager [phone]   Deanna Salinas 01/13/2021, 12:03 PM

## 2021-01-13 NOTE — Consult Note (Signed)
[place] for Infectious Disease    Date of Admission:  01/10/2021     Total days of antibiotics 4               Reason for Consult: E. Coli Bacteremia   Referring Provider: Tivis Ringer  Primary Care Provider: Kathyrn Drown, MD   ASSESSMENT:  Deanna Salinas is an 85 y/o female with recent mechanical fall on 5/14 resulting in left sided rib fracture admitted with chest pain, shortness of breath and hypoxia and complicated by encephalopathy and E. Coli bacteremia. Source of bacteremia is unclear although daughter presents possibility with recent diaper like rash. Chest pain likely associated with recent fall resulting in atelectasis due to pain and decreased mobility. Certainly pneumonia is a possibility. Origin of encephalopathy is unclear with differentials including pneumonia, uremia, or possibly medication related. Will change antibiotics to Unasyn for now. Continue oxygen supplementation per primary team. Will follow.   PLAN:  1. Change antibiotics to Unasyn.  2. Pulmonary hygiene and oxygen supplementation per primary team.  3. Limit medications that sedate/alter metnation   Principal Problem:   Sepsis due to pneumonia [facility]) Active Problems:   Hyperlipidemia   Essential hypertension   Chronic insomnia   Type 2 diabetes mellitus (HCC)   Chest pain of uncertain etiology   Rib fractures   . docusate sodium  100 mg Oral BID  . enoxaparin (LOVENOX) injection  40 mg Subcutaneous Q24H  . insulin aspart  0-15 Units Subcutaneous TID WC  . insulin aspart  0-5 Units Subcutaneous QHS  . lidocaine  1 patch Transdermal Q24H  . nortriptyline  10 mg Oral QHS  . temazepam  30 mg Oral QHS     HPI: Deanna Salinas is a 85 y.o. female with previous medical history of Type 2 diabetes, hyperlipidemia, and essential hypertension admitted with shortness of breath and chest pain.   Deanna Salinas began having shortness of breath and chest pain the night prior to presentation. Had a  mechanical fall on 5/14 and seen in the ED with x-rays showing a non-displaced sixth rib fracture. Has been having a cough and chest pain that radiated to the left shoulder. Chest x-ray on admission with interval development of patchy airspace opacity at the left base with question of trace left pleural effusion and several age indeterminate displaced left rib fractures involving at least the posterior 4th, 5th and seventh ribs. Afebrile on admission with leukocytosis and WBC count of 20.1. Found to have hypoxia in the mid 80's. Started on azithromycin and ceftriaxone with concern for left-sided lobar pneumonia. .   Deanna Salinas has been afebrile since admission with leukocytosis down to 10.6. Blood culture is positive for E. Coli (R - ampicillin and Bactrim). She continues to have encephalopathy and hypoxia when off oxygen desaturating to 78-85% on room air. Daughter added that she has had some skin breakdown/diaper like rash secondary to the need to wear a diaper because of decreased mobility. She is confused today and history is provided by the chart and Deanna Salinas's family.   Review of Systems: Review of Systems  Unable to perform ROS: Mental status change     Past Medical History:  Diagnosis Date  . Essential hypertension   . Fatty liver   . Hyperlipidemia   . Type 2 diabetes mellitus (HCC)     Social History   Tobacco Use  . Smoking status: Never Smoker  . Smokeless tobacco: Never Used  Vaping Use  .  Vaping Use: Never used  Substance Use Topics  . Alcohol use: Yes    Alcohol/week: 3.0 standard drinks    Types: 3 Glasses of wine per week    Comment: occasional  . Drug use: No    Family History  Problem Relation Age of Onset  . Heart Problems Father   . Hypertension Other   . Hypertension Other   . Cancer Mother   . Cancer Daughter        breast   . Colon cancer Neg Hx     Allergies  Allergen Reactions  . Ambien [Zolpidem Tartrate]     Side effects   . Lipitor  [Atorvastatin]     Muscle and joint pain  . Metformin And Related     Reported side effects headaches insomnia nausea  . Pravastatin     Muscle and joint pain  . Trazodone And Nefazodone     Side effects    OBJECTIVE: Blood pressure (!) 145/92, pulse 100, temperature 97.7 F (36.5 C), temperature source Axillary, resp. rate 18, SpO2 98 %.  Physical Exam Constitutional:      General: She is not in acute distress.    Appearance: She is well-developed.     Comments: Lying in bed with head of bed elevated; pleasantly confused.   Cardiovascular:     Rate and Rhythm: Normal rate and regular rhythm.     Heart sounds: Normal heart sounds.  Pulmonary:     Effort: Pulmonary effort is normal.     Breath sounds: Wheezing present.  Abdominal:     General: Bowel sounds are normal.  Skin:    General: Skin is warm and dry.  Neurological:     Mental Status: She is alert. She is disoriented.     Comments: Oriented to person and knows she is in the hospital     Lab Results Lab Results  Component Value Date   WBC 10.6 (H) 01/13/2021   HGB 13.1 01/13/2021   HCT 41.1 01/13/2021   MCV 100.0 01/13/2021   PLT 142 (L) 01/13/2021    Lab Results  Component Value Date   CREATININE 1.56 (H) 01/13/2021   BUN 49 (H) 01/13/2021   NA 136 01/13/2021   K 4.1 01/13/2021   CL 100 01/13/2021   CO2 27 01/13/2021    Lab Results  Component Value Date   ALT 24 01/12/2021   AST 25 01/12/2021   ALKPHOS 107 01/12/2021   BILITOT 0.5 01/12/2021     Microbiology: Recent Results (from the past 240 hour(s))  Blood culture (routine single)     Status: Abnormal   Collection Time: 01/10/21  4:07 AM   Specimen: BLOOD  Result Value Ref Range Status   Specimen Description BLOOD RIGHT ANTECUBITAL  Final   Special Requests   Final    BOTTLES DRAWN AEROBIC AND ANAEROBIC Blood Culture adequate volume   Culture  Setup Time   Final    GRAM NEGATIVE RODS IN BOTH AEROBIC AND ANAEROBIC BOTTLES CRITICAL RESULT  CALLED TO, READ BACK BY AND VERIFIED WITH: PHARM D A.MAYER ON [id number] AT [place] Performed at [facility], [place] [address], [place], [place] [address]    Culture ESCHERICHIA COLI (A)  Final   Report Status 01/12/2021 FINAL  Final   Organism ID, Bacteria ESCHERICHIA COLI  Final      Susceptibility   Escherichia coli - MIC*    AMPICILLIN >=32 RESISTANT Resistant     CEFAZOLIN <=4  SENSITIVE Sensitive     CEFEPIME <=0.12 SENSITIVE Sensitive     CEFTAZIDIME <=1 SENSITIVE Sensitive     CEFTRIAXONE <=0.25 SENSITIVE Sensitive     CIPROFLOXACIN <=0.25 SENSITIVE Sensitive     GENTAMICIN <=1 SENSITIVE Sensitive     IMIPENEM <=0.25 SENSITIVE Sensitive     TRIMETH/SULFA >=320 RESISTANT Resistant     AMPICILLIN/SULBACTAM 8 SENSITIVE Sensitive     PIP/TAZO <=4 SENSITIVE Sensitive     * ESCHERICHIA COLI  Blood Culture ID Panel (Reflexed)     Status: Abnormal   Collection Time: 01/10/21  4:07 AM  Result Value Ref Range Status   Enterococcus faecalis NOT DETECTED NOT DETECTED Final   Enterococcus Faecium NOT DETECTED NOT DETECTED Final   Listeria monocytogenes NOT DETECTED NOT DETECTED Final   Staphylococcus species NOT DETECTED NOT DETECTED Final   Staphylococcus aureus (BCID) NOT DETECTED NOT DETECTED Final   Staphylococcus epidermidis NOT DETECTED NOT DETECTED Final   Staphylococcus lugdunensis NOT DETECTED NOT DETECTED Final   Streptococcus species NOT DETECTED NOT DETECTED Final   Streptococcus agalactiae NOT DETECTED NOT DETECTED Final   Streptococcus pneumoniae NOT DETECTED NOT DETECTED Final   Streptococcus pyogenes NOT DETECTED NOT DETECTED Final   A.calcoaceticus-baumannii NOT DETECTED NOT DETECTED Final   Bacteroides fragilis NOT DETECTED NOT DETECTED Final   Enterobacterales DETECTED (A) NOT DETECTED Final    Comment: Enterobacterales represent a large order of gram negative bacteria, not a single organism. CRITICAL RESULT CALLED TO, READ BACK BY AND VERIFIED  WITH: PHARM D A.MAYER ON [id number] AT 1957 BY E.PARRISH    Enterobacter cloacae complex NOT DETECTED NOT DETECTED Final   Escherichia coli DETECTED (A) NOT DETECTED Final    Comment: CRITICAL RESULT CALLED TO, READ BACK BY AND VERIFIED WITH: PHARM D A.MAYER ON [id number] AT 1957 BY E.PARRISH    Klebsiella aerogenes NOT DETECTED NOT DETECTED Final   Klebsiella oxytoca NOT DETECTED NOT DETECTED Final   Klebsiella pneumoniae NOT DETECTED NOT DETECTED Final   Proteus species NOT DETECTED NOT DETECTED Final   Salmonella species NOT DETECTED NOT DETECTED Final   Serratia marcescens NOT DETECTED NOT DETECTED Final   Haemophilus influenzae NOT DETECTED NOT DETECTED Final   Neisseria meningitidis NOT DETECTED NOT DETECTED Final   Pseudomonas aeruginosa NOT DETECTED NOT DETECTED Final   Stenotrophomonas maltophilia NOT DETECTED NOT DETECTED Final   Candida albicans NOT DETECTED NOT DETECTED Final   Candida auris NOT DETECTED NOT DETECTED Final   Candida glabrata NOT DETECTED NOT DETECTED Final   Candida krusei NOT DETECTED NOT DETECTED Final   Candida parapsilosis NOT DETECTED NOT DETECTED Final   Candida tropicalis NOT DETECTED NOT DETECTED Final   Cryptococcus neoformans/gattii NOT DETECTED NOT DETECTED Final   CTX-M ESBL NOT DETECTED NOT DETECTED Final   Carbapenem resistance IMP NOT DETECTED NOT DETECTED Final   Carbapenem resistance KPC NOT DETECTED NOT DETECTED Final   Carbapenem resistance NDM NOT DETECTED NOT DETECTED Final   Carbapenem resist OXA 48 LIKE NOT DETECTED NOT DETECTED Final   Carbapenem resistance VIM NOT DETECTED NOT DETECTED Final    Comment: Performed at [facility], [address], [place], [place] [address]  Resp Panel by RT-PCR (Flu A&B, Covid) Nasopharyngeal Swab     Status: None   Collection Time: 01/10/21  4:15 AM   Specimen: Nasopharyngeal Swab; Nasopharyngeal(NP) swabs in vial transport medium  Result Value Ref Range Status   SARS Coronavirus 2 by RT PCR  NEGATIVE NEGATIVE Final  Comment: (NOTE) SARS-CoV-2 target nucleic acids are NOT DETECTED.  The SARS-CoV-2 RNA is generally detectable in upper respiratory specimens during the acute phase of infection. The lowest concentration of SARS-CoV-2 viral copies this assay can detect is 138 copies/mL. A negative result does not preclude SARS-Cov-2 infection and should not be used as the sole basis for treatment or other patient management decisions. A negative result may occur with  improper specimen collection/handling, submission of specimen other than nasopharyngeal swab, presence of viral mutation(s) within the areas targeted by this assay, and inadequate number of viral copies(<138 copies/mL). A negative result must be combined with clinical observations, patient history, and epidemiological information. The expected result is Negative.  Fact Sheet for Patients:  EntrepreneurPulse.com.au  Fact Sheet for Healthcare Providers:  IncredibleEmployment.be  This test is no t yet approved or cleared by the [place] FDA and  has been authorized for detection and/or diagnosis of SARS-CoV-2 by FDA under an Emergency Use Authorization (EUA). This EUA will remain  in effect (meaning this test can be used) for the duration of the COVID-19 declaration under Section 564(b)(1) of the Act, 21 U.S.C.section 360bbb-3(b)(1), unless the authorization is terminated  or revoked sooner.       Influenza A by PCR NEGATIVE NEGATIVE Final   Influenza B by PCR NEGATIVE NEGATIVE Final    Comment: (NOTE) The Xpert Xpress SARS-CoV-2/FLU/RSV plus assay is intended as an aid in the diagnosis of influenza from Nasopharyngeal swab specimens and should not be used as a sole basis for treatment. Nasal washings and aspirates are unacceptable for Xpert Xpress SARS-CoV-2/FLU/RSV testing.  Fact Sheet for Patients: EntrepreneurPulse.com.au  Fact Sheet for  Healthcare Providers: IncredibleEmployment.be  This test is not yet approved or cleared by the [place] FDA and has been authorized for detection and/or diagnosis of SARS-CoV-2 by FDA under an Emergency Use Authorization (EUA). This EUA will remain in effect (meaning this test can be used) for the duration of the COVID-19 declaration under Section 564(b)(1) of the Act, 21 U.S.C. section 360bbb-3(b)(1), unless the authorization is terminated or revoked.  Performed at [facility], [place] [address] [place], [place] [address]      Terri Piedra, [place] for Infectious Disease [facility]  01/13/2021  10:23 AM

## 2021-01-14 ENCOUNTER — Inpatient Hospital Stay (HOSPITAL_COMMUNITY): Payer: Medicare Other

## 2021-01-14 DIAGNOSIS — R7881 Bacteremia: Secondary | ICD-10-CM

## 2021-01-14 DIAGNOSIS — S2242XD Multiple fractures of ribs, left side, subsequent encounter for fracture with routine healing: Secondary | ICD-10-CM

## 2021-01-14 DIAGNOSIS — B9620 Unspecified Escherichia coli [E. coli] as the cause of diseases classified elsewhere: Secondary | ICD-10-CM

## 2021-01-14 DIAGNOSIS — R0609 Other forms of dyspnea: Secondary | ICD-10-CM

## 2021-01-14 DIAGNOSIS — G4700 Insomnia, unspecified: Secondary | ICD-10-CM

## 2021-01-14 DIAGNOSIS — G9341 Metabolic encephalopathy: Secondary | ICD-10-CM

## 2021-01-14 LAB — BASIC METABOLIC PANEL
Anion gap: 12 (ref 5–15)
BUN: 41 mg/dL — ABNORMAL HIGH (ref 8–23)
CO2: 32 mmol/L (ref 22–32)
Calcium: 8.5 mg/dL — ABNORMAL LOW (ref 8.9–10.3)
Chloride: 96 mmol/L — ABNORMAL LOW (ref 98–111)
Creatinine, Ser: 1.3 mg/dL — ABNORMAL HIGH (ref 0.44–1.00)
GFR, Estimated: 41 mL/min — ABNORMAL LOW (ref >60)
Glucose, Bld: 180 mg/dL — ABNORMAL HIGH (ref 70–99)
Potassium: 4.3 mmol/L (ref 3.5–5.1)
Sodium: 140 mmol/L (ref 135–145)

## 2021-01-14 LAB — CBC
HCT: 43.8 % (ref 36.0–46.0)
Hemoglobin: 14.2 g/dL (ref 12.0–15.0)
MCH: 31.5 pg (ref 26.0–34.0)
MCHC: 32.4 g/dL (ref 30.0–36.0)
MCV: 97.1 fL (ref 80.0–100.0)
Platelets: 187 10*3/uL (ref 150–400)
RBC: 4.51 MIL/uL (ref 3.87–5.11)
RDW: 13.8 % (ref 11.5–15.5)
WBC: 6.2 10*3/uL (ref 4.0–10.5)
nRBC: 0 % (ref 0.0–0.2)

## 2021-01-14 LAB — AMMONIA: Ammonia: 23 umol/L (ref 9–35)

## 2021-01-14 LAB — GLUCOSE, CAPILLARY
Glucose-Capillary: 164 mg/dL — ABNORMAL HIGH (ref 70–99)
Glucose-Capillary: 162 mg/dL — ABNORMAL HIGH (ref 70–99)
Glucose-Capillary: 151 mg/dL — ABNORMAL HIGH (ref 70–99)
Glucose-Capillary: 143 mg/dL — ABNORMAL HIGH (ref 70–99)

## 2021-01-14 LAB — ECHOCARDIOGRAM COMPLETE
Area-P 1/2: 2.19 cm2
S' Lateral: 3.7 cm

## 2021-01-14 MED ORDER — TORSEMIDE 20 MG PO TABS
20.0000 mg | ORAL_TABLET | Freq: Every day | ORAL | Status: DC
  Administered 2021-01-14 – 2021-01-19 (×6): 20 mg via ORAL
  Filled 2021-01-14 (×6): qty 1

## 2021-01-14 MED ORDER — QUETIAPINE FUMARATE 25 MG PO TABS
12.5000 mg | ORAL_TABLET | Freq: Every day | ORAL | Status: DC
  Administered 2021-01-14 – 2021-01-18 (×5): 12.5 mg via ORAL
  Filled 2021-01-14 (×5): qty 1

## 2021-01-14 MED ORDER — AMLODIPINE BESYLATE 2.5 MG PO TABS
2.5000 mg | ORAL_TABLET | Freq: Every day | ORAL | Status: DC
  Administered 2021-01-14 – 2021-01-19 (×6): 2.5 mg via ORAL
  Filled 2021-01-14 (×6): qty 1

## 2021-01-14 NOTE — Progress Notes (Signed)
PROGRESS NOTE    [place]  EGB:[id number] DOB: 1935-12-30 DOA: 01/10/2021 PCP: Kathyrn Drown, MD (Confirm with patient/family/NH records and if not entered, this HAS to be entered at [facility] point of entry. "No PCP" if truly none.)   No chief complaint on file.   Brief Narrative:  85 year old female with history of HTN, HLD, DM2 comes to the hospital with chest pain and shortness of breath.  She apparently recently had a fall and had a fractured rib.  She has been having fever and chills for the past 4 days.  In the ED she was found to be hypoxic with sats in the mid 80s as well as left-sided lobar pneumonia with a trace left-sided pleural effusion.  She was placed on antibiotics and admitted to the hospital.  Found to have bacteremia as well.  Also has remained confused at times    Assessment & Plan:   Principal Problem:   Sepsis due to pneumonia [facility]) Active Problems:   Hyperlipidemia   Essential hypertension   Chronic insomnia   Type 2 diabetes mellitus (HCC)   Chest pain of uncertain etiology   Rib fractures   Acute metabolic encephalopathy   E coli bacteremia   #1 sepsis secondary to E. coli bacteremia/lobar pneumonia -Patient pancultured blood cultures positive for E. Coli. -Chest x-ray concerning for pneumonia. -Currently afebrile. -Leukocytosis trending down. -Was on IV Rocephin and subsequently transition to IV Unasyn per ID recommendations -Supportive care.  2.  Acute hypoxic respiratory failure secondary to pneumonia -Noted to have required 2 L nasal cannula. -Afebrile. -Currently on 3 L nasal cannula with sats of 94%. -Continue empiric IV antibiotics of Unasyn. -Supportive care.  3.  Acute metabolic encephalopathy -Likely secondary to problem #1. -Patient noted to be agitated overnight and given IV Ativan due to also concern for possible alcohol withdrawal. -Head CT negative. -Ammonia levels within normal limits at 23. -Continue empiric IV  antibiotics. -Avoid medications that can alter sensorium. -Limit narcotics. -Placed on Seroquel nightly.  4.  Chest pain with elevated troponin -Likely secondary to left lower lobe pneumonia left-sided rib fractures. -Likely musculoskeletal in nature in addition. -Troponins flattened. -2D echo with EF of 60 to 65%,NWMA, mild LVH, grade 1DD.  5.  Acute kidney injury -Felt likely component of volume overload. -Renal function improving with diuresis. -Resume home regimen Demadex. -Outpatient follow-up.  6.  Left rib fractures -Noted on chest x-ray on presentation which include at least posterior fourth, fifth, seventh ribs -Rib fracture likely etiology of left-sided chest pain. -Continue lidocaine patch. -Minimize narcotics due to acute encephalopathy. -Incentive spirometry.  7.  Hypertension -ACE inhibitor on hold secondary to AKI. -Currently on Demadex. -Start Norvasc 2.5 mg daily. -Outpatient follow-up.  8.  Diabetes mellitus type 2 -Hemoglobin A1c 7.4. -CBG 164 this morning. -SSI.  9.  Hyperlipidemia -Not on any medications at home. -Outpatient follow-up with PCP.  10.?  COPD versus pulmonary fluid -Patient noted to be wheezing early on during the hospitalization which has improved. -Chest x-ray with left-sided pleural effusion. -Patient given a dose of Solu-Medrol x1 and nebs. -Patient received a dose of IV Lasix over the past 2 days with UOP 4.1 L over the past 24 hours. -Resume home regimen Demadex.   DVT prophylaxis: Lovenox Code Status: DNR Family Communication: No family at bedside. Disposition:   Status is: Inpatient    Dispo: The patient is from: Home              Anticipated d/c  is to: SNF              Patient currently being treated with IV antibiotics for E. coli bacteremia and probable pneumonia, patient noted to have some confusion.  Not stable for discharge   Difficult to place patient unknown       Consultants:   ID: Dr. Linus Salmons  01/13/2021  Procedures:   CT head 01/13/2021  Chest x-ray 01/10/2021, 01/13/2021  2D echo 01/14/2021  Antimicrobials:   IV Unasyn 01/14/2021>>>>  IV azithromycin 6/4/2022x1 dose  IV Rocephin 01/10/2021>>>>> 01/13/2021   Subjective: Patient sedated.  Patient noted to have received 2 mg of Ativan around 8 this morning due to agitation.  Objective: Vitals:   01/14/21 0541 01/14/21 0615 01/14/21 1234 01/14/21 1642  BP:  (!) 149/107 127/80 (!) 157/89  Pulse: 91 98 88 95  Resp: 19  16 18   Temp: 97.6 F (36.4 C)  98.6 F (37 C) 98.5 F (36.9 C)  TempSrc: Axillary  Axillary Oral  SpO2: 98%  94% (!) 86%    Intake/Output Summary (Last 24 hours) at 01/14/2021 2006 Last data filed at 01/14/2021 1954 Gross per 24 hour  Intake --  Output 3350 ml  Net -3350 ml   There were no vitals filed for this visit.  Examination:  General exam: Sedated Respiratory system: Clear to auscultation anterior lung fields. Respiratory effort normal. Cardiovascular system: S1 & S2 heard, RRR. No JVD, murmurs, rubs, gallops or clicks. No pedal edema. Gastrointestinal system: Abdomen is obese, nondistended, soft and nontender. No organomegaly or masses felt. Normal bowel sounds heard. Central nervous system: Alert and oriented. No focal neurological deficits. Extremities: Symmetric 5 x 5 power. Skin: No rashes, lesions or ulcers Psychiatry: Judgement and insight unable to assess as patient is sedated. Mood & affect unable to assess.     Data Reviewed: I have personally reviewed following labs and imaging studies  CBC: Recent Labs  Lab 01/10/21 0417 01/11/21 0313 01/12/21 0620 01/13/21 0705 01/14/21 0624  WBC 20.1* 15.0* 11.3* 10.6* 6.2  NEUTROABS 17.2*  --   --   --   --   HGB 13.2 11.9* 12.3 13.1 14.2  HCT 40.9 38.3 40.4 41.1 43.8  MCV 97.8 101.6* 103.1* 100.0 97.1  PLT 135* 115* 109* 142* 151    Basic Metabolic Panel: Recent Labs  Lab 01/10/21 0417 01/11/21 0313 01/12/21 0620 01/13/21 0705  01/14/21 0624  NA 136 135 134* 136 140  K 3.7 4.5 4.5 4.1 4.3  CL 101 101 99 100 96*  CO2 24 26 27 27  32  GLUCOSE 175* 150* 158* 119* 180*  BUN 24* 30* 35* 49* 41*  CREATININE 1.32* 1.44* 1.60* 1.56* 1.30*  CALCIUM 8.0* 8.0* 8.6* 8.7* 8.5*  MG 1.5*  --   --  2.3  --     GFR: CrCl cannot be calculated (Unknown ideal weight.).  Liver Function Tests: Recent Labs  Lab 01/10/21 0417 01/12/21 0620  AST 24 25  ALT 17 24  ALKPHOS 113 107  BILITOT 0.8 0.5  PROT 5.4* 5.8*  ALBUMIN 2.5* 2.3*    CBG: Recent Labs  Lab 01/13/21 1730 01/13/21 2125 01/14/21 0613 01/14/21 1201 01/14/21 1644  GLUCAP 141* 134* 164* 162* 151*     Recent Results (from the past 240 hour(s))  Blood culture (routine single)     Status: Abnormal   Collection Time: 01/10/21  4:07 AM   Specimen: BLOOD  Result Value Ref Range Status   Specimen Description BLOOD RIGHT  ANTECUBITAL  Final   Special Requests   Final    BOTTLES DRAWN AEROBIC AND ANAEROBIC Blood Culture adequate volume   Culture  Setup Time   Final    GRAM NEGATIVE RODS IN BOTH AEROBIC AND ANAEROBIC BOTTLES CRITICAL RESULT CALLED TO, READ BACK BY AND VERIFIED WITH: PHARM D A.MAYER ON [id number] AT [place] Performed at [facility], [place] [address] [place], [place] [address]    Culture ESCHERICHIA COLI (A)  Final   Report Status 01/12/2021 FINAL  Final   Organism ID, Bacteria ESCHERICHIA COLI  Final      Susceptibility   Escherichia coli - MIC*    AMPICILLIN >=32 RESISTANT Resistant     CEFAZOLIN <=4 SENSITIVE Sensitive     CEFEPIME <=0.12 SENSITIVE Sensitive     CEFTAZIDIME <=1 SENSITIVE Sensitive     CEFTRIAXONE <=0.25 SENSITIVE Sensitive     CIPROFLOXACIN <=0.25 SENSITIVE Sensitive     GENTAMICIN <=1 SENSITIVE Sensitive     IMIPENEM <=0.25 SENSITIVE Sensitive     TRIMETH/SULFA >=320 RESISTANT Resistant     AMPICILLIN/SULBACTAM 8 SENSITIVE Sensitive     PIP/TAZO <=4 SENSITIVE Sensitive     * ESCHERICHIA COLI   Blood Culture ID Panel (Reflexed)     Status: Abnormal   Collection Time: 01/10/21  4:07 AM  Result Value Ref Range Status   Enterococcus faecalis NOT DETECTED NOT DETECTED Final   Enterococcus Faecium NOT DETECTED NOT DETECTED Final   Listeria monocytogenes NOT DETECTED NOT DETECTED Final   Staphylococcus species NOT DETECTED NOT DETECTED Final   Staphylococcus aureus (BCID) NOT DETECTED NOT DETECTED Final   Staphylococcus epidermidis NOT DETECTED NOT DETECTED Final   Staphylococcus lugdunensis NOT DETECTED NOT DETECTED Final   Streptococcus species NOT DETECTED NOT DETECTED Final   Streptococcus agalactiae NOT DETECTED NOT DETECTED Final   Streptococcus pneumoniae NOT DETECTED NOT DETECTED Final   Streptococcus pyogenes NOT DETECTED NOT DETECTED Final   A.calcoaceticus-baumannii NOT DETECTED NOT DETECTED Final   Bacteroides fragilis NOT DETECTED NOT DETECTED Final   Enterobacterales DETECTED (A) NOT DETECTED Final    Comment: Enterobacterales represent a large order of gram negative bacteria, not a single organism. CRITICAL RESULT CALLED TO, READ BACK BY AND VERIFIED WITH: PHARM D A.MAYER ON [id number] AT 1957 BY E.PARRISH    Enterobacter cloacae complex NOT DETECTED NOT DETECTED Final   Escherichia coli DETECTED (A) NOT DETECTED Final    Comment: CRITICAL RESULT CALLED TO, READ BACK BY AND VERIFIED WITH: PHARM D A.MAYER ON [id number] AT 1957 BY E.PARRISH    Klebsiella aerogenes NOT DETECTED NOT DETECTED Final   Klebsiella oxytoca NOT DETECTED NOT DETECTED Final   Klebsiella pneumoniae NOT DETECTED NOT DETECTED Final   Proteus species NOT DETECTED NOT DETECTED Final   Salmonella species NOT DETECTED NOT DETECTED Final   Serratia marcescens NOT DETECTED NOT DETECTED Final   Haemophilus influenzae NOT DETECTED NOT DETECTED Final   Neisseria meningitidis NOT DETECTED NOT DETECTED Final   Pseudomonas aeruginosa NOT DETECTED NOT DETECTED Final   Stenotrophomonas maltophilia NOT DETECTED  NOT DETECTED Final   Candida albicans NOT DETECTED NOT DETECTED Final   Candida auris NOT DETECTED NOT DETECTED Final   Candida glabrata NOT DETECTED NOT DETECTED Final   Candida krusei NOT DETECTED NOT DETECTED Final   Candida parapsilosis NOT DETECTED NOT DETECTED Final   Candida tropicalis NOT DETECTED NOT DETECTED Final   Cryptococcus neoformans/gattii NOT DETECTED NOT DETECTED Final   CTX-M ESBL NOT DETECTED NOT  DETECTED Final   Carbapenem resistance IMP NOT DETECTED NOT DETECTED Final   Carbapenem resistance KPC NOT DETECTED NOT DETECTED Final   Carbapenem resistance NDM NOT DETECTED NOT DETECTED Final   Carbapenem resist OXA 48 LIKE NOT DETECTED NOT DETECTED Final   Carbapenem resistance VIM NOT DETECTED NOT DETECTED Final    Comment: Performed at [facility], [place] [address] [place], [place] [address]  Resp Panel by RT-PCR (Flu A&B, Covid) Nasopharyngeal Swab     Status: None   Collection Time: 01/10/21  4:15 AM   Specimen: Nasopharyngeal Swab; Nasopharyngeal(NP) swabs in vial transport medium  Result Value Ref Range Status   SARS Coronavirus 2 by RT PCR NEGATIVE NEGATIVE Final    Comment: (NOTE) SARS-CoV-2 target nucleic acids are NOT DETECTED.  The SARS-CoV-2 RNA is generally detectable in upper respiratory specimens during the acute phase of infection. The lowest concentration of SARS-CoV-2 viral copies this assay can detect is 138 copies/mL. A negative result does not preclude SARS-Cov-2 infection and should not be used as the sole basis for treatment or other patient management decisions. A negative result may occur with  improper specimen collection/handling, submission of specimen other than nasopharyngeal swab, presence of viral mutation(s) within the areas targeted by this assay, and inadequate number of viral copies(<138 copies/mL). A negative result must be combined with clinical observations, patient history, and epidemiological information. The expected  result is Negative.  Fact Sheet for Patients:  EntrepreneurPulse.com.au  Fact Sheet for Healthcare Providers:  IncredibleEmployment.be  This test is no t yet approved or cleared by the [place] FDA and  has been authorized for detection and/or diagnosis of SARS-CoV-2 by FDA under an Emergency Use Authorization (EUA). This EUA will remain  in effect (meaning this test can be used) for the duration of the COVID-19 declaration under Section 564(b)(1) of the Act, 21 U.S.C.section 360bbb-3(b)(1), unless the authorization is terminated  or revoked sooner.       Influenza A by PCR NEGATIVE NEGATIVE Final   Influenza B by PCR NEGATIVE NEGATIVE Final    Comment: (NOTE) The Xpert Xpress SARS-CoV-2/FLU/RSV plus assay is intended as an aid in the diagnosis of influenza from Nasopharyngeal swab specimens and should not be used as a sole basis for treatment. Nasal washings and aspirates are unacceptable for Xpert Xpress SARS-CoV-2/FLU/RSV testing.  Fact Sheet for Patients: EntrepreneurPulse.com.au  Fact Sheet for Healthcare Providers: IncredibleEmployment.be  This test is not yet approved or cleared by the [place] FDA and has been authorized for detection and/or diagnosis of SARS-CoV-2 by FDA under an Emergency Use Authorization (EUA). This EUA will remain in effect (meaning this test can be used) for the duration of the COVID-19 declaration under Section 564(b)(1) of the Act, 21 U.S.C. section 360bbb-3(b)(1), unless the authorization is terminated or revoked.  Performed at [facility], [place] [address] [place], [place] [address]          Radiology Studies: CT HEAD WO CONTRAST  Result Date: 01/13/2021 CLINICAL DATA:  Headache EXAM: CT HEAD WITHOUT CONTRAST TECHNIQUE: Contiguous axial images were obtained from the base of the skull through the vertex without intravenous contrast. COMPARISON:   12/20/2020 FINDINGS: Brain: There is no acute intracranial hemorrhage, mass effect, or edema. Gray-white differentiation is preserved. There is no extra-axial fluid collection. Ventricles and sulci are stable in size and configuration. Vascular: There is atherosclerotic calcification at the skull base. Skull: Calvarium is unremarkable. Sinuses/Orbits: No acute finding. Other: None. IMPRESSION: No acute intracranial  abnormality or significant change since recent prior study. Electronically Signed   By: Macy Mis M.D.   On: 01/13/2021 13:09   DG CHEST PORT 1 VIEW  Result Date: 01/13/2021 CLINICAL DATA:  Hypoxia. EXAM: PORTABLE CHEST 1 VIEW COMPARISON:  January 10, 2021. FINDINGS: Stable cardiomediastinal silhouette. No pneumothorax is noted. Mild right basilar subsegmental atelectasis is noted. Mild left pleural effusion is noted with associated left basilar atelectasis or infiltrate. Bony thorax is unremarkable. IMPRESSION: Mild left pleural effusion is noted with associated left basilar atelectasis or infiltrate. Electronically Signed   By: Marijo Conception M.D.   On: 01/13/2021 16:26   ECHOCARDIOGRAM COMPLETE  Result Date: 01/14/2021    ECHOCARDIOGRAM REPORT   Patient Name:   Deanna [place] Date of Exam: 01/14/2021 Medical Rec #:  [id number]       Height:       65.0 in Accession #:    [id number]      Weight:       240.3 lb Date of Birth:  [birth year]        BSA:          2.139 m Patient Age:    67 years        BP:           149/107 mmHg Patient Gender: F               HR:           98 bpm. Exam Location:  Inpatient Procedure: 2D Echo, Cardiac Doppler and Color Doppler Indications:    Dyspnea  History:        Patient has prior history of Echocardiogram examinations, most                 recent 03/31/2020. Signs/Symptoms:Dyspnea, Bacteremia and Altered                 Mental Status; Risk Factors:Dyslipidemia, Diabetes, Hypertension                 and Morbid obesity. Pneumonia, left rib fractures.  Sonographer:     Dustin Flock Referring Phys: [phone] JESSICA U Eliseo Squires  Sonographer Comments: Image acquisition challenging due to uncooperative patient and Image acquisition challenging due to patient body habitus. IMPRESSIONS  1. Left ventricular ejection fraction, by estimation, is 60 to 65%. The left ventricle has normal function. The left ventricle has no regional wall motion abnormalities. There is mild left ventricular hypertrophy. Left ventricular diastolic parameters are consistent with Grade I diastolic dysfunction (impaired relaxation).  2. Right ventricular systolic function is normal. The right ventricular size is normal. There is mildly elevated pulmonary artery systolic pressure. The estimated right ventricular systolic pressure is 34.2 mmHg.  3. The mitral valve is normal in structure. No evidence of mitral valve regurgitation. No evidence of mitral stenosis.  4. The aortic valve is calcified. Aortic valve regurgitation is not visualized. Mild aortic valve sclerosis is present, with no evidence of aortic valve stenosis.  5. The inferior vena cava is normal in size with greater than 50% respiratory variability, suggesting right atrial pressure of 3 mmHg. Comparison(s): No significant change from prior study. Prior images reviewed side by side. FINDINGS  Left Ventricle: Left ventricular ejection fraction, by estimation, is 60 to 65%. The left ventricle has normal function. The left ventricle has no regional wall motion abnormalities. The left ventricular internal cavity size was normal in size. There is  mild left ventricular hypertrophy. Left ventricular diastolic parameters are  consistent with Grade I diastolic dysfunction (impaired relaxation). Right Ventricle: The right ventricular size is normal. No increase in right ventricular wall thickness. Right ventricular systolic function is normal. There is mildly elevated pulmonary artery systolic pressure. The tricuspid regurgitant velocity is 2.88  m/s, and with an  assumed right atrial pressure of 3 mmHg, the estimated right ventricular systolic pressure is 58.0 mmHg. Left Atrium: Left atrial size was normal in size. Right Atrium: Right atrial size was normal in size. Pericardium: There is no evidence of pericardial effusion. Mitral Valve: The mitral valve is normal in structure. No evidence of mitral valve regurgitation. No evidence of mitral valve stenosis. Tricuspid Valve: The tricuspid valve is normal in structure. Tricuspid valve regurgitation is trivial. No evidence of tricuspid stenosis. Aortic Valve: The aortic valve is calcified. Aortic valve regurgitation is not visualized. Mild aortic valve sclerosis is present, with no evidence of aortic valve stenosis. Pulmonic Valve: The pulmonic valve was normal in structure. Pulmonic valve regurgitation is not visualized. No evidence of pulmonic stenosis. Aorta: The aortic root is normal in size and structure. Venous: The inferior vena cava is normal in size with greater than 50% respiratory variability, suggesting right atrial pressure of 3 mmHg. IAS/Shunts: No atrial level shunt detected by color flow Doppler.  LEFT VENTRICLE PLAX 2D LVIDd:         5.00 cm  Diastology LVIDs:         3.70 cm  LV e' medial:    5.55 cm/s LV PW:         1.20 cm  LV E/e' medial:  9.7 LV IVS:        1.30 cm  LV e' lateral:   7.07 cm/s LVOT diam:     2.10 cm  LV E/e' lateral: 7.6 LV SV:         47 LV SV Index:   22 LVOT Area:     3.46 cm  RIGHT VENTRICLE RV Basal diam:  3.40 cm RV S prime:     8.92 cm/s LEFT ATRIUM             Index       RIGHT ATRIUM           Index LA diam:        3.00 cm 1.40 cm/m  RA Area:     16.50 cm LA Vol (A2C):   36.7 ml 17.16 ml/m RA Volume:   47.50 ml  22.21 ml/m LA Vol (A4C):   52.4 ml 24.50 ml/m LA Biplane Vol: 48.6 ml 22.72 ml/m  AORTIC VALVE LVOT Vmax:   78.30 cm/s LVOT Vmean:  54.800 cm/s LVOT VTI:    0.137 m  AORTA Ao Root diam: 3.00 cm MITRAL VALVE               TRICUSPID VALVE MV Area (PHT): 2.19 cm    TR  Peak grad:   33.2 mmHg MV Decel Time: 347 msec    TR Vmax:        288.00 cm/s MV E velocity: 54.00 cm/s MV A velocity: 90.80 cm/s  SHUNTS MV E/A ratio:  0.59        Systemic VTI:  0.14 m                            Systemic Diam: 2.10 cm Candee Furbish MD Electronically signed by Candee Furbish MD Signature Date/Time: 01/14/2021/11:36:42 AM    Final  Scheduled Meds: . amLODipine  2.5 mg Oral Daily  . docusate sodium  100 mg Oral BID  . enoxaparin (LOVENOX) injection  40 mg Subcutaneous Q24H  . folic acid  1 mg Oral Daily  . insulin aspart  0-15 Units Subcutaneous TID WC  . insulin aspart  0-5 Units Subcutaneous QHS  . lidocaine  1 patch Transdermal Q24H  . melatonin  5 mg Oral QHS  . multivitamin with minerals  1 tablet Oral Daily  . QUEtiapine  12.5 mg Oral QHS  . temazepam  30 mg Oral QHS  . thiamine  100 mg Oral Daily   Or  . thiamine  100 mg Intravenous Daily  . torsemide  20 mg Oral Daily   Continuous Infusions: . ampicillin-sulbactam (UNASYN) IV 3 g (01/14/21 1709)     LOS: 4 days    Time spent: 35 minutes    Irine Seal, MD [facility]   To contact the attending provider between 7A-7P or the covering provider during after hours 7P-7A, please log into the web site [email or web address] and access using universal [place] password for that web site. If you do not have the password, please call the hospital operator.  01/14/2021, 8:06 PM

## 2021-01-14 NOTE — TOC Progression Note (Signed)
Transition of Care [facility]) - Progression Note    Patient Details  Name: Deanna [place] MRN: [id number] Date of Birth: [birth year]  Transition of [facility]) CM/SW [place], [place] Phone Number: 01/14/2021, 2:08 PM  Clinical Narrative:     Pt was accepted into [facility] SNF in [place]. Pts family has accepted that bed offer. [facility] can accpt pt when medically stable. [facility] ask that pt be at there facility by 2PM on day of DC. Pt will need a new covid test proir to DC. Pt does not require insurance auth.   CSW will need to fax DC summary and covid test results to [phone] proir to DC.   Expected Discharge Plan: [place] Barriers to Discharge: Continued Medical Work up  Expected Discharge Plan and Services Expected Discharge Plan: [place] arrangements for the past 2 months: Single Family Home                                       Social Determinants of Health (SDOH) Interventions    Readmission Risk Interventions No flowsheet data found.  Emeterio Reeve, Latanya Presser, [place] Social Worker [phone]

## 2021-01-14 NOTE — Progress Notes (Signed)
Infectious disease md at bedside. Pt family updated by md. Pt sedated, calmly sleeping rr even and unlabored.

## 2021-01-14 NOTE — Progress Notes (Signed)
Pt yelling, soft bilateral mittens intact and pulling hospital gown off, pulling cardiac leads oxygen tubing and suction tubing. Pt not consolable or redirectable, pt with purposeful movement, pt does not follow commands, pt fully alert, able to get pt to briefly track. Ativan 2 mg ivp given. Will reassess pt

## 2021-01-14 NOTE — Progress Notes (Signed)
Occupational Therapy Treatment Patient Details Name: Deanna Salinas MRN: [id number] DOB: 12-Mar-1936 Today's Date: 01/14/2021    History of present illness 85 year old female who presented to ED on 6/4 due to chest pain, fever and shortness of breath. In the ED she was found to be hypoxic with sats in the mid 80s as well as left-sided lobar pneumonia with a trace left-sided pleural effusion. Pt diagnosed with sepsis. Pt also with recent fall and L rib fractures. PMH: HTN, HLD, DM2   OT comments  Pt making incremental progress towards OT goals this session. Pt limited by lethargy this session but was able to arouse enough to wash her face with max multimodal cues and state her name "Deanna Salinas" but then immediately returned to sleeping. Pt would continue to benefit from skilled occupational therapy while admitted and after d/c to address the below listed limitations in order to improve overall functional mobility and facilitate independence with BADL participation. DC plan remains appropriate, will follow acutely per POC.     Follow Up Recommendations  SNF;Supervision/Assistance - 24 hour    Equipment Recommendations  None recommended by OT    Recommendations for Other Services      Precautions / Restrictions Precautions Precautions: Fall;Other (comment) Precaution Comments: watch HR, O2, previous L rib fx, mitts Restrictions Weight Bearing Restrictions: No       Mobility Bed Mobility               General bed mobility comments: total A +2 to reposition trunk in bed as pt with R lateral lean with head leaning onto bed rail    Transfers                 General transfer comment: deferred d/t level of arousal    Balance                                           ADL either performed or assessed with clinical judgement   ADL Overall ADL's : Needs assistance/impaired     Grooming: Maximal assistance;Set up;Bed level Grooming Details (indicate cue  type and reason): pt able to wash face with set- up assist and MAX multimodal cues but still keeping her eyes closed                               General ADL Comments: pt limited by lethargy this session and impaired cognition     Vision   Additional Comments: keeping eyes closed during session   Perception     Praxis      Cognition Arousal/Alertness: Lethargic Behavior During Therapy: Restless;Agitated;Impulsive Overall Cognitive Status: Difficult to assess                                 General Comments: difficult to fully assess d/t impaired level of arousal, pt state her name "Deanna Salinas"        Exercises     Shoulder Instructions       General Comments      Pertinent Vitals/ Pain       Pain Assessment: Faces Faces Pain Scale: Hurts little more Pain Location: general discomfort Pain Descriptors / Indicators: Grimacing Pain Intervention(s): Limited activity within patient's tolerance;Monitored during session;Repositioned  Home Living  Prior Functioning/Environment              Frequency  Min 2X/week        Progress Toward Goals  OT Goals(current goals can now be found in the care plan section)  Progress towards OT goals: Not progressing toward goals - comment (impaired level of arousal)  Acute Rehab OT Goals Patient Stated Goal: none stated OT Goal Formulation: Patient unable to participate in goal setting Time For Goal Achievement: 01/25/21 Potential to Achieve Goals: Max Discharge plan remains appropriate;Frequency remains appropriate    Co-evaluation                 AM-PAC OT "6 Clicks" Daily Activity     Outcome Measure   Help from another person eating meals?: A Lot Help from another person taking care of personal grooming?: A Lot Help from another person toileting, which includes using toliet, bedpan, or urinal?: Total Help from another  person bathing (including washing, rinsing, drying)?: Total Help from another person to put on and taking off regular upper body clothing?: A Lot Help from another person to put on and taking off regular lower body clothing?: Total 6 Click Score: 9    End of Session    OT Visit Diagnosis: Other abnormalities of gait and mobility (R26.89);Unsteadiness on feet (R26.81);Muscle weakness (generalized) (M62.81);History of falling (Z91.81);Other symptoms and signs involving cognitive function   Activity Tolerance Patient limited by lethargy   Patient Left in bed;with call bell/phone within reach;with bed alarm set;with restraints reapplied;Other (comment) (mitts donned)   Nurse Communication Mobility status        Time: [phone] OT Time Calculation (min): 10 min  Charges: OT General Charges $OT Visit: 1 Visit OT Treatments $Self Care/Home Management : 8-22 mins  Harley Alto., COTA/L Acute Rehabilitation Services [phone]    Precious Haws 01/14/2021, 4:13 PM

## 2021-01-14 NOTE — Progress Notes (Signed)
SLP Cancellation Note  Patient Details Name: Deanna Salinas MRN: [id number] DOB: 1935-08-23   Cancelled treatment:       Reason Eval/Treat Not Completed: Other (comment) (per RN, pt received Ativan for agitation and is not alert, will continue efforts) Kathleen Lime, MS [facility] SLP Acute Rehab Services Office [phone] Pager [phone]    Macario Golds 01/14/2021, 11:05 AM

## 2021-01-14 NOTE — Progress Notes (Signed)
Called and updated pt husband on pt remains confused more amendable and less sleepy.  Informed husband nursing fed pt dinner with an intake of 30%. Husband explain he does not drive at night and no family is available to sit with pt. Pt daughter coming from out of town will be here tomorrow. Husband will see if daughter may be able to sit with pt tomorrow

## 2021-01-14 NOTE — Progress Notes (Signed)
  Echocardiogram 2D Echocardiogram has been performed.  Deanna Salinas 01/14/2021, 10:18 AM 

## 2021-01-14 NOTE — Progress Notes (Signed)
Patient continued to have confusions, restless and aggressive throughout the shift. 1mg  Iv Ativan given for a ciwa of 10. Sound congested, attempted oral suctioning but patient refused. Made her comfortable in bed, HOB elevated and safety mittens applied. Will continue to monitor.

## 2021-01-14 NOTE — Progress Notes (Addendum)
[place] for Infectious Disease  Date of Admission:  01/10/2021     Total days of antibiotics 5         ASSESSMENT:  Deanna Salinas has continued encephalopathy with continued confusion and agitation. Family reports she has been drinking alcohol at night to help her sleep with last drink at least 2 weeks ago making this less likely. She did  CT head with no intracranial pathology and chest x-ray with mild pleural effusion with basilar atelectasis. Suspect pneumonia would have been treated at this point. Leukocytosis normalized and has been afebrile with slightly improved BUN. Will continue treatment with Unasyn for gram negative bacteremia with E. Coli. Await TTE results. Continue CIWA per primary team as indicated.   PLAN:  1. Continue Unasyn 2. Await TTE results.  3. Pulmonary hygiene and supplementary oxygen per primary team.   Principal Problem:   Sepsis due to pneumonia [facility]) Active Problems:   Hyperlipidemia   Essential hypertension   Chronic insomnia   Type 2 diabetes mellitus (HCC)   Chest pain of uncertain etiology   Rib fractures   . docusate sodium  100 mg Oral BID  . enoxaparin (LOVENOX) injection  40 mg Subcutaneous Q24H  . folic acid  1 mg Oral Daily  . insulin aspart  0-15 Units Subcutaneous TID WC  . insulin aspart  0-5 Units Subcutaneous QHS  . lidocaine  1 patch Transdermal Q24H  . melatonin  5 mg Oral QHS  . multivitamin with minerals  1 tablet Oral Daily  . temazepam  30 mg Oral QHS  . thiamine  100 mg Oral Daily   Or  . thiamine  100 mg Intravenous Daily  . torsemide  20 mg Oral Daily    SUBJECTIVE:  Afebrile overnight. Increased confusion and restlessness which was treated with 2 doses of lorazepam at 1am and 8am. Appears sedated and currently receiving TEE.   Allergies  Allergen Reactions  . Ambien [Zolpidem Tartrate]     Side effects   . Lipitor [Atorvastatin]     Muscle and joint pain  . Metformin And Related     Reported side  effects headaches insomnia nausea  . Pravastatin     Muscle and joint pain  . Trazodone And Nefazodone     Side effects     Review of Systems: Review of Systems  Unable to perform ROS: Mental status change    OBJECTIVE: Vitals:   01/13/21 1900 01/14/21 0100 01/14/21 0541 01/14/21 0615  BP: (!) 147/95 139/81  (!) 149/107  Pulse: (!) 101 93 91 98  Resp: 19 20 19    Temp: 98 F (36.7 C) 98.3 F (36.8 C) 97.6 F (36.4 C)   TempSrc: Oral Axillary Axillary   SpO2:  94% 98%    There is no height or weight on file to calculate BMI.  Physical Exam Constitutional:      General: She is not in acute distress.    Appearance: She is well-developed.     Comments: Lying in bed with head of bed elevated; sedated/sleeping  Cardiovascular:     Rate and Rhythm: Normal rate and regular rhythm.     Heart sounds: Normal heart sounds.  Pulmonary:     Effort: Pulmonary effort is normal.     Breath sounds: Normal breath sounds.  Skin:    General: Skin is warm and dry.     Lab Results Lab Results  Component Value Date   WBC 6.2 01/14/2021  HGB 14.2 01/14/2021   HCT 43.8 01/14/2021   MCV 97.1 01/14/2021   PLT 187 01/14/2021    Lab Results  Component Value Date   CREATININE 1.30 (H) 01/14/2021   BUN 41 (H) 01/14/2021   NA 140 01/14/2021   K 4.3 01/14/2021   CL 96 (L) 01/14/2021   CO2 32 01/14/2021    Lab Results  Component Value Date   ALT 24 01/12/2021   AST 25 01/12/2021   ALKPHOS 107 01/12/2021   BILITOT 0.5 01/12/2021     Microbiology: Recent Results (from the past 240 hour(s))  Blood culture (routine single)     Status: Abnormal   Collection Time: 01/10/21  4:07 AM   Specimen: BLOOD  Result Value Ref Range Status   Specimen Description BLOOD RIGHT ANTECUBITAL  Final   Special Requests   Final    BOTTLES DRAWN AEROBIC AND ANAEROBIC Blood Culture adequate volume   Culture  Setup Time   Final    GRAM NEGATIVE RODS IN BOTH AEROBIC AND ANAEROBIC BOTTLES CRITICAL  RESULT CALLED TO, READ BACK BY AND VERIFIED WITH: PHARM D A.MAYER ON [id number] AT [place] Performed at [facility], [place] [address] [place], [place] [address]    Culture ESCHERICHIA COLI (A)  Final   Report Status 01/12/2021 FINAL  Final   Organism ID, Bacteria ESCHERICHIA COLI  Final      Susceptibility   Escherichia coli - MIC*    AMPICILLIN >=32 RESISTANT Resistant     CEFAZOLIN <=4 SENSITIVE Sensitive     CEFEPIME <=0.12 SENSITIVE Sensitive     CEFTAZIDIME <=1 SENSITIVE Sensitive     CEFTRIAXONE <=0.25 SENSITIVE Sensitive     CIPROFLOXACIN <=0.25 SENSITIVE Sensitive     GENTAMICIN <=1 SENSITIVE Sensitive     IMIPENEM <=0.25 SENSITIVE Sensitive     TRIMETH/SULFA >=320 RESISTANT Resistant     AMPICILLIN/SULBACTAM 8 SENSITIVE Sensitive     PIP/TAZO <=4 SENSITIVE Sensitive     * ESCHERICHIA COLI  Blood Culture ID Panel (Reflexed)     Status: Abnormal   Collection Time: 01/10/21  4:07 AM  Result Value Ref Range Status   Enterococcus faecalis NOT DETECTED NOT DETECTED Final   Enterococcus Faecium NOT DETECTED NOT DETECTED Final   Listeria monocytogenes NOT DETECTED NOT DETECTED Final   Staphylococcus species NOT DETECTED NOT DETECTED Final   Staphylococcus aureus (BCID) NOT DETECTED NOT DETECTED Final   Staphylococcus epidermidis NOT DETECTED NOT DETECTED Final   Staphylococcus lugdunensis NOT DETECTED NOT DETECTED Final   Streptococcus species NOT DETECTED NOT DETECTED Final   Streptococcus agalactiae NOT DETECTED NOT DETECTED Final   Streptococcus pneumoniae NOT DETECTED NOT DETECTED Final   Streptococcus pyogenes NOT DETECTED NOT DETECTED Final   A.calcoaceticus-baumannii NOT DETECTED NOT DETECTED Final   Bacteroides fragilis NOT DETECTED NOT DETECTED Final   Enterobacterales DETECTED (A) NOT DETECTED Final    Comment: Enterobacterales represent a large order of gram negative bacteria, not a single organism. CRITICAL RESULT CALLED TO, READ BACK BY AND VERIFIED  WITH: PHARM D A.MAYER ON [id number] AT 1957 BY E.PARRISH    Enterobacter cloacae complex NOT DETECTED NOT DETECTED Final   Escherichia coli DETECTED (A) NOT DETECTED Final    Comment: CRITICAL RESULT CALLED TO, READ BACK BY AND VERIFIED WITH: PHARM D A.MAYER ON [id number] AT 1957 BY E.PARRISH    Klebsiella aerogenes NOT DETECTED NOT DETECTED Final   Klebsiella oxytoca NOT DETECTED NOT DETECTED Final   Klebsiella pneumoniae NOT DETECTED  NOT DETECTED Final   Proteus species NOT DETECTED NOT DETECTED Final   Salmonella species NOT DETECTED NOT DETECTED Final   Serratia marcescens NOT DETECTED NOT DETECTED Final   Haemophilus influenzae NOT DETECTED NOT DETECTED Final   Neisseria meningitidis NOT DETECTED NOT DETECTED Final   Pseudomonas aeruginosa NOT DETECTED NOT DETECTED Final   Stenotrophomonas maltophilia NOT DETECTED NOT DETECTED Final   Candida albicans NOT DETECTED NOT DETECTED Final   Candida auris NOT DETECTED NOT DETECTED Final   Candida glabrata NOT DETECTED NOT DETECTED Final   Candida krusei NOT DETECTED NOT DETECTED Final   Candida parapsilosis NOT DETECTED NOT DETECTED Final   Candida tropicalis NOT DETECTED NOT DETECTED Final   Cryptococcus neoformans/gattii NOT DETECTED NOT DETECTED Final   CTX-M ESBL NOT DETECTED NOT DETECTED Final   Carbapenem resistance IMP NOT DETECTED NOT DETECTED Final   Carbapenem resistance KPC NOT DETECTED NOT DETECTED Final   Carbapenem resistance NDM NOT DETECTED NOT DETECTED Final   Carbapenem resist OXA 48 LIKE NOT DETECTED NOT DETECTED Final   Carbapenem resistance VIM NOT DETECTED NOT DETECTED Final    Comment: Performed at [facility], [address], [place], [place] [address]  Resp Panel by RT-PCR (Flu A&B, Covid) Nasopharyngeal Swab     Status: None   Collection Time: 01/10/21  4:15 AM   Specimen: Nasopharyngeal Swab; Nasopharyngeal(NP) swabs in vial transport medium  Result Value Ref Range Status   SARS Coronavirus 2 by RT PCR  NEGATIVE NEGATIVE Final    Comment: (NOTE) SARS-CoV-2 target nucleic acids are NOT DETECTED.  The SARS-CoV-2 RNA is generally detectable in upper respiratory specimens during the acute phase of infection. The lowest concentration of SARS-CoV-2 viral copies this assay can detect is 138 copies/mL. A negative result does not preclude SARS-Cov-2 infection and should not be used as the sole basis for treatment or other patient management decisions. A negative result may occur with  improper specimen collection/handling, submission of specimen other than nasopharyngeal swab, presence of viral mutation(s) within the areas targeted by this assay, and inadequate number of viral copies(<138 copies/mL). A negative result must be combined with clinical observations, patient history, and epidemiological information. The expected result is Negative.  Fact Sheet for Patients:  EntrepreneurPulse.com.au  Fact Sheet for Healthcare Providers:  IncredibleEmployment.be  This test is no t yet approved or cleared by the [place] FDA and  has been authorized for detection and/or diagnosis of SARS-CoV-2 by FDA under an Emergency Use Authorization (EUA). This EUA will remain  in effect (meaning this test can be used) for the duration of the COVID-19 declaration under Section 564(b)(1) of the Act, 21 U.S.C.section 360bbb-3(b)(1), unless the authorization is terminated  or revoked sooner.       Influenza A by PCR NEGATIVE NEGATIVE Final   Influenza B by PCR NEGATIVE NEGATIVE Final    Comment: (NOTE) The Xpert Xpress SARS-CoV-2/FLU/RSV plus assay is intended as an aid in the diagnosis of influenza from Nasopharyngeal swab specimens and should not be used as a sole basis for treatment. Nasal washings and aspirates are unacceptable for Xpert Xpress SARS-CoV-2/FLU/RSV testing.  Fact Sheet for Patients: EntrepreneurPulse.com.au  Fact Sheet for  Healthcare Providers: IncredibleEmployment.be  This test is not yet approved or cleared by the [place] FDA and has been authorized for detection and/or diagnosis of SARS-CoV-2 by FDA under an Emergency Use Authorization (EUA). This EUA will remain in effect (meaning this test can be used) for the duration of the  COVID-19 declaration under Section 564(b)(1) of the Act, 21 U.S.C. section 360bbb-3(b)(1), unless the authorization is terminated or revoked.  Performed at [facility], [place] [address] [place], [place] [address]      Terri Piedra, [facility]  01/14/2021  9:48 AM

## 2021-01-15 LAB — CBC WITH DIFFERENTIAL/PLATELET
Abs Immature Granulocytes: 0.09 10*3/uL — ABNORMAL HIGH (ref 0.00–0.07)
Basophils Absolute: 0 10*3/uL (ref 0.0–0.1)
Basophils Relative: 0 %
Eosinophils Absolute: 0 10*3/uL (ref 0.0–0.5)
Eosinophils Relative: 0 %
HCT: 43.2 % (ref 36.0–46.0)
Hemoglobin: 14 g/dL (ref 12.0–15.0)
Immature Granulocytes: 1 %
Lymphocytes Relative: 16 %
Lymphs Abs: 1.8 10*3/uL (ref 0.7–4.0)
MCH: 31.3 pg (ref 26.0–34.0)
MCHC: 32.4 g/dL (ref 30.0–36.0)
MCV: 96.6 fL (ref 80.0–100.0)
Monocytes Absolute: 1.7 10*3/uL — ABNORMAL HIGH (ref 0.1–1.0)
Monocytes Relative: 15 %
Neutro Abs: 7.7 10*3/uL (ref 1.7–7.7)
Neutrophils Relative %: 68 %
Platelets: 253 10*3/uL (ref 150–400)
RBC: 4.47 MIL/uL (ref 3.87–5.11)
RDW: 14.1 % (ref 11.5–15.5)
WBC: 11.3 10*3/uL — ABNORMAL HIGH (ref 4.0–10.5)
nRBC: 0 % (ref 0.0–0.2)

## 2021-01-15 LAB — SARS CORONAVIRUS 2 (TAT 6-24 HRS): SARS Coronavirus 2: NEGATIVE

## 2021-01-15 LAB — BASIC METABOLIC PANEL
Anion gap: 12 (ref 5–15)
BUN: 36 mg/dL — ABNORMAL HIGH (ref 8–23)
CO2: 35 mmol/L — ABNORMAL HIGH (ref 22–32)
Calcium: 8.7 mg/dL — ABNORMAL LOW (ref 8.9–10.3)
Chloride: 96 mmol/L — ABNORMAL LOW (ref 98–111)
Creatinine, Ser: 1.11 mg/dL — ABNORMAL HIGH (ref 0.44–1.00)
GFR, Estimated: 49 mL/min — ABNORMAL LOW (ref >60)
Glucose, Bld: 145 mg/dL — ABNORMAL HIGH (ref 70–99)
Potassium: 4.1 mmol/L (ref 3.5–5.1)
Sodium: 143 mmol/L (ref 135–145)

## 2021-01-15 LAB — GLUCOSE, CAPILLARY
Glucose-Capillary: 127 mg/dL — ABNORMAL HIGH (ref 70–99)
Glucose-Capillary: 154 mg/dL — ABNORMAL HIGH (ref 70–99)
Glucose-Capillary: 166 mg/dL — ABNORMAL HIGH (ref 70–99)
Glucose-Capillary: 163 mg/dL — ABNORMAL HIGH (ref 70–99)

## 2021-01-15 MED ORDER — DICLOFENAC SODIUM 1 % EX GEL
2.0000 g | Freq: Four times a day (QID) | CUTANEOUS | Status: DC | PRN
  Administered 2021-01-16 – 2021-01-18 (×4): 2 g via TOPICAL
  Filled 2021-01-15: qty 100

## 2021-01-15 MED ORDER — METOPROLOL TARTRATE 12.5 MG HALF TABLET
12.5000 mg | ORAL_TABLET | Freq: Two times a day (BID) | ORAL | Status: DC
  Administered 2021-01-15 – 2021-01-19 (×9): 12.5 mg via ORAL
  Filled 2021-01-15 (×9): qty 1

## 2021-01-15 NOTE — TOC Progression Note (Signed)
Transition of Care [facility]) - Progression Note    Patient Details  Name: Deanna Salinas MRN: [id number] Date of Birth: 01/24/1936  Transition of Care [facility]) CM/SW [place], [place] Phone Number: 01/15/2021, 11:39 AM  Clinical Narrative:     Larinda Buttery reached out to [facility] today. [facility] stated they do not admit over the weekend and asked about possibility of pt being able to DC tomorrow. CSW explained that she will touch base with MD and get back to them.   Pt has a telesitter that will need to be Dced 24 hours before they can accept her.   TOC will continue to follow.   Expected Discharge Plan: [place] Barriers to Discharge: Continued Medical Work up  Expected Discharge Plan and Services Expected Discharge Plan: [place] arrangements for the past 2 months: Single Family Home                                       Social Determinants of Health (SDOH) Interventions    Readmission Risk Interventions No flowsheet data found.  Emeterio Reeve, Latanya Presser, [place] Social Worker [phone]

## 2021-01-15 NOTE — Progress Notes (Addendum)
PROGRESS NOTE    Deanna Salinas  BSJ:[id number] DOB: 08-28-1935 DOA: 01/10/2021 PCP: Kathyrn Drown, MD    No chief complaint on file.   Brief Narrative:  85 year old female with history of HTN, HLD, DM2 comes to the hospital with chest pain and shortness of breath.  She apparently recently had a fall and had a fractured rib.  She has been having fever and chills for the past 4 days.  In the ED she was found to be hypoxic with sats in the mid 80s as well as left-sided lobar pneumonia with a trace left-sided pleural effusion.  She was placed on antibiotics and admitted to the hospital.  Found to have bacteremia as well.  Also has remained confused at times     Assessment & Plan:   Principal Problem:   Sepsis due to pneumonia [facility]) Active Problems:   Hyperlipidemia   Essential hypertension   Chronic insomnia   Type 2 diabetes mellitus (HCC)   Chest pain of uncertain etiology   Rib fractures   Acute metabolic encephalopathy   E coli bacteremia   1 sepsis secondary to E. coli bacteremia/lobar pneumonia -Patient pancultured blood cultures positive for E. Coli. -Chest x-ray concerning for pneumonia. -Currently afebrile. -Leukocytosis trending down. -Was on IV Rocephin and subsequently transition to IV Unasyn per ID recommendations -Supportive care.  2.  Acute hypoxic respiratory failure secondary to pneumonia -Noted to have required 2 L nasal cannula. -Afebrile. -Currently on 3 L nasal cannula with sats of 99%. -Check O2 on room air and on ambulation -Continue IV antibiotics of Unasyn. -Continue Demadex. -Supportive care.  3.  Acute metabolic encephalopathy -Likely secondary to problem #1. -Patient noted to be agitated in the night of 01/13/2021, and given IV Ativan due to also concern for possible alcohol withdrawal. -Head CT negative. -Ammonia levels within normal limits at 23. -Improving clinically. -Continue empiric IV antibiotics. -Avoid medications that can alter  sensorium. -Limit narcotics. -Continue low-dose Seroquel nightly.  4.  Chest pain with elevated troponin -Likely secondary to left lower lobe pneumonia left-sided rib fractures. -Likely musculoskeletal in nature in addition. -Troponins flattened. -2D echo with EF of 60 to 65%,NWMA, mild LVH, grade 1DD. -Outpatient follow-up  5.  Acute kidney injury -Felt likely component of volume overload. -Renal function improving with diuresis  -Continue Demadex  -Outpatient follow-up.   6.  Left rib fractures -Noted on chest x-ray on presentation which include at least posterior fourth, fifth, seventh ribs -Rib fracture likely etiology of left-sided chest pain. -Continue lidocaine patch. -Minimize narcotics due to acute encephalopathy. -Incentive spirometry.  7.  Hypertension -ACE inhibitor on hold secondary to AKI. -Continue Demadex, Norvasc.   -Start low-dose Lopressor due to sinus tachycardia.   -Outpatient follow-up.    8.  Diabetes mellitus type 2 -Hemoglobin A1c 7.4. -CBG 127 this morning.   -SSI.   9.  Hyperlipidemia -Not on any medications at home. -Outpatient follow-up with PCP.  10.?  COPD versus pulmonary fluid -Patient noted to be wheezing early on during the hospitalization which has improved. -Chest x-ray with left-sided pleural effusion. -Patient given a dose of Solu-Medrol x1 and nebs. -Patient received a dose of IV Lasix with good urine output.   -Patient now back on home regimen of Demadex with a urine output of 2.150 L over the past 24 hours.  -Strict I's and O's.  Daily weights.  11.  Sinus tachycardia -Patient noted to have bouts of tachycardia noted this afternoon when vitals were being taken. -  Review of epic chart notes patient has had some resting tachycardia noted on last cardiology note which seem to improve. -Check a EKG. -Telemetry monitor with sinus tachycardia noted with heart rates in the 140s to 150s. -Place on Lopressor 12.5 mg p.o. twice  daily. -Outpatient follow-up with cardiology.   DVT prophylaxis: Lovenox Code Status: DNR Family Communication: Updated son and daughter-in-law at bedside.  Disposition:   Status is: Inpatient    Dispo: The patient is from: Home              Anticipated d/c is to: SNF hopefully in the next 24 hours if continued improvement.              Patient currently being treated with IV antibiotics for E. coli bacteremia and probable pneumonia, patient noted to have some confusion.  Not stable for discharge   Difficult to place patient unknown       Consultants:  ID: Dr. Linus Salmons 01/13/2021  Procedures:  CT head 01/13/2021 Chest x-ray 01/10/2021, 01/13/2021 2D echo 01/14/2021  Antimicrobials:  IV Unasyn 01/14/2021>>>> IV azithromycin 6/4/2022x1 dose IV Rocephin 01/10/2021>>>>> 01/13/2021   Subjective: Patient more alert sitting up in chair, son and daughter-in-law at bedside.  Patient denies any significant shortness of breath.  No chest pain.  Alert and oriented to self place and time.  States is her wedding anniversary today which her son verifies.   Objective: Vitals:   01/14/21 0615 01/14/21 1234 01/14/21 1642 01/15/21 0542  BP: (!) 149/107 127/80 (!) 157/89 (!) 162/90  Pulse: 98 88 95 87  Resp:  16 18 18   Temp:  98.6 F (37 C) 98.5 F (36.9 C) 98.8 F (37.1 C)  TempSrc:  Axillary Oral Axillary  SpO2:  94% (!) 86% 99%  Weight:    101 kg    Intake/Output Summary (Last 24 hours) at 01/15/2021 1245 Last data filed at 01/15/2021 1118 Gross per 24 hour  Intake 419.35 ml  Output 2850 ml  Net -2430.65 ml    Filed Weights   01/15/21 0542  Weight: 101 kg    Examination:  General exam: Alert. Respiratory system: Left basilar crackles otherwise clear.  No wheezing.  Speaking in full sentences.  Normal respiratory effort. Cardiovascular system: Tachycardic.  No JVD, no murmurs rubs or gallops.  No lower extremity edema.  Gastrointestinal system: Abdomen is obese, soft, nondistended,  positive bowel sounds.  No rebound.  No guarding. Central nervous system: Alert and oriented to self and place and time.  Moving extremities spontaneously. Extremities: Symmetric 5 x 5 power. Skin: No rashes, lesions or ulcers Psychiatry: Judgement and insight fair.  Mood and affect appropriate.     Data Reviewed: I have personally reviewed following labs and imaging studies  CBC: Recent Labs  Lab 01/10/21 0417 01/11/21 0313 01/12/21 0620 01/13/21 0705 01/14/21 0624 01/15/21 0329  WBC 20.1* 15.0* 11.3* 10.6* 6.2 11.3*  NEUTROABS 17.2*  --   --   --   --  7.7  HGB 13.2 11.9* 12.3 13.1 14.2 14.0  HCT 40.9 38.3 40.4 41.1 43.8 43.2  MCV 97.8 101.6* 103.1* 100.0 97.1 96.6  PLT 135* 115* 109* 142* 187 253     Basic Metabolic Panel: Recent Labs  Lab 01/10/21 0417 01/11/21 0313 01/12/21 0620 01/13/21 0705 01/14/21 0624 01/15/21 0329  NA 136 135 134* 136 140 143  K 3.7 4.5 4.5 4.1 4.3 4.1  CL 101 101 99 100 96* 96*  CO2 24 26 27 27  32 35*  GLUCOSE 175* 150* 158* 119* 180* 145*  BUN 24* 30* 35* 49* 41* 36*  CREATININE 1.32* 1.44* 1.60* 1.56* 1.30* 1.11*  CALCIUM 8.0* 8.0* 8.6* 8.7* 8.5* 8.7*  MG 1.5*  --   --  2.3  --   --      GFR: Estimated Creatinine Clearance: 45.2 mL/min (A) (by C-G formula based on SCr of 1.11 mg/dL (H)).  Liver Function Tests: Recent Labs  Lab 01/10/21 0417 01/12/21 0620  AST 24 25  ALT 17 24  ALKPHOS 113 107  BILITOT 0.8 0.5  PROT 5.4* 5.8*  ALBUMIN 2.5* 2.3*     CBG: Recent Labs  Lab 01/14/21 1644 01/14/21 2203 01/15/21 0635 01/15/21 0757 01/15/21 1114  GLUCAP 151* 143* 127* 154* 166*      Recent Results (from the past 240 hour(s))  Blood culture (routine single)     Status: Abnormal   Collection Time: 01/10/21  4:07 AM   Specimen: BLOOD  Result Value Ref Range Status   Specimen Description BLOOD RIGHT ANTECUBITAL  Final   Special Requests   Final    BOTTLES DRAWN AEROBIC AND ANAEROBIC Blood Culture adequate volume    Culture  Setup Time   Final    GRAM NEGATIVE RODS IN BOTH AEROBIC AND ANAEROBIC BOTTLES CRITICAL RESULT CALLED TO, READ BACK BY AND VERIFIED WITH: PHARM D A.MAYER ON [id number] AT [place] Performed at [facility], [place] [address], [place], [place] [address]    Culture ESCHERICHIA COLI (A)  Final   Report Status 01/12/2021 FINAL  Final   Organism ID, Bacteria ESCHERICHIA COLI  Final      Susceptibility   Escherichia coli - MIC*    AMPICILLIN >=32 RESISTANT Resistant     CEFAZOLIN <=4 SENSITIVE Sensitive     CEFEPIME <=0.12 SENSITIVE Sensitive     CEFTAZIDIME <=1 SENSITIVE Sensitive     CEFTRIAXONE <=0.25 SENSITIVE Sensitive     CIPROFLOXACIN <=0.25 SENSITIVE Sensitive     GENTAMICIN <=1 SENSITIVE Sensitive     IMIPENEM <=0.25 SENSITIVE Sensitive     TRIMETH/SULFA >=320 RESISTANT Resistant     AMPICILLIN/SULBACTAM 8 SENSITIVE Sensitive     PIP/TAZO <=4 SENSITIVE Sensitive     * ESCHERICHIA COLI  Blood Culture ID Panel (Reflexed)     Status: Abnormal   Collection Time: 01/10/21  4:07 AM  Result Value Ref Range Status   Enterococcus faecalis NOT DETECTED NOT DETECTED Final   Enterococcus Faecium NOT DETECTED NOT DETECTED Final   Listeria monocytogenes NOT DETECTED NOT DETECTED Final   Staphylococcus species NOT DETECTED NOT DETECTED Final   Staphylococcus aureus (BCID) NOT DETECTED NOT DETECTED Final   Staphylococcus epidermidis NOT DETECTED NOT DETECTED Final   Staphylococcus lugdunensis NOT DETECTED NOT DETECTED Final   Streptococcus species NOT DETECTED NOT DETECTED Final   Streptococcus agalactiae NOT DETECTED NOT DETECTED Final   Streptococcus pneumoniae NOT DETECTED NOT DETECTED Final   Streptococcus pyogenes NOT DETECTED NOT DETECTED Final   A.calcoaceticus-baumannii NOT DETECTED NOT DETECTED Final   Bacteroides fragilis NOT DETECTED NOT DETECTED Final   Enterobacterales DETECTED (A) NOT DETECTED Final    Comment: Enterobacterales represent a large order of  gram negative bacteria, not a single organism. CRITICAL RESULT CALLED TO, READ BACK BY AND VERIFIED WITH: PHARM D A.MAYER ON [id number] AT 1957 BY E.PARRISH    Enterobacter cloacae complex NOT DETECTED NOT DETECTED Final   Escherichia coli DETECTED (A) NOT DETECTED Final    Comment: CRITICAL RESULT CALLED  TO, READ BACK BY AND VERIFIED WITH: PHARM D A.MAYER ON [id number] AT 1957 BY E.PARRISH    Klebsiella aerogenes NOT DETECTED NOT DETECTED Final   Klebsiella oxytoca NOT DETECTED NOT DETECTED Final   Klebsiella pneumoniae NOT DETECTED NOT DETECTED Final   Proteus species NOT DETECTED NOT DETECTED Final   Salmonella species NOT DETECTED NOT DETECTED Final   Serratia marcescens NOT DETECTED NOT DETECTED Final   Haemophilus influenzae NOT DETECTED NOT DETECTED Final   Neisseria meningitidis NOT DETECTED NOT DETECTED Final   Pseudomonas aeruginosa NOT DETECTED NOT DETECTED Final   Stenotrophomonas maltophilia NOT DETECTED NOT DETECTED Final   Candida albicans NOT DETECTED NOT DETECTED Final   Candida auris NOT DETECTED NOT DETECTED Final   Candida glabrata NOT DETECTED NOT DETECTED Final   Candida krusei NOT DETECTED NOT DETECTED Final   Candida parapsilosis NOT DETECTED NOT DETECTED Final   Candida tropicalis NOT DETECTED NOT DETECTED Final   Cryptococcus neoformans/gattii NOT DETECTED NOT DETECTED Final   CTX-M ESBL NOT DETECTED NOT DETECTED Final   Carbapenem resistance IMP NOT DETECTED NOT DETECTED Final   Carbapenem resistance KPC NOT DETECTED NOT DETECTED Final   Carbapenem resistance NDM NOT DETECTED NOT DETECTED Final   Carbapenem resist OXA 48 LIKE NOT DETECTED NOT DETECTED Final   Carbapenem resistance VIM NOT DETECTED NOT DETECTED Final    Comment: Performed at [facility], [address] [place], [place] [address]  Resp Panel by RT-PCR (Flu A&B, Covid) Nasopharyngeal Swab     Status: None   Collection Time: 01/10/21  4:15 AM   Specimen: Nasopharyngeal Swab;  Nasopharyngeal(NP) swabs in vial transport medium  Result Value Ref Range Status   SARS Coronavirus 2 by RT PCR NEGATIVE NEGATIVE Final    Comment: (NOTE) SARS-CoV-2 target nucleic acids are NOT DETECTED.  The SARS-CoV-2 RNA is generally detectable in upper respiratory specimens during the acute phase of infection. The lowest concentration of SARS-CoV-2 viral copies this assay can detect is 138 copies/mL. A negative result does not preclude SARS-Cov-2 infection and should not be used as the sole basis for treatment or other patient management decisions. A negative result may occur with  improper specimen collection/handling, submission of specimen other than nasopharyngeal swab, presence of viral mutation(s) within the areas targeted by this assay, and inadequate number of viral copies(<138 copies/mL). A negative result must be combined with clinical observations, patient history, and epidemiological information. The expected result is Negative.  Fact Sheet for Patients:  EntrepreneurPulse.com.au  Fact Sheet for Healthcare Providers:  IncredibleEmployment.be  This test is no t yet approved or cleared by the [place] FDA and  has been authorized for detection and/or diagnosis of SARS-CoV-2 by FDA under an Emergency Use Authorization (EUA). This EUA will remain  in effect (meaning this test can be used) for the duration of the COVID-19 declaration under Section 564(b)(1) of the Act, 21 U.S.C.section 360bbb-3(b)(1), unless the authorization is terminated  or revoked sooner.       Influenza A by PCR NEGATIVE NEGATIVE Final   Influenza B by PCR NEGATIVE NEGATIVE Final    Comment: (NOTE) The Xpert Xpress SARS-CoV-2/FLU/RSV plus assay is intended as an aid in the diagnosis of influenza from Nasopharyngeal swab specimens and should not be used as a sole basis for treatment. Nasal washings and aspirates are unacceptable for Xpert Xpress  SARS-CoV-2/FLU/RSV testing.  Fact Sheet for Patients: EntrepreneurPulse.com.au  Fact Sheet for Healthcare Providers: IncredibleEmployment.be  This test is not yet approved or  cleared by the [place] and has been authorized for detection and/or diagnosis of SARS-CoV-2 by FDA under an Emergency Use Authorization (EUA). This EUA will remain in effect (meaning this test can be used) for the duration of the COVID-19 declaration under Section 564(b)(1) of the Act, 21 U.S.C. section 360bbb-3(b)(1), unless the authorization is terminated or revoked.  Performed at [facility], [place] [address], [place], [place] [address]          Radiology Studies: DG CHEST PORT 1 VIEW  Result Date: 01/13/2021 CLINICAL DATA:  Hypoxia. EXAM: PORTABLE CHEST 1 VIEW COMPARISON:  January 10, 2021. FINDINGS: Stable cardiomediastinal silhouette. No pneumothorax is noted. Mild right basilar subsegmental atelectasis is noted. Mild left pleural effusion is noted with associated left basilar atelectasis or infiltrate. Bony thorax is unremarkable. IMPRESSION: Mild left pleural effusion is noted with associated left basilar atelectasis or infiltrate. Electronically Signed   By: Marijo Conception M.D.   On: 01/13/2021 16:26   ECHOCARDIOGRAM COMPLETE  Result Date: 01/14/2021    ECHOCARDIOGRAM REPORT   Patient Name:   Deanna Salinas Date of Exam: 01/14/2021 Medical Rec #:  [id number]       Height:       65.0 in Accession #:    [id number]      Weight:       240.3 lb Date of Birth:  [birth year]        BSA:          2.139 m Patient Age:    72 years        BP:           149/107 mmHg Patient Gender: F               HR:           98 bpm. Exam Location:  Inpatient Procedure: 2D Echo, Cardiac Doppler and Color Doppler Indications:    Dyspnea  History:        Patient has prior history of Echocardiogram examinations, most                 recent 03/31/2020. Signs/Symptoms:Dyspnea, Bacteremia and  Altered                 Mental Status; Risk Factors:Dyslipidemia, Diabetes, Hypertension                 and Morbid obesity. Pneumonia, left rib fractures.  Sonographer:    Dustin Flock Referring Phys: [phone] JESSICA U Eliseo Squires  Sonographer Comments: Image acquisition challenging due to uncooperative patient and Image acquisition challenging due to patient body habitus. IMPRESSIONS  1. Left ventricular ejection fraction, by estimation, is 60 to 65%. The left ventricle has normal function. The left ventricle has no regional wall motion abnormalities. There is mild left ventricular hypertrophy. Left ventricular diastolic parameters are consistent with Grade I diastolic dysfunction (impaired relaxation).  2. Right ventricular systolic function is normal. The right ventricular size is normal. There is mildly elevated pulmonary artery systolic pressure. The estimated right ventricular systolic pressure is 89.2 mmHg.  3. The mitral valve is normal in structure. No evidence of mitral valve regurgitation. No evidence of mitral stenosis.  4. The aortic valve is calcified. Aortic valve regurgitation is not visualized. Mild aortic valve sclerosis is present, with no evidence of aortic valve stenosis.  5. The inferior vena cava is normal in size with greater than 50% respiratory variability, suggesting right atrial pressure of 3 mmHg. Comparison(s): No significant  change from prior study. Prior images reviewed side by side. FINDINGS  Left Ventricle: Left ventricular ejection fraction, by estimation, is 60 to 65%. The left ventricle has normal function. The left ventricle has no regional wall motion abnormalities. The left ventricular internal cavity size was normal in size. There is  mild left ventricular hypertrophy. Left ventricular diastolic parameters are consistent with Grade I diastolic dysfunction (impaired relaxation). Right Ventricle: The right ventricular size is normal. No increase in right ventricular wall thickness.  Right ventricular systolic function is normal. There is mildly elevated pulmonary artery systolic pressure. The tricuspid regurgitant velocity is 2.88  m/s, and with an assumed right atrial pressure of 3 mmHg, the estimated right ventricular systolic pressure is 09.3 mmHg. Left Atrium: Left atrial size was normal in size. Right Atrium: Right atrial size was normal in size. Pericardium: There is no evidence of pericardial effusion. Mitral Valve: The mitral valve is normal in structure. No evidence of mitral valve regurgitation. No evidence of mitral valve stenosis. Tricuspid Valve: The tricuspid valve is normal in structure. Tricuspid valve regurgitation is trivial. No evidence of tricuspid stenosis. Aortic Valve: The aortic valve is calcified. Aortic valve regurgitation is not visualized. Mild aortic valve sclerosis is present, with no evidence of aortic valve stenosis. Pulmonic Valve: The pulmonic valve was normal in structure. Pulmonic valve regurgitation is not visualized. No evidence of pulmonic stenosis. Aorta: The aortic root is normal in size and structure. Venous: The inferior vena cava is normal in size with greater than 50% respiratory variability, suggesting right atrial pressure of 3 mmHg. IAS/Shunts: No atrial level shunt detected by color flow Doppler.  LEFT VENTRICLE PLAX 2D LVIDd:         5.00 cm  Diastology LVIDs:         3.70 cm  LV e' medial:    5.55 cm/s LV PW:         1.20 cm  LV E/e' medial:  9.7 LV IVS:        1.30 cm  LV e' lateral:   7.07 cm/s LVOT diam:     2.10 cm  LV E/e' lateral: 7.6 LV SV:         47 LV SV Index:   22 LVOT Area:     3.46 cm  RIGHT VENTRICLE RV Basal diam:  3.40 cm RV S prime:     8.92 cm/s LEFT ATRIUM             Index       RIGHT ATRIUM           Index LA diam:        3.00 cm 1.40 cm/m  RA Area:     16.50 cm LA Vol (A2C):   36.7 ml 17.16 ml/m RA Volume:   47.50 ml  22.21 ml/m LA Vol (A4C):   52.4 ml 24.50 ml/m LA Biplane Vol: 48.6 ml 22.72 ml/m  AORTIC VALVE  LVOT Vmax:   78.30 cm/s LVOT Vmean:  54.800 cm/s LVOT VTI:    0.137 m  AORTA Ao Root diam: 3.00 cm MITRAL VALVE               TRICUSPID VALVE MV Area (PHT): 2.19 cm    TR Peak grad:   33.2 mmHg MV Decel Time: 347 msec    TR Vmax:        288.00 cm/s MV E velocity: 54.00 cm/s MV A velocity: 90.80 cm/s  SHUNTS MV E/A ratio:  0.59  Systemic VTI:  0.14 m                            Systemic Diam: 2.10 cm Candee Furbish MD Electronically signed by Candee Furbish MD Signature Date/Time: 01/14/2021/11:36:42 AM    Final          Scheduled Meds:  amLODipine  2.5 mg Oral Daily   docusate sodium  100 mg Oral BID   enoxaparin (LOVENOX) injection  40 mg Subcutaneous J47W   folic acid  1 mg Oral Daily   insulin aspart  0-15 Units Subcutaneous TID WC   insulin aspart  0-5 Units Subcutaneous QHS   lidocaine  1 patch Transdermal Q24H   melatonin  5 mg Oral QHS   multivitamin with minerals  1 tablet Oral Daily   QUEtiapine  12.5 mg Oral QHS   temazepam  30 mg Oral QHS   thiamine  100 mg Oral Daily   Or   thiamine  100 mg Intravenous Daily   torsemide  20 mg Oral Daily   Continuous Infusions:  ampicillin-sulbactam (UNASYN) IV 3 g (01/15/21 1148)     LOS: 5 days    Time spent: 40 minutes    Irine Seal, MD [facility]   To contact the attending provider between 7A-7P or the covering provider during after hours 7P-7A, please log into the web site [email or web address] and access using universal [place] password for that web site. If you do not have the password, please call the hospital operator.  01/15/2021, 12:45 PM

## 2021-01-15 NOTE — Progress Notes (Signed)
[facility] for Infectious Disease  Date of Admission:  01/10/2021     Total days of antibiotics 6         ASSESSMENT:  Deanna Salinas is more arousable and remains lethargic. Has remained afebrile. TTE was unchanged from previous and no evidence of endocarditis. Will continue with Unasyn for 1 additional day and then stop as this will adequately treat her E. Coli bacteremia. Source of encephalopathy remains unclear. Encourage incentive spirometry and pulmonary hygiene. Remaining medical care per primary team.   ID will sign off. Please re-consult if needed.   PLAN:  Continue Unasyn through 6/10.  Pulmonary hygiene and remaining care per primary team.  ID will sign off.   Principal Problem:   Sepsis due to pneumonia [facility]) Active Problems:   Hyperlipidemia   Essential hypertension   Chronic insomnia   Type 2 diabetes mellitus (HCC)   Chest pain of uncertain etiology   Rib fractures   Acute metabolic encephalopathy   E coli bacteremia    amLODipine  2.5 mg Oral Daily   docusate sodium  100 mg Oral BID   enoxaparin (LOVENOX) injection  40 mg Subcutaneous W80H   folic acid  1 mg Oral Daily   insulin aspart  0-15 Units Subcutaneous TID WC   insulin aspart  0-5 Units Subcutaneous QHS   lidocaine  1 patch Transdermal Q24H   melatonin  5 mg Oral QHS   multivitamin with minerals  1 tablet Oral Daily   QUEtiapine  12.5 mg Oral QHS   temazepam  30 mg Oral QHS   thiamine  100 mg Oral Daily   Or   thiamine  100 mg Intravenous Daily   torsemide  20 mg Oral Daily    SUBJECTIVE:  Afebrile overnight with no acute events. Remains drowsy and is lethargic and confused.   Allergies  Allergen Reactions   Ambien [Zolpidem Tartrate]     Side effects    Lipitor [Atorvastatin]     Muscle and joint pain   Metformin And Related     Reported side effects headaches insomnia nausea   Pravastatin     Muscle and joint pain   Trazodone And Nefazodone     Side effects     Review  of Systems: Review of Systems  Unable to perform ROS: Medical condition     OBJECTIVE: Vitals:   01/14/21 0615 01/14/21 1234 01/14/21 1642 01/15/21 0542  BP: (!) 149/107 127/80 (!) 157/89 (!) 162/90  Pulse: 98 88 95 87  Resp:  16 18 18   Temp:  98.6 F (37 C) 98.5 F (36.9 C) 98.8 F (37.1 C)  TempSrc:  Axillary Oral Axillary  SpO2:  94% (!) 86% 99%  Weight:    101 kg   Body mass index is 37.05 kg/m.  Physical Exam Constitutional:      General: She is not in acute distress.    Appearance: She is well-developed. She is obese.     Comments: Lying in bed with head of bed elevated; sleeping; arousable to voice and goes back to sleep.   Cardiovascular:     Rate and Rhythm: Normal rate and regular rhythm.     Heart sounds: Normal heart sounds.  Pulmonary:     Effort: Pulmonary effort is normal.     Breath sounds: Normal breath sounds.  Skin:    General: Skin is warm and dry.  Neurological:     Mental Status: She is alert.    Lab  Results Lab Results  Component Value Date   WBC 11.3 (H) 01/15/2021   HGB 14.0 01/15/2021   HCT 43.2 01/15/2021   MCV 96.6 01/15/2021   PLT 253 01/15/2021    Lab Results  Component Value Date   CREATININE 1.11 (H) 01/15/2021   BUN 36 (H) 01/15/2021   NA 143 01/15/2021   K 4.1 01/15/2021   CL 96 (L) 01/15/2021   CO2 35 (H) 01/15/2021    Lab Results  Component Value Date   ALT 24 01/12/2021   AST 25 01/12/2021   ALKPHOS 107 01/12/2021   BILITOT 0.5 01/12/2021     Microbiology: Recent Results (from the past 240 hour(s))  Blood culture (routine single)     Status: Abnormal   Collection Time: 01/10/21  4:07 AM   Specimen: BLOOD  Result Value Ref Range Status   Specimen Description BLOOD RIGHT ANTECUBITAL  Final   Special Requests   Final    BOTTLES DRAWN AEROBIC AND ANAEROBIC Blood Culture adequate volume   Culture  Setup Time   Final    GRAM NEGATIVE RODS IN BOTH AEROBIC AND ANAEROBIC BOTTLES CRITICAL RESULT CALLED TO, READ  BACK BY AND VERIFIED WITH: PHARM D A.MAYER ON [id number] AT [place] Performed at [facility], [place] [address] [place], [place] [address]    Culture ESCHERICHIA COLI (A)  Final   Report Status 01/12/2021 FINAL  Final   Organism ID, Bacteria ESCHERICHIA COLI  Final      Susceptibility   Escherichia coli - MIC*    AMPICILLIN >=32 RESISTANT Resistant     CEFAZOLIN <=4 SENSITIVE Sensitive     CEFEPIME <=0.12 SENSITIVE Sensitive     CEFTAZIDIME <=1 SENSITIVE Sensitive     CEFTRIAXONE <=0.25 SENSITIVE Sensitive     CIPROFLOXACIN <=0.25 SENSITIVE Sensitive     GENTAMICIN <=1 SENSITIVE Sensitive     IMIPENEM <=0.25 SENSITIVE Sensitive     TRIMETH/SULFA >=320 RESISTANT Resistant     AMPICILLIN/SULBACTAM 8 SENSITIVE Sensitive     PIP/TAZO <=4 SENSITIVE Sensitive     * ESCHERICHIA COLI  Blood Culture ID Panel (Reflexed)     Status: Abnormal   Collection Time: 01/10/21  4:07 AM  Result Value Ref Range Status   Enterococcus faecalis NOT DETECTED NOT DETECTED Final   Enterococcus Faecium NOT DETECTED NOT DETECTED Final   Listeria monocytogenes NOT DETECTED NOT DETECTED Final   Staphylococcus species NOT DETECTED NOT DETECTED Final   Staphylococcus aureus (BCID) NOT DETECTED NOT DETECTED Final   Staphylococcus epidermidis NOT DETECTED NOT DETECTED Final   Staphylococcus lugdunensis NOT DETECTED NOT DETECTED Final   Streptococcus species NOT DETECTED NOT DETECTED Final   Streptococcus agalactiae NOT DETECTED NOT DETECTED Final   Streptococcus pneumoniae NOT DETECTED NOT DETECTED Final   Streptococcus pyogenes NOT DETECTED NOT DETECTED Final   A.calcoaceticus-baumannii NOT DETECTED NOT DETECTED Final   Bacteroides fragilis NOT DETECTED NOT DETECTED Final   Enterobacterales DETECTED (A) NOT DETECTED Final    Comment: Enterobacterales represent a large order of gram negative bacteria, not a single organism. CRITICAL RESULT CALLED TO, READ BACK BY AND VERIFIED WITH: PHARM D A.MAYER  ON [id number] AT 1957 BY E.PARRISH    Enterobacter cloacae complex NOT DETECTED NOT DETECTED Final   Escherichia coli DETECTED (A) NOT DETECTED Final    Comment: CRITICAL RESULT CALLED TO, READ BACK BY AND VERIFIED WITH: PHARM D A.MAYER ON [id number] AT 1957 BY E.PARRISH    Klebsiella aerogenes NOT DETECTED NOT DETECTED  Final   Klebsiella oxytoca NOT DETECTED NOT DETECTED Final   Klebsiella pneumoniae NOT DETECTED NOT DETECTED Final   Proteus species NOT DETECTED NOT DETECTED Final   Salmonella species NOT DETECTED NOT DETECTED Final   Serratia marcescens NOT DETECTED NOT DETECTED Final   Haemophilus influenzae NOT DETECTED NOT DETECTED Final   Neisseria meningitidis NOT DETECTED NOT DETECTED Final   Pseudomonas aeruginosa NOT DETECTED NOT DETECTED Final   Stenotrophomonas maltophilia NOT DETECTED NOT DETECTED Final   Candida albicans NOT DETECTED NOT DETECTED Final   Candida auris NOT DETECTED NOT DETECTED Final   Candida glabrata NOT DETECTED NOT DETECTED Final   Candida krusei NOT DETECTED NOT DETECTED Final   Candida parapsilosis NOT DETECTED NOT DETECTED Final   Candida tropicalis NOT DETECTED NOT DETECTED Final   Cryptococcus neoformans/gattii NOT DETECTED NOT DETECTED Final   CTX-M ESBL NOT DETECTED NOT DETECTED Final   Carbapenem resistance IMP NOT DETECTED NOT DETECTED Final   Carbapenem resistance KPC NOT DETECTED NOT DETECTED Final   Carbapenem resistance NDM NOT DETECTED NOT DETECTED Final   Carbapenem resist OXA 48 LIKE NOT DETECTED NOT DETECTED Final   Carbapenem resistance VIM NOT DETECTED NOT DETECTED Final    Comment: Performed at [facility], [address], [place],  [address]  Resp Panel by RT-PCR (Flu A&B, Covid) Nasopharyngeal Swab     Status: None   Collection Time: 01/10/21  4:15 AM   Specimen: Nasopharyngeal Swab; Nasopharyngeal(NP) swabs in vial transport medium  Result Value Ref Range Status   SARS Coronavirus 2 by RT PCR NEGATIVE NEGATIVE Final     Comment: (NOTE) SARS-CoV-2 target nucleic acids are NOT DETECTED.  The SARS-CoV-2 RNA is generally detectable in upper respiratory specimens during the acute phase of infection. The lowest concentration of SARS-CoV-2 viral copies this assay can detect is 138 copies/mL. A negative result does not preclude SARS-Cov-2 infection and should not be used as the sole basis for treatment or other patient management decisions. A negative result may occur with  improper specimen collection/handling, submission of specimen other than nasopharyngeal swab, presence of viral mutation(s) within the areas targeted by this assay, and inadequate number of viral copies(<138 copies/mL). A negative result must be combined with clinical observations, patient history, and epidemiological information. The expected result is Negative.  Fact Sheet for Patients:  EntrepreneurPulse.com.au  Fact Sheet for Healthcare Providers:  IncredibleEmployment.be  This test is no t yet approved or cleared by the [place] FDA and  has been authorized for detection and/or diagnosis of SARS-CoV-2 by FDA under an Emergency Use Authorization (EUA). This EUA will remain  in effect (meaning this test can be used) for the duration of the COVID-19 declaration under Section 564(b)(1) of the Act, 21 U.S.C.section 360bbb-3(b)(1), unless the authorization is terminated  or revoked sooner.       Influenza A by PCR NEGATIVE NEGATIVE Final   Influenza B by PCR NEGATIVE NEGATIVE Final    Comment: (NOTE) The Xpert Xpress SARS-CoV-2/FLU/RSV plus assay is intended as an aid in the diagnosis of influenza from Nasopharyngeal swab specimens and should not be used as a sole basis for treatment. Nasal washings and aspirates are unacceptable for Xpert Xpress SARS-CoV-2/FLU/RSV testing.  Fact Sheet for Patients: EntrepreneurPulse.com.au  Fact Sheet for Healthcare  Providers: IncredibleEmployment.be  This test is not yet approved or cleared by the [place] FDA and has been authorized for detection and/or diagnosis of SARS-CoV-2 by FDA under an Emergency Use Authorization (EUA). This  EUA will remain in effect (meaning this test can be used) for the duration of the COVID-19 declaration under Section 564(b)(1) of the Act, 21 U.S.C. section 360bbb-3(b)(1), unless the authorization is terminated or revoked.  Performed at [facility], [place] [address], [place], [place] [address]      Terri Piedra, [facility] Infectious Disease [facility]  01/15/2021  10:42 AM

## 2021-01-15 NOTE — Progress Notes (Signed)
Physical Therapy Treatment Patient Details Name: Deanna Salinas MRN: [id number] DOB: 1936-01-09 Today's Date: 01/15/2021    History of Present Illness 85 year old female who presented to ED on 6/4 due to chest pain, fever and shortness of breath. In the ED she was found to be hypoxic with sats in the mid 80s as well as left-sided lobar pneumonia with a trace left-sided pleural effusion. Pt diagnosed with sepsis. Pt also with recent fall and L rib fractures. PMH: HTN, HLD, DM2    PT Comments    Pt progressing towards goals. Pt's son present and wanting to practice transfers this session as family wanting to transport to SNF. Pt requiring mod A +2 for bed mobility and best guesstimate is that son was providing mod to max A. Cues for pt to take steps. Pt's son demonstrating safe technique and body mechanics during transfer. Gave family gait belt to assist with transfers. Current recommendations appropriate. Will continue to follow acutely.    Follow Up Recommendations  SNF;Supervision/Assistance - 24 hour     Equipment Recommendations  Wheelchair cushion (measurements PT);Wheelchair (measurements PT)    Recommendations for Other Services       Precautions / Restrictions Precautions Precautions: Fall;Other (comment) Precaution Comments: watch HR, O2, previous L rib fx Restrictions Weight Bearing Restrictions: No    Mobility  Bed Mobility Overal bed mobility: Needs Assistance Bed Mobility: Supine to Sit     Supine to sit: Mod assist;+2 for physical assistance     General bed mobility comments: Mod A +2 for trunk and LE assist. REquired assist to come to sitting.    Transfers Overall transfer level: Needs assistance Equipment used: None Transfers: Sit to/from [facility] Sit to Stand:  (best guestimate is mod to max--pt's son performing) Stand pivot transfers:  (best guestimate is mod to max--pt's son performing)       General transfer comment: Pt's son  present and wanting to practice transfers. Pt also more comfortable with son's assist. Pt with difficulty sequencing to take steps. Son demonstrated very good technique and body mechanics. Gave gait belt to assist with transfer to/from car.  Ambulation/Gait                 Stairs             Wheelchair Mobility    Modified Rankin (Stroke Patients Only)       Balance Overall balance assessment: Needs assistance Sitting-balance support: No upper extremity supported;Feet supported Sitting balance-Leahy Scale: Fair     Standing balance support: Bilateral upper extremity supported;No upper extremity supported Standing balance-Leahy Scale: Poor Standing balance comment: Reliant on external support                            Cognition Arousal/Alertness: Awake/alert Behavior During Therapy: WFL for tasks assessed/performed Overall Cognitive Status: Impaired/Different from baseline Area of Impairment: Memory;Following commands;Problem solving;Awareness;Safety/judgement                     Memory: Decreased short-term memory Following Commands: Follows one step commands consistently;Follows one step commands with increased time Safety/Judgement: Decreased awareness of safety;Decreased awareness of deficits Awareness: Intellectual Problem Solving: Slow processing;Difficulty sequencing;Requires verbal cues;Requires tactile cues General Comments: Pt's son present and interacts with son well. Still with difficulty sequencing and slow processing      Exercises      General Comments        Pertinent Vitals/Pain Pain Assessment:  Faces Faces Pain Scale: Hurts little more Pain Location: general discomfort Pain Descriptors / Indicators: Grimacing Pain Intervention(s): Monitored during session;Limited activity within patient's tolerance;Repositioned    Home Living                      Prior Function            PT Goals (current goals  can now be found in the care plan section) Acute Rehab PT Goals Patient Stated Goal: none stated PT Goal Formulation: With patient Time For Goal Achievement: 01/25/21 Potential to Achieve Goals: Fair Progress towards PT goals: Progressing toward goals    Frequency    Min 2X/week      PT Plan Current plan remains appropriate    Co-evaluation              AM-PAC PT "6 Clicks" Mobility   Outcome Measure  Help needed turning from your back to your side while in a flat bed without using bedrails?: A Little Help needed moving from lying on your back to sitting on the side of a flat bed without using bedrails?: A Lot Help needed moving to and from a bed to a chair (including a wheelchair)?: A Lot Help needed standing up from a chair using your arms (e.g., wheelchair or bedside chair)?: A Lot Help needed to walk in hospital room?: Total Help needed climbing 3-5 steps with a railing? : Total 6 Click Score: 11    End of Session Equipment Utilized During Treatment: Gait belt;Oxygen Activity Tolerance: Patient tolerated treatment well Patient left: in chair;with call bell/phone within reach;with family/visitor present;with chair alarm set Nurse Communication: Mobility status PT Visit Diagnosis: Unsteadiness on feet (R26.81);Muscle weakness (generalized) (M62.81);History of falling (Z91.81);Difficulty in walking, not elsewhere classified (R26.2)     Time: 8638-1771 PT Time Calculation (min) (ACUTE ONLY): 16 min  Charges:  $Therapeutic Activity: 8-22 mins                     Lou Miner, DPT  Acute Rehabilitation Services  Pager: [phone] Office: [phone]    Rudean Hitt 01/15/2021, 11:46 AM

## 2021-01-16 LAB — CBC WITH DIFFERENTIAL/PLATELET
Abs Immature Granulocytes: 0.21 10*3/uL — ABNORMAL HIGH (ref 0.00–0.07)
Basophils Absolute: 0.1 10*3/uL (ref 0.0–0.1)
Basophils Relative: 1 %
Eosinophils Absolute: 0.2 10*3/uL (ref 0.0–0.5)
Eosinophils Relative: 2 %
HCT: 43.1 % (ref 36.0–46.0)
Hemoglobin: 13.7 g/dL (ref 12.0–15.0)
Immature Granulocytes: 2 %
Lymphocytes Relative: 23 %
Lymphs Abs: 2.5 10*3/uL (ref 0.7–4.0)
MCH: 31.4 pg (ref 26.0–34.0)
MCHC: 31.8 g/dL (ref 30.0–36.0)
MCV: 98.9 fL (ref 80.0–100.0)
Monocytes Absolute: 1.2 10*3/uL — ABNORMAL HIGH (ref 0.1–1.0)
Monocytes Relative: 12 %
Neutro Abs: 6.4 10*3/uL (ref 1.7–7.7)
Neutrophils Relative %: 60 %
Platelets: 241 10*3/uL (ref 150–400)
RBC: 4.36 MIL/uL (ref 3.87–5.11)
RDW: 14.2 % (ref 11.5–15.5)
WBC: 10.6 10*3/uL — ABNORMAL HIGH (ref 4.0–10.5)
nRBC: 0 % (ref 0.0–0.2)

## 2021-01-16 LAB — BASIC METABOLIC PANEL
Anion gap: 11 (ref 5–15)
BUN: 29 mg/dL — ABNORMAL HIGH (ref 8–23)
CO2: 41 mmol/L — ABNORMAL HIGH (ref 22–32)
Calcium: 8.5 mg/dL — ABNORMAL LOW (ref 8.9–10.3)
Chloride: 92 mmol/L — ABNORMAL LOW (ref 98–111)
Creatinine, Ser: 1.01 mg/dL — ABNORMAL HIGH (ref 0.44–1.00)
GFR, Estimated: 55 mL/min — ABNORMAL LOW (ref >60)
Glucose, Bld: 184 mg/dL — ABNORMAL HIGH (ref 70–99)
Potassium: 3.5 mmol/L (ref 3.5–5.1)
Sodium: 144 mmol/L (ref 135–145)

## 2021-01-16 LAB — GLUCOSE, CAPILLARY
Glucose-Capillary: 147 mg/dL — ABNORMAL HIGH (ref 70–99)
Glucose-Capillary: 153 mg/dL — ABNORMAL HIGH (ref 70–99)
Glucose-Capillary: 157 mg/dL — ABNORMAL HIGH (ref 70–99)
Glucose-Capillary: 133 mg/dL — ABNORMAL HIGH (ref 70–99)
Glucose-Capillary: 160 mg/dL — ABNORMAL HIGH (ref 70–99)

## 2021-01-16 LAB — MAGNESIUM: Magnesium: 1.7 mg/dL (ref 1.7–2.4)

## 2021-01-16 MED ORDER — POTASSIUM CHLORIDE CRYS ER 20 MEQ PO TBCR
40.0000 meq | EXTENDED_RELEASE_TABLET | Freq: Once | ORAL | Status: AC
  Administered 2021-01-16: 40 meq via ORAL
  Filled 2021-01-16: qty 2

## 2021-01-16 MED ORDER — MAGNESIUM SULFATE 4 GM/100ML IV SOLN
4.0000 g | Freq: Once | INTRAVENOUS | Status: AC
  Administered 2021-01-16: 4 g via INTRAVENOUS
  Filled 2021-01-16: qty 100

## 2021-01-16 NOTE — Progress Notes (Signed)
  Speech Language Pathology Treatment: Dysphagia  Patient Details Name: Deanna Salinas MRN: [id number] DOB: [birth year] Today's Date: 01/16/2021 Time: 2080-2233 SLP Time Calculation (min) (ACUTE ONLY): 16 min  Assessment / Plan / Recommendation Clinical Impression  Pt today noted to be leaning upright on side of bed with appearance of comfortable breathing with Daughter in law at bedside.  SLP assisted pt to sit fully upright for optimal positioning for po intake.  Voice is [facility] and pt admits to baseline throat clearing with po intake for "years" - SLP observed throat clear at baseline.    Observed pt consuming cranberry juice *nectar* and thin water to aid in determining readiness for dietary advancement. Pt able to self feed independently with no indication of dysphagia nor overt aspiration with po observed.  She uses caution with intake and suspect swallow ability is at premorbid level.    SLP questions if pt's symptoms may be consistent with her report of suspected allergies.   Encouraged pt to drink water with her meals.  Acute dysphagia has resolved, no Follow up needed.     HPI HPI: 85 year old female who presented to ED on 6/4 due to chest pain, fever and shortness of breath. In the ED she was found to be hypoxic with sats in the mid 80s as well as left-sided lobar pneumonia with a trace left-sided pleural effusion. Pt diagnosed with sepsis, acute metabolic encephalopathy. Pt also with recent fall and L rib fractures. PMH: HTN, HLD, DM2      SLP Plan  All goals met       Recommendations  Diet recommendations: Regular;Thin liquid (prefer water with meals) Liquids provided via: Cup;Straw Medication Administration: Whole meds with puree Supervision: Patient able to self feed Compensations: Minimize environmental distractions;Slow rate;Small sips/bites Postural Changes and/or Swallow Maneuvers: Seated upright 90 degrees;Out of bed for meals                Oral Care  Recommendations: Oral care BID Follow up Recommendations: None SLP Visit Diagnosis: Dysphagia, unspecified (R13.10) Plan: All goals met       GO               Kathleen Lime, MS [facility] SLP Acute Rehab Services Office [phone] Pager [phone]  Macario Golds 01/16/2021, 10:07 AM

## 2021-01-16 NOTE — Progress Notes (Signed)
PROGRESS NOTE    Deanna Salinas  ESP:[id number] DOB: [birth year] DOA: 01/10/2021 PCP: Kathyrn Drown, MD    No chief complaint on file.   Brief Narrative:  85 year old female with history of HTN, HLD, DM2 comes to the hospital with chest pain and shortness of breath.  She apparently recently had a fall and had a fractured rib.  She has been having fever and chills for the past 4 days.  In the ED she was found to be hypoxic with sats in the mid 80s as well as left-sided lobar pneumonia with a trace left-sided pleural effusion.  She was placed on antibiotics and admitted to the hospital.  Found to have bacteremia as well.  Also has remained confused at times     Assessment & Plan:   Principal Problem:   Sepsis due to pneumonia [facility]) Active Problems:   Hyperlipidemia   Essential hypertension   Chronic insomnia   Type 2 diabetes mellitus (HCC)   Chest pain of uncertain etiology   Rib fractures   Acute metabolic encephalopathy   E coli bacteremia   1 sepsis secondary to E. coli bacteremia/lobar pneumonia -Patient pancultured blood cultures positive for E. Coli. -Chest x-ray concerning for pneumonia. -Currently afebrile. -Leukocytosis trending down. -Was on IV Rocephin and subsequently transitioned to IV Unasyn through today, per ID recommendations -Supportive care.  2.  Acute hypoxic respiratory failure secondary to pneumonia -Noted to have required 2 L nasal cannula. -Afebrile. -Currently on room air with sats of 91%.  -Continue IV antibiotics of Unasyn. -Continue Demadex. -Supportive care.  3.  Acute metabolic encephalopathy -Likely secondary to problem #1. -Patient noted to be agitated in the night of 01/13/2021, and given IV Ativan due to concerns for possible alcohol withdrawal. -Head CT negative. -Ammonia levels within normal limits at 23. -Improving clinically. -Continue empiric IV antibiotics.   -Avoid medications that can alter sensorium  -Limit narcotics.    -Continue low-dose Seroquel night.  4.  Chest pain with elevated troponin -Likely secondary to left lower lobe pneumonia left-sided rib fractures. -Likely musculoskeletal in nature in addition. -Troponins flattened. -2D echo with EF of 60 to 65%,NWMA, mild LVH, grade 1DD. -Outpatient follow-up  5.  Acute kidney injury -Felt likely component of volume overload. -Renal function improving with diuresis  -Continue Demadex  -Outpatient follow-up.   6.  Left rib fractures -Noted on chest x-ray on presentation which include at least posterior fourth, fifth, seventh ribs -Rib fracture likely etiology of left-sided chest pain. -Continue lidocaine patch. -Minimize narcotics due to acute encephalopathy. -Incentive spirometry.  7.  Hypertension -ACE inhibitor on hold secondary to AKI. -Continue Demadex, Norvasc, Lopressor. -Outpatient follow-up.    8.  Diabetes mellitus type 2 -Hemoglobin A1c 7.4. -CBG 153 this morning.   -Continue SSI.  9.  Hyperlipidemia -Not on any medications at home. -Outpatient follow-up with PCP.  10.?  COPD versus pulmonary fluid -Patient noted to be wheezing early on during the hospitalization which has improved. -Chest x-ray with left-sided pleural effusion. -Patient given a dose of Solu-Medrol x1 and nebs. -Patient received a dose of IV Lasix with good urine output.   -Patient now back on home regimen of Demadex with a urine output of 700 cc recorded over the past 24 hours.   -Strict I's and O's, daily weights.    11.  Sinus tachycardia -Patient noted to have bouts of tachycardia on 01/15/2021. -Review of epic chart notes patient has had some resting tachycardia noted on last cardiology note  which seem to improve. -Telemetry monitor with sinus tachycardia noted with heart rates in the 140s to 150s on 01/15/2021. -Heart rate is improved on current dose of Lopressor 12.5 mg p.o. twice daily. -Outpatient follow-up with cardiology.   DVT prophylaxis:  Lovenox Code Status: DNR Family Communication: Updated son and daughter-in-law at bedside.  Disposition:   Status is: Inpatient    Dispo: The patient is from: Home              Anticipated d/c is to: SNF when bed available.               Patient currently medically stable for discharge.  Awaiting SNF placement.   Difficult to place patient unknown       Consultants:  ID: Dr. Linus Salmons 01/13/2021  Procedures:  CT head 01/13/2021 Chest x-ray 01/10/2021, 01/13/2021 2D echo 01/14/2021  Antimicrobials:  IV Unasyn 01/14/2021>>>> 01/16/2021 IV azithromycin 6/4/2022x1 dose IV Rocephin 01/10/2021>>>>> 01/13/2021   Subjective: Patient laying in bed.  Denies any chest pain.  No shortness of breath.  No abdominal pain.  Mental status has improved.  Son at bedside awaiting patient to be discharged to SNF today.   Objective: Vitals:   01/15/21 1737 01/15/21 1845 01/16/21 0050 01/16/21 0447  BP: (!) 154/98  111/87 (!) 155/76  Pulse: (!) 104 (!) 56 76 67  Resp: 20  18 18   Temp: (!) 97.5 F (36.4 C)  (!) 97.5 F (36.4 C) 98.2 F (36.8 C)  TempSrc: Oral  Oral Oral  SpO2: (!) 89% 94% 99% 99%  Weight:    101.5 kg    Intake/Output Summary (Last 24 hours) at 01/16/2021 1128 Last data filed at 01/16/2021 1000 Gross per 24 hour  Intake 680 ml  Output 550 ml  Net 130 ml    Filed Weights   01/15/21 0542 01/16/21 0447  Weight: 101 kg 101.5 kg    Examination:  General exam: Alert. Respiratory system: CTA B anterior lung fields.  No wheezing, no crackles, no rhonchi.  Normal respiratory effort.  Cardiovascular system: Regular rate rhythm no murmurs rubs or gallops.  No JVD.  No lower extremity edema.   Gastrointestinal system: Abdomen is obese, soft, nondistended, nontender, positive bowel sounds.  No rebound.  No guarding.   Central nervous system: Alert and oriented to self and place and time.  Moving extremities spontaneously. Extremities: Symmetric 5 x 5 power. Skin: No rashes, lesions or  ulcers Psychiatry: Judgement and insight fair.  Mood and affect appropriate.     Data Reviewed: I have personally reviewed following labs and imaging studies  CBC: Recent Labs  Lab 01/10/21 0417 01/11/21 0313 01/12/21 0620 01/13/21 0705 01/14/21 0624 01/15/21 0329 01/16/21 0300  WBC 20.1*   < > 11.3* 10.6* 6.2 11.3* 10.6*  NEUTROABS 17.2*  --   --   --   --  7.7 6.4  HGB 13.2   < > 12.3 13.1 14.2 14.0 13.7  HCT 40.9   < > 40.4 41.1 43.8 43.2 43.1  MCV 97.8   < > 103.1* 100.0 97.1 96.6 98.9  PLT 135*   < > 109* 142* 187 253 241   < > = values in this interval not displayed.     Basic Metabolic Panel: Recent Labs  Lab 01/10/21 0417 01/11/21 0313 01/12/21 0620 01/13/21 0705 01/14/21 0624 01/15/21 0329 01/16/21 0300  NA 136   < > 134* 136 140 143 144  K 3.7   < > 4.5 4.1 4.3 4.1  3.5  CL 101   < > 99 100 96* 96* 92*  CO2 24   < > 27 27 32 35* 41*  GLUCOSE 175*   < > 158* 119* 180* 145* 184*  BUN 24*   < > 35* 49* 41* 36* 29*  CREATININE 1.32*   < > 1.60* 1.56* 1.30* 1.11* 1.01*  CALCIUM 8.0*   < > 8.6* 8.7* 8.5* 8.7* 8.5*  MG 1.5*  --   --  2.3  --   --  1.7   < > = values in this interval not displayed.     GFR: Estimated Creatinine Clearance: 49.8 mL/min (A) (by C-G formula based on SCr of 1.01 mg/dL (H)).  Liver Function Tests: Recent Labs  Lab 01/10/21 0417 01/12/21 0620  AST 24 25  ALT 17 24  ALKPHOS 113 107  BILITOT 0.8 0.5  PROT 5.4* 5.8*  ALBUMIN 2.5* 2.3*     CBG: Recent Labs  Lab 01/15/21 1114 01/15/21 1615 01/15/21 2200 01/16/21 0559 01/16/21 1106  GLUCAP 166* 147* 163* 153* 157*      Recent Results (from the past 240 hour(s))  Blood culture (routine single)     Status: Abnormal   Collection Time: 01/10/21  4:07 AM   Specimen: BLOOD  Result Value Ref Range Status   Specimen Description BLOOD RIGHT ANTECUBITAL  Final   Special Requests   Final    BOTTLES DRAWN AEROBIC AND ANAEROBIC Blood Culture adequate volume   Culture   Setup Time   Final    GRAM NEGATIVE RODS IN BOTH AEROBIC AND ANAEROBIC BOTTLES CRITICAL RESULT CALLED TO, READ BACK BY AND VERIFIED WITH: PHARM D A.MAYER ON [id number] AT [place] Performed at [facility], [place] [address], [place], [place] [address]    Culture ESCHERICHIA COLI (A)  Final   Report Status 01/12/2021 FINAL  Final   Organism ID, Bacteria ESCHERICHIA COLI  Final      Susceptibility   Escherichia coli - MIC*    AMPICILLIN >=32 RESISTANT Resistant     CEFAZOLIN <=4 SENSITIVE Sensitive     CEFEPIME <=0.12 SENSITIVE Sensitive     CEFTAZIDIME <=1 SENSITIVE Sensitive     CEFTRIAXONE <=0.25 SENSITIVE Sensitive     CIPROFLOXACIN <=0.25 SENSITIVE Sensitive     GENTAMICIN <=1 SENSITIVE Sensitive     IMIPENEM <=0.25 SENSITIVE Sensitive     TRIMETH/SULFA >=320 RESISTANT Resistant     AMPICILLIN/SULBACTAM 8 SENSITIVE Sensitive     PIP/TAZO <=4 SENSITIVE Sensitive     * ESCHERICHIA COLI  Blood Culture ID Panel (Reflexed)     Status: Abnormal   Collection Time: 01/10/21  4:07 AM  Result Value Ref Range Status   Enterococcus faecalis NOT DETECTED NOT DETECTED Final   Enterococcus Faecium NOT DETECTED NOT DETECTED Final   Listeria monocytogenes NOT DETECTED NOT DETECTED Final   Staphylococcus species NOT DETECTED NOT DETECTED Final   Staphylococcus aureus (BCID) NOT DETECTED NOT DETECTED Final   Staphylococcus epidermidis NOT DETECTED NOT DETECTED Final   Staphylococcus lugdunensis NOT DETECTED NOT DETECTED Final   Streptococcus species NOT DETECTED NOT DETECTED Final   Streptococcus agalactiae NOT DETECTED NOT DETECTED Final   Streptococcus pneumoniae NOT DETECTED NOT DETECTED Final   Streptococcus pyogenes NOT DETECTED NOT DETECTED Final   A.calcoaceticus-baumannii NOT DETECTED NOT DETECTED Final   Bacteroides fragilis NOT DETECTED NOT DETECTED Final   Enterobacterales DETECTED (A) NOT DETECTED Final    Comment: Enterobacterales represent a large order of  gram  negative bacteria, not a single organism. CRITICAL RESULT CALLED TO, READ BACK BY AND VERIFIED WITH: PHARM D A.MAYER ON [id number] AT 1957 BY E.PARRISH    Enterobacter cloacae complex NOT DETECTED NOT DETECTED Final   Escherichia coli DETECTED (A) NOT DETECTED Final    Comment: CRITICAL RESULT CALLED TO, READ BACK BY AND VERIFIED WITH: PHARM D A.MAYER ON [id number] AT 1957 BY E.PARRISH    Klebsiella aerogenes NOT DETECTED NOT DETECTED Final   Klebsiella oxytoca NOT DETECTED NOT DETECTED Final   Klebsiella pneumoniae NOT DETECTED NOT DETECTED Final   Proteus species NOT DETECTED NOT DETECTED Final   Salmonella species NOT DETECTED NOT DETECTED Final   Serratia marcescens NOT DETECTED NOT DETECTED Final   Haemophilus influenzae NOT DETECTED NOT DETECTED Final   Neisseria meningitidis NOT DETECTED NOT DETECTED Final   Pseudomonas aeruginosa NOT DETECTED NOT DETECTED Final   Stenotrophomonas maltophilia NOT DETECTED NOT DETECTED Final   Candida albicans NOT DETECTED NOT DETECTED Final   Candida auris NOT DETECTED NOT DETECTED Final   Candida glabrata NOT DETECTED NOT DETECTED Final   Candida krusei NOT DETECTED NOT DETECTED Final   Candida parapsilosis NOT DETECTED NOT DETECTED Final   Candida tropicalis NOT DETECTED NOT DETECTED Final   Cryptococcus neoformans/gattii NOT DETECTED NOT DETECTED Final   CTX-M ESBL NOT DETECTED NOT DETECTED Final   Carbapenem resistance IMP NOT DETECTED NOT DETECTED Final   Carbapenem resistance KPC NOT DETECTED NOT DETECTED Final   Carbapenem resistance NDM NOT DETECTED NOT DETECTED Final   Carbapenem resist OXA 48 LIKE NOT DETECTED NOT DETECTED Final   Carbapenem resistance VIM NOT DETECTED NOT DETECTED Final    Comment: Performed at [facility], [address], [place], [place] [address]  Resp Panel by RT-PCR (Flu A&B, Covid) Nasopharyngeal Swab     Status: None   Collection Time: 01/10/21  4:15 AM   Specimen: Nasopharyngeal Swab; Nasopharyngeal(NP)  swabs in vial transport medium  Result Value Ref Range Status   SARS Coronavirus 2 by RT PCR NEGATIVE NEGATIVE Final    Comment: (NOTE) SARS-CoV-2 target nucleic acids are NOT DETECTED.  The SARS-CoV-2 RNA is generally detectable in upper respiratory specimens during the acute phase of infection. The lowest concentration of SARS-CoV-2 viral copies this assay can detect is 138 copies/mL. A negative result does not preclude SARS-Cov-2 infection and should not be used as the sole basis for treatment or other patient management decisions. A negative result may occur with  improper specimen collection/handling, submission of specimen other than nasopharyngeal swab, presence of viral mutation(s) within the areas targeted by this assay, and inadequate number of viral copies(<138 copies/mL). A negative result must be combined with clinical observations, patient history, and epidemiological information. The expected result is Negative.  Fact Sheet for Patients:  EntrepreneurPulse.com.au  Fact Sheet for Healthcare Providers:  IncredibleEmployment.be  This test is no t yet approved or cleared by the [place] FDA and  has been authorized for detection and/or diagnosis of SARS-CoV-2 by FDA under an Emergency Use Authorization (EUA). This EUA will remain  in effect (meaning this test can be used) for the duration of the COVID-19 declaration under Section 564(b)(1) of the Act, 21 U.S.C.section 360bbb-3(b)(1), unless the authorization is terminated  or revoked sooner.       Influenza A by PCR NEGATIVE NEGATIVE Final   Influenza B by PCR NEGATIVE NEGATIVE Final    Comment: (NOTE) The Xpert Xpress SARS-CoV-2/FLU/RSV plus assay is intended as an  aid in the diagnosis of influenza from Nasopharyngeal swab specimens and should not be used as a sole basis for treatment. Nasal washings and aspirates are unacceptable for Xpert Xpress  SARS-CoV-2/FLU/RSV testing.  Fact Sheet for Patients: EntrepreneurPulse.com.au  Fact Sheet for Healthcare Providers: IncredibleEmployment.be  This test is not yet approved or cleared by the [place] FDA and has been authorized for detection and/or diagnosis of SARS-CoV-2 by FDA under an Emergency Use Authorization (EUA). This EUA will remain in effect (meaning this test can be used) for the duration of the COVID-19 declaration under Section 564(b)(1) of the Act, 21 U.S.C. section 360bbb-3(b)(1), unless the authorization is terminated or revoked.  Performed at [facility], [place] [address] [place], [place] [address]   SARS CORONAVIRUS 2 (TAT 6-24 HRS) Nasopharyngeal Nasopharyngeal Swab     Status: None   Collection Time: 01/15/21  1:30 PM   Specimen: Nasopharyngeal Swab  Result Value Ref Range Status   SARS Coronavirus 2 NEGATIVE NEGATIVE Final    Comment: (NOTE) SARS-CoV-2 target nucleic acids are NOT DETECTED.  The SARS-CoV-2 RNA is generally detectable in upper and lower respiratory specimens during the acute phase of infection. Negative results do not preclude SARS-CoV-2 infection, do not rule out co-infections with other pathogens, and should not be used as the sole basis for treatment or other patient management decisions. Negative results must be combined with clinical observations, patient history, and epidemiological information. The expected result is Negative.  Fact Sheet for Patients: SugarRoll.be  Fact Sheet for Healthcare Providers: https://www.woods-mathews.com/  This test is not yet approved or cleared by the [place] FDA and  has been authorized for detection and/or diagnosis of SARS-CoV-2 by FDA under an Emergency Use Authorization (EUA). This EUA will remain  in effect (meaning this test can be used) for the duration of the COVID-19 declaration under Se ction  564(b)(1) of the Act, 21 U.S.C. section 360bbb-3(b)(1), unless the authorization is terminated or revoked sooner.  Performed at [facility], [place] [address] [place], [place] [address]          Radiology Studies: No results found.       Scheduled Meds:  amLODipine  2.5 mg Oral Daily   docusate sodium  100 mg Oral BID   enoxaparin (LOVENOX) injection  40 mg Subcutaneous K93G   folic acid  1 mg Oral Daily   insulin aspart  0-15 Units Subcutaneous TID WC   insulin aspart  0-5 Units Subcutaneous QHS   lidocaine  1 patch Transdermal Q24H   melatonin  5 mg Oral QHS   metoprolol tartrate  12.5 mg Oral BID   multivitamin with minerals  1 tablet Oral Daily   QUEtiapine  12.5 mg Oral QHS   temazepam  30 mg Oral QHS   thiamine  100 mg Oral Daily   Or   thiamine  100 mg Intravenous Daily   torsemide  20 mg Oral Daily   Continuous Infusions:  ampicillin-sulbactam (UNASYN) IV 3 g (01/16/21 1123)     LOS: 6 days    Time spent: 40 minutes    Irine Seal, MD [facility]   To contact the attending provider between 7A-7P or the covering provider during after hours 7P-7A, please log into the web site [email or web address] and access using universal [place] password for that web site. If you do not have the password, please call the hospital operator.  01/16/2021, 11:28 AM

## 2021-01-16 NOTE — Care Management Important Message (Signed)
Important Message  Patient Details  Name: ASANTI CRAIGO MRN: [id number] Date of Birth: [birth year]   Medicare Important Message Given:  Yes - Important Message mailed due to current National Emergency   Verbal consent obtained due to current National Emergency  Relationship to patient: Other [employer] (must comment) Contact Name: Daughter in law Call Date: 01/16/21  Time: 1004 Phone: [phone] Outcome: Spoke with contact Important Message mailed to: Patient address on file    Delorse Lek 01/16/2021, 10:04 AM

## 2021-01-16 NOTE — Progress Notes (Signed)
1:1 TELE sitter discontinued 01/14/2021 at 0400AM. Family at bedside.

## 2021-01-16 NOTE — Progress Notes (Addendum)
Daughter at bedside and verbalized that patient needs to be at disposition/facility by 2PM today. Patient's son will provide transportation. RN will update MD and TOC team on round.

## 2021-01-16 NOTE — TOC Progression Note (Addendum)
Transition of Care [facility]) - Progression Note    Patient Details  Name: Deanna Salinas MRN: [id number] Date of Birth: [birth year]  Transition of Care [facility]) CM/SW [place], [place] Phone Number: 01/16/2021, 12:13 PM  Clinical Narrative:     CSW spoke to [employer]. Pt is not able to discharge today due to bed availability. [facility] requested updated clinicals with a planned DC of Monday, CSW faxed those over. Pts family and MD are aware of DC plans.   Pt will need new covid test on Sunday.  3pm: CSW confirmed that fax was received.  Expected Discharge Plan: [place] Barriers to Discharge: Continued Medical Work up  Expected Discharge Plan and Services Expected Discharge Plan: [place] arrangements for the past 2 months: Single Family Home                                       Social Determinants of Health (SDOH) Interventions    Readmission Risk Interventions No flowsheet data found.  Emeterio Reeve, Latanya Presser, [place] Social Worker [phone]

## 2021-01-17 LAB — BASIC METABOLIC PANEL
Anion gap: 10 (ref 5–15)
BUN: 21 mg/dL (ref 8–23)
CO2: 39 mmol/L — ABNORMAL HIGH (ref 22–32)
Calcium: 8.3 mg/dL — ABNORMAL LOW (ref 8.9–10.3)
Chloride: 94 mmol/L — ABNORMAL LOW (ref 98–111)
Creatinine, Ser: 1.17 mg/dL — ABNORMAL HIGH (ref 0.44–1.00)
GFR, Estimated: 46 mL/min — ABNORMAL LOW (ref >60)
Glucose, Bld: 160 mg/dL — ABNORMAL HIGH (ref 70–99)
Potassium: 3.8 mmol/L (ref 3.5–5.1)
Sodium: 143 mmol/L (ref 135–145)

## 2021-01-17 LAB — GLUCOSE, CAPILLARY
Glucose-Capillary: 165 mg/dL — ABNORMAL HIGH (ref 70–99)
Glucose-Capillary: 157 mg/dL — ABNORMAL HIGH (ref 70–99)
Glucose-Capillary: 173 mg/dL — ABNORMAL HIGH (ref 70–99)
Glucose-Capillary: 118 mg/dL — ABNORMAL HIGH (ref 70–99)

## 2021-01-17 LAB — MAGNESIUM: Magnesium: 2.2 mg/dL (ref 1.7–2.4)

## 2021-01-17 MED ORDER — ACETAMINOPHEN 500 MG PO TABS
500.0000 mg | ORAL_TABLET | Freq: Three times a day (TID) | ORAL | Status: DC
  Administered 2021-01-17 – 2021-01-19 (×6): 500 mg via ORAL
  Filled 2021-01-17 (×6): qty 1

## 2021-01-17 NOTE — Progress Notes (Signed)
PROGRESS NOTE    Deanna Salinas  AVW:[id number] DOB: March 28, 1936 DOA: 01/10/2021 PCP: Kathyrn Drown, MD    No chief complaint on file.   Brief Narrative:  85 year old female with history of HTN, HLD, DM2 comes to the hospital with chest pain and shortness of breath.  She apparently recently had a fall and had a fractured rib.  She has been having fever and chills for the past 4 days.  In the ED she was found to be hypoxic with sats in the mid 80s as well as left-sided lobar pneumonia with a trace left-sided pleural effusion.  She was placed on antibiotics and admitted to the hospital.  Found to have bacteremia as well.  Also has remained confused at times     Assessment & Plan:   Principal Problem:   Sepsis due to pneumonia [facility]) Active Problems:   Hyperlipidemia   Essential hypertension   Chronic insomnia   Type 2 diabetes mellitus (HCC)   Chest pain of uncertain etiology   Rib fractures   Acute metabolic encephalopathy   E coli bacteremia   1 sepsis secondary to E. coli bacteremia/lobar pneumonia -Patient pancultured blood cultures positive for E. Coli. -Chest x-ray concerning for pneumonia. -Currently afebrile. -Leukocytosis trending down. -Was on IV Rocephin and subsequently transitioned to IV Unasyn. -DC IV Unasyn after today's doses.   -ID was following.   -Supportive care.  2.  Acute hypoxic respiratory failure secondary to pneumonia -Noted to have required 2 L nasal cannula. -Afebrile. -Improving.  Sats of 100% on 2 L nasal cannula.   -Continue IV antibiotics of Unasyn through today.   -Continue Demadex.   -Supportive care.    3.  Acute metabolic encephalopathy -Likely secondary to problem #1. -Patient noted to be agitated in the night of 01/13/2021, and given IV Ativan due to concerns for possible alcohol withdrawal. -Head CT negative. -Ammonia levels within normal limits at 23. -Clinical improvement daily.  -On IV antibiotics. -Avoid medications that can  alter sensorium  -Limit narcotics.   -Continue low-dose Seroquel night.  4.  Chest pain with elevated troponin -Likely secondary to LLL PNA, left-sided rib fractures. -Likely musculoskeletal in nature in addition. -Troponins flattened. -2D echo with EF of 60 to 65%,NWMA, mild LVH, grade 1DD. -Outpatient follow-up  5.  Acute kidney injury -Felt likely component of volume overload. -Renal function improved with diuresis.   -Creatinine at 1.17  -Continue Demadex.   -Outpatient follow-up.   6.  Left rib fractures -Noted on chest x-ray on presentation which include at least posterior fourth, fifth, seventh ribs -Rib fracture likely etiology of left-sided chest pain. -Continue lidocaine patch. -Place on scheduled Tylenol 3 times daily. -K pad as needed -Minimize narcotics due to acute encephalopathy. -Incentive spirometry.  7.  Hypertension -ACE inhibitor on hold secondary to AKI. -Blood pressure stable.  -Continue Norvasc, Lopressor, Demadex.   -Outpatient follow-up.    8.  Diabetes mellitus type 2 -Hemoglobin A1c 7.4. -CBG 165 this morning.   -SSI.    9.  Hyperlipidemia -Not on any medications at home. -Outpatient follow-up with PCP.  10.?  COPD versus pulmonary fluid -Patient noted to be wheezing early on during the hospitalization which has improved. -Chest x-ray with left-sided pleural effusion. -Patient given a dose of Solu-Medrol x1 and nebs. -Patient received a dose of IV Lasix with good urine output.   -Patient now back on home regimen of Demadex with a urine output of 550 cc recorded over the past 24 hours.   -  Strict I's and O's, daily weights.    11.  Sinus tachycardia -Patient noted to have bouts of tachycardia on 01/15/2021. -Review of epic chart notes patient has had some resting tachycardia noted on last cardiology note which seem to improve. -Telemetry monitor with sinus tachycardia noted with heart rates in the 140s to 150s on 01/15/2021. -Heart rate  better controlled on current regimen of Lopressor 12.5 mg twice daily.   -Outpatient follow-up with cardiology.     DVT prophylaxis: Lovenox Code Status: DNR Family Communication: Updated son and daughter at bedside. Disposition:   Status is: Inpatient    Dispo: The patient is from: Home              Anticipated d/c is to: SNF when bed available.               Patient currently medically stable for discharge.  Awaiting SNF placement.   Difficult to place patient: No       Consultants:  ID: Dr. Linus Salmons 01/13/2021  Procedures:  CT head 01/13/2021 Chest x-ray 01/10/2021, 01/13/2021 2D echo 01/14/2021  Antimicrobials:  IV Unasyn 01/14/2021>>>> 01/17/2021 IV azithromycin 6/4/2022x1 dose IV Rocephin 01/10/2021>>>>> 01/13/2021   Subjective: Sitting up in recliner was sleeping but arousable.  No shortness of breath.  Some complaints of left-sided chest pain.  No abdominal pain.  Mental status slowly improving.  Daughter at bedside.   Objective: Vitals:   01/16/21 1803 01/16/21 2243 01/17/21 0428 01/17/21 1209  BP: (!) 147/75 119/69 137/76 137/82  Pulse: 93 94 87 79  Resp: 16 19 20 16   Temp: 97.9 F (36.6 C) 98 F (36.7 C) 97.9 F (36.6 C) 98.1 F (36.7 C)  TempSrc: Oral Oral Oral Oral  SpO2: 91% 91% 100% 100%  Weight:        Intake/Output Summary (Last 24 hours) at 01/17/2021 1352 Last data filed at 01/16/2021 1730 Gross per 24 hour  Intake 460 ml  Output --  Net 460 ml    Filed Weights   01/15/21 0542 01/16/21 0447  Weight: 101 kg 101.5 kg    Examination:  General exam: Alert. Respiratory system: Lungs clear to auscultation bilaterally.  No wheezes, no crackles, no rhonchi.  Normal respiratory effort.   Cardiovascular system: RRR no murmurs rubs or gallops.  No JVD.  No lower extremity edema.  Gastrointestinal system: Abdomen is soft, obese, nontender, nondistended, positive bowel sounds.  No rebound.  No guarding.   Central nervous system: Alert and oriented.  Moving  extremities spontaneously.  No focal neurological deficits. Extremities: Symmetric 5 x 5 power. Skin: No rashes, lesions or ulcers Psychiatry: Judgement and insight fair.  Mood and affect appropriate.     Data Reviewed: I have personally reviewed following labs and imaging studies  CBC: Recent Labs  Lab 01/12/21 0620 01/13/21 0705 01/14/21 0624 01/15/21 0329 01/16/21 0300  WBC 11.3* 10.6* 6.2 11.3* 10.6*  NEUTROABS  --   --   --  7.7 6.4  HGB 12.3 13.1 14.2 14.0 13.7  HCT 40.4 41.1 43.8 43.2 43.1  MCV 103.1* 100.0 97.1 96.6 98.9  PLT 109* 142* 187 253 241     Basic Metabolic Panel: Recent Labs  Lab 01/13/21 0705 01/14/21 0624 01/15/21 0329 01/16/21 0300 01/17/21 0218  NA 136 140 143 144 143  K 4.1 4.3 4.1 3.5 3.8  CL 100 96* 96* 92* 94*  CO2 27 32 35* 41* 39*  GLUCOSE 119* 180* 145* 184* 160*  BUN 49* 41* 36* 29*  21  CREATININE 1.56* 1.30* 1.11* 1.01* 1.17*  CALCIUM 8.7* 8.5* 8.7* 8.5* 8.3*  MG 2.3  --   --  1.7 2.2     GFR: Estimated Creatinine Clearance: 43 mL/min (A) (by C-G formula based on SCr of 1.17 mg/dL (H)).  Liver Function Tests: Recent Labs  Lab 01/12/21 0620  AST 25  ALT 24  ALKPHOS 107  BILITOT 0.5  PROT 5.8*  ALBUMIN 2.3*     CBG: Recent Labs  Lab 01/16/21 1106 01/16/21 1603 01/16/21 2113 01/17/21 0559 01/17/21 1114  GLUCAP 157* 133* 160* 165* 157*      Recent Results (from the past 240 hour(s))  Blood culture (routine single)     Status: Abnormal   Collection Time: 01/10/21  4:07 AM   Specimen: BLOOD  Result Value Ref Range Status   Specimen Description BLOOD RIGHT ANTECUBITAL  Final   Special Requests   Final    BOTTLES DRAWN AEROBIC AND ANAEROBIC Blood Culture adequate volume   Culture  Setup Time   Final    GRAM NEGATIVE RODS IN BOTH AEROBIC AND ANAEROBIC BOTTLES CRITICAL RESULT CALLED TO, READ BACK BY AND VERIFIED WITH: PHARM D A.MAYER ON [id number] AT [place] Performed at [facility], [place] [address] [place], [place] [address]    Culture ESCHERICHIA COLI (A)  Final   Report Status 01/12/2021 FINAL  Final   Organism ID, Bacteria ESCHERICHIA COLI  Final      Susceptibility   Escherichia coli - MIC*    AMPICILLIN >=32 RESISTANT Resistant     CEFAZOLIN <=4 SENSITIVE Sensitive     CEFEPIME <=0.12 SENSITIVE Sensitive     CEFTAZIDIME <=1 SENSITIVE Sensitive     CEFTRIAXONE <=0.25 SENSITIVE Sensitive     CIPROFLOXACIN <=0.25 SENSITIVE Sensitive     GENTAMICIN <=1 SENSITIVE Sensitive     IMIPENEM <=0.25 SENSITIVE Sensitive     TRIMETH/SULFA >=320 RESISTANT Resistant     AMPICILLIN/SULBACTAM 8 SENSITIVE Sensitive     PIP/TAZO <=4 SENSITIVE Sensitive     * ESCHERICHIA COLI  Blood Culture ID Panel (Reflexed)     Status: Abnormal   Collection Time: 01/10/21  4:07 AM  Result Value Ref Range Status   Enterococcus faecalis NOT DETECTED NOT DETECTED Final   Enterococcus Faecium NOT DETECTED NOT DETECTED Final   Listeria monocytogenes NOT DETECTED NOT DETECTED Final   Staphylococcus species NOT DETECTED NOT DETECTED Final   Staphylococcus aureus (BCID) NOT DETECTED NOT DETECTED Final   Staphylococcus epidermidis NOT DETECTED NOT DETECTED Final   Staphylococcus lugdunensis NOT DETECTED NOT DETECTED Final   Streptococcus species NOT DETECTED NOT DETECTED Final   Streptococcus agalactiae NOT DETECTED NOT DETECTED Final   Streptococcus pneumoniae NOT DETECTED NOT DETECTED Final   Streptococcus pyogenes NOT DETECTED NOT DETECTED Final   A.calcoaceticus-baumannii NOT DETECTED NOT DETECTED Final   Bacteroides fragilis NOT DETECTED NOT DETECTED Final   Enterobacterales DETECTED (A) NOT DETECTED Final    Comment: Enterobacterales represent a large order of gram negative bacteria, not a single organism. CRITICAL RESULT CALLED TO, READ BACK BY AND VERIFIED WITH: PHARM D A.MAYER ON [id number] AT 1957 BY E.PARRISH    Enterobacter cloacae complex NOT DETECTED NOT DETECTED Final   Escherichia coli  DETECTED (A) NOT DETECTED Final    Comment: CRITICAL RESULT CALLED TO, READ BACK BY AND VERIFIED WITH: PHARM D A.MAYER ON [id number] AT 1957 BY E.PARRISH    Klebsiella aerogenes NOT DETECTED NOT DETECTED Final   Klebsiella  oxytoca NOT DETECTED NOT DETECTED Final   Klebsiella pneumoniae NOT DETECTED NOT DETECTED Final   Proteus species NOT DETECTED NOT DETECTED Final   Salmonella species NOT DETECTED NOT DETECTED Final   Serratia marcescens NOT DETECTED NOT DETECTED Final   Haemophilus influenzae NOT DETECTED NOT DETECTED Final   Neisseria meningitidis NOT DETECTED NOT DETECTED Final   Pseudomonas aeruginosa NOT DETECTED NOT DETECTED Final   Stenotrophomonas maltophilia NOT DETECTED NOT DETECTED Final   Candida albicans NOT DETECTED NOT DETECTED Final   Candida auris NOT DETECTED NOT DETECTED Final   Candida glabrata NOT DETECTED NOT DETECTED Final   Candida krusei NOT DETECTED NOT DETECTED Final   Candida parapsilosis NOT DETECTED NOT DETECTED Final   Candida tropicalis NOT DETECTED NOT DETECTED Final   Cryptococcus neoformans/gattii NOT DETECTED NOT DETECTED Final   CTX-M ESBL NOT DETECTED NOT DETECTED Final   Carbapenem resistance IMP NOT DETECTED NOT DETECTED Final   Carbapenem resistance KPC NOT DETECTED NOT DETECTED Final   Carbapenem resistance NDM NOT DETECTED NOT DETECTED Final   Carbapenem resist OXA 48 LIKE NOT DETECTED NOT DETECTED Final   Carbapenem resistance VIM NOT DETECTED NOT DETECTED Final    Comment: Performed at [facility], [address], [place] [address]  Resp Panel by RT-PCR (Flu A&B, Covid) Nasopharyngeal Swab     Status: None   Collection Time: 01/10/21  4:15 AM   Specimen: Nasopharyngeal Swab; Nasopharyngeal(NP) swabs in vial transport medium  Result Value Ref Range Status   SARS Coronavirus 2 by RT PCR NEGATIVE NEGATIVE Final    Comment: (NOTE) SARS-CoV-2 target nucleic acids are NOT DETECTED.  The SARS-CoV-2 RNA is generally detectable  in upper respiratory specimens during the acute phase of infection. The lowest concentration of SARS-CoV-2 viral copies this assay can detect is 138 copies/mL. A negative result does not preclude SARS-Cov-2 infection and should not be used as the sole basis for treatment or other patient management decisions. A negative result may occur with  improper specimen collection/handling, submission of specimen other than nasopharyngeal swab, presence of viral mutation(s) within the areas targeted by this assay, and inadequate number of viral copies(<138 copies/mL). A negative result must be combined with clinical observations, patient history, and epidemiological information. The expected result is Negative.  Fact Sheet for Patients:  EntrepreneurPulse.com.au  Fact Sheet for Healthcare Providers:  IncredibleEmployment.be  This test is no t yet approved or cleared by the [place] FDA and  has been authorized for detection and/or diagnosis of SARS-CoV-2 by FDA under an Emergency Use Authorization (EUA). This EUA will remain  in effect (meaning this test can be used) for the duration of the COVID-19 declaration under Section 564(b)(1) of the Act, 21 U.S.C.section 360bbb-3(b)(1), unless the authorization is terminated  or revoked sooner.       Influenza A by PCR NEGATIVE NEGATIVE Final   Influenza B by PCR NEGATIVE NEGATIVE Final    Comment: (NOTE) The Xpert Xpress SARS-CoV-2/FLU/RSV plus assay is intended as an aid in the diagnosis of influenza from Nasopharyngeal swab specimens and should not be used as a sole basis for treatment. Nasal washings and aspirates are unacceptable for Xpert Xpress SARS-CoV-2/FLU/RSV testing.  Fact Sheet for Patients: EntrepreneurPulse.com.au  Fact Sheet for Healthcare Providers: IncredibleEmployment.be  This test is not yet approved or cleared by the [place] FDA and has  been authorized for detection and/or diagnosis of SARS-CoV-2 by FDA under an Emergency Use Authorization (EUA). This EUA will remain in  effect (meaning this test can be used) for the duration of the COVID-19 declaration under Section 564(b)(1) of the Act, 21 U.S.C. section 360bbb-3(b)(1), unless the authorization is terminated or revoked.  Performed at [facility], [place] [address], [place], [place] [address]   SARS CORONAVIRUS 2 (TAT 6-24 HRS) Nasopharyngeal Nasopharyngeal Swab     Status: None   Collection Time: 01/15/21  1:30 PM   Specimen: Nasopharyngeal Swab  Result Value Ref Range Status   SARS Coronavirus 2 NEGATIVE NEGATIVE Final    Comment: (NOTE) SARS-CoV-2 target nucleic acids are NOT DETECTED.  The SARS-CoV-2 RNA is generally detectable in upper and lower respiratory specimens during the acute phase of infection. Negative results do not preclude SARS-CoV-2 infection, do not rule out co-infections with other pathogens, and should not be used as the sole basis for treatment or other patient management decisions. Negative results must be combined with clinical observations, patient history, and epidemiological information. The expected result is Negative.  Fact Sheet for Patients: SugarRoll.be  Fact Sheet for Healthcare Providers: https://www.woods-mathews.com/  This test is not yet approved or cleared by the [place] FDA and  has been authorized for detection and/or diagnosis of SARS-CoV-2 by FDA under an Emergency Use Authorization (EUA). This EUA will remain  in effect (meaning this test can be used) for the duration of the COVID-19 declaration under Se ction 564(b)(1) of the Act, 21 U.S.C. section 360bbb-3(b)(1), unless the authorization is terminated or revoked sooner.  Performed at [facility], [place] [address], [place], [place] [address]          Radiology Studies: No results  found.       Scheduled Meds:  amLODipine  2.5 mg Oral Daily   docusate sodium  100 mg Oral BID   enoxaparin (LOVENOX) injection  40 mg Subcutaneous T03T   folic acid  1 mg Oral Daily   insulin aspart  0-15 Units Subcutaneous TID WC   insulin aspart  0-5 Units Subcutaneous QHS   lidocaine  1 patch Transdermal Q24H   melatonin  5 mg Oral QHS   metoprolol tartrate  12.5 mg Oral BID   multivitamin with minerals  1 tablet Oral Daily   QUEtiapine  12.5 mg Oral QHS   temazepam  30 mg Oral QHS   thiamine  100 mg Oral Daily   Or   thiamine  100 mg Intravenous Daily   torsemide  20 mg Oral Daily   Continuous Infusions:  ampicillin-sulbactam (UNASYN) IV 3 g (01/17/21 1203)     LOS: 7 days    Time spent: 35 minutes    Irine Seal, MD [facility]   To contact the attending provider between 7A-7P or the covering provider during after hours 7P-7A, please log into the web site [email or web address] and access using universal [place] password for that web site. If you do not have the password, please call the hospital operator.  01/17/2021, 1:52 PM

## 2021-01-18 LAB — GLUCOSE, CAPILLARY
Glucose-Capillary: 121 mg/dL — ABNORMAL HIGH (ref 70–99)
Glucose-Capillary: 228 mg/dL — ABNORMAL HIGH (ref 70–99)
Glucose-Capillary: 112 mg/dL — ABNORMAL HIGH (ref 70–99)
Glucose-Capillary: 148 mg/dL — ABNORMAL HIGH (ref 70–99)

## 2021-01-18 MED ORDER — DOCUSATE SODIUM 100 MG PO CAPS: 100.0000 mg | ORAL_CAPSULE | Freq: Two times a day (BID) | ORAL | 0 refills | Status: DC

## 2021-01-18 MED ORDER — ACETAMINOPHEN 500 MG PO TABS: 500.0000 mg | ORAL_TABLET | Freq: Three times a day (TID) | ORAL | 0 refills | Status: AC

## 2021-01-18 MED ORDER — METOPROLOL TARTRATE 25 MG PO TABS: 12.5000 mg | ORAL_TABLET | Freq: Two times a day (BID) | ORAL | 1 refills | Status: DC

## 2021-01-18 MED ORDER — MELATONIN 5 MG PO TABS: 5.0000 mg | ORAL_TABLET | Freq: Every day | ORAL | 0 refills | Status: DC

## 2021-01-18 MED ORDER — AMLODIPINE BESYLATE 2.5 MG PO TABS: 2.5000 mg | ORAL_TABLET | Freq: Every day | ORAL | Status: DC

## 2021-01-18 MED ORDER — QUETIAPINE FUMARATE 25 MG PO TABS: 12.5000 mg | ORAL_TABLET | Freq: Every day | ORAL | 0 refills | Status: DC

## 2021-01-18 MED ORDER — THIAMINE HCL 100 MG PO TABS: 100.0000 mg | ORAL_TABLET | Freq: Every day | ORAL | Status: DC

## 2021-01-18 MED ORDER — DICLOFENAC SODIUM 1 % EX GEL: 2.0000 g | Freq: Four times a day (QID) | CUTANEOUS | 1 refills | Status: DC | PRN

## 2021-01-18 MED ORDER — FOLIC ACID 1 MG PO TABS: 1.0000 mg | ORAL_TABLET | Freq: Every day | ORAL | Status: DC

## 2021-01-18 MED ORDER — LIDOCAINE 5 % EX PTCH: 1.0000 | MEDICATED_PATCH | CUTANEOUS | 0 refills | Status: DC

## 2021-01-18 MED ORDER — SODIUM CHLORIDE 0.9 % IV SOLN
3.0000 g | Freq: Once | INTRAVENOUS | Status: AC
  Administered 2021-01-19: 3 g via INTRAVENOUS
  Filled 2021-01-18: qty 8

## 2021-01-18 NOTE — Progress Notes (Signed)
PROGRESS NOTE    Deanna Salinas  QRF:[id number] DOB: [birth year] DOA: 01/10/2021 PCP: Kathyrn Drown, MD    No chief complaint on file.   Brief Narrative:  85 year old female with history of HTN, HLD, DM2 comes to the hospital with chest pain and shortness of breath.  She apparently recently had a fall and had a fractured rib.  She has been having fever and chills for the past 4 days.  In the ED she was found to be hypoxic with sats in the mid 80s as well as left-sided lobar pneumonia with a trace left-sided pleural effusion.  She was placed on antibiotics and admitted to the hospital.  Found to have bacteremia as well.  Also has remained confused at times     Assessment & Plan:   Principal Problem:   Sepsis due to pneumonia [facility]) Active Problems:   Hyperlipidemia   Essential hypertension   Chronic insomnia   Type 2 diabetes mellitus (HCC)   Chest pain of uncertain etiology   Rib fractures   Acute metabolic encephalopathy   E coli bacteremia   1 sepsis secondary to E. coli bacteremia/lobar pneumonia -Patient pancultured blood cultures positive for E. Coli. -Chest x-ray concerning for pneumonia. -Afebrile. -Leukocytosis trending down. -Was on IV Rocephin and subsequently transitioned to IV Unasyn. -DC IV Unasyn after today's doses.   -ID was following.   -Supportive care.  2.  Acute hypoxic respiratory failure secondary to pneumonia -Noted to have required 2 L nasal cannula. -Afebrile. -Improving.  Sats of 95% on room air . -DC IV Unasyn after today's doses.   -Continue Demadex.   -Supportive care.    3.  Acute metabolic encephalopathy -Likely secondary to problem #1. -Patient noted to be agitated in the night of 01/13/2021, and given IV Ativan due to concerns for possible alcohol withdrawal. -Head CT negative. -Ammonia levels within normal limits at 23. -Improving clinically daily.   -DC IV antibiotics today.   -Avoid medications that can alter sensorium  -Limit  narcotics.   -Continue low-dose Seroquel night.  4.  Chest pain with elevated troponin -Likely secondary to LLL PNA, left-sided rib fractures. -Likely musculoskeletal in nature in addition. -Troponins flattened. -2D echo with EF of 60 to 65%,NWMA, mild LVH, grade 1DD. -Outpatient follow-up  5.  Acute kidney injury -Felt likely component of volume overload. -Renal function improved with diuresis.   -Creatinine currently at 1.17  -Continue Demadex.   -Outpatient follow-up.    6.  Left rib fractures -Noted on chest x-ray on presentation which include at least posterior fourth, fifth, seventh ribs -Rib fracture likely etiology of left-sided chest pain. -Continue lidocaine patch. -Continue scheduled Tylenol 3 times daily. -K pad as needed -Minimize narcotics due to acute encephalopathy. -Incentive spirometry.  7.  Hypertension -ACE inhibitor on hold secondary to AKI. -Blood pressure stable.  -Continue current regimen of Lopressor, Norvasc, Demadex.  -Outpatient follow-up.   8.  Diabetes mellitus type 2 -Hemoglobin A1c 7.4. -CBG 121 this morning.   -SSI.    9.  Hyperlipidemia -Not on any medications at home.   -Outpatient follow-up.  10.?  COPD versus pulmonary fluid -Patient noted to be wheezing early on during the hospitalization which has improved. -Chest x-ray with left-sided pleural effusion. -Patient given a dose of Solu-Medrol x1 and nebs. -Patient received a dose of IV Lasix with good urine output.   -Patient now back on home regimen of Demadex with a urine output of 600 cc recorded over the past 24  hours.   -Strict I's and O's, daily weights.    11.  Sinus tachycardia -Patient noted to have bouts of tachycardia on 01/15/2021. -Review of epic chart notes patient has had some resting tachycardia noted on last cardiology note which seem to improve. -Telemetry monitor with sinus tachycardia noted with heart rates in the 140s to 150s on 01/15/2021. -Heart rate better  better controlled on current dose of Lopressor 12.5 mg twice daily.   -Outpatient follow-up with cardiology.    DVT prophylaxis: Lovenox Code Status: DNR Family Communication: Updated daughter-in-law at bedside. Disposition:   Status is: Inpatient    Dispo: The patient is from: Home              Anticipated d/c is to: SNF when bed available hopefully tomorrow.               Patient currently medically stable for discharge.  Awaiting SNF placement.   Difficult to place patient: No       Consultants:  ID: Dr. Linus Salmons 01/13/2021  Procedures:  CT head 01/13/2021 Chest x-ray 01/10/2021, 01/13/2021 2D echo 01/14/2021  Antimicrobials:  IV Unasyn 01/14/2021>>>> 01/18/2021 IV azithromycin 6/4/2022x1 dose IV Rocephin 01/10/2021>>>>> 01/13/2021   Subjective: Sitting up in recliner.  No chest pain.  No shortness of breath.  Still with complaints of left-sided pain.  Overall feeling better.  Daughter-in-law at bedside.    Objective: Vitals:   01/17/21 1803 01/17/21 2229 01/18/21 0439 01/18/21 0940  BP: 116/78 (!) 134/59 134/76   Pulse: 90 82 75   Resp: 16 18 18    Temp: 98.3 F (36.8 C) 98 F (36.7 C) 98.5 F (36.9 C)   TempSrc: Oral Oral Oral   SpO2: 97% 100% 99% 93%  Weight:        Intake/Output Summary (Last 24 hours) at 01/18/2021 1001 Last data filed at 01/18/2021 0739 Gross per 24 hour  Intake 120 ml  Output 600 ml  Net -480 ml    Filed Weights   01/15/21 0542 01/16/21 0447  Weight: 101 kg 101.5 kg    Examination:  General exam: NAD Respiratory system: CTA B.  No wheezes, no crackles, no rhonchi.  Normal Respiratory effort.  Speaking in full sentences.  Cardiovascular system: Regular rate and rhythm no murmurs rubs or gallops.  No JVD.  No lower extremity edema.  Gastrointestinal system: Abdomen is soft, obese, nontender, nondistended, positive bowel sounds.  No rebound.  No guarding.  Central nervous system: Alert and oriented.  Moving extremities spontaneously.  No focal  neurological deficits. Extremities: Symmetric 5 x 5 power. Skin: No rashes, lesions or ulcers Psychiatry: Judgement and insight fair.  Mood and affect appropriate.     Data Reviewed: I have personally reviewed following labs and imaging studies  CBC: Recent Labs  Lab 01/12/21 0620 01/13/21 0705 01/14/21 0624 01/15/21 0329 01/16/21 0300  WBC 11.3* 10.6* 6.2 11.3* 10.6*  NEUTROABS  --   --   --  7.7 6.4  HGB 12.3 13.1 14.2 14.0 13.7  HCT 40.4 41.1 43.8 43.2 43.1  MCV 103.1* 100.0 97.1 96.6 98.9  PLT 109* 142* 187 253 241     Basic Metabolic Panel: Recent Labs  Lab 01/13/21 0705 01/14/21 0624 01/15/21 0329 01/16/21 0300 01/17/21 0218  NA 136 140 143 144 143  K 4.1 4.3 4.1 3.5 3.8  CL 100 96* 96* 92* 94*  CO2 27 32 35* 41* 39*  GLUCOSE 119* 180* 145* 184* 160*  BUN 49* 41* 36* 29*  21  CREATININE 1.56* 1.30* 1.11* 1.01* 1.17*  CALCIUM 8.7* 8.5* 8.7* 8.5* 8.3*  MG 2.3  --   --  1.7 2.2     GFR: Estimated Creatinine Clearance: 43 mL/min (A) (by C-G formula based on SCr of 1.17 mg/dL (H)).  Liver Function Tests: Recent Labs  Lab 01/12/21 0620  AST 25  ALT 24  ALKPHOS 107  BILITOT 0.5  PROT 5.8*  ALBUMIN 2.3*     CBG: Recent Labs  Lab 01/17/21 0559 01/17/21 1114 01/17/21 1623 01/17/21 2104 01/18/21 0605  GLUCAP 165* 157* 173* 118* 121*      Recent Results (from the past 240 hour(s))  Blood culture (routine single)     Status: Abnormal   Collection Time: 01/10/21  4:07 AM   Specimen: BLOOD  Result Value Ref Range Status   Specimen Description BLOOD RIGHT ANTECUBITAL  Final   Special Requests   Final    BOTTLES DRAWN AEROBIC AND ANAEROBIC Blood Culture adequate volume   Culture  Setup Time   Final    GRAM NEGATIVE RODS IN BOTH AEROBIC AND ANAEROBIC BOTTLES CRITICAL RESULT CALLED TO, READ BACK BY AND VERIFIED WITH: PHARM D A.MAYER ON [id number] AT [place] Performed at [facility], [place] [address], [place], [place] [address]     Culture ESCHERICHIA COLI (A)  Final   Report Status 01/12/2021 FINAL  Final   Organism ID, Bacteria ESCHERICHIA COLI  Final      Susceptibility   Escherichia coli - MIC*    AMPICILLIN >=32 RESISTANT Resistant     CEFAZOLIN <=4 SENSITIVE Sensitive     CEFEPIME <=0.12 SENSITIVE Sensitive     CEFTAZIDIME <=1 SENSITIVE Sensitive     CEFTRIAXONE <=0.25 SENSITIVE Sensitive     CIPROFLOXACIN <=0.25 SENSITIVE Sensitive     GENTAMICIN <=1 SENSITIVE Sensitive     IMIPENEM <=0.25 SENSITIVE Sensitive     TRIMETH/SULFA >=320 RESISTANT Resistant     AMPICILLIN/SULBACTAM 8 SENSITIVE Sensitive     PIP/TAZO <=4 SENSITIVE Sensitive     * ESCHERICHIA COLI  Blood Culture ID Panel (Reflexed)     Status: Abnormal   Collection Time: 01/10/21  4:07 AM  Result Value Ref Range Status   Enterococcus faecalis NOT DETECTED NOT DETECTED Final   Enterococcus Faecium NOT DETECTED NOT DETECTED Final   Listeria monocytogenes NOT DETECTED NOT DETECTED Final   Staphylococcus species NOT DETECTED NOT DETECTED Final   Staphylococcus aureus (BCID) NOT DETECTED NOT DETECTED Final   Staphylococcus epidermidis NOT DETECTED NOT DETECTED Final   Staphylococcus lugdunensis NOT DETECTED NOT DETECTED Final   Streptococcus species NOT DETECTED NOT DETECTED Final   Streptococcus agalactiae NOT DETECTED NOT DETECTED Final   Streptococcus pneumoniae NOT DETECTED NOT DETECTED Final   Streptococcus pyogenes NOT DETECTED NOT DETECTED Final   A.calcoaceticus-baumannii NOT DETECTED NOT DETECTED Final   Bacteroides fragilis NOT DETECTED NOT DETECTED Final   Enterobacterales DETECTED (A) NOT DETECTED Final    Comment: Enterobacterales represent a large order of gram negative bacteria, not a single organism. CRITICAL RESULT CALLED TO, READ BACK BY AND VERIFIED WITH: PHARM D A.MAYER ON [id number] AT 1957 BY E.PARRISH    Enterobacter cloacae complex NOT DETECTED NOT DETECTED Final   Escherichia coli DETECTED (A) NOT DETECTED Final     Comment: CRITICAL RESULT CALLED TO, READ BACK BY AND VERIFIED WITH: PHARM D A.MAYER ON [id number] AT 1957 BY E.PARRISH    Klebsiella aerogenes NOT DETECTED NOT DETECTED Final   Klebsiella  oxytoca NOT DETECTED NOT DETECTED Final   Klebsiella pneumoniae NOT DETECTED NOT DETECTED Final   Proteus species NOT DETECTED NOT DETECTED Final   Salmonella species NOT DETECTED NOT DETECTED Final   Serratia marcescens NOT DETECTED NOT DETECTED Final   Haemophilus influenzae NOT DETECTED NOT DETECTED Final   Neisseria meningitidis NOT DETECTED NOT DETECTED Final   Pseudomonas aeruginosa NOT DETECTED NOT DETECTED Final   Stenotrophomonas maltophilia NOT DETECTED NOT DETECTED Final   Candida albicans NOT DETECTED NOT DETECTED Final   Candida auris NOT DETECTED NOT DETECTED Final   Candida glabrata NOT DETECTED NOT DETECTED Final   Candida krusei NOT DETECTED NOT DETECTED Final   Candida parapsilosis NOT DETECTED NOT DETECTED Final   Candida tropicalis NOT DETECTED NOT DETECTED Final   Cryptococcus neoformans/gattii NOT DETECTED NOT DETECTED Final   CTX-M ESBL NOT DETECTED NOT DETECTED Final   Carbapenem resistance IMP NOT DETECTED NOT DETECTED Final   Carbapenem resistance KPC NOT DETECTED NOT DETECTED Final   Carbapenem resistance NDM NOT DETECTED NOT DETECTED Final   Carbapenem resist OXA 48 LIKE NOT DETECTED NOT DETECTED Final   Carbapenem resistance VIM NOT DETECTED NOT DETECTED Final    Comment: Performed at [facility], [address] [place], [place] [address]  Resp Panel by RT-PCR (Flu A&B, Covid) Nasopharyngeal Swab     Status: None   Collection Time: 01/10/21  4:15 AM   Specimen: Nasopharyngeal Swab; Nasopharyngeal(NP) swabs in vial transport medium  Result Value Ref Range Status   SARS Coronavirus 2 by RT PCR NEGATIVE NEGATIVE Final    Comment: (NOTE) SARS-CoV-2 target nucleic acids are NOT DETECTED.  The SARS-CoV-2 RNA is generally detectable in upper respiratory specimens  during the acute phase of infection. The lowest concentration of SARS-CoV-2 viral copies this assay can detect is 138 copies/mL. A negative result does not preclude SARS-Cov-2 infection and should not be used as the sole basis for treatment or other patient management decisions. A negative result may occur with  improper specimen collection/handling, submission of specimen other than nasopharyngeal swab, presence of viral mutation(s) within the areas targeted by this assay, and inadequate number of viral copies(<138 copies/mL). A negative result must be combined with clinical observations, patient history, and epidemiological information. The expected result is Negative.  Fact Sheet for Patients:  EntrepreneurPulse.com.au  Fact Sheet for Healthcare Providers:  IncredibleEmployment.be  This test is no t yet approved or cleared by the [place] FDA and  has been authorized for detection and/or diagnosis of SARS-CoV-2 by FDA under an Emergency Use Authorization (EUA). This EUA will remain  in effect (meaning this test can be used) for the duration of the COVID-19 declaration under Section 564(b)(1) of the Act, 21 U.S.C.section 360bbb-3(b)(1), unless the authorization is terminated  or revoked sooner.       Influenza A by PCR NEGATIVE NEGATIVE Final   Influenza B by PCR NEGATIVE NEGATIVE Final    Comment: (NOTE) The Xpert Xpress SARS-CoV-2/FLU/RSV plus assay is intended as an aid in the diagnosis of influenza from Nasopharyngeal swab specimens and should not be used as a sole basis for treatment. Nasal washings and aspirates are unacceptable for Xpert Xpress SARS-CoV-2/FLU/RSV testing.  Fact Sheet for Patients: EntrepreneurPulse.com.au  Fact Sheet for Healthcare Providers: IncredibleEmployment.be  This test is not yet approved or cleared by the [place] FDA and has been authorized for detection  and/or diagnosis of SARS-CoV-2 by FDA under an Emergency Use Authorization (EUA). This EUA will remain in  effect (meaning this test can be used) for the duration of the COVID-19 declaration under Section 564(b)(1) of the Act, 21 U.S.C. section 360bbb-3(b)(1), unless the authorization is terminated or revoked.  Performed at [facility], [place] [address] [place], [place] [address]   SARS CORONAVIRUS 2 (TAT 6-24 HRS) Nasopharyngeal Nasopharyngeal Swab     Status: None   Collection Time: 01/15/21  1:30 PM   Specimen: Nasopharyngeal Swab  Result Value Ref Range Status   SARS Coronavirus 2 NEGATIVE NEGATIVE Final    Comment: (NOTE) SARS-CoV-2 target nucleic acids are NOT DETECTED.  The SARS-CoV-2 RNA is generally detectable in upper and lower respiratory specimens during the acute phase of infection. Negative results do not preclude SARS-CoV-2 infection, do not rule out co-infections with other pathogens, and should not be used as the sole basis for treatment or other patient management decisions. Negative results must be combined with clinical observations, patient history, and epidemiological information. The expected result is Negative.  Fact Sheet for Patients: SugarRoll.be  Fact Sheet for Healthcare Providers: https://www.woods-mathews.com/  This test is not yet approved or cleared by the [place] FDA and  has been authorized for detection and/or diagnosis of SARS-CoV-2 by FDA under an Emergency Use Authorization (EUA). This EUA will remain  in effect (meaning this test can be used) for the duration of the COVID-19 declaration under Se ction 564(b)(1) of the Act, 21 U.S.C. section 360bbb-3(b)(1), unless the authorization is terminated or revoked sooner.  Performed at [facility], [place] [address] [place], [place] [address]          Radiology Studies: No results found.       Scheduled Meds:   acetaminophen  500 mg Oral TID   amLODipine  2.5 mg Oral Daily   docusate sodium  100 mg Oral BID   enoxaparin (LOVENOX) injection  40 mg Subcutaneous K99I   folic acid  1 mg Oral Daily   insulin aspart  0-15 Units Subcutaneous TID WC   insulin aspart  0-5 Units Subcutaneous QHS   lidocaine  1 patch Transdermal Q24H   melatonin  5 mg Oral QHS   metoprolol tartrate  12.5 mg Oral BID   multivitamin with minerals  1 tablet Oral Daily   QUEtiapine  12.5 mg Oral QHS   temazepam  30 mg Oral QHS   thiamine  100 mg Oral Daily   Or   thiamine  100 mg Intravenous Daily   torsemide  20 mg Oral Daily   Continuous Infusions:  ampicillin-sulbactam (UNASYN) IV 3 g (01/18/21 0513)     LOS: 8 days    Time spent: 35 minutes    Irine Seal, MD [facility]   To contact the attending provider between 7A-7P or the covering provider during after hours 7P-7A, please log into the web site [email or web address] and access using universal [place] password for that web site. If you do not have the password, please call the hospital operator.  01/18/2021, 10:01 AM

## 2021-01-19 ENCOUNTER — Telehealth: Payer: Self-pay

## 2021-01-19 DIAGNOSIS — K5909 Other constipation: Secondary | ICD-10-CM | POA: Insufficient documentation

## 2021-01-19 DIAGNOSIS — S2242XD Multiple fractures of ribs, left side, subsequent encounter for fracture with routine healing: Secondary | ICD-10-CM | POA: Insufficient documentation

## 2021-01-19 DIAGNOSIS — R062 Wheezing: Secondary | ICD-10-CM | POA: Insufficient documentation

## 2021-01-19 DIAGNOSIS — G8929 Other chronic pain: Secondary | ICD-10-CM | POA: Insufficient documentation

## 2021-01-19 DIAGNOSIS — A4151 Sepsis due to Escherichia coli [E. coli]: Secondary | ICD-10-CM | POA: Insufficient documentation

## 2021-01-19 DIAGNOSIS — I158 Other secondary hypertension: Secondary | ICD-10-CM | POA: Insufficient documentation

## 2021-01-19 DIAGNOSIS — E7849 Other hyperlipidemia: Secondary | ICD-10-CM

## 2021-01-19 DIAGNOSIS — Z23 Encounter for immunization: Secondary | ICD-10-CM | POA: Insufficient documentation

## 2021-01-19 DIAGNOSIS — J189 Pneumonia, unspecified organism: Secondary | ICD-10-CM

## 2021-01-19 DIAGNOSIS — N179 Acute kidney failure, unspecified: Secondary | ICD-10-CM | POA: Insufficient documentation

## 2021-01-19 DIAGNOSIS — J9601 Acute respiratory failure with hypoxia: Secondary | ICD-10-CM | POA: Insufficient documentation

## 2021-01-19 DIAGNOSIS — E785 Hyperlipidemia, unspecified: Secondary | ICD-10-CM | POA: Insufficient documentation

## 2021-01-19 DIAGNOSIS — F5104 Psychophysiologic insomnia: Secondary | ICD-10-CM | POA: Insufficient documentation

## 2021-01-19 DIAGNOSIS — R052 Subacute cough: Secondary | ICD-10-CM | POA: Insufficient documentation

## 2021-01-19 DIAGNOSIS — E0865 Diabetes mellitus due to underlying condition with hyperglycemia: Secondary | ICD-10-CM | POA: Insufficient documentation

## 2021-01-19 DIAGNOSIS — M4840XA Fatigue fracture of vertebra, site unspecified, initial encounter for fracture: Secondary | ICD-10-CM | POA: Insufficient documentation

## 2021-01-19 DIAGNOSIS — E569 Vitamin deficiency, unspecified: Secondary | ICD-10-CM | POA: Insufficient documentation

## 2021-01-19 DIAGNOSIS — R7881 Bacteremia: Secondary | ICD-10-CM | POA: Insufficient documentation

## 2021-01-19 DIAGNOSIS — R079 Chest pain, unspecified: Secondary | ICD-10-CM | POA: Insufficient documentation

## 2021-01-19 DIAGNOSIS — K5900 Constipation, unspecified: Secondary | ICD-10-CM | POA: Insufficient documentation

## 2021-01-19 DIAGNOSIS — J155 Pneumonia due to Escherichia coli: Secondary | ICD-10-CM | POA: Insufficient documentation

## 2021-01-19 DIAGNOSIS — G9341 Metabolic encephalopathy: Secondary | ICD-10-CM | POA: Insufficient documentation

## 2021-01-19 LAB — GLUCOSE, CAPILLARY
Glucose-Capillary: 117 mg/dL — ABNORMAL HIGH (ref 70–99)
Glucose-Capillary: 122 mg/dL — ABNORMAL HIGH (ref 70–99)

## 2021-01-19 LAB — SARS CORONAVIRUS 2 (TAT 6-24 HRS): SARS Coronavirus 2: NEGATIVE

## 2021-01-19 NOTE — Progress Notes (Signed)
Physical Therapy Treatment Patient Details Name: Deanna Salinas MRN: [id number] DOB: 10/27/1935 Today's Date: 01/19/2021    History of Present Illness 85 year old female who presented to ED on 6/4 due to chest pain, fever and shortness of breath. In the ED she was found to be hypoxic with sats in the mid 80s as well as left-sided lobar pneumonia with a trace left-sided pleural effusion. Pt diagnosed with sepsis. Pt also with recent fall and L rib fractures. PMH: HTN, HLD, DM2    PT Comments    Continuing work on functional mobility and activity tolerance;  session focused on car transfers in prep for dc to SNF today; Performed sit<>stand and pivot transfers x3 with moderate assist; notable very good job communicating with caregiver re: when she would take steps or sit; encouraged her to continue to communicate her needs with her caregivers, especially with the car transfer to wheelchair at [facility] in [place]  Follow Up Recommendations  SNF;Supervision/Assistance - 24 hour     Equipment Recommendations  Wheelchair cushion (measurements PT);Wheelchair (measurements PT)    Recommendations for Other Services       Precautions / Restrictions Precautions Precautions: Fall;Other (comment)    Mobility  Bed Mobility               General bed mobility comments: in wheelchair upon arrival    Transfers Overall transfer level: Needs assistance Equipment used: 1 person hand held assist Transfers: Sit to/from Stand;Stand Pivot Transfers Sit to Stand: Mod assist Stand pivot transfers: Mod assist       General transfer comment: Went to [facility] gym to work with car simulation; stood from transport wheelchair, took pivotal steps to sit down to car mock up; Mod assist for initial rise, then light mod assist to steady and help feet into the car; stood from low seat of car with heavy mod assist to rise, then pivot steps to regular wheelchair; then stood and pivoted regualr wheelchair to transport  wheelchair, and headed back to room  Ambulation/Gait                 Stairs             Wheelchair Mobility    Modified Rankin (Stroke Patients Only)       Balance     Sitting balance-Leahy Scale: Fair       Standing balance-Leahy Scale: Poor                              Cognition Arousal/Alertness: Awake/alert Behavior During Therapy: WFL for tasks assessed/performed Overall Cognitive Status: Within Functional Limits for tasks assessed (for simple tasks assessed)                                 General Comments: Much improved from last week; she know she is going to rehab in [place] today      Exercises      General Comments General comments (skin integrity, edema, etc.): Discussed gait belt placement and strategies for transfers once at rehab center in [place]      Pertinent Vitals/Pain Pain Assessment: Faces Faces Pain Scale: Hurts a little bit Pain Location: bil knees with OA Pain Descriptors / Indicators: Grimacing Pain Intervention(s): Monitored during session    Home Living  Prior Function            PT Goals (current goals can now be found in the care plan section) Acute Rehab PT Goals Patient Stated Goal: to get discharged in time to get to SNF in [place] (daughter stated goal and pt agreed) PT Goal Formulation: With patient Time For Goal Achievement: 01/25/21 Potential to Achieve Goals: Fair Progress towards PT goals: Progressing toward goals    Frequency    Min 2X/week      PT Plan Current plan remains appropriate    Co-evaluation              AM-PAC PT "6 Clicks" Mobility   Outcome Measure  Help needed turning from your back to your side while in a flat bed without using bedrails?: A Little Help needed moving from lying on your back to sitting on the side of a flat bed without using bedrails?: A Little Help needed moving to and from a bed to a chair (including a  wheelchair)?: A Lot Help needed standing up from a chair using your arms (e.g., wheelchair or bedside chair)?: A Lot Help needed to walk in hospital room?: A Lot Help needed climbing 3-5 steps with a railing? : Total 6 Click Score: 13    End of Session Equipment Utilized During Treatment: Gait belt (at axillae) Activity Tolerance: Patient tolerated treatment well Patient left: in chair;with call bell/phone within reach;with family/visitor present (in wheelchair, ready for dc) Nurse Communication: Mobility status PT Visit Diagnosis: Unsteadiness on feet (R26.81);Muscle weakness (generalized) (M62.81);History of falling (Z91.81);Difficulty in walking, not elsewhere classified (R26.2)     Time: 2440-1027 PT Time Calculation (min) (ACUTE ONLY): 28 min  Charges:  $Therapeutic Activity: 23-37 mins                     Roney Marion, PT  Acute Rehabilitation Services Pager [phone] Office [place] 01/19/2021, 1:26 PM

## 2021-01-19 NOTE — Care Management Important Message (Signed)
Important Message  Patient Details  Name: Deanna Salinas MRN: [id number] Date of Birth: 07-06-1936   Medicare Important Message Given:  Yes     Sawyer Kahan P [place] 01/19/2021, 1:07 PM

## 2021-01-19 NOTE — Telephone Encounter (Signed)
Pt was discharged from the hospital and needs Physical Therapy ordered.   Pt call back [phone]

## 2021-01-19 NOTE — Plan of Care (Signed)
Pt discharging to a facility for continued rehab/therapy care to increase pt strength. Family to transport pt today to facility.

## 2021-01-19 NOTE — Discharge Summary (Signed)
Physician Discharge Summary  Deanna Salinas PYK:[id number] DOB: [birth year] DOA: 01/10/2021  PCP: Kathyrn Drown, MD  Admit date: 01/10/2021 Discharge date: 01/19/2021  Time spent: 55 minutes  Recommendations for Outpatient Follow-up:  Patient with discharge to skilled nursing facility Follow-up with MD at skilled nursing facility.  Patient will need a basic metabolic profile done in 1 week to follow-up on electrolytes and renal function.   Discharge Diagnoses:  Principal Problem:   Sepsis due to pneumonia [facility]) Active Problems:   Hyperlipidemia   Essential hypertension   Chronic insomnia   Type 2 diabetes mellitus (HCC)   Chest pain of uncertain etiology   Rib fractures   Acute metabolic encephalopathy   E coli bacteremia   Discharge Condition: Stable and improved  Diet recommendation: Carb modified diet  Filed Weights   01/15/21 0542 01/16/21 0447 01/19/21 0524  Weight: 101 kg 101.5 kg 100.3 kg    History of present illness:  HPI per Dr. Tessie Fass is a 85 y.o. female with medical history significant of HTN; HLD and DM presented with SOB, CP. She started having CP and SOB last night.  She has had mild symptoms recently.  She recently fell and had a fractured rib.  She fell on May 14 and went to the ER then.  She has been feeling some nauseated, not eating much, coughing some with mild phlegm.  +fever/chills for the last 4 days.  She has chronic diffuse body pain and has had intermittent L chest pain since she fell and broke her rib.  This afternoon, I was called back to her room for recurrent CP - non-exertional, some relief with NTG.       ED Course:  Carryover, per Dr. Nevada Crane:   85 yo F past medical history hypertension, chronic anxiety/depression, recent fall, with a chief complaint of feeling unwell for a few days.  Associated mild cough and chest pain that radiates into the left shoulder.  Has been too fatigued to get up and move around.  Recently had a fall  and suffered broken ribs.  Work-up in the ED revealed left lower lobe infiltrates and left pleural effusion with concern for pneumonia.  Started on IV antibiotics empirically in the ED.  While in the ED she was found to be hypoxic with O2 saturation in the mid 80s, not on oxygen supplementation at baseline  Hospital Course:  1 sepsis secondary to E. coli bacteremia/lobar pneumonia -Patient pancultured blood cultures positive for E. Coli. -Chest x-ray concerning for pneumonia. -Afebrile. -Leukocytosis trended down. -Was on IV Rocephin and subsequently transitioned to IV Unasyn. -Patient completed full course of IV Unasyn during the hospitalization  -Patient was seen in consultation by ID during the hospitalization who recommended a full course of antibiotic treatment of 7 days which patient completed.   -Outpatient follow-up.     2.  Acute hypoxic respiratory failure secondary to pneumonia -Noted to have required 2 L nasal cannula. -Afebrile. -Improving.  Sats of 95% on room air . -DC IV Unasyn after today's doses.   -Continue Demadex.   -Supportive care.     3.  Acute metabolic encephalopathy -Likely secondary to problem #1. -Patient noted to be agitated in the night of 01/13/2021, and given IV Ativan due to concerns for possible alcohol withdrawal. -Head CT negative. -Ammonia levels within normal limits at 23. -Improved clinically daily and was close to baseline by day of discharge.   -Status post full course of IV antibiotics.   -  Patient placed on low-dose Seroquel nightly  -Medications that could avoid sensorium were discontinued  -Narcotics limited.  -Outpatient follow-up.    4.  Chest pain with elevated troponin -Likely secondary to LLL PNA, left-sided rib fractures. -Likely musculoskeletal in nature in addition. -Troponins flattened. -2D echo with EF of 60 to 65%,NWMA, mild LVH, grade 1DD. -Outpatient follow-up   5.  Acute kidney injury -Felt likely component of volume  overload. -Renal function improved with diuresis.   -Creatinine was down to 1.17 by day of discharge.   -Patient maintained on home regimen Demadex.   -Outpatient follow-up.     6.  Left rib fractures -Noted on chest x-ray on presentation which include at least posterior fourth, fifth, seventh ribs -Rib fracture likely etiology of left-sided chest pain. -Patient placed on lidocaine patch and scheduled Tylenol 3 times daily as well as a K pad as needed. -Minimize narcotics due to acute encephalopathy. -Incentive spirometry.   7.  Hypertension -ACE inhibitor on hold secondary to AKI. -Blood pressure stable. -Patient was maintained on Lopressor, Norvasc, Demadex with good blood pressure control.  -Outpatient follow-up.    8.  Diabetes mellitus type 2 -Hemoglobin A1c 7.4. -Patient maintained on sliding scale insulin throughout the hospitalization.    9.  Hyperlipidemia -Not on any medications at home.   -Outpatient follow-up.   10.?  COPD versus pulmonary fluid -Patient noted to be wheezing early on during the hospitalization which has improved. -Chest x-ray with left-sided pleural effusion. -Patient given a dose of Solu-Medrol x1 and nebs. -Patient received a dose of IV Lasix with good urine output.   -Patient now back on home regimen of Demadex with a good urine output throughout the hospitalization.   -Patient asymptomatic by day of discharge.  11.  Sinus tachycardia -Patient noted to have bouts of tachycardia on 01/15/2021. -Review of epic chart notes patient has had some resting tachycardia noted on last cardiology note which seem to improve. -Telemetry monitor with sinus tachycardia noted with heart rates in the 140s to 150s on 01/15/2021. -Patient placed on Lopressor 12.5 mg twice daily with significant improvement with sinus tachycardia resolution by day of discharge.  -Outpatient follow-up with primary cardiologist.     Procedures: CT head 01/13/2021 Chest x-ray 01/10/2021,  01/13/2021 2D echo 01/14/2021  Consultations: ID: Dr. Linus Salmons 01/13/2021    Discharge Exam: Vitals:   01/19/21 0046 01/19/21 0524  BP: 118/84 (!) 141/67  Pulse: 84 81  Resp: 18 18  Temp: 97.7 F (36.5 C) 98 F (36.7 C)  SpO2: 98% 100%    General: NAD Cardiovascular: Regular rate rhythm no murmurs rubs or gallops.  No JVD.  1-2+ bilateral lower extremity edema. Respiratory: CTA B  Discharge Instructions   Discharge Instructions     Diet Carb Modified   Complete by: As directed    Increase activity slowly   Complete by: As directed       Allergies as of 01/19/2021       Reactions   Ambien [zolpidem Tartrate]    Side effects   Lipitor [atorvastatin]    Muscle and joint pain   Metformin And Related    Reported side effects headaches insomnia nausea   Pravastatin    Muscle and joint pain   Trazodone And Nefazodone    Side effects        Medication List     STOP taking these medications    nortriptyline 10 MG capsule Commonly known as: PAMELOR   quinapril 20 MG  tablet Commonly known as: ACCUPRIL       TAKE these medications    acetaminophen 500 MG tablet Commonly known as: TYLENOL Take 1 tablet (500 mg total) by mouth 3 (three) times daily for 5 days.   albuterol 108 (90 Base) MCG/ACT inhaler Commonly known as: VENTOLIN HFA Inhale 2 puffs into the lungs every 6 (six) hours as needed for wheezing or shortness of breath.   amLODipine 2.5 MG tablet Commonly known as: NORVASC Take 1 tablet (2.5 mg total) by mouth daily.   Bayer Contour Next Test test strip Generic drug: glucose blood USE AS DIRECTED   diclofenac Sodium 1 % Gel Commonly known as: VOLTAREN Apply 2 g topically 4 (four) times daily as needed (pain).   docusate sodium 100 MG capsule Commonly known as: COLACE Take 1 capsule (100 mg total) by mouth 2 (two) times daily.   folic acid 1 MG tablet Commonly known as: FOLVITE Take 1 tablet (1 mg total) by mouth daily.    guaiFENesin-dextromethorphan 100-10 MG/5ML syrup Commonly known as: ROBITUSSIN DM Take 5 mLs by mouth every 4 (four) hours as needed for cough.   lidocaine 5 % Commonly known as: LIDODERM Place 1 patch onto the skin daily. Remove & Discard patch within 12 hours or as directed by MD   melatonin 5 MG Tabs Take 1 tablet (5 mg total) by mouth at bedtime.   metoprolol tartrate 25 MG tablet Commonly known as: LOPRESSOR Take 0.5 tablets (12.5 mg total) by mouth 2 (two) times daily.   MULTI-VITAMIN DAILY PO Take 1 tablet by mouth daily.   QUEtiapine 25 MG tablet Commonly known as: SEROQUEL Take 0.5 tablets (12.5 mg total) by mouth at bedtime.   temazepam 30 MG capsule Commonly known as: RESTORIL TAKE 1 CAPSULE BY MOUTH AT BEDTIME AS NEEDED FOR SLEEP. What changed: See the new instructions.   thiamine 100 MG tablet Take 1 tablet (100 mg total) by mouth daily.   torsemide 20 MG tablet Commonly known as: DEMADEX Take 1 tablet (20 mg total) by mouth daily.   traMADol 50 MG tablet Commonly known as: ULTRAM Take 25-50 mg by mouth 2 (two) times daily as needed.       Allergies  Allergen Reactions   Ambien [Zolpidem Tartrate]     Side effects    Lipitor [Atorvastatin]     Muscle and joint pain   Metformin And Related     Reported side effects headaches insomnia nausea   Pravastatin     Muscle and joint pain   Trazodone And Nefazodone     Side effects    Follow-up Information     MD AT SNF Follow up.                   The results of significant diagnostics from this hospitalization (including imaging, microbiology, ancillary and laboratory) are listed below for reference.    Significant Diagnostic Studies: DG Ribs Unilateral W/Chest Left  Result Date: 12/20/2020 CLINICAL DATA:  Left rib tenderness after fall EXAM: LEFT RIBS AND CHEST - 3+ VIEW COMPARISON:  06/04/2020 FINDINGS: Nondisplaced left sixth rib fracture. Chronic elevation of the left diaphragm with  overlying scarring. There is no edema, consolidation, effusion, or pneumothorax. Normal heart size and aortic contours IMPRESSION: Nondisplaced left sixth rib fracture. Electronically Signed   By: Monte Fantasia M.D.   On: 12/20/2020 10:07   CT HEAD WO CONTRAST  Result Date: 01/13/2021 CLINICAL DATA:  Headache EXAM: CT HEAD WITHOUT CONTRAST  TECHNIQUE: Contiguous axial images were obtained from the base of the skull through the vertex without intravenous contrast. COMPARISON:  12/20/2020 FINDINGS: Brain: There is no acute intracranial hemorrhage, mass effect, or edema. Gray-white differentiation is preserved. There is no extra-axial fluid collection. Ventricles and sulci are stable in size and configuration. Vascular: There is atherosclerotic calcification at the skull base. Skull: Calvarium is unremarkable. Sinuses/Orbits: No acute finding. Other: None. IMPRESSION: No acute intracranial abnormality or significant change since recent prior study. Electronically Signed   By: Macy Mis M.D.   On: 01/13/2021 13:09   CT Head Wo Contrast  Result Date: 12/20/2020 CLINICAL DATA:  Left-sided pain post fall EXAM: CT HEAD WITHOUT CONTRAST TECHNIQUE: Contiguous axial images were obtained from the base of the skull through the vertex without intravenous contrast. COMPARISON:  None. FINDINGS: Brain: Mild atrophy. No evidence of acute infarction, hemorrhage, hydrocephalus, extra-axial collection or mass lesion/mass effect. Vascular: Atherosclerotic and physiologic intracranial calcifications. Skull: Normal. Negative for fracture or focal lesion. Sinuses/Orbits: Hypoplastic frontal sinuses.  Otherwise negative. Other: None IMPRESSION: No acute findings Electronically Signed   By: Lucrezia Europe M.D.   On: 12/20/2020 10:17   DG CHEST PORT 1 VIEW  Result Date: 01/13/2021 CLINICAL DATA:  Hypoxia. EXAM: PORTABLE CHEST 1 VIEW COMPARISON:  January 10, 2021. FINDINGS: Stable cardiomediastinal silhouette. No pneumothorax is noted.  Mild right basilar subsegmental atelectasis is noted. Mild left pleural effusion is noted with associated left basilar atelectasis or infiltrate. Bony thorax is unremarkable. IMPRESSION: Mild left pleural effusion is noted with associated left basilar atelectasis or infiltrate. Electronically Signed   By: Marijo Conception M.D.   On: 01/13/2021 16:26   DG Chest Port 1 View  Result Date: 01/10/2021 CLINICAL DATA:  Questionable sepsis EXAM: PORTABLE CHEST 1 VIEW COMPARISON:  Chest x-ray 12/20/2020 FINDINGS: The heart size and mediastinal contours are within normal limits. Redemonstration of elevation of left hemidiaphragm. Interval development of patchy airspace opacity at the left base with question of a trace left pleural effusion. Atelectasis at the right base. No pulmonary edema. No pleural effusion. No pneumothorax. Age-indeterminate left posterior fourth, fifth, seventh rib fractures. Degenerative changes of bilateral shoulders. IMPRESSION: 1. Interval development of patchy airspace opacity at the left base with question of a trace left pleural effusion. Persistent elevation of left hemidiaphragm. 2. Several age-indeterminate displaced left rib fractures including at least the posterior fourth, fifth, seventh ribs. Electronically Signed   By: Iven Finn M.D.   On: 01/10/2021 04:35   ECHOCARDIOGRAM COMPLETE  Result Date: 01/14/2021    ECHOCARDIOGRAM REPORT   Patient Name:   MYKA HITZ Date of Exam: 01/14/2021 Medical Rec #:  [id number]       Height:       65.0 in Accession #:    [id number]      Weight:       240.3 lb Date of Birth:  Oct 27, 1935        BSA:          2.139 m Patient Age:    14 years        BP:           149/107 mmHg Patient Gender: F               HR:           98 bpm. Exam Location:  Inpatient Procedure: 2D Echo, Cardiac Doppler and Color Doppler Indications:    Dyspnea  History:  Patient has prior history of Echocardiogram examinations, most                 recent 03/31/2020.  Signs/Symptoms:Dyspnea, Bacteremia and Altered                 Mental Status; Risk Factors:Dyslipidemia, Diabetes, Hypertension                 and Morbid obesity. Pneumonia, left rib fractures.  Sonographer:    Dustin Flock Referring Phys: [phone] JESSICA U Eliseo Squires  Sonographer Comments: Image acquisition challenging due to uncooperative patient and Image acquisition challenging due to patient body habitus. IMPRESSIONS  1. Left ventricular ejection fraction, by estimation, is 60 to 65%. The left ventricle has normal function. The left ventricle has no regional wall motion abnormalities. There is mild left ventricular hypertrophy. Left ventricular diastolic parameters are consistent with Grade I diastolic dysfunction (impaired relaxation).  2. Right ventricular systolic function is normal. The right ventricular size is normal. There is mildly elevated pulmonary artery systolic pressure. The estimated right ventricular systolic pressure is 47.4 mmHg.  3. The mitral valve is normal in structure. No evidence of mitral valve regurgitation. No evidence of mitral stenosis.  4. The aortic valve is calcified. Aortic valve regurgitation is not visualized. Mild aortic valve sclerosis is present, with no evidence of aortic valve stenosis.  5. The inferior vena cava is normal in size with greater than 50% respiratory variability, suggesting right atrial pressure of 3 mmHg. Comparison(s): No significant change from prior study. Prior images reviewed side by side. FINDINGS  Left Ventricle: Left ventricular ejection fraction, by estimation, is 60 to 65%. The left ventricle has normal function. The left ventricle has no regional wall motion abnormalities. The left ventricular internal cavity size was normal in size. There is  mild left ventricular hypertrophy. Left ventricular diastolic parameters are consistent with Grade I diastolic dysfunction (impaired relaxation). Right Ventricle: The right ventricular size is normal. No  increase in right ventricular wall thickness. Right ventricular systolic function is normal. There is mildly elevated pulmonary artery systolic pressure. The tricuspid regurgitant velocity is 2.88  m/s, and with an assumed right atrial pressure of 3 mmHg, the estimated right ventricular systolic pressure is 25.9 mmHg. Left Atrium: Left atrial size was normal in size. Right Atrium: Right atrial size was normal in size. Pericardium: There is no evidence of pericardial effusion. Mitral Valve: The mitral valve is normal in structure. No evidence of mitral valve regurgitation. No evidence of mitral valve stenosis. Tricuspid Valve: The tricuspid valve is normal in structure. Tricuspid valve regurgitation is trivial. No evidence of tricuspid stenosis. Aortic Valve: The aortic valve is calcified. Aortic valve regurgitation is not visualized. Mild aortic valve sclerosis is present, with no evidence of aortic valve stenosis. Pulmonic Valve: The pulmonic valve was normal in structure. Pulmonic valve regurgitation is not visualized. No evidence of pulmonic stenosis. Aorta: The aortic root is normal in size and structure. Venous: The inferior vena cava is normal in size with greater than 50% respiratory variability, suggesting right atrial pressure of 3 mmHg. IAS/Shunts: No atrial level shunt detected by color flow Doppler.  LEFT VENTRICLE PLAX 2D LVIDd:         5.00 cm  Diastology LVIDs:         3.70 cm  LV e' medial:    5.55 cm/s LV PW:         1.20 cm  LV E/e' medial:  9.7 LV IVS:  1.30 cm  LV e' lateral:   7.07 cm/s LVOT diam:     2.10 cm  LV E/e' lateral: 7.6 LV SV:         47 LV SV Index:   22 LVOT Area:     3.46 cm  RIGHT VENTRICLE RV Basal diam:  3.40 cm RV S prime:     8.92 cm/s LEFT ATRIUM             Index       RIGHT ATRIUM           Index LA diam:        3.00 cm 1.40 cm/m  RA Area:     16.50 cm LA Vol (A2C):   36.7 ml 17.16 ml/m RA Volume:   47.50 ml  22.21 ml/m LA Vol (A4C):   52.4 ml 24.50 ml/m LA  Biplane Vol: 48.6 ml 22.72 ml/m  AORTIC VALVE LVOT Vmax:   78.30 cm/s LVOT Vmean:  54.800 cm/s LVOT VTI:    0.137 m  AORTA Ao Root diam: 3.00 cm MITRAL VALVE               TRICUSPID VALVE MV Area (PHT): 2.19 cm    TR Peak grad:   33.2 mmHg MV Decel Time: 347 msec    TR Vmax:        288.00 cm/s MV E velocity: 54.00 cm/s MV A velocity: 90.80 cm/s  SHUNTS MV E/A ratio:  0.59        Systemic VTI:  0.14 m                            Systemic Diam: 2.10 cm Candee Furbish MD Electronically signed by Candee Furbish MD Signature Date/Time: 01/14/2021/11:36:42 AM    Final     Microbiology: Recent Results (from the past 240 hour(s))  Blood culture (routine single)     Status: Abnormal   Collection Time: 01/10/21  4:07 AM   Specimen: BLOOD  Result Value Ref Range Status   Specimen Description BLOOD RIGHT ANTECUBITAL  Final   Special Requests   Final    BOTTLES DRAWN AEROBIC AND ANAEROBIC Blood Culture adequate volume   Culture  Setup Time   Final    GRAM NEGATIVE RODS IN BOTH AEROBIC AND ANAEROBIC BOTTLES CRITICAL RESULT CALLED TO, READ BACK BY AND VERIFIED WITH: PHARM D A.MAYER ON [id number] AT [place] Performed at [facility], [place] [address], [place], [place] [address]    Culture ESCHERICHIA COLI (A)  Final   Report Status 01/12/2021 FINAL  Final   Organism ID, Bacteria ESCHERICHIA COLI  Final      Susceptibility   Escherichia coli - MIC*    AMPICILLIN >=32 RESISTANT Resistant     CEFAZOLIN <=4 SENSITIVE Sensitive     CEFEPIME <=0.12 SENSITIVE Sensitive     CEFTAZIDIME <=1 SENSITIVE Sensitive     CEFTRIAXONE <=0.25 SENSITIVE Sensitive     CIPROFLOXACIN <=0.25 SENSITIVE Sensitive     GENTAMICIN <=1 SENSITIVE Sensitive     IMIPENEM <=0.25 SENSITIVE Sensitive     TRIMETH/SULFA >=320 RESISTANT Resistant     AMPICILLIN/SULBACTAM 8 SENSITIVE Sensitive     PIP/TAZO <=4 SENSITIVE Sensitive     * ESCHERICHIA COLI  Blood Culture ID Panel (Reflexed)     Status: Abnormal   Collection Time:  01/10/21  4:07 AM  Result Value Ref Range Status   Enterococcus faecalis NOT  DETECTED NOT DETECTED Final   Enterococcus Faecium NOT DETECTED NOT DETECTED Final   Listeria monocytogenes NOT DETECTED NOT DETECTED Final   Staphylococcus species NOT DETECTED NOT DETECTED Final   Staphylococcus aureus (BCID) NOT DETECTED NOT DETECTED Final   Staphylococcus epidermidis NOT DETECTED NOT DETECTED Final   Staphylococcus lugdunensis NOT DETECTED NOT DETECTED Final   Streptococcus species NOT DETECTED NOT DETECTED Final   Streptococcus agalactiae NOT DETECTED NOT DETECTED Final   Streptococcus pneumoniae NOT DETECTED NOT DETECTED Final   Streptococcus pyogenes NOT DETECTED NOT DETECTED Final   A.calcoaceticus-baumannii NOT DETECTED NOT DETECTED Final   Bacteroides fragilis NOT DETECTED NOT DETECTED Final   Enterobacterales DETECTED (A) NOT DETECTED Final    Comment: Enterobacterales represent a large order of gram negative bacteria, not a single organism. CRITICAL RESULT CALLED TO, READ BACK BY AND VERIFIED WITH: PHARM D A.MAYER ON [id number] AT 1957 BY E.PARRISH    Enterobacter cloacae complex NOT DETECTED NOT DETECTED Final   Escherichia coli DETECTED (A) NOT DETECTED Final    Comment: CRITICAL RESULT CALLED TO, READ BACK BY AND VERIFIED WITH: PHARM D A.MAYER ON [id number] AT 1957 BY E.PARRISH    Klebsiella aerogenes NOT DETECTED NOT DETECTED Final   Klebsiella oxytoca NOT DETECTED NOT DETECTED Final   Klebsiella pneumoniae NOT DETECTED NOT DETECTED Final   Proteus species NOT DETECTED NOT DETECTED Final   Salmonella species NOT DETECTED NOT DETECTED Final   Serratia marcescens NOT DETECTED NOT DETECTED Final   Haemophilus influenzae NOT DETECTED NOT DETECTED Final   Neisseria meningitidis NOT DETECTED NOT DETECTED Final   Pseudomonas aeruginosa NOT DETECTED NOT DETECTED Final   Stenotrophomonas maltophilia NOT DETECTED NOT DETECTED Final   Candida albicans NOT DETECTED NOT DETECTED Final    Candida auris NOT DETECTED NOT DETECTED Final   Candida glabrata NOT DETECTED NOT DETECTED Final   Candida krusei NOT DETECTED NOT DETECTED Final   Candida parapsilosis NOT DETECTED NOT DETECTED Final   Candida tropicalis NOT DETECTED NOT DETECTED Final   Cryptococcus neoformans/gattii NOT DETECTED NOT DETECTED Final   CTX-M ESBL NOT DETECTED NOT DETECTED Final   Carbapenem resistance IMP NOT DETECTED NOT DETECTED Final   Carbapenem resistance KPC NOT DETECTED NOT DETECTED Final   Carbapenem resistance NDM NOT DETECTED NOT DETECTED Final   Carbapenem resist OXA 48 LIKE NOT DETECTED NOT DETECTED Final   Carbapenem resistance VIM NOT DETECTED NOT DETECTED Final    Comment: Performed at [facility], [address] [place], [place] [address]  Resp Panel by RT-PCR (Flu A&B, Covid) Nasopharyngeal Swab     Status: None   Collection Time: 01/10/21  4:15 AM   Specimen: Nasopharyngeal Swab; Nasopharyngeal(NP) swabs in vial transport medium  Result Value Ref Range Status   SARS Coronavirus 2 by RT PCR NEGATIVE NEGATIVE Final    Comment: (NOTE) SARS-CoV-2 target nucleic acids are NOT DETECTED.  The SARS-CoV-2 RNA is generally detectable in upper respiratory specimens during the acute phase of infection. The lowest concentration of SARS-CoV-2 viral copies this assay can detect is 138 copies/mL. A negative result does not preclude SARS-Cov-2 infection and should not be used as the sole basis for treatment or other patient management decisions. A negative result may occur with  improper specimen collection/handling, submission of specimen other than nasopharyngeal swab, presence of viral mutation(s) within the areas targeted by this assay, and inadequate number of viral copies(<138 copies/mL). A negative result must be combined with clinical observations, patient history, and epidemiological information.  The expected result is Negative.  Fact Sheet for Patients:   EntrepreneurPulse.com.au  Fact Sheet for Healthcare Providers:  IncredibleEmployment.be  This test is no t yet approved or cleared by the [place] FDA and  has been authorized for detection and/or diagnosis of SARS-CoV-2 by FDA under an Emergency Use Authorization (EUA). This EUA will remain  in effect (meaning this test can be used) for the duration of the COVID-19 declaration under Section 564(b)(1) of the Act, 21 U.S.C.section 360bbb-3(b)(1), unless the authorization is terminated  or revoked sooner.       Influenza A by PCR NEGATIVE NEGATIVE Final   Influenza B by PCR NEGATIVE NEGATIVE Final    Comment: (NOTE) The Xpert Xpress SARS-CoV-2/FLU/RSV plus assay is intended as an aid in the diagnosis of influenza from Nasopharyngeal swab specimens and should not be used as a sole basis for treatment. Nasal washings and aspirates are unacceptable for Xpert Xpress SARS-CoV-2/FLU/RSV testing.  Fact Sheet for Patients: EntrepreneurPulse.com.au  Fact Sheet for Healthcare Providers: [employer]  This test is not yet approved or cleared by the [place] FDA and has been authorized for detection and/or diagnosis of SARS-CoV-2 by FDA under an Emergency Use Authorization (EUA). This EUA will remain in effect (meaning this test can be used) for the duration of the COVID-19 declaration under Section 564(b)(1) of the Act, 21 U.S.C. section 360bbb-3(b)(1), unless the authorization is terminated or revoked.  Performed at [facility], [place] [address] [place], [place] [address]   SARS CORONAVIRUS 2 (TAT 6-24 HRS) Nasopharyngeal Nasopharyngeal Swab     Status: None   Collection Time: 01/15/21  1:30 PM   Specimen: Nasopharyngeal Swab  Result Value Ref Range Status   SARS Coronavirus 2 NEGATIVE NEGATIVE Final    Comment: (NOTE) SARS-CoV-2 target nucleic acids are NOT DETECTED.  The  SARS-CoV-2 RNA is generally detectable in upper and lower respiratory specimens during the acute phase of infection. Negative results do not preclude SARS-CoV-2 infection, do not rule out co-infections with other pathogens, and should not be used as the sole basis for treatment or other patient management decisions. Negative results must be combined with clinical observations, patient history, and epidemiological information. The expected result is Negative.  Fact Sheet for Patients: SugarRoll.be  Fact Sheet for Healthcare Providers: https://www.woods-mathews.com/  This test is not yet approved or cleared by the [place] FDA and  has been authorized for detection and/or diagnosis of SARS-CoV-2 by FDA under an Emergency Use Authorization (EUA). This EUA will remain  in effect (meaning this test can be used) for the duration of the COVID-19 declaration under Se ction 564(b)(1) of the Act, 21 U.S.C. section 360bbb-3(b)(1), unless the authorization is terminated or revoked sooner.  Performed at [facility], [place] [address] [place], [place] [address]   SARS CORONAVIRUS 2 (TAT 6-24 HRS) Nasopharyngeal Nasopharyngeal Swab     Status: None   Collection Time: 01/18/21  2:30 PM   Specimen: Nasopharyngeal Swab  Result Value Ref Range Status   SARS Coronavirus 2 NEGATIVE NEGATIVE Final    Comment: (NOTE) SARS-CoV-2 target nucleic acids are NOT DETECTED.  The SARS-CoV-2 RNA is generally detectable in upper and lower respiratory specimens during the acute phase of infection. Negative results do not preclude SARS-CoV-2 infection, do not rule out co-infections with other pathogens, and should not be used as the sole basis for treatment or other patient management decisions. Negative results must be combined with clinical observations, patient history, and epidemiological information.  The expected result is Negative.  Fact Sheet for  Patients: SugarRoll.be  Fact Sheet for Healthcare Providers: https://www.woods-mathews.com/  This test is not yet approved or cleared by the [place] FDA and  has been authorized for detection and/or diagnosis of SARS-CoV-2 by FDA under an Emergency Use Authorization (EUA). This EUA will remain  in effect (meaning this test can be used) for the duration of the COVID-19 declaration under Se ction 564(b)(1) of the Act, 21 U.S.C. section 360bbb-3(b)(1), unless the authorization is terminated or revoked sooner.  Performed at [facility], [place] [address], [place], [place] [address]      Labs: Basic Metabolic Panel: Recent Labs  Lab 01/13/21 0705 01/14/21 0624 01/15/21 0329 01/16/21 0300 01/17/21 0218  NA 136 140 143 144 143  K 4.1 4.3 4.1 3.5 3.8  CL 100 96* 96* 92* 94*  CO2 27 32 35* 41* 39*  GLUCOSE 119* 180* 145* 184* 160*  BUN 49* 41* 36* 29* 21  CREATININE 1.56* 1.30* 1.11* 1.01* 1.17*  CALCIUM 8.7* 8.5* 8.7* 8.5* 8.3*  MG 2.3  --   --  1.7 2.2   Liver Function Tests: No results for input(s): AST, ALT, ALKPHOS, BILITOT, PROT, ALBUMIN in the last 168 hours. No results for input(s): LIPASE, AMYLASE in the last 168 hours. Recent Labs  Lab 01/14/21 1101  AMMONIA 23   CBC: Recent Labs  Lab 01/13/21 0705 01/14/21 0624 01/15/21 0329 01/16/21 0300  WBC 10.6* 6.2 11.3* 10.6*  NEUTROABS  --   --  7.7 6.4  HGB 13.1 14.2 14.0 13.7  HCT 41.1 43.8 43.2 43.1  MCV 100.0 97.1 96.6 98.9  PLT 142* 187 253 241   Cardiac Enzymes: No results for input(s): CKTOTAL, CKMB, CKMBINDEX, TROPONINI in the last 168 hours. BNP: BNP (last 3 results) Recent Labs    06/04/20 1218  BNP 166.0*    ProBNP (last 3 results) No results for input(s): PROBNP in the last 8760 hours.  CBG: Recent Labs  Lab 01/18/21 0605 01/18/21 1128 01/18/21 1629 01/18/21 2128 01/19/21 0622  GLUCAP 121* 228* 112* 148* 117*        Signed:  Irine Seal MD.  [facility] 01/19/2021, 9:01 AM

## 2021-01-19 NOTE — Progress Notes (Addendum)
Discharge instructions reviewed with pt and family member.  Copy of instructions and scripts given to pt/family to give to facility when pt arrives. (Copy of AVS and scripts placed in d/c envelope for facility), copy of AVS also provided to family.  Pt d/c'd via wheelchair with belongings, with family.           Escorted by unit NT.

## 2021-01-19 NOTE — TOC Transition Note (Signed)
Transition of Care [facility]) - CM/SW Discharge Note   Patient Details  Name: Deanna Salinas MRN: [id number] Date of Birth: Jan 04, 1936  Transition of Care [facility]) CM/SW Contact:  Emeterio Reeve, [place] Phone Number: 01/19/2021, 11:27 AM   Clinical Narrative:     Patient will DC to: [facility] Anticipated DC date: 01/19/21 Family notified: Children aware Transport by: Family car     Per MD patient ready for DC to [facility]. RN, patient, patient's family, and facility notified of DC. Discharge Summary and FL2 sent to facility. Family will transport by private car.   RN to call report to [phone], ask for [facility].   CSW will sign off for now as social work intervention is no longer needed. Please consult [place] again if new needs arise.   Final next level of care: Skilled Nursing Facility Barriers to Discharge: Barriers Resolved   Patient Goals and CMS Choice Patient states their goals for this hospitalization and ongoing recovery are:: to get better CMS Medicare.gov Compare Post Acute Care list provided to:: Patient Choice offered to / list presented to : Spouse, Adult Children, Patient  Discharge Placement              Patient chooses bed at: Other - please specify in the comment section below: [name]) Patient to be transferred to facility by: family car Name of family member notified: [name] Patient and family notified of of transfer: 01/19/21  Discharge Plan and Services                                     Social Determinants of Health (SDOH) Interventions     Readmission Risk Interventions No flowsheet data found.  Emeterio Reeve, Latanya Presser, [place] Social Worker [phone]

## 2021-01-20 NOTE — Telephone Encounter (Signed)
I sent the daughter a MyChart message The patient was discharged to skilled nursing facility but refused to go there Currently staying with family in [place] I am uncertain if physical therapy can be ordered there then transferred over to here if she comes back home But should family request physical therapy in [facility] May go ahead with a home health referral and physical therapy referral.  Certainly would be helpful if the family had in mind a local home health provider there in [place] or at least someone who is more statewide such aa Theodis Blaze Uncertain when she may be coming back home to [place]

## 2021-01-21 ENCOUNTER — Other Ambulatory Visit: Payer: Self-pay | Admitting: Student

## 2021-01-21 ENCOUNTER — Telehealth: Payer: Self-pay | Admitting: *Deleted

## 2021-01-21 ENCOUNTER — Other Ambulatory Visit: Payer: Self-pay | Admitting: *Deleted

## 2021-01-21 DIAGNOSIS — W19XXXA Unspecified fall, initial encounter: Secondary | ICD-10-CM

## 2021-01-21 MED ORDER — AMOXICILLIN-POT CLAVULANATE 875-125 MG PO TABS: ORAL_TABLET | ORAL | 0 refills | Status: DC

## 2021-01-21 MED ORDER — AMLODIPINE BESYLATE 2.5 MG PO TABS: 2.5000 mg | ORAL_TABLET | Freq: Every day | ORAL | 4 refills | Status: DC

## 2021-01-21 MED ORDER — TORSEMIDE 20 MG PO TABS: ORAL_TABLET | ORAL | 3 refills | Status: DC

## 2021-01-21 MED ORDER — POTASSIUM CHLORIDE CRYS ER 10 MEQ PO TBCR: EXTENDED_RELEASE_TABLET | ORAL | 3 refills | Status: DC

## 2021-01-21 MED ORDER — TORSEMIDE 20 MG PO TABS: 20.0000 mg | ORAL_TABLET | Freq: Every day | ORAL | 3 refills | Status: DC

## 2021-01-21 NOTE — Telephone Encounter (Signed)
Nurses-please make sure amlodipine is refilled 30-day with 4 additional refills through the pharmacy May also go ahead with Augmentin 875 mg 1 twice daily for 7 days this is a powerful form of amoxicillin along with clavulanate which is a good medication for pneumonia take with a snack in some individuals can cause diarrhea if further trouble let us know

## 2021-01-21 NOTE — Telephone Encounter (Signed)
Nurses May Demadex 20 mg 1 every morning as needed swelling, #30, 3 refills Potassium 10 mill equivalent 1 twice daily as needed when using Demadex, #60, 3 refills  As for temazepam ideally I would like for her to be off the temazepam.  She fell several weeks ago causing a fracture which led to all of this while she was on temazepam with oxycodone that she got from a different doctor.  But she has severe insomnia and it may be possible that she has to have the temazepam to get some sleep-but this temazepam does put her at risk of accidental falls.  Once again I would prefer for her to try a period of time without temazepam if possible.  May well need home health referral DM physical therapy referral especially if she is staying out that way for a period of time so that a skilled healthcare worker can come see her.  Otherwise I would like for her to make sure that she keeps a follow-up visit with [place] when she comes back to the [place] area.  Currently we have her on the schedule for June 27 at 11 AM but if it needs to be sooner than then please have family let us know.  Thanks-Dr. Nicki Reaper

## 2021-01-21 NOTE — Telephone Encounter (Signed)
I called and read her dr scott's messages and mychart messages was sent today about the antibiotic and meds.

## 2021-01-21 NOTE — Telephone Encounter (Signed)
Pt daughter is calling and wanting to know if she can get another round of antibiotic [phone] Pt is staying with her son in [place]   Call back [phone] Sabino Snipes daughter

## 2021-01-21 NOTE — Telephone Encounter (Signed)
Daughter sent a message that she did not see your mychart message so I called her and read through the messages. She then asked about this message she sent yesterday. Wants to know if she should be taking  Amlodipine, demadex, tramadol, temazepam and bayer contour next strips and they are all her med list but she has not been taking. Amlodipine was sent in today on an earlier message.   January 20, 2021  Nettie Elm to Kathyrn Drown, MD       8:01 PM Dr. Nicki Reaper, We wanted to ask if you could take a look at the discharge information on her medications. We were only given 4 prescriptions to fill. Metropol, QUEtiapine, Lidocaine patch and diclofenac sodium(Voltaren). In addition we purchased over-the-counter Colace, Thiamin, Folic Acid and Melatonin, Tylenol 500mg    We do not have the following medicines:  amlodopine,  Demadex, tramadol and temazepam and Bayer Contour Next Test strips.  These look to be medicines that she had been taking prior to hospitalization. We want to be sure that she has what she needs/should be taking.  If you feel she needs any of these, if you could please have them filled at the pharmacy [phone] [address] [place] [address] [place] [phone]. Thanks so much!

## 2021-01-21 NOTE — Addendum Note (Signed)
Addended by: Vicente Males on: 01/21/2021 02:17 PM   Modules accepted: Orders

## 2021-01-21 NOTE — Telephone Encounter (Signed)
Daughter wanted to know if Dr Nicki Reaper could resend the message that he sent to daughter to Deanna Salinas daughter can not find the message that was sent last night

## 2021-01-22 ENCOUNTER — Encounter: Payer: Self-pay | Admitting: Family Medicine

## 2021-01-22 ENCOUNTER — Other Ambulatory Visit: Payer: Self-pay | Admitting: *Deleted

## 2021-01-22 DIAGNOSIS — A419 Sepsis, unspecified organism: Secondary | ICD-10-CM

## 2021-01-22 DIAGNOSIS — J189 Pneumonia, unspecified organism: Secondary | ICD-10-CM

## 2021-01-22 DIAGNOSIS — R0902 Hypoxemia: Secondary | ICD-10-CM

## 2021-01-22 DIAGNOSIS — S2239XA Fracture of one rib, unspecified side, initial encounter for closed fracture: Secondary | ICD-10-CM

## 2021-01-22 NOTE — Telephone Encounter (Addendum)
Husband(DPR) advised per Dr Nicki Reaper: Please stop Quetiapine   May use temazepam 30 mg 1 nightly as needed for sleep be very mindful to be with her if she has to get up in the middle of the night to minimize the risk of falling and injuring herself  Husband verbalized understanding and stated they will check and see if she needs prescription and if so will send a message with the pharmacy they would like it sent to.

## 2021-01-22 NOTE — Telephone Encounter (Signed)
Please stop Quetiapine  May use temazepam 30 mg 1 nightly as needed for sleep be very mindful to be with her if she has to get up in the middle of the night to minimize the risk of falling and injuring herself  Nurses please connect with family may pend a 1 month prescription and I can send this in if they need the medicine.  I do not know if they have the medicine currently or not.  Thanks-Dr. Nicki Reaper

## 2021-01-22 NOTE — Telephone Encounter (Signed)
Home health and pt Referral also put in. Pt will come back home on 6/21

## 2021-01-22 NOTE — Telephone Encounter (Signed)
Called and discussed with pt's daughter Sabino Snipes and also sent through her mychart. Meds sent to pharm.

## 2021-01-22 NOTE — Addendum Note (Signed)
Addended by: Dairl Ponder on: 01/22/2021 05:12 PM   Modules accepted: Orders

## 2021-01-27 DIAGNOSIS — M5416 Radiculopathy, lumbar region: Secondary | ICD-10-CM | POA: Diagnosis not present

## 2021-01-28 ENCOUNTER — Encounter: Payer: Self-pay | Admitting: Family Medicine

## 2021-01-30 ENCOUNTER — Telehealth: Payer: Self-pay | Admitting: *Deleted

## 2021-01-30 NOTE — Telephone Encounter (Signed)
Patient has office visit on Monday we will discuss at that time thank you

## 2021-01-30 NOTE — Telephone Encounter (Signed)
Copied a cc chart from referral coordinator - [place]  Leafy Ro, Ermalene Postin, MD; Carmelina Noun, LPN Good Morning - I spoke with [facility] and they are willing to accept Patient but she will need some type of face to face visit - Vaughan Basta stated it could betelevisit or audio visit - they just need updated notes on the need for [facility] :)

## 2021-01-31 ENCOUNTER — Encounter: Payer: Self-pay | Admitting: Family Medicine

## 2021-02-02 ENCOUNTER — Other Ambulatory Visit: Payer: Self-pay

## 2021-02-02 ENCOUNTER — Telehealth (INDEPENDENT_AMBULATORY_CARE_PROVIDER_SITE_OTHER): Payer: Medicare Other | Admitting: Family Medicine

## 2021-02-02 ENCOUNTER — Encounter: Payer: Self-pay | Admitting: Family Medicine

## 2021-02-02 DIAGNOSIS — M79604 Pain in right leg: Secondary | ICD-10-CM | POA: Diagnosis not present

## 2021-02-02 DIAGNOSIS — E038 Other specified hypothyroidism: Secondary | ICD-10-CM | POA: Diagnosis not present

## 2021-02-02 DIAGNOSIS — G4700 Insomnia, unspecified: Secondary | ICD-10-CM

## 2021-02-02 DIAGNOSIS — E119 Type 2 diabetes mellitus without complications: Secondary | ICD-10-CM

## 2021-02-02 DIAGNOSIS — N289 Disorder of kidney and ureter, unspecified: Secondary | ICD-10-CM

## 2021-02-02 DIAGNOSIS — M79605 Pain in left leg: Secondary | ICD-10-CM

## 2021-02-02 MED ORDER — TRAMADOL HCL 50 MG PO TABS: ORAL_TABLET | ORAL | 0 refills | Status: DC

## 2021-02-02 MED ORDER — TEMAZEPAM 30 MG PO CAPS: ORAL_CAPSULE | ORAL | 2 refills | Status: DC

## 2021-02-02 MED ORDER — GLUCOSE BLOOD VI STRP: 1.0000 | ORAL_STRIP | 11 refills | Status: DC | PRN

## 2021-02-02 NOTE — Progress Notes (Addendum)
   Subjective:  I connected with  Deanna Salinas on 02/02/21 by a video enabled telemedicine application and verified that I am speaking with the correct person using two identifiers.   I discussed the limitations of evaluation and management by telemedicine. The patient expressed understanding and agreed to proceed.  Patient location: home  Provider location: in office  I provided 20 minutes of non face - to - face time during this encounter.   Patient ID: Deanna Salinas, female    DOB: [birth year], 85 y.o.   MRN: [id number]  HPI Patient recently in the hospital had fractured ribs then got pneumonia with potential sepsis was too weak to come home stayed with family for several days after hospitalization now doing some better Fall follow up   Review of Systems     Objective:   Physical Exam Today's visit was via telephone Physical exam was not possible for this visit       Assessment & Plan:  1. Subclinical hypothyroidism Check TSH - TSH - Comprehensive metabolic panel  2. Pain in both lower extremities Tylenol for pain avoid NSAIDs because of kidneys  3. Insomnia, unspecified type Temazepam at nighttime avoid other medications May use melatonin ideally she would not be on any medicine but even with temazepam she is only sleeping 2 hours per night she sleeps less if she does not take the temazepam  4. Renal insufficiency Healthy eating stay hydrated recheck labs before next visit - TSH - Comprehensive metabolic panel  5. Type 2 diabetes mellitus without complication, without long-term current use of insulin (HCC) Check sugars periodically on a daily basis follow-up for in person visit within the next few weeks - glucose blood test strip; 1 each by Other route as needed for other. Use as instructed  Dispense: 100 each; Refill: 11  Patient is homebound.  She sustained a fall with injury as well as having significant ataxia issues plus also weakness therefore patient would  benefit from home physical therapy and home health May move forward with this

## 2021-02-03 ENCOUNTER — Telehealth: Payer: Self-pay | Admitting: Family Medicine

## 2021-02-03 DIAGNOSIS — R296 Repeated falls: Secondary | ICD-10-CM

## 2021-02-03 DIAGNOSIS — R270 Ataxia, unspecified: Secondary | ICD-10-CM

## 2021-02-03 DIAGNOSIS — S2239XA Fracture of one rib, unspecified side, initial encounter for closed fracture: Secondary | ICD-10-CM

## 2021-02-03 DIAGNOSIS — R531 Weakness: Secondary | ICD-10-CM

## 2021-02-03 NOTE — Telephone Encounter (Signed)
Contacted Husband Adah Salvage. Husband states that pt needs Home health and physical therapy. PT is the biggest help she needs. Pt has occasional issues with hygiene.  Family does pitch in(pt has multiple family members with medical background)and that has helped Husband also states that the place she went to in [place] about a year or two helped her a lot.  Please advise. Thank you

## 2021-02-03 NOTE — Telephone Encounter (Signed)
May go ahead with consultation for home health and home physical therapy because of fractured ribs, ataxia, frequent falls and weakness  Documentation for yesterday's televisit included this

## 2021-02-03 NOTE — Telephone Encounter (Signed)
Referrals for home health and home PT have been placed. Pt husband is aware

## 2021-02-03 NOTE — Telephone Encounter (Signed)
Nurses  Recently patient was discharged from the hospital.  She was staying with family members who at that time requested home health.  She is now back home.  We did a televisit yesterday.  Please find out from family/patient/husband does she feel she needs home health where she is currently?  Physical therapy currently?  If so we may need to put in consultation Please let me know thank you

## 2021-02-05 ENCOUNTER — Telehealth: Payer: Self-pay | Admitting: Family Medicine

## 2021-02-05 DIAGNOSIS — Z9181 History of falling: Secondary | ICD-10-CM | POA: Diagnosis not present

## 2021-02-05 DIAGNOSIS — R000 Tachycardia, unspecified: Secondary | ICD-10-CM | POA: Diagnosis not present

## 2021-02-05 DIAGNOSIS — E02 Subclinical iodine-deficiency hypothyroidism: Secondary | ICD-10-CM | POA: Diagnosis not present

## 2021-02-05 DIAGNOSIS — Z79891 Long term (current) use of opiate analgesic: Secondary | ICD-10-CM | POA: Diagnosis not present

## 2021-02-05 DIAGNOSIS — N289 Disorder of kidney and ureter, unspecified: Secondary | ICD-10-CM | POA: Diagnosis not present

## 2021-02-05 DIAGNOSIS — E785 Hyperlipidemia, unspecified: Secondary | ICD-10-CM | POA: Diagnosis not present

## 2021-02-05 DIAGNOSIS — F32A Depression, unspecified: Secondary | ICD-10-CM | POA: Diagnosis not present

## 2021-02-05 DIAGNOSIS — S2242XD Multiple fractures of ribs, left side, subsequent encounter for fracture with routine healing: Secondary | ICD-10-CM | POA: Diagnosis not present

## 2021-02-05 DIAGNOSIS — R296 Repeated falls: Secondary | ICD-10-CM | POA: Diagnosis not present

## 2021-02-05 DIAGNOSIS — Z8701 Personal history of pneumonia (recurrent): Secondary | ICD-10-CM | POA: Diagnosis not present

## 2021-02-05 DIAGNOSIS — M4800 Spinal stenosis, site unspecified: Secondary | ICD-10-CM | POA: Diagnosis not present

## 2021-02-05 DIAGNOSIS — E1121 Type 2 diabetes mellitus with diabetic nephropathy: Secondary | ICD-10-CM | POA: Diagnosis not present

## 2021-02-05 DIAGNOSIS — I10 Essential (primary) hypertension: Secondary | ICD-10-CM | POA: Diagnosis not present

## 2021-02-05 DIAGNOSIS — M79661 Pain in right lower leg: Secondary | ICD-10-CM | POA: Diagnosis not present

## 2021-02-05 DIAGNOSIS — R270 Ataxia, unspecified: Secondary | ICD-10-CM | POA: Diagnosis not present

## 2021-02-05 DIAGNOSIS — G4700 Insomnia, unspecified: Secondary | ICD-10-CM | POA: Diagnosis not present

## 2021-02-05 DIAGNOSIS — F419 Anxiety disorder, unspecified: Secondary | ICD-10-CM | POA: Diagnosis not present

## 2021-02-05 DIAGNOSIS — M79662 Pain in left lower leg: Secondary | ICD-10-CM | POA: Diagnosis not present

## 2021-02-05 DIAGNOSIS — W19XXXD Unspecified fall, subsequent encounter: Secondary | ICD-10-CM | POA: Diagnosis not present

## 2021-02-05 DIAGNOSIS — Z791 Long term (current) use of non-steroidal anti-inflammatories (NSAID): Secondary | ICD-10-CM | POA: Diagnosis not present

## 2021-02-05 NOTE — Telephone Encounter (Signed)
So if the swelling in the lower legs persist she needs office visit plain and simple  May have orders for home health

## 2021-02-05 NOTE — Telephone Encounter (Signed)
Kristi from [facility] requesting verbal orders to see pt once a week for 6 weeks with 3 prn visits. Nurse also wanted to let provider know that patient is having 4+ edema in feet and 3+ edema in ankle. Pt was sitting with legs dangling and nurse instructed her to elevated feet. Also pt is having numbness and tingling in both feet. Pt is taking Tylenol Arthritis 650 mg every 8 hours and also taking Tylenol 500 mg every 4 hours. Nurse asked pt has she ever mentioned this to [place] and pt advised that she had not because she get on different subjects. Please advise. Thank you  [phone]

## 2021-02-06 ENCOUNTER — Other Ambulatory Visit: Payer: Self-pay | Admitting: Nurse Practitioner

## 2021-02-06 NOTE — Telephone Encounter (Signed)
Left message to return call- Kristi at home health 02/06/21

## 2021-02-06 NOTE — Telephone Encounter (Signed)
Krisit returned call and verbalized understanding. Verbal orders given

## 2021-02-10 DIAGNOSIS — N289 Disorder of kidney and ureter, unspecified: Secondary | ICD-10-CM | POA: Diagnosis not present

## 2021-02-10 DIAGNOSIS — E038 Other specified hypothyroidism: Secondary | ICD-10-CM | POA: Diagnosis not present

## 2021-02-11 LAB — COMPREHENSIVE METABOLIC PANEL
ALT: 17 IU/L (ref 0–32)
AST: 18 IU/L (ref 0–40)
Albumin/Globulin Ratio: 1.6 (ref 1.2–2.2)
Albumin: 4.1 g/dL (ref 3.6–4.6)
Alkaline Phosphatase: 109 IU/L (ref 44–121)
BUN/Creatinine Ratio: 26 (ref 12–28)
BUN: 29 mg/dL — ABNORMAL HIGH (ref 8–27)
Bilirubin Total: 0.5 mg/dL (ref 0.0–1.2)
CO2: 28 mmol/L (ref 20–29)
Calcium: 9.6 mg/dL (ref 8.7–10.3)
Chloride: 96 mmol/L (ref 96–106)
Creatinine, Ser: 1.11 mg/dL — ABNORMAL HIGH (ref 0.57–1.00)
Globulin, Total: 2.5 g/dL (ref 1.5–4.5)
Glucose: 117 mg/dL — ABNORMAL HIGH (ref 65–99)
Potassium: 5 mmol/L (ref 3.5–5.2)
Sodium: 138 mmol/L (ref 134–144)
Total Protein: 6.6 g/dL (ref 6.0–8.5)
eGFR: 49 mL/min/{1.73_m2} — ABNORMAL LOW (ref >59)

## 2021-02-11 LAB — TSH: TSH: 8.89 u[IU]/mL — ABNORMAL HIGH (ref 0.450–4.500)

## 2021-02-12 DIAGNOSIS — I10 Essential (primary) hypertension: Secondary | ICD-10-CM | POA: Diagnosis not present

## 2021-02-12 DIAGNOSIS — S2242XD Multiple fractures of ribs, left side, subsequent encounter for fracture with routine healing: Secondary | ICD-10-CM | POA: Diagnosis not present

## 2021-02-12 DIAGNOSIS — M79662 Pain in left lower leg: Secondary | ICD-10-CM | POA: Diagnosis not present

## 2021-02-12 DIAGNOSIS — M79661 Pain in right lower leg: Secondary | ICD-10-CM | POA: Diagnosis not present

## 2021-02-12 DIAGNOSIS — M4800 Spinal stenosis, site unspecified: Secondary | ICD-10-CM | POA: Diagnosis not present

## 2021-02-12 DIAGNOSIS — R270 Ataxia, unspecified: Secondary | ICD-10-CM | POA: Diagnosis not present

## 2021-02-13 DIAGNOSIS — M79662 Pain in left lower leg: Secondary | ICD-10-CM | POA: Diagnosis not present

## 2021-02-13 DIAGNOSIS — M79661 Pain in right lower leg: Secondary | ICD-10-CM | POA: Diagnosis not present

## 2021-02-13 DIAGNOSIS — M4800 Spinal stenosis, site unspecified: Secondary | ICD-10-CM | POA: Diagnosis not present

## 2021-02-13 DIAGNOSIS — S2242XD Multiple fractures of ribs, left side, subsequent encounter for fracture with routine healing: Secondary | ICD-10-CM | POA: Diagnosis not present

## 2021-02-13 DIAGNOSIS — I10 Essential (primary) hypertension: Secondary | ICD-10-CM | POA: Diagnosis not present

## 2021-02-13 DIAGNOSIS — R270 Ataxia, unspecified: Secondary | ICD-10-CM | POA: Diagnosis not present

## 2021-02-17 ENCOUNTER — Other Ambulatory Visit: Payer: Self-pay | Admitting: Family Medicine

## 2021-02-17 DIAGNOSIS — M79662 Pain in left lower leg: Secondary | ICD-10-CM | POA: Diagnosis not present

## 2021-02-17 DIAGNOSIS — M79661 Pain in right lower leg: Secondary | ICD-10-CM | POA: Diagnosis not present

## 2021-02-17 DIAGNOSIS — R270 Ataxia, unspecified: Secondary | ICD-10-CM | POA: Diagnosis not present

## 2021-02-17 DIAGNOSIS — I10 Essential (primary) hypertension: Secondary | ICD-10-CM | POA: Diagnosis not present

## 2021-02-17 DIAGNOSIS — M4800 Spinal stenosis, site unspecified: Secondary | ICD-10-CM | POA: Diagnosis not present

## 2021-02-17 DIAGNOSIS — S2242XD Multiple fractures of ribs, left side, subsequent encounter for fracture with routine healing: Secondary | ICD-10-CM | POA: Diagnosis not present

## 2021-02-17 NOTE — Telephone Encounter (Signed)
Patient is on 10 mill equivalent twice daily and stop the 20 mg

## 2021-02-18 DIAGNOSIS — M4800 Spinal stenosis, site unspecified: Secondary | ICD-10-CM | POA: Diagnosis not present

## 2021-02-18 DIAGNOSIS — M4316 Spondylolisthesis, lumbar region: Secondary | ICD-10-CM | POA: Diagnosis not present

## 2021-02-18 DIAGNOSIS — I10 Essential (primary) hypertension: Secondary | ICD-10-CM | POA: Diagnosis not present

## 2021-02-18 DIAGNOSIS — Z6833 Body mass index (BMI) 33.0-33.9, adult: Secondary | ICD-10-CM | POA: Diagnosis not present

## 2021-02-18 DIAGNOSIS — M79661 Pain in right lower leg: Secondary | ICD-10-CM | POA: Diagnosis not present

## 2021-02-18 DIAGNOSIS — M79662 Pain in left lower leg: Secondary | ICD-10-CM | POA: Diagnosis not present

## 2021-02-18 DIAGNOSIS — R270 Ataxia, unspecified: Secondary | ICD-10-CM | POA: Diagnosis not present

## 2021-02-18 DIAGNOSIS — S2242XD Multiple fractures of ribs, left side, subsequent encounter for fracture with routine healing: Secondary | ICD-10-CM | POA: Diagnosis not present

## 2021-02-18 DIAGNOSIS — G894 Chronic pain syndrome: Secondary | ICD-10-CM | POA: Diagnosis not present

## 2021-02-18 DIAGNOSIS — M48062 Spinal stenosis, lumbar region with neurogenic claudication: Secondary | ICD-10-CM | POA: Diagnosis not present

## 2021-02-19 DIAGNOSIS — S2242XD Multiple fractures of ribs, left side, subsequent encounter for fracture with routine healing: Secondary | ICD-10-CM | POA: Diagnosis not present

## 2021-02-19 DIAGNOSIS — I10 Essential (primary) hypertension: Secondary | ICD-10-CM | POA: Diagnosis not present

## 2021-02-19 DIAGNOSIS — R270 Ataxia, unspecified: Secondary | ICD-10-CM | POA: Diagnosis not present

## 2021-02-19 DIAGNOSIS — M4800 Spinal stenosis, site unspecified: Secondary | ICD-10-CM | POA: Diagnosis not present

## 2021-02-19 DIAGNOSIS — M79661 Pain in right lower leg: Secondary | ICD-10-CM | POA: Diagnosis not present

## 2021-02-19 DIAGNOSIS — M79662 Pain in left lower leg: Secondary | ICD-10-CM | POA: Diagnosis not present

## 2021-02-20 ENCOUNTER — Other Ambulatory Visit: Payer: Self-pay | Admitting: Family Medicine

## 2021-02-24 ENCOUNTER — Other Ambulatory Visit: Payer: Self-pay | Admitting: Family Medicine

## 2021-02-26 DIAGNOSIS — R270 Ataxia, unspecified: Secondary | ICD-10-CM | POA: Diagnosis not present

## 2021-02-26 DIAGNOSIS — S2242XD Multiple fractures of ribs, left side, subsequent encounter for fracture with routine healing: Secondary | ICD-10-CM | POA: Diagnosis not present

## 2021-02-26 DIAGNOSIS — I10 Essential (primary) hypertension: Secondary | ICD-10-CM | POA: Diagnosis not present

## 2021-02-26 DIAGNOSIS — M4800 Spinal stenosis, site unspecified: Secondary | ICD-10-CM | POA: Diagnosis not present

## 2021-02-26 DIAGNOSIS — M79661 Pain in right lower leg: Secondary | ICD-10-CM | POA: Diagnosis not present

## 2021-02-26 DIAGNOSIS — M79662 Pain in left lower leg: Secondary | ICD-10-CM | POA: Diagnosis not present

## 2021-03-02 DIAGNOSIS — M4800 Spinal stenosis, site unspecified: Secondary | ICD-10-CM | POA: Diagnosis not present

## 2021-03-02 DIAGNOSIS — S2242XD Multiple fractures of ribs, left side, subsequent encounter for fracture with routine healing: Secondary | ICD-10-CM | POA: Diagnosis not present

## 2021-03-03 DIAGNOSIS — M79662 Pain in left lower leg: Secondary | ICD-10-CM | POA: Diagnosis not present

## 2021-03-03 DIAGNOSIS — S2242XD Multiple fractures of ribs, left side, subsequent encounter for fracture with routine healing: Secondary | ICD-10-CM | POA: Diagnosis not present

## 2021-03-03 DIAGNOSIS — R270 Ataxia, unspecified: Secondary | ICD-10-CM | POA: Diagnosis not present

## 2021-03-03 DIAGNOSIS — M4800 Spinal stenosis, site unspecified: Secondary | ICD-10-CM | POA: Diagnosis not present

## 2021-03-03 DIAGNOSIS — M79661 Pain in right lower leg: Secondary | ICD-10-CM | POA: Diagnosis not present

## 2021-03-03 DIAGNOSIS — I10 Essential (primary) hypertension: Secondary | ICD-10-CM | POA: Diagnosis not present

## 2021-03-09 ENCOUNTER — Ambulatory Visit (INDEPENDENT_AMBULATORY_CARE_PROVIDER_SITE_OTHER): Payer: Medicare Other | Admitting: Family Medicine

## 2021-03-09 ENCOUNTER — Other Ambulatory Visit: Payer: Self-pay

## 2021-03-09 VITALS — BP 132/83 | HR 76 | Temp 97.7°F | Ht 65.0 in | Wt 218.0 lb

## 2021-03-09 DIAGNOSIS — M2559 Pain in other specified joint: Secondary | ICD-10-CM

## 2021-03-09 DIAGNOSIS — H9193 Unspecified hearing loss, bilateral: Secondary | ICD-10-CM | POA: Diagnosis not present

## 2021-03-09 DIAGNOSIS — M79605 Pain in left leg: Secondary | ICD-10-CM

## 2021-03-09 DIAGNOSIS — E119 Type 2 diabetes mellitus without complications: Secondary | ICD-10-CM

## 2021-03-09 DIAGNOSIS — R600 Localized edema: Secondary | ICD-10-CM | POA: Diagnosis not present

## 2021-03-09 DIAGNOSIS — M79604 Pain in right leg: Secondary | ICD-10-CM | POA: Diagnosis not present

## 2021-03-09 DIAGNOSIS — G4700 Insomnia, unspecified: Secondary | ICD-10-CM | POA: Diagnosis not present

## 2021-03-09 DIAGNOSIS — I10 Essential (primary) hypertension: Secondary | ICD-10-CM | POA: Diagnosis not present

## 2021-03-09 DIAGNOSIS — M791 Myalgia, unspecified site: Secondary | ICD-10-CM | POA: Diagnosis not present

## 2021-03-09 DIAGNOSIS — E038 Other specified hypothyroidism: Secondary | ICD-10-CM | POA: Diagnosis not present

## 2021-03-09 DIAGNOSIS — T466X5A Adverse effect of antihyperlipidemic and antiarteriosclerotic drugs, initial encounter: Secondary | ICD-10-CM | POA: Diagnosis not present

## 2021-03-09 MED ORDER — TEMAZEPAM 30 MG PO CAPS: 30.0000 mg | ORAL_CAPSULE | Freq: Every evening | ORAL | 5 refills | Status: DC | PRN

## 2021-03-09 MED ORDER — POTASSIUM CHLORIDE CRYS ER 10 MEQ PO TBCR: EXTENDED_RELEASE_TABLET | ORAL | 3 refills | Status: DC

## 2021-03-09 MED ORDER — METOPROLOL TARTRATE 25 MG PO TABS: 12.5000 mg | ORAL_TABLET | Freq: Two times a day (BID) | ORAL | 3 refills | Status: DC

## 2021-03-09 MED ORDER — AMLODIPINE BESYLATE 2.5 MG PO TABS: 2.5000 mg | ORAL_TABLET | Freq: Every day | ORAL | 5 refills | Status: DC

## 2021-03-09 MED ORDER — GLUCOSE BLOOD VI STRP: ORAL_STRIP | 11 refills | Status: DC

## 2021-03-09 MED ORDER — TEMAZEPAM 30 MG PO CAPS: ORAL_CAPSULE | ORAL | 5 refills | Status: DC

## 2021-03-09 NOTE — Progress Notes (Signed)
Subjective:    Patient ID: Deanna Salinas, female    DOB: [birth year], 85 y.o.   MRN: [id number]  HPI  2 week follow up face to face  Hospital stay 6-04 to 6/13 Sleep Pain We did discuss her hospital visit She had rib fractures Then she had pneumonia She is trying to do the best she can with these setbacks  We did review her lab work including subclinical hypothyroidism hold off on medication currently She also has intermittent arthralgia  Bilateral hearing loss, unspecified hearing loss type - Plan: Microalbumin / creatinine urine ratio, C-reactive protein, Basic metabolic panel, Hemoglobin A1c, amLODipine (NORVASC) 2.5 MG tablet, metoprolol tartrate (LOPRESSOR) 25 MG tablet, potassium chloride (KLOR-CON) 10 MEQ tablet, temazepam (RESTORIL) 30 MG capsule, DISCONTINUED: temazepam (RESTORIL) 30 MG capsule, DISCONTINUED: temazepam (RESTORIL) 30 MG capsule  Insomnia, unspecified type - Plan: Microalbumin / creatinine urine ratio, C-reactive protein, Basic metabolic panel, Hemoglobin A1c, amLODipine (NORVASC) 2.5 MG tablet, metoprolol tartrate (LOPRESSOR) 25 MG tablet, potassium chloride (KLOR-CON) 10 MEQ tablet, temazepam (RESTORIL) 30 MG capsule, DISCONTINUED: temazepam (RESTORIL) 30 MG capsule, DISCONTINUED: temazepam (RESTORIL) 30 MG capsule  Subclinical hypothyroidism - Plan: Microalbumin / creatinine urine ratio, C-reactive protein, Basic metabolic panel, Hemoglobin A1c, amLODipine (NORVASC) 2.5 MG tablet, metoprolol tartrate (LOPRESSOR) 25 MG tablet, potassium chloride (KLOR-CON) 10 MEQ tablet, temazepam (RESTORIL) 30 MG capsule, DISCONTINUED: temazepam (RESTORIL) 30 MG capsule, DISCONTINUED: temazepam (RESTORIL) 30 MG capsule  Pain in both lower extremities - Plan: Microalbumin / creatinine urine ratio, C-reactive protein, Basic metabolic panel, Hemoglobin A1c, amLODipine (NORVASC) 2.5 MG tablet, metoprolol tartrate (LOPRESSOR) 25 MG tablet, potassium chloride (KLOR-CON) 10 MEQ tablet,  temazepam (RESTORIL) 30 MG capsule, DISCONTINUED: temazepam (RESTORIL) 30 MG capsule, DISCONTINUED: temazepam (RESTORIL) 30 MG capsule  Pain in other joint - Plan: Microalbumin / creatinine urine ratio, C-reactive protein, Basic metabolic panel, Hemoglobin A1c, amLODipine (NORVASC) 2.5 MG tablet, metoprolol tartrate (LOPRESSOR) 25 MG tablet, potassium chloride (KLOR-CON) 10 MEQ tablet, temazepam (RESTORIL) 30 MG capsule, DISCONTINUED: temazepam (RESTORIL) 30 MG capsule, DISCONTINUED: temazepam (RESTORIL) 30 MG capsule  Myalgia due to statin - Plan: Microalbumin / creatinine urine ratio, C-reactive protein, Basic metabolic panel, Hemoglobin A1c, amLODipine (NORVASC) 2.5 MG tablet, metoprolol tartrate (LOPRESSOR) 25 MG tablet, potassium chloride (KLOR-CON) 10 MEQ tablet, temazepam (RESTORIL) 30 MG capsule, DISCONTINUED: temazepam (RESTORIL) 30 MG capsule, DISCONTINUED: temazepam (RESTORIL) 30 MG capsule  Type 2 diabetes mellitus without complication, without long-term current use of insulin (HCC) - Plan: Microalbumin / creatinine urine ratio, C-reactive protein, Basic metabolic panel, Hemoglobin A1c, amLODipine (NORVASC) 2.5 MG tablet, metoprolol tartrate (LOPRESSOR) 25 MG tablet, potassium chloride (KLOR-CON) 10 MEQ tablet, glucose blood test strip, temazepam (RESTORIL) 30 MG capsule, DISCONTINUED: temazepam (RESTORIL) 30 MG capsule, DISCONTINUED: temazepam (RESTORIL) 30 MG capsule   Review of Systems     Objective:   Physical Exam General-in no acute distress Eyes-no discharge Lungs-respiratory rate normal, CTA CV-no murmurs,RRR Extremities skin warm dry no edema Neuro grossly normal Behavior normal, alert  No sign of any CHF going on or significant edema in her ankles      Assessment & Plan:  1. Bilateral hearing loss, unspecified hearing loss type Severe hearing loss hearing aids may be of some help currently she gets by without - Microalbumin / creatinine urine ratio - C-reactive  protein - Basic metabolic panel - Hemoglobin A1c - amLODipine (NORVASC) 2.5 MG tablet; Take 1 tablet (2.5 mg total) by mouth daily.  Dispense: 30 tablet; Refill: 5 - metoprolol tartrate (LOPRESSOR) 25 MG  tablet; Take 0.5 tablets (12.5 mg total) by mouth 2 (two) times daily.  Dispense: 60 tablet; Refill: 3 - potassium chloride (KLOR-CON) 10 MEQ tablet; Take one bid prn when using demadex  Dispense: 60 tablet; Refill: 3 - temazepam (RESTORIL) 30 MG capsule; Take 1 capsule (30 mg total) by mouth at bedtime as needed for sleep. 1 nightly as needed  Dispense: 30 capsule; Refill: 5  2. Insomnia, unspecified type This is been a longstanding problem temazepam unfortunately is necessary along with melatonin gets her 5 hours of sleep which is the best she is done in years - Microalbumin / creatinine urine ratio - C-reactive protein - Basic metabolic panel - Hemoglobin A1c - amLODipine (NORVASC) 2.5 MG tablet; Take 1 tablet (2.5 mg total) by mouth daily.  Dispense: 30 tablet; Refill: 5 - metoprolol tartrate (LOPRESSOR) 25 MG tablet; Take 0.5 tablets (12.5 mg total) by mouth 2 (two) times daily.  Dispense: 60 tablet; Refill: 3 - potassium chloride (KLOR-CON) 10 MEQ tablet; Take one bid prn when using demadex  Dispense: 60 tablet; Refill: 3 - temazepam (RESTORIL) 30 MG capsule; Take 1 capsule (30 mg total) by mouth at bedtime as needed for sleep. 1 nightly as needed  Dispense: 30 capsule; Refill: 5  3. Subclinical hypothyroidism Number gradually goes up we will follow this with repeat this again in 6 to 12 months may need to be on medication at some point - Microalbumin / creatinine urine ratio - C-reactive protein - Basic metabolic panel - Hemoglobin A1c - amLODipine (NORVASC) 2.5 MG tablet; Take 1 tablet (2.5 mg total) by mouth daily.  Dispense: 30 tablet; Refill: 5 - metoprolol tartrate (LOPRESSOR) 25 MG tablet; Take 0.5 tablets (12.5 mg total) by mouth 2 (two) times daily.  Dispense: 60 tablet;  Refill: 3 - potassium chloride (KLOR-CON) 10 MEQ tablet; Take one bid prn when using demadex  Dispense: 60 tablet; Refill: 3 - temazepam (RESTORIL) 30 MG capsule; Take 1 capsule (30 mg total) by mouth at bedtime as needed for sleep. 1 nightly as needed  Dispense: 30 capsule; Refill: 5  4. Pain in both lower extremities Arthralgia recommend Tylenol or tramadol as needed.  Check lab work - Microalbumin / creatinine urine ratio - C-reactive protein - Basic metabolic panel - Hemoglobin A1c - amLODipine (NORVASC) 2.5 MG tablet; Take 1 tablet (2.5 mg total) by mouth daily.  Dispense: 30 tablet; Refill: 5 - metoprolol tartrate (LOPRESSOR) 25 MG tablet; Take 0.5 tablets (12.5 mg total) by mouth 2 (two) times daily.  Dispense: 60 tablet; Refill: 3 - potassium chloride (KLOR-CON) 10 MEQ tablet; Take one bid prn when using demadex  Dispense: 60 tablet; Refill: 3 - temazepam (RESTORIL) 30 MG capsule; Take 1 capsule (30 mg total) by mouth at bedtime as needed for sleep. 1 nightly as needed  Dispense: 30 capsule; Refill: 5  5. Pain in other joint Arthralgia check lab work - Microalbumin / creatinine urine ratio - C-reactive protein - Basic metabolic panel - Hemoglobin A1c - amLODipine (NORVASC) 2.5 MG tablet; Take 1 tablet (2.5 mg total) by mouth daily.  Dispense: 30 tablet; Refill: 5 - metoprolol tartrate (LOPRESSOR) 25 MG tablet; Take 0.5 tablets (12.5 mg total) by mouth 2 (two) times daily.  Dispense: 60 tablet; Refill: 3 - potassium chloride (KLOR-CON) 10 MEQ tablet; Take one bid prn when using demadex  Dispense: 60 tablet; Refill: 3 - temazepam (RESTORIL) 30 MG capsule; Take 1 capsule (30 mg total) by mouth at bedtime as needed for sleep. 1  nightly as needed  Dispense: 30 capsule; Refill: 5  6. Myalgia due to statin History of myalgia due to statin unfortunately does not tolerate statin - Microalbumin / creatinine urine ratio - C-reactive protein - Basic metabolic panel - Hemoglobin A1c -  amLODipine (NORVASC) 2.5 MG tablet; Take 1 tablet (2.5 mg total) by mouth daily.  Dispense: 30 tablet; Refill: 5 - metoprolol tartrate (LOPRESSOR) 25 MG tablet; Take 0.5 tablets (12.5 mg total) by mouth 2 (two) times daily.  Dispense: 60 tablet; Refill: 3 - potassium chloride (KLOR-CON) 10 MEQ tablet; Take one bid prn when using demadex  Dispense: 60 tablet; Refill: 3 - temazepam (RESTORIL) 30 MG capsule; Take 1 capsule (30 mg total) by mouth at bedtime as needed for sleep. 1 nightly as needed  Dispense: 30 capsule; Refill: 5  7. Type 2 diabetes mellitus without complication, without long-term current use of insulin ([place]) Diabetes decent control watch diet stay active.  Hold off on adding medicines right now the risk outweighs the benefits recheck A1c again in the future if it goes up we will have to add a medication does not tolerate metformin - Microalbumin / creatinine urine ratio - C-reactive protein - Basic metabolic panel - Hemoglobin A1c - amLODipine (NORVASC) 2.5 MG tablet; Take 1 tablet (2.5 mg total) by mouth daily.  Dispense: 30 tablet; Refill: 5 - metoprolol tartrate (LOPRESSOR) 25 MG tablet; Take 0.5 tablets (12.5 mg total) by mouth 2 (two) times daily.  Dispense: 60 tablet; Refill: 3 - potassium chloride (KLOR-CON) 10 MEQ tablet; Take one bid prn when using demadex  Dispense: 60 tablet; Refill: 3 - glucose blood test strip; Use to check glucose once daily  Dispense: 100 each; Refill: 11 - temazepam (RESTORIL) 30 MG capsule; Take 1 capsule (30 mg total) by mouth at bedtime as needed for sleep. 1 nightly as needed  Dispense: 30 capsule; Refill: 5  Patient to follow-up in 4 months

## 2021-03-09 NOTE — Patient Instructions (Signed)
Labs in mid Sept  Follow up OV in November

## 2021-03-12 DIAGNOSIS — M48062 Spinal stenosis, lumbar region with neurogenic claudication: Secondary | ICD-10-CM | POA: Diagnosis not present

## 2021-03-13 ENCOUNTER — Telehealth: Payer: Self-pay | Admitting: Family Medicine

## 2021-03-13 NOTE — Telephone Encounter (Signed)
Pt husband returned call. Husband states that insurance will not pay for script that has been transferred. New script written out and signed by Dr.Taylor. Faxed to [facility] [employer] [facility] [employer].

## 2021-03-13 NOTE — Telephone Encounter (Signed)
Spouse called in states that the glucose meter and test strips that were sent into [facility] on Monday needs to be sent to [facility]. He states that [place] can't fill the order and has advised them to have it sent to [facility].  CB# [phone]

## 2021-03-13 NOTE — Telephone Encounter (Signed)
Left message to return call [facility]; Larene Pickett states that [place] can call Larene Pickett and speak with pharmacist to get meter/strips transferred

## 2021-04-01 ENCOUNTER — Encounter: Payer: Self-pay | Admitting: Family Medicine

## 2021-04-01 DIAGNOSIS — M2559 Pain in other specified joint: Secondary | ICD-10-CM

## 2021-04-01 NOTE — Addendum Note (Signed)
Addended by: Dairl Ponder on: 04/01/2021 04:42 PM   Modules accepted: Orders

## 2021-04-01 NOTE — Telephone Encounter (Signed)
Nurses May go ahead with a consultation with rheumatology

## 2021-04-02 DIAGNOSIS — G894 Chronic pain syndrome: Secondary | ICD-10-CM | POA: Diagnosis not present

## 2021-04-02 DIAGNOSIS — M48061 Spinal stenosis, lumbar region without neurogenic claudication: Secondary | ICD-10-CM | POA: Diagnosis not present

## 2021-04-02 DIAGNOSIS — M5416 Radiculopathy, lumbar region: Secondary | ICD-10-CM | POA: Diagnosis not present

## 2021-04-08 ENCOUNTER — Encounter: Payer: Self-pay | Admitting: Family Medicine

## 2021-04-17 ENCOUNTER — Telehealth: Payer: Self-pay | Admitting: Family Medicine

## 2021-04-17 NOTE — Telephone Encounter (Signed)
LVM for pt to rtn  y call to schedule AWV with [facility]. Please schedule this appt if pt calls the office.   Thanks

## 2021-04-21 DIAGNOSIS — S2242XD Multiple fractures of ribs, left side, subsequent encounter for fracture with routine healing: Secondary | ICD-10-CM

## 2021-04-21 DIAGNOSIS — M4800 Spinal stenosis, site unspecified: Secondary | ICD-10-CM

## 2021-05-02 ENCOUNTER — Other Ambulatory Visit: Payer: Self-pay

## 2021-05-02 ENCOUNTER — Ambulatory Visit (INDEPENDENT_AMBULATORY_CARE_PROVIDER_SITE_OTHER): Payer: Medicare Other | Admitting: *Deleted

## 2021-05-02 DIAGNOSIS — Z0000 Encounter for general adult medical examination without abnormal findings: Secondary | ICD-10-CM | POA: Diagnosis not present

## 2021-05-02 NOTE — Progress Notes (Addendum)
Subjective:   Deanna Salinas is a 85 y.o. female who presents for an Initial Medicare Annual Wellness Visit.  I connected with  NURIA PHEBUS on 05/02/21 by a telephone and verified that I am speaking with the correct person using two identifiers.   I discussed the limitations, risks, security and privacy concerns of performing an evaluation and management service by telephone and the availability of in person appointments. The patient expressed understanding and agreed to proceed  Location of patient : Home Location of Provider: Home Persons participating: [name] (patient),Kateena Degroote (RMA)  Review of Systems    N/A Cardiac Risk Factors include: advanced age (>27men, >14 women)     Objective:    There were no vitals filed for this visit. There is no height or weight on file to calculate BMI.  Advanced Directives 05/02/2021 12/20/2020 06/04/2020 06/04/2020 06/04/2020 05/04/2019 07/01/2015  Does Patient Have a Medical Advance Directive? Yes No Yes Yes Yes Yes Yes  Type of Paramedic of [place];Living will - [place];Living will Healthcare Power of [place] of [place];Living will [place]  Does patient want to make changes to medical advance directive? - - - No - Patient declined - No - Patient declined -  Copy of [place] in Chart? - - No - copy requested No - copy requested - No - copy requested No - copy requested  Would patient like information on creating a medical advance directive? - No - Patient declined - - - - -  Pre-existing out of facility DNR order (yellow form or pink MOST form) - - - - - - -    Current Medications (verified) Outpatient Encounter Medications as of 05/02/2021  Medication Sig   albuterol (VENTOLIN HFA) 108 (90 Base) MCG/ACT inhaler Inhale 2 puffs into the lungs every 6 (six) hours as needed for wheezing or  shortness of breath.   amLODipine (NORVASC) 2.5 MG tablet Take 1 tablet (2.5 mg total) by mouth daily.   BAYER CONTOUR NEXT TEST test strip USE AS DIRECTED   diclofenac Sodium (VOLTAREN) 1 % GEL Apply 2 g topically 4 (four) times daily as needed (pain).   docusate sodium (COLACE) 100 MG capsule Take 1 capsule (100 mg total) by mouth 2 (two) times daily.   folic acid (FOLVITE) 1 MG tablet Take 1 tablet (1 mg total) by mouth daily.   glucose blood test strip 1 each by Other route as needed for other. Use as instructed   glucose blood test strip Use to check glucose once daily   guaiFENesin-dextromethorphan (ROBITUSSIN DM) 100-10 MG/5ML syrup Take 5 mLs by mouth every 4 (four) hours as needed for cough.   lidocaine (LIDODERM) 5 % Place 1 patch onto the skin daily. Remove & Discard patch within 12 hours or as directed by MD   melatonin 5 MG TABS Take 1 tablet (5 mg total) by mouth at bedtime.   metoprolol tartrate (LOPRESSOR) 25 MG tablet Take 0.5 tablets (12.5 mg total) by mouth 2 (two) times daily.   Multiple Vitamin (MULTI-VITAMIN DAILY PO) Take 1 tablet by mouth daily.   potassium chloride (KLOR-CON) 10 MEQ tablet Take one bid prn when using demadex   temazepam (RESTORIL) 30 MG capsule Take 1 capsule (30 mg total) by mouth at bedtime as needed for sleep. 1 nightly as needed   thiamine 100 MG tablet Take 1 tablet (100 mg total) by mouth  daily.   torsemide (DEMADEX) 20 MG tablet Take one tablet qam prn swelling   traMADol (ULTRAM) 50 MG tablet 1 twice daily as needed severe pain use sparingly not with temazepam   No facility-administered encounter medications on file as of 05/02/2021.    Allergies (verified) Ambien [zolpidem tartrate], Lipitor [atorvastatin], Metformin and related, Pravastatin, and Trazodone and nefazodone   History: Past Medical History:  Diagnosis Date   Essential hypertension    Fatty liver    Hyperlipidemia    Type 2 diabetes mellitus ([place])    Past Surgical History:   Procedure Laterality Date   APPENDECTOMY     At the time of hysterectomy   CESAREAN SECTION     X5   COLONOSCOPY     COLONOSCOPY N/A 06/11/2013   Colonic diverticulosis, hyperplastic polyps. no further screening colonoscopies recommended   ESOPHAGOGASTRODUODENOSCOPY N/A 02/20/2014   Procedure: ESOPHAGOGASTRODUODENOSCOPY (EGD);  Surgeon: Daneil Dolin, MD;  Location: [facility];  Service: Endoscopy;  Laterality: N/A;  11:00   Family History  Problem Relation Age of Onset   Heart Problems Father    Hypertension Other    Hypertension Other    Cancer Mother    Cancer Daughter        breast    Colon cancer Neg Hx    Social History   Socioeconomic History   Marital status: Married    Spouse name: Not on file   Number of children: 5   Years of education: Not on file   Highest education level: Not on file  Occupational History    Employer: RETIRED  Tobacco Use   Smoking status: Never   Smokeless tobacco: Never  Vaping Use   Vaping Use: Never used  Substance and Sexual Activity   Alcohol use: Yes    Alcohol/week: 3.0 standard drinks    Types: 3 Glasses of wine per week    Comment: occasional   Drug use: No   Sexual activity: Not on file  Other Topics Concern   Not on file  Social History Narrative   Not on file   Social Determinants of Health   Financial Resource Strain: Low Risk    Difficulty of Paying Living Expenses: Not hard at all  Food Insecurity: No Food Insecurity   Worried About Charity fundraiser in the Last Year: Never true   Ran Out of Food in the Last Year: Never true  Transportation Needs: No Transportation Needs   Lack of Transportation (Medical): No   Lack of Transportation (Non-Medical): No  Physical Activity: Inactive   Days of Exercise per Week: 0 days   Minutes of Exercise per Session: 0 min  Stress: No Stress Concern Present   Feeling of Stress : Only a little  Social Connections: Moderately Isolated   Frequency of Communication with  Friends and Family: More than three times a week   Frequency of Social Gatherings with Friends and Family: More than three times a week   Attends Religious Services: Never   Marine scientist or Organizations: No   Attends Music therapist: Never   Marital Status: Married    Tobacco Counseling Counseling given: Not Answered   Clinical Intake:  Pre-visit preparation completed: Yes  Pain : No/denies pain     Nutritional Risks: None Diabetes: No  How often do you need to have someone help you when you read instructions, pamphlets, or other written materials from your doctor or pharmacy?: 1 - Never  Diabetic? N/A  Interpreter Needed?: No      Activities of Daily Living In your present state of health, do you have any difficulty performing the following activities: 05/02/2021 01/10/2021  Hearing? Tempie Donning  Vision? Y Y  Difficulty concentrating or making decisions? N N  Walking or climbing stairs? Y Y  Dressing or bathing? N N  Doing errands, shopping? Y N  Preparing Food and eating ? N -  Using the Toilet? N -  In the past six months, have you accidently leaked urine? N -  Do you have problems with loss of bowel control? N -  Managing your Medications? N -  Managing your Finances? Y -  Housekeeping or managing your Housekeeping? N -  Some recent data might be hidden    Patient Care Team: Kathyrn Drown, MD as PCP - General (Family Medicine) Satira Sark, MD as PCP - Cardiology (Cardiology) Gala Romney Cristopher Estimable, MD as Consulting Physician (Gastroenterology)  Indicate any recent Medical Services you may have received from other than [facility] providers in the past year (date may be approximate).     Assessment:   This is a routine wellness examination for Tandy.  Hearing/Vision screen No results found.  Dietary issues and exercise activities discussed: Current Exercise Habits: The patient does not participate in regular exercise at present, Exercise  limited by: None identified   Goals Addressed   None   Depression Screen PHQ 2/9 Scores 05/02/2021 05/02/2021 03/09/2021 03/07/2018 03/02/2016 11/19/2015 09/12/2014  PHQ - 2 Score 1 1 0 0 0 0 0  PHQ- 9 Score 4 - - - - - -    Fall Risk Fall Risk  05/02/2021 03/09/2021 02/02/2021 12/18/2019 10/29/2019  Falls in the past year? 1 0 1 0 0  Comment - - - - -  Number falls in past yr: 1 0 0 - -  Injury with Fall? 1 0 1 - -  Risk for fall due to : History of fall(s);Impaired vision History of fall(s) History of fall(s) Impaired balance/gait -  Follow up Falls evaluation completed - Falls evaluation completed Falls evaluation completed -    FALL RISK PREVENTION PERTAINING TO THE HOME:  Any stairs in or around the home? Yes  If so, are there any without handrails? Yes  Home free of loose throw rugs in walkways, pet beds, electrical cords, etc? No  Adequate lighting in your home to reduce risk of falls? Yes   ASSISTIVE DEVICES UTILIZED TO PREVENT FALLS:  Life alert? No  Use of a cane, walker or w/c? Yes  Grab bars in the bathroom? Yes  Shower chair or bench in shower? Yes  Elevated toilet seat or a handicapped toilet? Yes   TIMED UP AND GO:  Was the test performed? No .  Length of time to ambulate 10 feet: n/a sec.     Cognitive Function:     6CIT Screen 05/02/2021  What Year? 0 points  What month? 0 points  What time? 0 points  Count back from 20 0 points  Months in reverse 0 points  Repeat phrase 10 points  Total Score 10    Immunizations Immunization History  Administered Date(s) Administered   Fluad Quad(high Dose 65+) 06/05/2020   Influenza Split 05/31/2013   Influenza Whole 06/02/2006, 06/05/2007, 05/23/2008   Influenza,inj,Quad PF,6+ Mos 06/16/2015, 04/20/2017, 06/01/2018, 04/24/2019   Influenza-Unspecified 05/08/2012, 05/23/2014, 05/19/2016   PFIZER(Purple Top)SARS-COV-2 Vaccination 08/21/2019, 09/08/2019, 05/22/2020   Pneumococcal Conjugate-13 09/12/2014   Pneumococcal  Polysaccharide-23  06/08/2012   Td 02/17/2009   Zoster Recombinat (Shingrix) 09/13/2017, 05/11/2018   Zoster, Live 06/16/2003    TDAP status: Up to date  Flu Vaccine status: Up to date  Pneumococcal vaccine status: Up to date  Covid-19 vaccine status: Completed vaccines  Qualifies for Shingles Vaccine? Yes   Zostavax completed Yes   Shingrix Completed?: Yes  Screening Tests Health Maintenance  Topic Date Due   TETANUS/TDAP  02/18/2019   OPHTHALMOLOGY EXAM  04/10/2019   URINE MICROALBUMIN  10/16/2019   COVID-19 Vaccine (4 - Booster for Pfizer series) 08/14/2020   INFLUENZA VACCINE  03/09/2021   HEMOGLOBIN A1C  07/12/2021   FOOT EXAM  03/09/2022   DEXA SCAN  Completed   Zoster Vaccines- Shingrix  Completed   HPV VACCINES  Aged Out    Health Maintenance  Health Maintenance Due  Topic Date Due   TETANUS/TDAP  02/18/2019   OPHTHALMOLOGY EXAM  04/10/2019   URINE MICROALBUMIN  10/16/2019   COVID-19 Vaccine (4 - Booster for Pfizer series) 08/14/2020   INFLUENZA VACCINE  03/09/2021    Colorectal cancer screening: Type of screening: Colonoscopy. Completed 02/20/2014. Repeat every n/a years  Mammogram status: Completed 09/19/2014. Repeat every year  Bone Density status: Completed 08/29/2017. Results reflect: Bone density results: NORMAL. Repeat every n/a years.  Lung Cancer Screening: (Low Dose CT Chest recommended if Age 90-80 years, 30 pack-year currently smoking OR have quit w/in 15years.) does not qualify.   Lung Cancer Screening Referral: n/a  Additional Screening:  Hepatitis C Screening:  qualify; Completed does not aged out  Vision Screening: Recommended annual ophthalmology exams for early detection of glaucoma and other disorders of the eye. Is the patient up to date with their annual eye exam?  Yes  Who is the provider or what is the name of the office in which the patient attends annual eye exams? [facility] If pt is not established with a provider,  would they like to be referred to a provider to establish care? No .   Dental Screening: Recommended annual dental exams for proper oral hygiene  Community Resource Referral / Chronic Care Management: CRR required this visit?  No   CCM required this visit?  No      Plan:     I have personally reviewed and noted the following in the patient's chart:   Medical and social history Use of alcohol, tobacco or illicit drugs  Current medications and supplements including opioid prescriptions. Patient is not currently taking opioid prescriptions. Functional ability and status Nutritional status Physical activity Advanced directives List of other physicians Hospitalizations, surgeries, and ER visits in previous 12 months Vitals Screenings to include cognitive, depression, and falls Referrals and appointments  In addition, I have reviewed and discussed with patient certain preventive protocols, quality metrics, and best practice recommendations. A written personalized care plan for preventive services as well as general preventive health recommendations were provided to patient.     [place], [place]   05/02/2021   Nurse Notes: non face to face 1 hr visit

## 2021-05-02 NOTE — Patient Instructions (Signed)
Health Maintenance, Female Adopting a healthy lifestyle and getting preventive care are important in promoting health and wellness. Ask your health care provider about: The right schedule for you to have regular tests and exams. Things you can do on your own to prevent diseases and keep yourself healthy. What should I know about diet, weight, and exercise? Eat a healthy diet  Eat a diet that includes plenty of vegetables, fruits, low-fat dairy products, and lean protein. Do not eat a lot of foods that are high in solid fats, added sugars, or sodium. Maintain a healthy weight Body mass index (BMI) is used to identify weight problems. It estimates body fat based on height and weight. Your health care provider can help determine your BMI and help you achieve or maintain a healthy weight. Get regular exercise Get regular exercise. This is one of the most important things you can do for your health. Most adults should: Exercise for at least 150 minutes each week. The exercise should increase your heart rate and make you sweat (moderate-intensity exercise). Do strengthening exercises at least twice a week. This is in addition to the moderate-intensity exercise. Spend less time sitting. Even light physical activity can be beneficial. Watch cholesterol and blood lipids Have your blood tested for lipids and cholesterol at 83 years of age, then have this test every 5 years. Have your cholesterol levels checked more often if: Your lipid or cholesterol levels are high. You are older than 83 years of age. You are at high risk for heart disease. What should I know about cancer screening? Depending on your health history and family history, you may need to have cancer screening at various ages. This may include screening for: Breast cancer. Cervical cancer. Colorectal cancer. Skin cancer. Lung cancer. What should I know about heart disease, diabetes, and high blood pressure? Blood pressure and heart  disease High blood pressure causes heart disease and increases the risk of stroke. This is more likely to develop in people who have high blood pressure readings, are of African descent, or are overweight. Have your blood pressure checked: Every 3-5 years if you are 18-39 years of age. Every year if you are 40 years old or older. Diabetes Have regular diabetes screenings. This checks your fasting blood sugar level. Have the screening done: Once every three years after age 40 if you are at a normal weight and have a low risk for diabetes. More often and at a younger age if you are overweight or have a high risk for diabetes. What should I know about preventing infection? Hepatitis B If you have a higher risk for hepatitis B, you should be screened for this virus. Talk with your health care provider to find out if you are at risk for hepatitis B infection. Hepatitis C Testing is recommended for: Everyone born from 1945 through 1965. Anyone with known risk factors for hepatitis C. Sexually transmitted infections (STIs) Get screened for STIs, including gonorrhea and chlamydia, if: You are sexually active and are younger than 83 years of age. You are older than 83 years of age and your health care provider tells you that you are at risk for this type of infection. Your sexual activity has changed since you were last screened, and you are at increased risk for chlamydia or gonorrhea. Ask your health care provider if you are at risk. Ask your health care provider about whether you are at high risk for HIV. Your health care provider may recommend a prescription medicine   to help prevent HIV infection. If you choose to take medicine to prevent HIV, you should first get tested for HIV. You should then be tested every 3 months for as long as you are taking the medicine. Pregnancy If you are about to stop having your period (premenopausal) and you may become pregnant, seek counseling before you get  pregnant. Take 400 to 800 micrograms (mcg) of folic acid every day if you become pregnant. Ask for birth control (contraception) if you want to prevent pregnancy. Osteoporosis and menopause Osteoporosis is a disease in which the bones lose minerals and strength with aging. This can result in bone fractures. If you are 65 years old or older, or if you are at risk for osteoporosis and fractures, ask your health care provider if you should: Be screened for bone loss. Take a calcium or vitamin D supplement to lower your risk of fractures. Be given hormone replacement therapy (HRT) to treat symptoms of menopause. Follow these instructions at home: Lifestyle Do not use any products that contain nicotine or tobacco, such as cigarettes, e-cigarettes, and chewing tobacco. If you need help quitting, ask your health care provider. Do not use street drugs. Do not share needles. Ask your health care provider for help if you need support or information about quitting drugs. Alcohol use Do not drink alcohol if: Your health care provider tells you not to drink. You are pregnant, may be pregnant, or are planning to become pregnant. If you drink alcohol: Limit how much you use to 0-1 drink a day. Limit intake if you are breastfeeding. Be aware of how much alcohol is in your drink. In the [place] one drink equals one 12 oz bottle of beer (355 mL), one 5 oz glass of wine (148 mL), or one 1 oz glass of hard liquor (44 mL). General instructions Schedule regular health, dental, and eye exams. Stay current with your vaccines. Tell your health care provider if: You often feel depressed. You have ever been abused or do not feel safe at home. Summary Adopting a healthy lifestyle and getting preventive care are important in promoting health and wellness. Follow your health care provider's instructions about healthy diet, exercising, and getting tested or screened for diseases. Follow your health care provider's  instructions on monitoring your cholesterol and blood pressure. This information is not intended to replace advice given to you by your health care provider. Make sure you discuss any questions you have with your health care provider. Document Revised: 10/03/2020 Document Reviewed: 07/19/2018 Elsevier Patient Education  2022 Elsevier Inc.  

## 2021-05-22 DIAGNOSIS — M79605 Pain in left leg: Secondary | ICD-10-CM | POA: Diagnosis not present

## 2021-05-22 DIAGNOSIS — E038 Other specified hypothyroidism: Secondary | ICD-10-CM | POA: Diagnosis not present

## 2021-05-22 DIAGNOSIS — H9193 Unspecified hearing loss, bilateral: Secondary | ICD-10-CM | POA: Diagnosis not present

## 2021-05-22 DIAGNOSIS — T466X5A Adverse effect of antihyperlipidemic and antiarteriosclerotic drugs, initial encounter: Secondary | ICD-10-CM | POA: Diagnosis not present

## 2021-05-22 DIAGNOSIS — M2559 Pain in other specified joint: Secondary | ICD-10-CM | POA: Diagnosis not present

## 2021-05-22 DIAGNOSIS — G4700 Insomnia, unspecified: Secondary | ICD-10-CM | POA: Diagnosis not present

## 2021-05-22 DIAGNOSIS — Z23 Encounter for immunization: Secondary | ICD-10-CM | POA: Diagnosis not present

## 2021-05-22 DIAGNOSIS — E119 Type 2 diabetes mellitus without complications: Secondary | ICD-10-CM | POA: Diagnosis not present

## 2021-05-22 DIAGNOSIS — M791 Myalgia, unspecified site: Secondary | ICD-10-CM | POA: Diagnosis not present

## 2021-05-22 DIAGNOSIS — M79604 Pain in right leg: Secondary | ICD-10-CM | POA: Diagnosis not present

## 2021-05-23 LAB — BASIC METABOLIC PANEL
BUN/Creatinine Ratio: 23 (ref 12–28)
BUN: 20 mg/dL (ref 8–27)
CO2: 27 mmol/L (ref 20–29)
Calcium: 9.1 mg/dL (ref 8.7–10.3)
Chloride: 99 mmol/L (ref 96–106)
Creatinine, Ser: 0.86 mg/dL (ref 0.57–1.00)
Glucose: 94 mg/dL (ref 70–99)
Potassium: 4.6 mmol/L (ref 3.5–5.2)
Sodium: 141 mmol/L (ref 134–144)
eGFR: 67 mL/min/{1.73_m2} (ref >59)

## 2021-05-23 LAB — HEMOGLOBIN A1C
Est. average glucose Bld gHb Est-mCnc: 140 mg/dL
Hgb A1c MFr Bld: 6.5 % — ABNORMAL HIGH (ref 4.8–5.6)

## 2021-05-23 LAB — MICROALBUMIN / CREATININE URINE RATIO

## 2021-05-23 LAB — C-REACTIVE PROTEIN: CRP: 5 mg/L (ref 0–10)

## 2021-05-25 NOTE — Progress Notes (Signed)
Office Visit Note  Patient: Deanna Salinas             Date of Birth: 15-Jun-1936           MRN: [id number]             PCP: Kathyrn Drown, MD Referring: Kathyrn Drown, MD Visit Date: 06/03/2021 Occupation: @GUAROCC @  Subjective:  No chief complaint on file.   History of Present Illness: Deanna Salinas is a 85 y.o. female seen in consultation per request of her PCP due to ongoing pain and discomfort in multiple joints.  Patient was accompanied by her husband today.  Due to hearing loss her husband gave most of the history.  On chart review, I had seen patient last in July 2019 for osteoarthritis.  At that time all autoimmune work-up was negative.  Today she reports that she has ongoing pain in her shoulders, elbows, wrists, hands, hips, knees, ankles and her feet.  She states that she gets cortisone injections in her back by Dr. Davy Pique for degenerative disc disease twice a year which are helpful.  Despite taking current medications she is in constant discomfort.  She reports swelling in her knee joints intermittently.  Activities of Daily Living:  Patient reports morning stiffness for all day. Patient Reports nocturnal pain.  Difficulty dressing/grooming: Reports Difficulty climbing stairs: Reports Difficulty getting out of chair: Reports Difficulty using hands for taps, buttons, cutlery, and/or writing: Reports  Review of Systems  Constitutional:  Positive for fatigue.  HENT:  Positive for hearing loss. Negative for mouth sores, mouth dryness and nose dryness.   Eyes:  Positive for dryness. Negative for pain and itching.  Respiratory:  Negative for shortness of breath and difficulty breathing.   Cardiovascular:  Negative for chest pain and palpitations.  Gastrointestinal:  Negative for blood in stool, constipation and diarrhea.  Endocrine: Negative for increased urination.  Genitourinary:  Negative for difficulty urinating.  Musculoskeletal:  Positive for joint pain, joint  pain, joint swelling, myalgias, morning stiffness, muscle tenderness and myalgias.  Skin:  Negative for color change, rash and redness.  Allergic/Immunologic: Negative for susceptible to infections.  Neurological:  Positive for dizziness. Negative for numbness, headaches, memory loss and weakness.  Hematological:  Positive for bruising/bleeding tendency.  Psychiatric/Behavioral:  Positive for sleep disturbance. Negative for confusion.    PMFS History:  Patient Active Problem List   Diagnosis Date Noted   Bilateral hearing loss 03/09/2021   Myalgia due to statin 03/09/2021   Community acquired pneumonia of left lower lobe of lung    Acute metabolic encephalopathy    E coli bacteremia    Sepsis due to pneumonia ([place]) 01/10/2021   Chest pain of uncertain etiology 68/34/1962   Rib fractures 01/10/2021   Bronchitis    Respiratory distress 06/04/2020   Hypoxia 06/04/2020   Reactive airway disease 06/04/2020   Primary osteoarthritis of both hands 01/23/2018   Primary osteoarthritis of both knees 01/23/2018   Primary osteoarthritis of both feet 01/23/2018   Other idiopathic scoliosis, lumbar region 01/23/2018   DDD (degenerative disc disease), lumbar 01/23/2018   Hypothyroidism 08/20/2017   Morbid obesity ([place]) 12/11/2016   Leukocytosis 06/18/2015   Type 2 diabetes mellitus ([place]) 06/16/2015   Abdominal pain, epigastric 02/18/2014   Elevated lipase 02/18/2014   Pancreatitis, acute 12/11/2013   POSTMENOPAUSAL OSTEOPOROSIS 02/17/2009   Osteoarthritis 03/29/2008   Chronic insomnia 09/14/2007   Hyperlipidemia 08/26/2006   DEPRESSION 08/26/2006   MIGRAINE HEADACHE 08/26/2006  CATARACT NOS 08/26/2006   Essential hypertension 08/26/2006   SCIATICA 08/26/2006    Past Medical History:  Diagnosis Date   Essential hypertension    Fatty liver    Hyperlipidemia    Type 2 diabetes mellitus ([place])     Family History  Problem Relation Age of Onset   Cancer Mother    Heart Problems Father     Congestive Heart Failure Father    Cancer Daughter        breast    Hypertension Other    Hypertension Other    Colon cancer Neg Hx    Past Surgical History:  Procedure Laterality Date   ABDOMINAL HYSTERECTOMY     APPENDECTOMY     At the time of hysterectomy   CESAREAN SECTION     X5   COLONOSCOPY     COLONOSCOPY N/A 06/11/2013   Colonic diverticulosis, hyperplastic polyps. no further screening colonoscopies recommended   ESOPHAGOGASTRODUODENOSCOPY N/A 02/20/2014   Procedure: ESOPHAGOGASTRODUODENOSCOPY (EGD);  Surgeon: Daneil Dolin, MD;  Location: [facility];  Service: Endoscopy;  Laterality: N/A;  11:00   Social History   Social History Narrative   Not on file   Immunization History  Administered Date(s) Administered   Fluad Quad(high Dose 65+) 06/05/2020   Influenza Split 05/31/2013   Influenza Whole 06/02/2006, 06/05/2007, 05/23/2008   Influenza,inj,Quad PF,6+ Mos 06/16/2015, 04/20/2017, 06/01/2018, 04/24/2019   Influenza-Unspecified 05/08/2012, 05/23/2014, 05/19/2016   PFIZER(Purple Top)SARS-COV-2 Vaccination 08/21/2019, 09/08/2019, 05/22/2020   Pneumococcal Conjugate-13 09/12/2014   Pneumococcal Polysaccharide-23 06/08/2012   Td 02/17/2009   Zoster Recombinat (Shingrix) 09/13/2017, 05/11/2018   Zoster, Live 06/16/2003     Objective: Vital Signs: BP (!) 154/83 (BP Location: Left Arm, Patient Position: Sitting, Cuff Size: Large)   Pulse 89   Ht 5\' 6"  (1.676 m) Comment: per patient  Wt 220 lb (99.8 kg) Comment: per patient  BMI 35.51 kg/m    Physical Exam Vitals and nursing note reviewed.  Constitutional:      Appearance: She is well-developed.  HENT:     Head: Normocephalic and atraumatic.  Eyes:     Conjunctiva/sclera: Conjunctivae normal.  Cardiovascular:     Rate and Rhythm: Normal rate and regular rhythm.     Heart sounds: Normal heart sounds.  Pulmonary:     Effort: Pulmonary effort is normal.     Breath sounds: Normal breath sounds.   Abdominal:     General: Bowel sounds are normal.     Palpations: Abdomen is soft.  Musculoskeletal:     Cervical back: Normal range of motion.     Right lower leg: Edema present.     Left lower leg: Edema present.  Lymphadenopathy:     Cervical: No cervical adenopathy.  Skin:    General: Skin is warm and dry.     Capillary Refill: Capillary refill takes less than 2 seconds.  Neurological:     Mental Status: She is alert and oriented to person, place, and time.  Psychiatric:        Behavior: Behavior normal.     Musculoskeletal Exam: C-spine was in good range of motion.  She had painful range of motion of bilateral shoulder joints with left shoulder joint abduction limited to 100 degrees.  Elbow joints wrist joints, MCPs PIPs and DIPs with good range of motion.  She has prominence of PIP and DIP joints consistent with osteoarthritis.  Hip joints were difficult to assess in the sitting position.  Knee joints were in full range  of motion without any warmth swelling or effusion.  There was no tenderness over ankles or MTPs.  CDAI Exam: CDAI Score: -- Patient Global: --; Provider Global: -- Swollen: --; Tender: -- Joint Exam 06/03/2021   No joint exam has been documented for this visit   There is currently no information documented on the homunculus. Go to the Rheumatology activity and complete the homunculus joint exam.  Investigation: No additional findings.  Imaging: No results found.  Recent Labs: Lab Results  Component Value Date   WBC 10.6 (H) 01/16/2021   HGB 13.7 01/16/2021   PLT 241 01/16/2021   NA 141 05/22/2021   K 4.6 05/22/2021   CL 99 05/22/2021   CO2 27 05/22/2021   GLUCOSE 94 05/22/2021   BUN 20 05/22/2021   CREATININE 0.86 05/22/2021   BILITOT 0.5 02/10/2021   ALKPHOS 109 02/10/2021   AST 18 02/10/2021   ALT 17 02/10/2021   PROT 6.6 02/10/2021   ALBUMIN 4.1 02/10/2021   CALCIUM 9.1 05/22/2021   GFRAA 63 06/13/2020    Speciality Comments: No  specialty comments available.  Procedures:  No procedures performed Allergies: Ambien [zolpidem tartrate], Lipitor [atorvastatin], Metformin and related, Pravastatin, and Trazodone and nefazodone   Assessment / Plan:     Visit Diagnoses: Polyarthralgia - 05/22/21: CRP 5.  She complains of pain and discomfort in all of her joints.  She states the pain is constant and she did not get relief with any of the medications.  She is unable to sleep due to nocturnal pain.  Primary osteoarthritis of both hands-she complains of pain and discomfort in the bilateral hands.  She had no synovitis on my examination.  I reviewed her x-rays from 2019 which showed osteoarthritis.  Chronic left shoulder pain-she complains of left shoulder joint pain and difficulty raising her arm.  I offered x-rays of her left shoulder joint which she declined.  I also offered cortisone injection which she declined.  Primary osteoarthritis of both knees-she complains of severe pain and discomfort in the bilateral knee joints.  She had severe osteoarthritis involving bilateral knee joints on the x-rays from 2019.  She declined x-rays today.  She may benefit from cortisone injections or Visco supplement injections but she stated that she would rather take a pain medication and then have injections in different joints.  Primary osteoarthritis of both feet-she complains of chronic discomfort in her feet.  There is no tenderness over ankles or MTPs.  She has significant pedal edema bilaterally.  DDD (degenerative disc disease), lumbar-she has disc disease of lumbar spine.  She has had cortisone injections by Dr. Davy Pique which has been helpful.  Other idiopathic scoliosis, lumbar region  Chronic pain syndrome-she is a lot of discomfort due to severe osteoarthritis involving multiple joints.  She had no synovitis on my examination.  I do not think autoimmune work-up is necessary.  She had autoimmune work-up in 2019 which was negative.  I  do not see any synovitis on my examination.  She is interested in getting a stronger medication for pain management.  I offered referral to pain management.  She states that she goes to Dr. Davy Pique and he should be able to prescribe stronger pain medications.  She may benefit from the use of Cymbalta.  Essential hypertension-blood pressure was mildly elevated today.  She believes states that is because she is not resting and is on oral discomfort.  History of hyperlipidemia  Myalgia due to statin-her CK was normal when checked in  2019.  Type 2 diabetes mellitus without complication, without long-term current use of insulin ([place])  History of pancreatitis  Sepsis due to pneumonia [facility]) - 01/10/21  Hx of migraines  Acute metabolic encephalopathy  History of hypothyroidism  Bilateral hearing loss-she has severe hearing loss.  We had difficulty with the communication.  Her husband helps with the history taking and also explaining the disease condition and management plan.  Orders: No orders of the defined types were placed in this encounter.  No orders of the defined types were placed in this encounter.   Follow-Up Instructions: Return if symptoms worsen or fail to improve, for Osteoarthritis.   Bo Merino, MD  Note - This record has been created using Editor, commissioning.  Chart creation errors have been sought, but may not always  have been located. Such creation errors do not reflect on  the standard of medical care.

## 2021-06-03 ENCOUNTER — Other Ambulatory Visit: Payer: Self-pay

## 2021-06-03 ENCOUNTER — Encounter: Payer: Self-pay | Admitting: Rheumatology

## 2021-06-03 ENCOUNTER — Other Ambulatory Visit: Payer: Medicare Other

## 2021-06-03 ENCOUNTER — Ambulatory Visit (INDEPENDENT_AMBULATORY_CARE_PROVIDER_SITE_OTHER): Payer: Medicare Other | Admitting: Rheumatology

## 2021-06-03 VITALS — BP 154/83 | HR 89 | Ht 66.0 in | Wt 220.0 lb

## 2021-06-03 DIAGNOSIS — M2550 Pain in unspecified joint: Secondary | ICD-10-CM | POA: Diagnosis not present

## 2021-06-03 DIAGNOSIS — Z8719 Personal history of other diseases of the digestive system: Secondary | ICD-10-CM

## 2021-06-03 DIAGNOSIS — H9193 Unspecified hearing loss, bilateral: Secondary | ICD-10-CM

## 2021-06-03 DIAGNOSIS — M170 Bilateral primary osteoarthritis of knee: Secondary | ICD-10-CM

## 2021-06-03 DIAGNOSIS — M19041 Primary osteoarthritis, right hand: Secondary | ICD-10-CM

## 2021-06-03 DIAGNOSIS — Z8639 Personal history of other endocrine, nutritional and metabolic disease: Secondary | ICD-10-CM

## 2021-06-03 DIAGNOSIS — M25512 Pain in left shoulder: Secondary | ICD-10-CM

## 2021-06-03 DIAGNOSIS — M19042 Primary osteoarthritis, left hand: Secondary | ICD-10-CM

## 2021-06-03 DIAGNOSIS — M791 Myalgia, unspecified site: Secondary | ICD-10-CM

## 2021-06-03 DIAGNOSIS — E119 Type 2 diabetes mellitus without complications: Secondary | ICD-10-CM

## 2021-06-03 DIAGNOSIS — M19071 Primary osteoarthritis, right ankle and foot: Secondary | ICD-10-CM | POA: Diagnosis not present

## 2021-06-03 DIAGNOSIS — Z8669 Personal history of other diseases of the nervous system and sense organs: Secondary | ICD-10-CM

## 2021-06-03 DIAGNOSIS — M5136 Other intervertebral disc degeneration, lumbar region: Secondary | ICD-10-CM | POA: Diagnosis not present

## 2021-06-03 DIAGNOSIS — J189 Pneumonia, unspecified organism: Secondary | ICD-10-CM

## 2021-06-03 DIAGNOSIS — G8929 Other chronic pain: Secondary | ICD-10-CM

## 2021-06-03 DIAGNOSIS — A419 Sepsis, unspecified organism: Secondary | ICD-10-CM

## 2021-06-03 DIAGNOSIS — M19072 Primary osteoarthritis, left ankle and foot: Secondary | ICD-10-CM

## 2021-06-03 DIAGNOSIS — G9341 Metabolic encephalopathy: Secondary | ICD-10-CM

## 2021-06-03 DIAGNOSIS — M4126 Other idiopathic scoliosis, lumbar region: Secondary | ICD-10-CM

## 2021-06-03 DIAGNOSIS — I10 Essential (primary) hypertension: Secondary | ICD-10-CM | POA: Diagnosis not present

## 2021-06-03 DIAGNOSIS — T466X5A Adverse effect of antihyperlipidemic and antiarteriosclerotic drugs, initial encounter: Secondary | ICD-10-CM

## 2021-06-03 DIAGNOSIS — G894 Chronic pain syndrome: Secondary | ICD-10-CM

## 2021-06-09 ENCOUNTER — Ambulatory Visit: Payer: Medicare Other | Admitting: Family Medicine

## 2021-06-09 ENCOUNTER — Other Ambulatory Visit: Payer: Self-pay | Admitting: Family Medicine

## 2021-06-22 DIAGNOSIS — M5416 Radiculopathy, lumbar region: Secondary | ICD-10-CM | POA: Diagnosis not present

## 2021-06-24 ENCOUNTER — Ambulatory Visit: Payer: Medicare Other | Admitting: Rheumatology

## 2021-07-09 ENCOUNTER — Encounter: Payer: Self-pay | Admitting: Rheumatology

## 2021-07-09 ENCOUNTER — Encounter: Payer: Self-pay | Admitting: Cardiology

## 2021-07-09 NOTE — Telephone Encounter (Signed)
We had detailed discussion regarding her condition at the last visit.  She will follow-up with her PCP regarding the treatment.  The notes were forwarded to her PCP.

## 2021-07-13 DIAGNOSIS — M5416 Radiculopathy, lumbar region: Secondary | ICD-10-CM | POA: Diagnosis not present

## 2021-08-04 ENCOUNTER — Telehealth: Payer: Self-pay | Admitting: Family Medicine

## 2021-08-04 DIAGNOSIS — Z7409 Other reduced mobility: Secondary | ICD-10-CM

## 2021-08-04 NOTE — Telephone Encounter (Signed)
Janett Billow granddaughter called in states patient was discharged from the hospital a while ago. She is asking if we can order Physical Therapy for patient.  CB# [phone]

## 2021-08-04 NOTE — Telephone Encounter (Signed)
May go ahead with physical therapy referral recommend discussing with family reason for referral to put back with the referral thank you

## 2021-08-04 NOTE — Telephone Encounter (Signed)
Left message to return call 

## 2021-08-04 NOTE — Telephone Encounter (Signed)
Please advise. Thank you

## 2021-08-05 NOTE — Telephone Encounter (Signed)
Granddaughter Janett Billow stated they are requesting PT because the patient is having trouble with mobility since hospitalization.  Referral for home health PT ordered in [place].

## 2021-08-16 ENCOUNTER — Encounter: Payer: Self-pay | Admitting: Family Medicine

## 2021-08-16 DIAGNOSIS — R531 Weakness: Secondary | ICD-10-CM

## 2021-08-16 DIAGNOSIS — M48062 Spinal stenosis, lumbar region with neurogenic claudication: Secondary | ICD-10-CM

## 2021-08-17 NOTE — Telephone Encounter (Signed)
I assume that this is physical therapy for her back That would be fine Referral for evaluation and treat

## 2021-08-19 DIAGNOSIS — M48062 Spinal stenosis, lumbar region with neurogenic claudication: Secondary | ICD-10-CM | POA: Diagnosis not present

## 2021-08-24 DIAGNOSIS — M48062 Spinal stenosis, lumbar region with neurogenic claudication: Secondary | ICD-10-CM | POA: Diagnosis not present

## 2021-08-25 DIAGNOSIS — M170 Bilateral primary osteoarthritis of knee: Secondary | ICD-10-CM | POA: Diagnosis not present

## 2021-08-26 DIAGNOSIS — M48062 Spinal stenosis, lumbar region with neurogenic claudication: Secondary | ICD-10-CM | POA: Diagnosis not present

## 2021-08-27 ENCOUNTER — Ambulatory Visit: Payer: Medicare Other | Admitting: Student

## 2021-08-28 DIAGNOSIS — M48062 Spinal stenosis, lumbar region with neurogenic claudication: Secondary | ICD-10-CM | POA: Diagnosis not present

## 2021-08-31 DIAGNOSIS — M48062 Spinal stenosis, lumbar region with neurogenic claudication: Secondary | ICD-10-CM | POA: Diagnosis not present

## 2021-09-02 ENCOUNTER — Encounter: Payer: Self-pay | Admitting: Family Medicine

## 2021-09-02 DIAGNOSIS — E7849 Other hyperlipidemia: Secondary | ICD-10-CM

## 2021-09-02 DIAGNOSIS — E119 Type 2 diabetes mellitus without complications: Secondary | ICD-10-CM

## 2021-09-02 DIAGNOSIS — R531 Weakness: Secondary | ICD-10-CM

## 2021-09-02 DIAGNOSIS — M48062 Spinal stenosis, lumbar region with neurogenic claudication: Secondary | ICD-10-CM | POA: Diagnosis not present

## 2021-09-02 DIAGNOSIS — I10 Essential (primary) hypertension: Secondary | ICD-10-CM

## 2021-09-02 DIAGNOSIS — R296 Repeated falls: Secondary | ICD-10-CM

## 2021-09-02 DIAGNOSIS — Z79899 Other long term (current) drug therapy: Secondary | ICD-10-CM

## 2021-09-02 NOTE — Telephone Encounter (Signed)
Nurses I am fine with the physical therapy consultation with [facility] They can do an evaluation and treat They may or may not have water therapy It is also necessary for the patient to do a in person visit within the next 3 to 4 weeks please set them thank you

## 2021-09-03 NOTE — Telephone Encounter (Signed)
Nurses I recommend TSH, free T4, W9N, metabolic 7, lipid, liver, urine ACR Diabetes, hyperlipidemia, hypothyroidism, hypertension

## 2021-09-05 ENCOUNTER — Other Ambulatory Visit: Payer: Self-pay | Admitting: Family Medicine

## 2021-09-05 DIAGNOSIS — G4700 Insomnia, unspecified: Secondary | ICD-10-CM

## 2021-09-09 ENCOUNTER — Ambulatory Visit: Payer: Medicare Other | Admitting: Family Medicine

## 2021-09-15 DIAGNOSIS — R531 Weakness: Secondary | ICD-10-CM | POA: Diagnosis not present

## 2021-09-15 DIAGNOSIS — R296 Repeated falls: Secondary | ICD-10-CM | POA: Diagnosis not present

## 2021-09-15 DIAGNOSIS — Z79899 Other long term (current) drug therapy: Secondary | ICD-10-CM | POA: Diagnosis not present

## 2021-09-15 DIAGNOSIS — E119 Type 2 diabetes mellitus without complications: Secondary | ICD-10-CM | POA: Diagnosis not present

## 2021-09-15 DIAGNOSIS — E7849 Other hyperlipidemia: Secondary | ICD-10-CM | POA: Diagnosis not present

## 2021-09-15 DIAGNOSIS — I10 Essential (primary) hypertension: Secondary | ICD-10-CM | POA: Diagnosis not present

## 2021-09-16 LAB — MICROALBUMIN / CREATININE URINE RATIO

## 2021-09-16 LAB — LIPID PANEL

## 2021-09-17 ENCOUNTER — Ambulatory Visit (HOSPITAL_COMMUNITY): Payer: Medicare Other | Attending: Family Medicine | Admitting: Physical Therapy

## 2021-09-17 ENCOUNTER — Encounter (HOSPITAL_COMMUNITY): Payer: Self-pay | Admitting: Physical Therapy

## 2021-09-17 ENCOUNTER — Other Ambulatory Visit: Payer: Self-pay

## 2021-09-17 DIAGNOSIS — R2689 Other abnormalities of gait and mobility: Secondary | ICD-10-CM | POA: Diagnosis not present

## 2021-09-17 DIAGNOSIS — M545 Low back pain, unspecified: Secondary | ICD-10-CM | POA: Insufficient documentation

## 2021-09-17 DIAGNOSIS — R296 Repeated falls: Secondary | ICD-10-CM | POA: Insufficient documentation

## 2021-09-17 DIAGNOSIS — M6281 Muscle weakness (generalized): Secondary | ICD-10-CM | POA: Diagnosis not present

## 2021-09-17 DIAGNOSIS — M25561 Pain in right knee: Secondary | ICD-10-CM | POA: Insufficient documentation

## 2021-09-17 DIAGNOSIS — R531 Weakness: Secondary | ICD-10-CM | POA: Insufficient documentation

## 2021-09-17 LAB — BASIC METABOLIC PANEL
BUN/Creatinine Ratio: 20 (ref 12–28)
BUN: 20 mg/dL (ref 8–27)
CO2: 27 mmol/L (ref 20–29)
Calcium: 9.9 mg/dL (ref 8.7–10.3)
Chloride: 98 mmol/L (ref 96–106)
Creatinine, Ser: 1.01 mg/dL — ABNORMAL HIGH (ref 0.57–1.00)
Glucose: 123 mg/dL — ABNORMAL HIGH (ref 70–99)
Potassium: 5.5 mmol/L — ABNORMAL HIGH (ref 3.5–5.2)
Sodium: 140 mmol/L (ref 134–144)
eGFR: 55 mL/min/{1.73_m2} — ABNORMAL LOW (ref >59)

## 2021-09-17 LAB — T4, FREE: Free T4: 1.25 ng/dL (ref 0.82–1.77)

## 2021-09-17 LAB — LIPID PANEL
Chol/HDL Ratio: 3.1 ratio (ref 0.0–4.4)
Cholesterol, Total: 183 mg/dL (ref 100–199)
HDL: 60 mg/dL (ref >39)
LDL Chol Calc (NIH): 104 mg/dL — ABNORMAL HIGH (ref 0–99)
Triglycerides: 107 mg/dL (ref 0–149)
VLDL Cholesterol Cal: 19 mg/dL (ref 5–40)

## 2021-09-17 LAB — HEPATIC FUNCTION PANEL
ALT: 12 IU/L (ref 0–32)
AST: 17 IU/L (ref 0–40)
Albumin: 4.2 g/dL (ref 3.6–4.6)
Alkaline Phosphatase: 136 IU/L — ABNORMAL HIGH (ref 44–121)
Bilirubin Total: 0.5 mg/dL (ref 0.0–1.2)
Bilirubin, Direct: 0.13 mg/dL (ref 0.00–0.40)
Total Protein: 6.5 g/dL (ref 6.0–8.5)

## 2021-09-17 LAB — HEMOGLOBIN A1C
Est. average glucose Bld gHb Est-mCnc: 143 mg/dL
Hgb A1c MFr Bld: 6.6 % — ABNORMAL HIGH (ref 4.8–5.6)

## 2021-09-17 LAB — TSH: TSH: 6.18 u[IU]/mL — ABNORMAL HIGH (ref 0.450–4.500)

## 2021-09-17 LAB — MICROALBUMIN / CREATININE URINE RATIO

## 2021-09-17 NOTE — Patient Instructions (Signed)
Access Code: [email or web address] URL: [email or web address] Date: 09/17/2021 Prepared by: Josue Stapel  Exercises Seated March - 2-3 x daily - 7 x weekly - 1 sets - 15 reps Seated Long Arc Quad - 2-3 x daily - 7 x weekly - 1 sets - 15 reps Seated Heel Toe Raises - 2-3 x daily - 7 x weekly - 1 sets - 15 reps

## 2021-09-17 NOTE — Therapy (Signed)
[place], [place], [address] Phone: [phone]   Fax:  [phone]  Physical Therapy Evaluation  Patient Details  Name: Deanna Salinas MRN: [id number] Date of Birth: 18-Jun-1936 Referring Provider (PT): Sallee Lange MD   Encounter Date: 09/17/2021   PT End of Session - 09/17/21 0929     Visit Number 1    Number of Visits 12    Date for PT Re-Evaluation 10/29/21    Authorization Type Medicare A/ BCBS supplement    Progress Note Due on Visit 10    PT Start Time 0903    PT Stop Time 0940    PT Time Calculation (min) 37 min    Activity Tolerance Patient limited by fatigue    Behavior During Therapy [facility] for tasks assessed/performed             Past Medical History:  Diagnosis Date   Essential hypertension    Fatty liver    Hyperlipidemia    Type 2 diabetes mellitus ([place])     Past Surgical History:  Procedure Laterality Date   ABDOMINAL HYSTERECTOMY     APPENDECTOMY     At the time of hysterectomy   CESAREAN SECTION     X5   COLONOSCOPY     COLONOSCOPY N/A 06/11/2013   Colonic diverticulosis, hyperplastic polyps. no further screening colonoscopies recommended   ESOPHAGOGASTRODUODENOSCOPY N/A 02/20/2014   Procedure: ESOPHAGOGASTRODUODENOSCOPY (EGD);  Surgeon: Daneil Dolin, MD;  Location: [facility];  Service: Endoscopy;  Laterality: N/A;  11:00    There were no vitals filed for this visit.    Subjective Assessment - 09/17/21 0909     Subjective Patient presents to therapy with chronic LBP, RT knee pain, gait and balance disturbance, also LE weakness. She reports longstanding history of back problems and has had therapy in the past for this. She was recently treated in [place] and had good success with strengthening. It sounds like she had some sort of integrative therapy including water therapy, needling and message. She really liked this. She wants to decrease her pain, improve her walking and  avoid having knee surgery.    Limitations Standing;Walking;House hold activities    Patient Stated Goals Decrease pain, walk better    Currently in Pain? Yes    Pain Score 6     Pain Location Back    Pain Orientation Lower;Posterior    Pain Descriptors / Indicators Aching    Pain Type Chronic pain    Pain Onset More than a month ago    Pain Frequency Constant    Aggravating Factors  standing, walking    Pain Relieving Factors pain meds, sitting    Effect of Pain on Daily Activities Limits                [facility] PT Assessment - 09/17/21 0001       Assessment   Medical Diagnosis Weakness and frequent falls    Referring Provider (PT) Sallee Lange MD    Prior Therapy Yes      Precautions   Precautions Fall      Restrictions   Weight Bearing Restrictions No      Balance Screen   Has the patient fallen in the past 6 months No      Prior Function   Level of Independence Independent with basic ADLs      Cognition   Overall Cognitive Status Within Functional Limits for tasks assessed  ROM / Strength   AROM / PROM / Strength AROM;Strength      Strength   Strength Assessment Site Hip;Knee;Ankle    Right/Left Hip Right;Left    Right Hip Flexion 4/5    Left Hip Flexion 4/5    Right/Left Knee Right;Left    Right Knee Extension 3+/5    Left Knee Extension 4/5    Right/Left Ankle Right;Left    Right Ankle Dorsiflexion 4/5    Left Ankle Dorsiflexion 4/5      Transfers   Transfers Sit to Stand    Sit to Stand 6: Modified independent (Device/Increase time)    Comments Very labored, heavy use of UEs from elevated surface rollator      Ambulation/Gait   Ambulation/Gait Yes    Ambulation/Gait Assistance 6: Modified independent (Device/Increase time);5: Supervision    Assistive device Rolling walker    Gait Pattern Decreased stride length;Decreased hip/knee flexion - right;Decreased hip/knee flexion - left;Trunk flexed    Ambulation Surface Level;Indoor    Gait  velocity Decreased                        Objective measurements completed on examination: See above findings.                PT Education - 09/17/21 0915     Education Details on evalution findings, POC and HEP    Person(s) Educated Patient    Methods Explanation;Handout    Comprehension Verbalized understanding              PT Short Term Goals - 09/17/21 0942       PT SHORT TERM GOAL #1   Title Patient will be independent with initial HEP and self-management strategies to improve functional outcomes    Time 3    Period Weeks    Status New    Target Date 10/08/21      PT SHORT TERM GOAL #2   Title Patient will report at least 30% overall improvement in subjective complaint to indicate improvement in ability to perform ADLs.    Time 3    Period Weeks    Status New    Target Date 10/08/21               PT Long Term Goals - 09/17/21 1000       PT LONG TERM GOAL #1   Title Patient will have equal to or > 4+/5 MMT in tested muscle groups throughout BLE to improve ability to perform functional mobility, stair ambulation and ADLs.    Time 6    Period Weeks    Status New    Target Date 10/29/21      PT LONG TERM GOAL #2   Title Patient will be able to perform stand x 5 in < 20 seconds to demonstrate improvement in functional mobility and reduced risk for falls.    Time 6    Period Weeks    Status New    Target Date 10/29/21      PT LONG TERM GOAL #3   Title Patient will be able to ambulate at least 275 feet during 2MWT with LRAD to demonstrate improved ability to perform functional mobility and associated tasks.    Time 6    Period Weeks    Status New    Target Date 10/29/21      PT LONG TERM GOAL #4   Title Patient will be able to stand > 10  minutes with pain not to exceed 3/10 in lumbar to improve ability to perform cooking and grooming ADLs.    Time 6    Period Weeks    Status New    Target Date 10/29/21                     Plan - 09/17/21 0935     Clinical Impression Statement Patient is an 86 y.o. female who presents to physical therapy with complaint of back pain, knee pain, weakness, balance and gait difficulty. Patient demonstrates decreased strength, ROM restriction, balance deficits and gait abnormalities which are likely contributing to symptoms of pain and are negatively impacting patient ability to perform ADLs and functional mobility tasks. Patient will benefit from skilled physical therapy services to address these deficits to reduce pain, improve level of function with ADLs, functional mobility tasks, and reduce risk for falls.    Examination-Activity Limitations Locomotion Level;Transfers    Examination-Participation Restrictions Community Activity;Shop;Cleaning    Stability/Clinical Decision Making Stable/Uncomplicated    Clinical Decision Making Low    Rehab Potential Fair    PT Frequency 2x / week    PT Duration 6 weeks    PT Treatment/Interventions ADLs/Self Care Home Management;Aquatic Therapy;Biofeedback;DME Instruction;Contrast Bath;Fluidtherapy;Parrafin;Ultrasound;Neuromuscular re-education;Compression bandaging;Scar mobilization;Passive range of motion;Patient/family education;Cryotherapy;Electrical Stimulation;Iontophoresis 4mg /ml Dexamethasone;Moist Heat;Traction;Balance training;Therapeutic exercise;Manual lymph drainage;Manual techniques;Orthotic Fit/Training;Gait training;Stair training;Functional mobility training;Therapeutic activities;Taping;Vasopneumatic Device;Vestibular;Splinting;Energy conservation;Dry needling;Joint Manipulations;Spinal Manipulations;Visual/perceptual remediation/compensation    PT Next Visit Plan Progress LE strength and core as able. Balance and gait    PT Home Exercise Plan Eval: seated march, LAQs, heel/ toe raises    Consulted and Agree with Plan of Care Patient;Family member/caregiver    Family Member Consulted Husband              Patient will benefit from skilled therapeutic intervention in order to improve the following deficits and impairments:  Abnormal gait, Decreased endurance, Decreased activity tolerance, Decreased strength, Pain, Increased fascial restricitons, Decreased balance, Decreased mobility, Difficulty walking, Postural dysfunction, Impaired flexibility, Decreased range of motion, Improper body mechanics  Visit Diagnosis: Muscle weakness (generalized)  Low back pain, unspecified back pain laterality, unspecified chronicity, unspecified whether sciatica present  Right knee pain, unspecified chronicity  Other abnormalities of gait and mobility     Problem List Patient Active Problem List   Diagnosis Date Noted   Bilateral hearing loss 03/09/2021   Myalgia due to statin 03/09/2021   Community acquired pneumonia of left lower lobe of lung    Acute metabolic encephalopathy    E coli bacteremia    Sepsis due to pneumonia ([place]) 01/10/2021   Chest pain of uncertain etiology 83/41/9622   Rib fractures 01/10/2021   Bronchitis    Respiratory distress 06/04/2020   Hypoxia 06/04/2020   Reactive airway disease 06/04/2020   Primary osteoarthritis of both hands 01/23/2018   Primary osteoarthritis of both knees 01/23/2018   Primary osteoarthritis of both feet 01/23/2018   Other idiopathic scoliosis, lumbar region 01/23/2018   DDD (degenerative disc disease), lumbar 01/23/2018   Hypothyroidism 08/20/2017   Morbid obesity ([place]) 12/11/2016   Leukocytosis 06/18/2015   Type 2 diabetes mellitus ([place]) 06/16/2015   Abdominal pain, epigastric 02/18/2014   Elevated lipase 02/18/2014   Pancreatitis, acute 12/11/2013   POSTMENOPAUSAL OSTEOPOROSIS 02/17/2009   Osteoarthritis 03/29/2008   Chronic insomnia 09/14/2007   Hyperlipidemia 08/26/2006   DEPRESSION 08/26/2006   MIGRAINE HEADACHE 08/26/2006   CATARACT NOS 08/26/2006   Essential hypertension 08/26/2006   SCIATICA 08/26/2006   10:04  AM,  09/17/21 Josue Wellbrock PT DPT  Physical Therapist with [facility]  [phone] [address] [address] [place], [place], [address] Phone: [phone]   Fax:  [phone]  Name: Deanna Salinas MRN: [id number] Date of Birth: [birth year]

## 2021-09-18 ENCOUNTER — Encounter: Payer: Self-pay | Admitting: Family Medicine

## 2021-09-18 ENCOUNTER — Ambulatory Visit (INDEPENDENT_AMBULATORY_CARE_PROVIDER_SITE_OTHER): Payer: Medicare Other | Admitting: Family Medicine

## 2021-09-18 ENCOUNTER — Other Ambulatory Visit: Payer: Self-pay

## 2021-09-18 VITALS — BP 140/82 | HR 104 | Temp 98.1°F | Ht 66.0 in | Wt 202.0 lb

## 2021-09-18 DIAGNOSIS — I10 Essential (primary) hypertension: Secondary | ICD-10-CM | POA: Diagnosis not present

## 2021-09-18 DIAGNOSIS — E119 Type 2 diabetes mellitus without complications: Secondary | ICD-10-CM

## 2021-09-18 MED ORDER — MUPIROCIN 2 % EX OINT: TOPICAL_OINTMENT | CUTANEOUS | 2 refills | Status: DC

## 2021-09-18 NOTE — Progress Notes (Signed)
Subjective:    Patient ID: Deanna Salinas, female    DOB: [birth year], 86 y.o.   MRN: [id number]  HPI 3 month follow up - patient would like to discuss arthritis and pain  Questions about medical records - questioning diagnosis in chart  Left ear place not healing  Very nice patient Has complex history She has a fair amount of arthritic pain and discomfort in her knees as well as her back and legs as well as hands.  Much of this is osteoarthritis.  There could be some level of fibromyalgia going on but hard to know for certain.  Patient is tried numerous approaches.  Currently taking tramadol and she states she benefits from this.  Denies feeling drowsy with it. She has severe insomnia that she has had for years.  On previous notes and discussions as well as MyChart messages she has reflected how this is been very miserable for her.  She has tried a variety of approaches including trying multiple different medications both over-the-counter and prescribed without success.  Currently we have her on temazepam.  This does help her with sleep for a few hours.  She also admits to drinking 2 ounces of gin each evening.  She denies abusing alcohol.  Does not feel she is addicted.  Review of Systems     Objective:   Physical Exam  Lungs clear heart regular pulse normal BP good on recheck extremities no edema      Assessment & Plan:  Insomnia-patient has severe insomnia.  This been going on for years.  She is tried numerous medications.  We have tried behavioral techniques which have not been successful Patient's quality of life was miserable.  She has discussed this at previous office visits.  She is also discussed this through [employer]. She does state that she drinks 2 ounces of gin at nighttime I encouraged her to cut that in half over the next week then stop It is dangerous for her to drink gin with the other medicine she is on Ideally she would not be on any sleep pill but her  situation was such to where usage of this was necessary versus allowing her not to sleep with a very difficult quality of life.  She realizes that this medication is optional. We have tried other medications.  I have even had a informal conversation with the geriatrician regarding her situation in the past who agreed that there were no perfect solutions.  Chronic pain-she has chronic pain in her legs hips lower back.  Also it gets worse as the day goes on tramadol does help to some degree.  I would encourage the patient not to escalate to higher pain medicines beyond what she is on currently.  Osteoarthritis both knees-she states that she may have knee replacement down in [place] near her family.  She also may end up needing to have knee ablation.  Avoid alcohol use-we did discuss that alcohol use with the other medicine she is on is no longer recommended at all.  She needs to taper off of this.  I do not feel that she is an alcoholic.  Medication cautions-she is on medications that can cause drowsiness she denies it causing drowsiness she states the tramadol helps her quality of life.  She states the sleeping medicine helps her get 3 to 4 hours of sleep although there are many nights where she just lays awake.  It is felt that the sleeping medicine does help although it  is not perfect.  Patient realizes limitations of this medicine.  Type 2 diabetes controlled via diet and activity-her A1c has risen some over the past several months but is being managed through diet and activity.  Holding off on medications currently.  Hypertension-blood pressure decent control with current measures  Hyperkalemia-recent lab work showed elevated potassium patient will minimize foods that have extra potassium in it and will repeat this in several weeks  Normal mental status no bipolar-patient's alertness is good her many mental status/cognitive assessment she passes, no sign of dementia  Hospital record concern-I  do not know where she would go to have parts of her records stricken-we will pose this question to our office manager  40 minutes spent with patient as well as looking at the records and documenting the above issues

## 2021-09-21 ENCOUNTER — Ambulatory Visit (HOSPITAL_COMMUNITY): Payer: Medicare Other | Admitting: Physical Therapy

## 2021-09-29 ENCOUNTER — Encounter (HOSPITAL_COMMUNITY): Payer: Medicare Other

## 2021-10-06 ENCOUNTER — Encounter (HOSPITAL_COMMUNITY): Payer: Medicare Other | Admitting: Physical Therapy

## 2021-10-06 ENCOUNTER — Other Ambulatory Visit: Payer: Self-pay | Admitting: Family Medicine

## 2021-10-06 DIAGNOSIS — G4700 Insomnia, unspecified: Secondary | ICD-10-CM

## 2021-10-08 ENCOUNTER — Encounter (HOSPITAL_COMMUNITY): Payer: Medicare Other

## 2021-10-13 ENCOUNTER — Encounter (HOSPITAL_COMMUNITY): Payer: Medicare Other | Admitting: Physical Therapy

## 2021-10-14 DIAGNOSIS — I10 Essential (primary) hypertension: Secondary | ICD-10-CM | POA: Diagnosis not present

## 2021-10-14 DIAGNOSIS — E119 Type 2 diabetes mellitus without complications: Secondary | ICD-10-CM | POA: Diagnosis not present

## 2021-10-14 DIAGNOSIS — E876 Hypokalemia: Secondary | ICD-10-CM | POA: Diagnosis not present

## 2021-10-15 ENCOUNTER — Encounter (HOSPITAL_COMMUNITY): Payer: Medicare Other

## 2021-10-15 LAB — BASIC METABOLIC PANEL
BUN/Creatinine Ratio: 28 (ref 12–28)
BUN: 26 mg/dL (ref 8–27)
CO2: 26 mmol/L (ref 20–29)
Calcium: 9.8 mg/dL (ref 8.7–10.3)
Chloride: 102 mmol/L (ref 96–106)
Creatinine, Ser: 0.93 mg/dL (ref 0.57–1.00)
Glucose: 95 mg/dL (ref 70–99)
Potassium: 5.7 mmol/L — ABNORMAL HIGH (ref 3.5–5.2)
Sodium: 142 mmol/L (ref 134–144)
eGFR: 61 mL/min/{1.73_m2} (ref >59)

## 2021-10-15 LAB — MAGNESIUM: Magnesium: 2.1 mg/dL (ref 1.6–2.3)

## 2021-10-19 ENCOUNTER — Ambulatory Visit (INDEPENDENT_AMBULATORY_CARE_PROVIDER_SITE_OTHER): Payer: Medicare Other | Admitting: Family Medicine

## 2021-10-19 ENCOUNTER — Other Ambulatory Visit: Payer: Self-pay

## 2021-10-19 VITALS — BP 128/86 | Ht 66.0 in | Wt 202.0 lb

## 2021-10-19 DIAGNOSIS — F5104 Psychophysiologic insomnia: Secondary | ICD-10-CM | POA: Diagnosis not present

## 2021-10-19 DIAGNOSIS — E119 Type 2 diabetes mellitus without complications: Secondary | ICD-10-CM

## 2021-10-19 DIAGNOSIS — R600 Localized edema: Secondary | ICD-10-CM

## 2021-10-19 DIAGNOSIS — G4700 Insomnia, unspecified: Secondary | ICD-10-CM | POA: Diagnosis not present

## 2021-10-19 DIAGNOSIS — I10 Essential (primary) hypertension: Secondary | ICD-10-CM | POA: Diagnosis not present

## 2021-10-19 DIAGNOSIS — E785 Hyperlipidemia, unspecified: Secondary | ICD-10-CM

## 2021-10-19 DIAGNOSIS — E038 Other specified hypothyroidism: Secondary | ICD-10-CM

## 2021-10-19 DIAGNOSIS — E1169 Type 2 diabetes mellitus with other specified complication: Secondary | ICD-10-CM | POA: Diagnosis not present

## 2021-10-19 DIAGNOSIS — E875 Hyperkalemia: Secondary | ICD-10-CM

## 2021-10-19 DIAGNOSIS — T466X5A Adverse effect of antihyperlipidemic and antiarteriosclerotic drugs, initial encounter: Secondary | ICD-10-CM

## 2021-10-19 DIAGNOSIS — M791 Myalgia, unspecified site: Secondary | ICD-10-CM | POA: Diagnosis not present

## 2021-10-19 MED ORDER — METOPROLOL TARTRATE 25 MG PO TABS: 12.5000 mg | ORAL_TABLET | Freq: Two times a day (BID) | ORAL | 3 refills | Status: DC

## 2021-10-19 MED ORDER — TEMAZEPAM 30 MG PO CAPS: ORAL_CAPSULE | ORAL | 5 refills | Status: DC

## 2021-10-19 MED ORDER — AMLODIPINE BESYLATE 2.5 MG PO TABS: 2.5000 mg | ORAL_TABLET | Freq: Every day | ORAL | 5 refills | Status: DC

## 2021-10-19 NOTE — Patient Instructions (Signed)
Results for orders placed or performed in visit on 09/18/21  ?Basic metabolic panel  ?Result Value Ref Range  ? Glucose 95 70 - 99 mg/dL  ? BUN 26 8 - 27 mg/dL  ? Creatinine, Ser 0.93 0.57 - 1.00 mg/dL  ? eGFR 61 >59 mL/min/1.73  ? BUN/Creatinine Ratio 28 12 - 28  ? Sodium 142 134 - 144 mmol/L  ? Potassium 5.7 (H) 3.5 - 5.2 mmol/L  ? Chloride 102 96 - 106 mmol/L  ? CO2 26 20 - 29 mmol/L  ? Calcium 9.8 8.7 - 10.3 mg/dL  ?Magnesium  ?Result Value Ref Range  ? Magnesium 2.1 1.6 - 2.3 mg/dL  ?Please eat a low potassium diet ?Stop any potassium supplements ?Repeat your blood work in 2 weeks ?Follow-up here in 4 months ?TakeCare-Dr. Nicki Reaper ? ?

## 2021-10-19 NOTE — Progress Notes (Signed)
? ?Subjective:  ? ? Patient ID: Deanna Salinas, female    DOB: [birth year], 86 y.o.   MRN: [id number] ? ?HPI ? ?Patient arrives for a follow up on potassium\. ?Results for orders placed or performed in visit on 09/18/21  ?Basic metabolic panel  ?Result Value Ref Range  ? Glucose 95 70 - 99 mg/dL  ? BUN 26 8 - 27 mg/dL  ? Creatinine, Ser 0.93 0.57 - 1.00 mg/dL  ? eGFR 61 >59 mL/min/1.73  ? BUN/Creatinine Ratio 28 12 - 28  ? Sodium 142 134 - 144 mmol/L  ? Potassium 5.7 (H) 3.5 - 5.2 mmol/L  ? Chloride 102 96 - 106 mmol/L  ? CO2 26 20 - 29 mmol/L  ? Calcium 9.8 8.7 - 10.3 mg/dL  ?Magnesium  ?Result Value Ref Range  ? Magnesium 2.1 1.6 - 2.3 mg/dL  ? ?Subclinical hypothyroidism ? ?Insomnia, unspecified type - Plan: temazepam (RESTORIL) 30 MG capsule ? ?Primary hypertension - Plan: amLODipine (NORVASC) 2.5 MG tablet, metoprolol tartrate (LOPRESSOR) 25 MG tablet ? ?Type 2 diabetes mellitus without complication, without long-term current use of insulin ([place]) ? ?Myalgia due to statin ? ?Hyperlipidemia associated with type 2 diabetes mellitus ([place]) ? ?Chronic insomnia ? ?Hyperkalemia - Plan: Basic Metabolic Panel (BMET) ? ?Pedal edema ? ?This patient overall is doing well ?She does take her medicine on a regular basis ?Tries to watch her diet avoids any low sugar spells ?States cannot tolerate statins due to muscle aches ?Does have a history of hyperlipidemia with LDL of 104 HDL looks very good at 60 A1c at 6.6 she also suffers with chronic insomnia with it which is treated with medications ?She also does best she can with healthy diet.  Has previous knee issues. ?Review of Systems ? ?   ?Objective:  ? Physical Exam ?General-in no acute distress ?Eyes-no discharge ?Lungs-respiratory rate normal, CTA ?CV-no murmurs,RRR ?Extremities skin warm dry mild edema in the lower legs worse on the left than the right ?Neuro grossly normal ?Behavior normal, alert ? ? ? ? ?   ?Assessment & Plan:  ?1. Insomnia, unspecified type ?She has had  chronic insomnia for years.  This is been very difficult for her she is tried numerous different medicines as well as behavioral techniques without success.  At 1 time she was even using alcohol to help her sleep.  Currently have her on Restoril.  We have discussed how that can increase her risk of falls and injuries but we also discussed how she stated she was so miserable with no sleep that she was going to use alcohol to help her sleep if we did not give her something.  We tried several different medicines and settled in on this medicine is being the one that is helped her the most but still so far with toleration and not causing early morning issues ?- temazepam (RESTORIL) 30 MG capsule; TAKE 1 CAPSULE BY MOUTH AT BEDTIME AS NEEDED FOR SLEEP.  Dispense: 30 capsule; Refill: 5 ? ?2. Primary hypertension ?Blood pressure decent control continue current measures ?- amLODipine (NORVASC) 2.5 MG tablet; Take 1 tablet (2.5 mg total) by mouth daily.  Dispense: 30 tablet; Refill: 5 ?- metoprolol tartrate (LOPRESSOR) 25 MG tablet; Take 0.5 tablets (12.5 mg total) by mouth 2 (two) times daily.  Dispense: 60 tablet; Refill: 3 ? ?3. Subclinical hypothyroidism ?Subclinical hypothyroidism monitor this every 6 months no levothyroxine indicated ? ?4. Type 2 diabetes mellitus without complication, without long-term current use of insulin ([place]) ?Diabetes  A1c 6.6 tolerating dietary measures as the approach currently.  Patient does not tolerate statins given her age holding off currently on injectables.  Her HDL looks great LDL slightly elevated at 104 ? ?5. Chronic insomnia ?See discussion above ? ?6. Hyperkalemia ?Avoid potassium supplements, avoid high potassium foods, fit in regular activity, recheck this in 2 weeks ?- Basic Metabolic Panel (BMET) ? ?7. Pedal edema ?Demadex on a as needed basis but her edema is not severe ? ?8. Hyperlipidemia associated with type 2 diabetes mellitus ([place]) ?Does not tolerate statins currently ?I do  not feel that the injectables more use in her current overall health ? ?Recheck in 4 months ? ?

## 2021-10-20 ENCOUNTER — Encounter (HOSPITAL_COMMUNITY): Payer: Medicare Other | Admitting: Physical Therapy

## 2021-10-22 ENCOUNTER — Encounter (HOSPITAL_COMMUNITY): Payer: Medicare Other

## 2021-10-26 IMAGING — CT CT HEAD W/O CM
3 series · 16 of 47 positions shown, 19 images · non-contrast
Comparison: None.

CLINICAL DATA: Left-sided pain post fall

EXAM:
CT HEAD WITHOUT CONTRAST
TECHNIQUE: Contiguous axial images were obtained from the base of the skull
through the vertex without intravenous contrast.

[Series 2: head w o · axial · 0.51mm/px · z∈[-6,+139]mm · 10 of 35 slices shown, 13 images]
[im 3/35  brain]
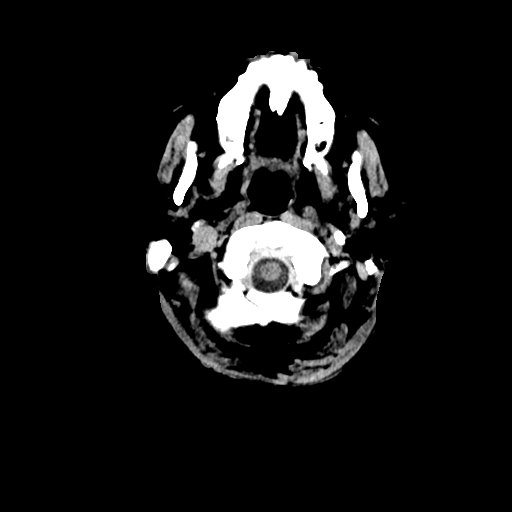
[im 3/35  bone]
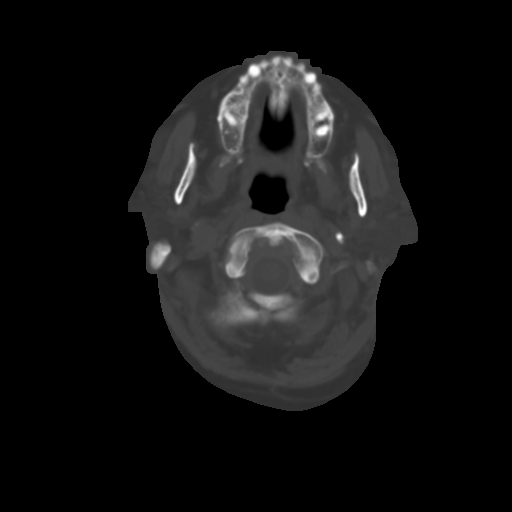
[im 6/35  brain]
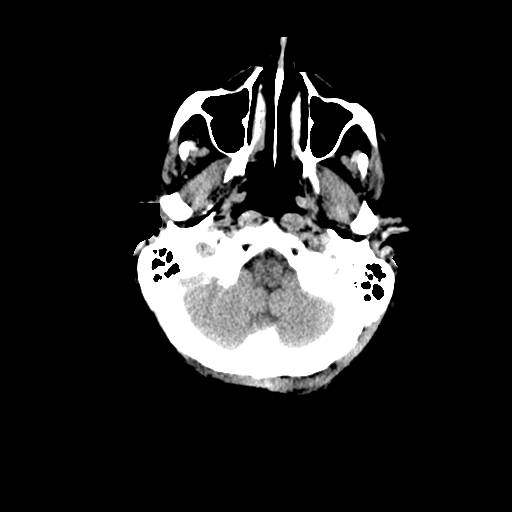
[im 10/35  brain]
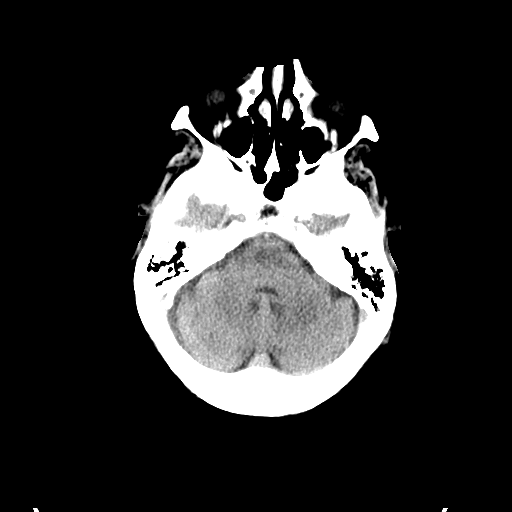
[im 12/35  brain]
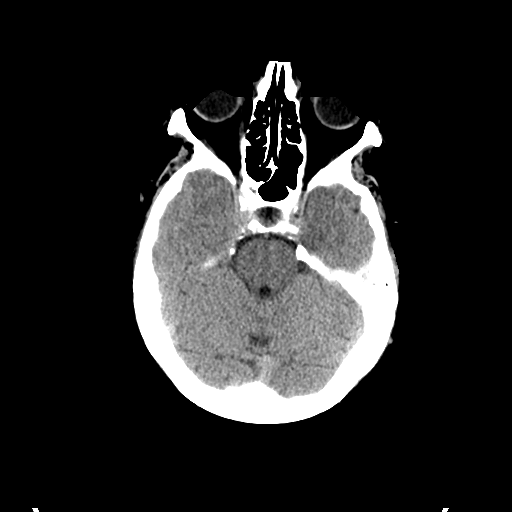
[im 16/35  brain]
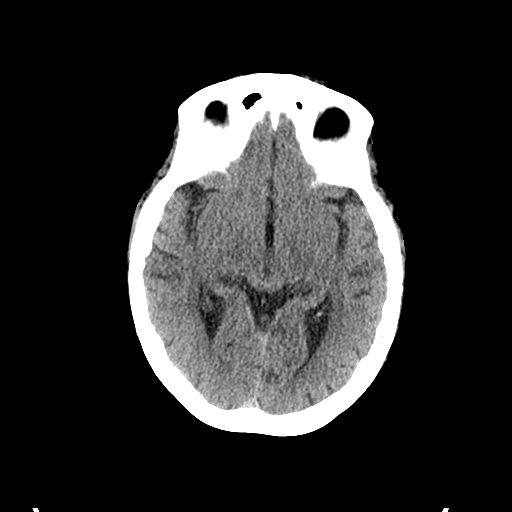
[im 16/35  bone]
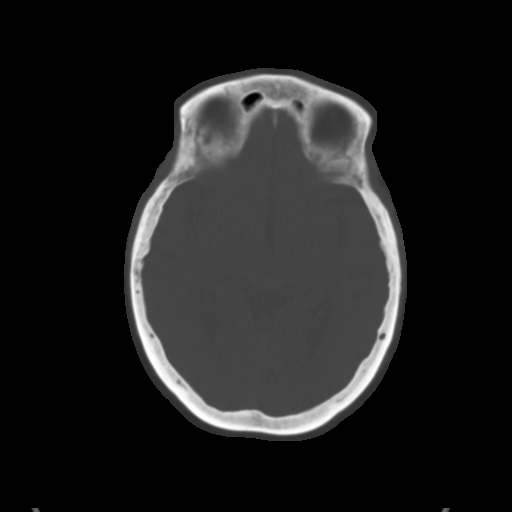
[im 19/35  brain]
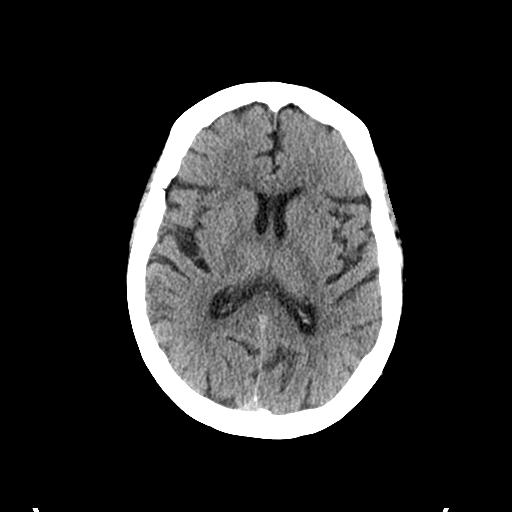
[im 23/35  brain]
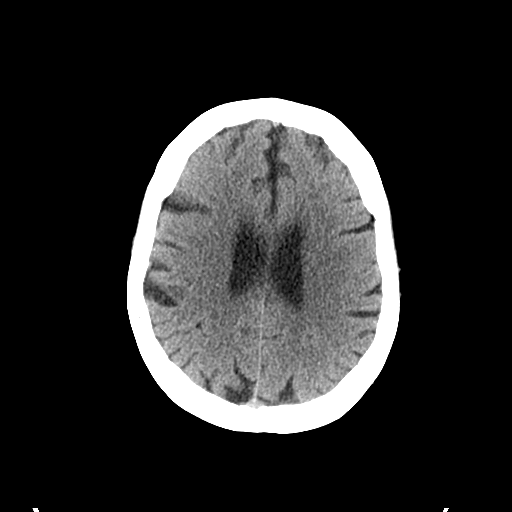
[im 26/35  brain]
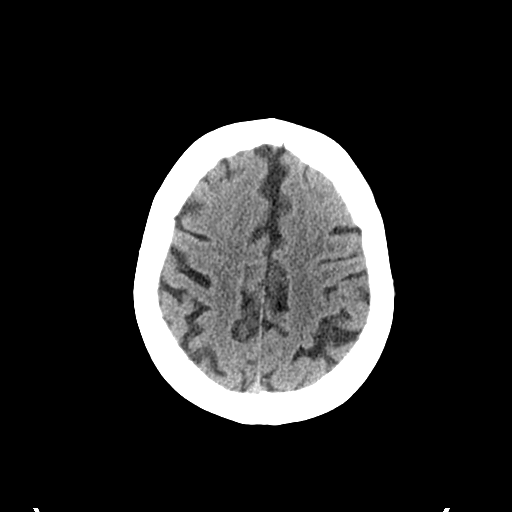
[im 29/35  brain]
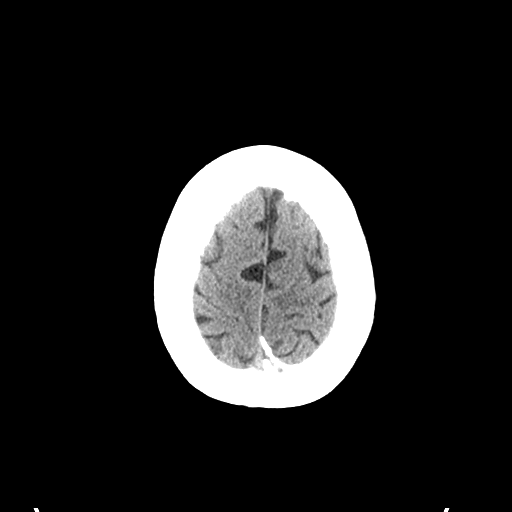
[im 29/35  bone]
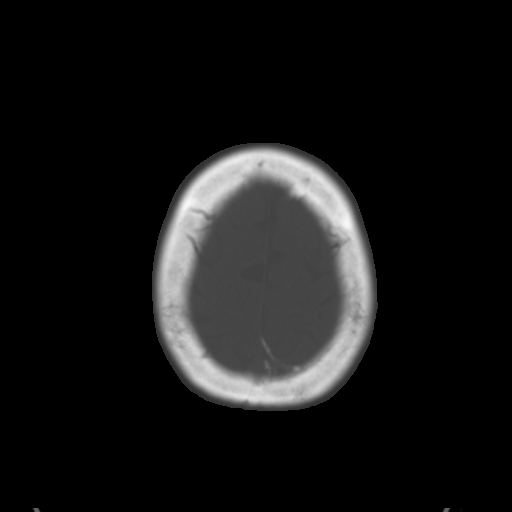
[im 32/35  brain]
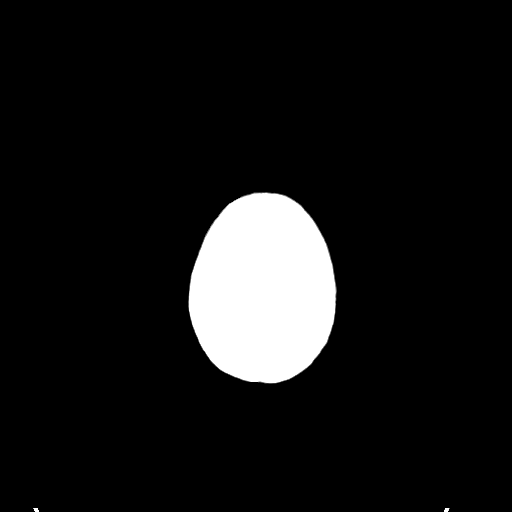

[Series 4: coronal soft · coronal · 0.36mm/px · 3 of 68 slices shown]
[im 23/68  brain]
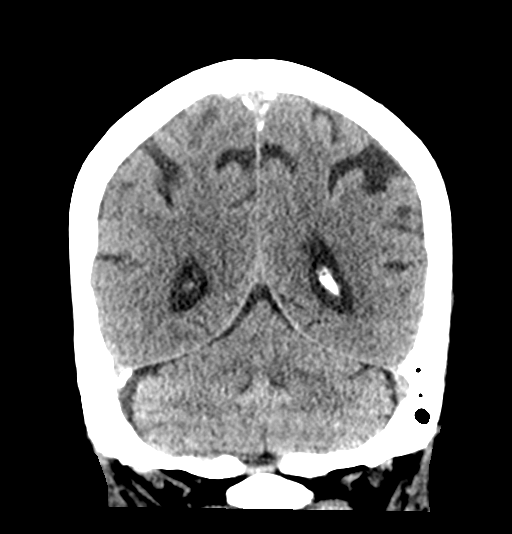
[im 30/68  brain]
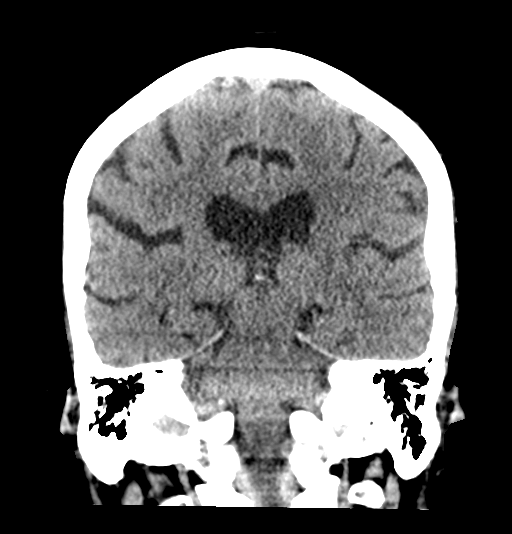
[im 38/68  brain]
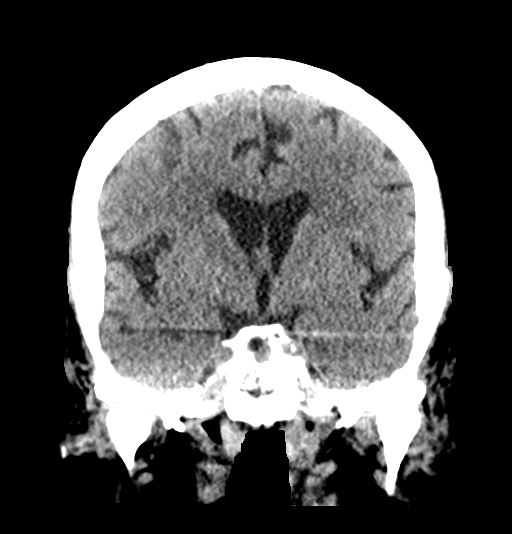

[Series 5: sagittal soft · sagittal · 0.40mm/px · 3 of 59 slices shown]
[im 20/59  brain]
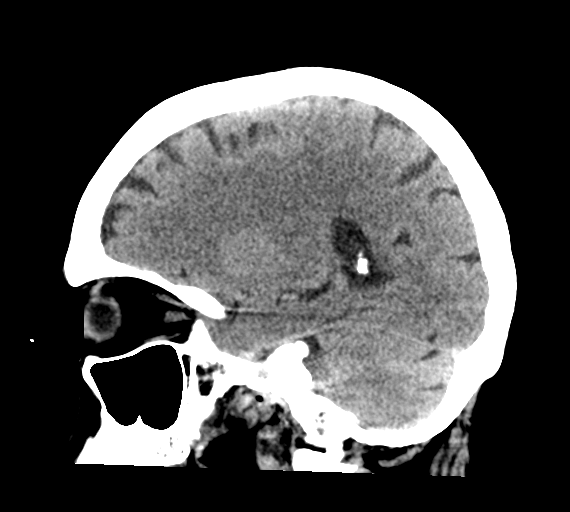
[im 30/59  brain]
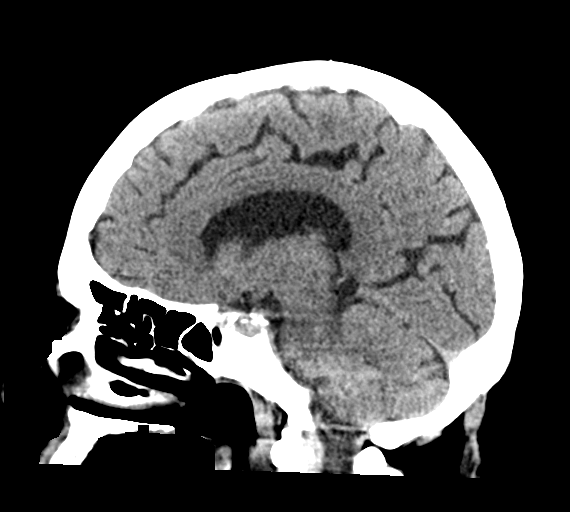
[im 39/59  brain]
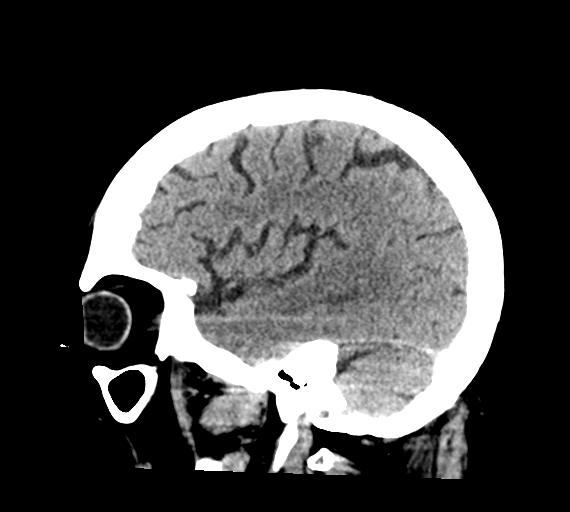

[16 of 47 positions shown; findings below may reference images not displayed]

FINDINGS: Brain: Mild atrophy. No evidence of acute infarction, hemorrhage,
hydrocephalus, extra-axial collection or mass lesion/mass effect.

Vascular: Atherosclerotic and physiologic intracranial
calcifications.

Skull: Normal. Negative for fracture or focal lesion.

Sinuses/Orbits: Hypoplastic frontal sinuses.  Otherwise negative.

Other: None
IMPRESSION: No acute findings

## 2021-10-27 ENCOUNTER — Encounter (HOSPITAL_COMMUNITY): Payer: Medicare Other | Admitting: Physical Therapy

## 2021-10-29 ENCOUNTER — Encounter (HOSPITAL_COMMUNITY): Payer: Medicare Other | Admitting: Physical Therapy

## 2022-01-12 DIAGNOSIS — M5416 Radiculopathy, lumbar region: Secondary | ICD-10-CM | POA: Diagnosis not present

## 2022-02-10 ENCOUNTER — Other Ambulatory Visit: Payer: Self-pay | Admitting: Family Medicine

## 2022-02-18 ENCOUNTER — Ambulatory Visit: Payer: Self-pay | Admitting: Family Medicine

## 2022-02-26 ENCOUNTER — Telehealth: Payer: Self-pay

## 2022-02-26 NOTE — Telephone Encounter (Signed)
Caller name:Deanna Salinas   On DPR? :Yes  Call back number:[phone]  Provider they see: Luking   Reason for call:Pt is calling wants a referral to audiologist in [place]

## 2022-02-28 NOTE — Telephone Encounter (Signed)
May have referral to audiology if there is not audiology then recommend ENT that has audiology.  Please assist patient.  Thank you

## 2022-03-01 ENCOUNTER — Other Ambulatory Visit: Payer: Self-pay | Admitting: *Deleted

## 2022-03-01 DIAGNOSIS — H9193 Unspecified hearing loss, bilateral: Secondary | ICD-10-CM

## 2022-03-01 NOTE — Telephone Encounter (Signed)
Patient's daughter informed of referral.

## 2022-03-01 NOTE — Telephone Encounter (Signed)
Referral placed.

## 2022-03-09 ENCOUNTER — Ambulatory Visit: Payer: Medicare Other | Attending: Audiologist | Admitting: Audiologist

## 2022-03-09 DIAGNOSIS — H903 Sensorineural hearing loss, bilateral: Secondary | ICD-10-CM | POA: Insufficient documentation

## 2022-03-09 NOTE — Procedures (Signed)
Outpatient Audiology and [facility], [place]  [address] [phone]  AUDIOLOGICAL  EVALUATION  NAME: Deanna Salinas     DOB:   Mar 16, 1936      MRN: [id number]                                                                                     DATE: 03/09/2022     REFERENT: Kathyrn Drown, MD STATUS: Outpatient DIAGNOSIS: Severe Sensorineural Hearing Loss Bilateral    History: Lupie was seen for an audiological evaluation due to difficulty hearing even with hearing aids. Nykayla was accompanied today by her husband who provided most case history due to Shauntea not being able to hear speech. Marijah has hearing aids through her daughter. She has been wearing them and they are helping. However Mikele has not had a hearing test in at least five years and has never seen anyone to program the hearing aids. It is unclear if the aids are fit to her prescription at all. My listening check of the aids shows the cannot provide near enough volume to help her, the speaker and tip are not at all appropriate for Ayeza's level of loss. Loney and her husband were counseled extensively on the importance of having hearing aids fit to a prescription by a professional, and why her current aids are not working well for her. Limited other case history obtained.    Evaluation:  Otoscopy showed a slight view of the tympanic membranes with non occluding cerumen, bilaterally Tympanometry results were consistent with normal middle ear function, bilaterally   Audiometric testing was completed using Conventional Audiometry techniques with supraural HF headphones. Test results are consistent with severe to profound sensorineural hearing loss in each ear 250-8kHz. Speech Recognition Thresholds were obtained at 65dB HL in the right ear and at 70dB HL in the left ear with live voice presentation. Word Recognition Testing was completed at 90dB HL, the loudest level she could tolerate. Zanasia  scored 76% in the right ear and 64% in the left ear. This was using live voice with slow presentation.    Results:  The test results were reviewed with Amarachi and her husband. I am not sure who fit the aids she had or where they are from but they are not at all appropriate for her level of loss. They are essentially acting as ear plugs. She needs to use the hearing test from today to have the hearing aids fit to her level of hearing loss. She will need power speakers and a custom ear mold. This was all written down for her husband. They were given the number to a local provider that will refit the hearing aids and have the parts. Vera and her husband reported understanding.   Recommendations: 1.   Hearing aids must be fit to a prescription to work. Lachrisha needs custom molds, power receivers, and an appropriate hearing aid fitting by a professional audiologist. Her husband has the number for a local practice that can provide these services. They were given my card and copies of the audiogram.    56 minutes spent testing and counseling on results.  If you have any questions please feel free to contact me at [phone].  Alfonse Alpers  Audiologist, Au.D., CCC-A 03/09/2022  11:53 AM  Cc: Kathyrn Drown, MD

## 2022-04-09 ENCOUNTER — Other Ambulatory Visit: Payer: Self-pay | Admitting: Family Medicine

## 2022-04-09 DIAGNOSIS — G4700 Insomnia, unspecified: Secondary | ICD-10-CM

## 2022-04-28 ENCOUNTER — Other Ambulatory Visit: Payer: Self-pay | Admitting: Family Medicine

## 2022-04-28 DIAGNOSIS — E119 Type 2 diabetes mellitus without complications: Secondary | ICD-10-CM

## 2022-05-07 DIAGNOSIS — T1490XA Injury, unspecified, initial encounter: Secondary | ICD-10-CM | POA: Diagnosis not present

## 2022-05-07 DIAGNOSIS — R5381 Other malaise: Secondary | ICD-10-CM | POA: Diagnosis not present

## 2022-05-07 DIAGNOSIS — W19XXXA Unspecified fall, initial encounter: Secondary | ICD-10-CM | POA: Diagnosis not present

## 2022-05-11 ENCOUNTER — Telehealth: Payer: Self-pay | Admitting: Family Medicine

## 2022-05-11 NOTE — Telephone Encounter (Signed)
Left message for patient to call back and schedule Medicare Annual Wellness Visit (AWV).  Please offer to do virtually or by telephone.  Last AWV: 05/02/2021  Please schedule at any time with [facility]-Nurse Health Advisor.  30 minute appointment for Virtual or phone  45 minute appointment for  Initial virtual/phone  Any questions, please contact me at [phone]

## 2022-05-18 ENCOUNTER — Ambulatory Visit: Payer: Medicare Other | Admitting: Family Medicine

## 2022-05-20 ENCOUNTER — Ambulatory Visit (INDEPENDENT_AMBULATORY_CARE_PROVIDER_SITE_OTHER): Payer: Medicare Other

## 2022-05-20 VITALS — Ht 66.0 in | Wt 202.0 lb

## 2022-05-20 DIAGNOSIS — G8929 Other chronic pain: Secondary | ICD-10-CM | POA: Insufficient documentation

## 2022-05-20 DIAGNOSIS — G894 Chronic pain syndrome: Secondary | ICD-10-CM | POA: Insufficient documentation

## 2022-05-20 DIAGNOSIS — Z0000 Encounter for general adult medical examination without abnormal findings: Secondary | ICD-10-CM | POA: Diagnosis not present

## 2022-05-20 NOTE — Progress Notes (Signed)
Virtual Visit via Telephone Note  I connected with  MIKEL PYON on 05/20/22 at 10:00 AM EDT by telephone and verified that I am speaking with the correct person using two identifiers.  Location: Patient: .home Provider: [facility] Persons participating in the virtual visit: patient/Nurse Health Advisor   I discussed the limitations, risks, security and privacy concerns of performing an evaluation and management service by telephone and the availability of in person appointments. The patient expressed understanding and agreed to proceed.  Interactive audio and video telecommunications were attempted between this nurse and patient, however failed, due to patient having technical difficulties OR patient did not have access to video capability.  We continued and completed visit with audio only.  Some vital signs may be absent or patient reported.   Dionisio David, LPN  Subjective:   CHANNELL QUATTRONE is a 86 y.o. female who presents for Medicare Annual (Subsequent) preventive examination.  Review of Systems           Objective:    Today's Vitals   05/20/22 0958  PainSc: 5    There is no height or weight on file to calculate BMI.     09/17/2021    9:15 AM 05/02/2021   10:09 AM 12/20/2020    8:33 AM 06/04/2020   11:35 PM 06/04/2020    6:00 PM 06/04/2020   12:01 PM 05/04/2019    2:07 PM  Advanced Directives  Does Patient Have a Medical Advance Directive? Yes Yes No Yes Yes Yes Yes  Type of Corporate treasurer of [place];Living will  [place];Living will Healthcare Power of [place];Living will  Does patient want to make changes to medical advance directive? No - Patient declined    No - Patient declined  No - Patient declined  Copy of [place] in Chart?    No - copy requested No - copy requested  No - copy requested  Would patient like information on creating a medical  advance directive?   No - Patient declined        Current Medications (verified) Outpatient Encounter Medications as of 05/20/2022  Medication Sig   Accu-Chek Softclix Lancets lancets E11.9 USE TO CHECK GLUCOSE ONCE DAILY   albuterol (VENTOLIN HFA) 108 (90 Base) MCG/ACT inhaler Inhale 2 puffs into the lungs every 6 (six) hours as needed for wheezing or shortness of breath.   amLODipine (NORVASC) 2.5 MG tablet Take 1 tablet (2.5 mg total) by mouth daily.   BAYER CONTOUR NEXT TEST test strip USE AS DIRECTED   folic acid (FOLVITE) 1 MG tablet Take 1 tablet (1 mg total) by mouth daily.   glucose blood (ACCU-CHEK GUIDE) test strip E11.9 USE 1 STRIP TO CHECK GLUCOSE ONCE DAILY   glucose blood test strip 1 each by Other route as needed for other. Use as instructed   glucose blood test strip Use to check glucose once daily   guaiFENesin-dextromethorphan (ROBITUSSIN DM) 100-10 MG/5ML syrup Take 5 mLs by mouth every 4 (four) hours as needed for cough. (Patient not taking: Reported on 09/18/2021)   Ibuprofen 200 MG CAPS Take by mouth as needed.   lidocaine (LIDODERM) 5 % Place 1 patch onto the skin daily. Remove & Discard patch within 12 hours or as directed by MD   melatonin 5 MG TABS Take 1 tablet (5 mg total) by mouth at bedtime.   metoprolol tartrate (LOPRESSOR) 25 MG tablet Take  0.5 tablets (12.5 mg total) by mouth 2 (two) times daily.   Multiple Vitamin (MULTI-VITAMIN DAILY PO) Take 1 tablet by mouth daily.   mupirocin ointment (BACTROBAN) 2 % Apply once daily to the ear for 30 days   nortriptyline (PAMELOR) 10 MG capsule TAKE 1 TO 2 CAPSULES BY MOUTH AT BEDTIME AS DIRECTED.   potassium chloride (KLOR-CON) 10 MEQ tablet Take one bid prn when using demadex   temazepam (RESTORIL) 30 MG capsule TAKE 1 CAPSULE BY MOUTH AT BEDTIME AS NEEDED FOR SLEEP.   torsemide (DEMADEX) 20 MG tablet Take one tablet qam prn swelling   traMADol (ULTRAM) 50 MG tablet 1 twice daily as needed severe pain use sparingly  not with temazepam   traMADol (ULTRAM-ER) 100 MG 24 hr tablet TAKE 1 TABLET BY MOUTHTONCE DAILY AT 3PM.   No facility-administered encounter medications on file as of 05/20/2022.    Allergies (verified) Ambien [zolpidem tartrate], Lipitor [atorvastatin], Metformin and related, Pravastatin, and Trazodone and nefazodone   History: Past Medical History:  Diagnosis Date   Essential hypertension    Fatty liver    Hyperlipidemia    Type 2 diabetes mellitus ([place])    Past Surgical History:  Procedure Laterality Date   ABDOMINAL HYSTERECTOMY     APPENDECTOMY     At the time of hysterectomy   CESAREAN SECTION     X5   COLONOSCOPY     COLONOSCOPY N/A 06/11/2013   Colonic diverticulosis, hyperplastic polyps. no further screening colonoscopies recommended   ESOPHAGOGASTRODUODENOSCOPY N/A 02/20/2014   Procedure: ESOPHAGOGASTRODUODENOSCOPY (EGD);  Surgeon: Daneil Dolin, MD;  Location: [facility];  Service: Endoscopy;  Laterality: N/A;  11:00   Family History  Problem Relation Age of Onset   Cancer Mother    Heart Problems Father    Congestive Heart Failure Father    Cancer Daughter        breast    Hypertension Other    Hypertension Other    Colon cancer Neg Hx    Social History   Socioeconomic History   Marital status: Married    Spouse name: Not on file   Number of children: 5   Years of education: Not on file   Highest education level: Not on file  Occupational History    Employer: RETIRED  Tobacco Use   Smoking status: Never   Smokeless tobacco: Never  Vaping Use   Vaping Use: Never used  Substance and Sexual Activity   Alcohol use: Yes    Alcohol/week: 3.0 standard drinks of alcohol    Types: 3 Glasses of wine per week    Comment: occasional   Drug use: No   Sexual activity: Not on file  Other Topics Concern   Not on file  Social History Narrative   Not on file   Social Determinants of Health   Financial Resource Strain: Low Risk  (05/02/2021)    Overall Financial Resource Strain (CARDIA)    Difficulty of Paying Living Expenses: Not hard at all  Food Insecurity: No Food Insecurity (05/02/2021)   Hunger Vital Sign    Worried About Running Out of Food in the Last Year: Never true    Ran Out of Food in the Last Year: Never true  Transportation Needs: No Transportation Needs (05/02/2021)   PRAPARE - Hydrologist (Medical): No    Lack of Transportation (Non-Medical): No  Physical Activity: Inactive (05/02/2021)   Exercise Vital Sign    Days of  Exercise per Week: 0 days    Minutes of Exercise per Session: 0 min  Stress: No Stress Concern Present (05/02/2021)   Benjamin Perez    Feeling of Stress : Only a little  Social Connections: Moderately Isolated (05/02/2021)   Social Connection and Isolation Panel [NHANES]    Frequency of Communication with Friends and Family: More than three times a week    Frequency of Social Gatherings with Friends and Family: More than three times a week    Attends Religious Services: Never    Marine scientist or Organizations: No    Attends Music therapist: Never    Marital Status: Married    Tobacco Counseling Counseling given: Not Answered   Clinical Intake:  Pre-visit preparation completed: Yes  Pain : 0-10 Pain Score: 5  Pain Type: Acute pain Pain Location: Back Effect of Pain on Daily Activities: hurts all over     Nutritional Risks: None Diabetes: Yes CBG done?: No Did pt. bring in CBG monitor from home?: No  How often do you need to have someone help you when you read instructions, pamphlets, or other written materials from your doctor or pharmacy?: 1 - Never  Diabetic?yes Nutrition Risk Assessment:  Has the patient had any N/V/D within the last 2 months?  No  Does the patient have any non-healing wounds?  No  Has the patient had any unintentional weight loss or weight  gain?  No   Diabetes:  Is the patient diabetic?  Yes  If diabetic, was a CBG obtained today?  No  Did the patient bring in their glucometer from home?  No  How often do you monitor your CBG's? never.   Financial Strains and Diabetes Management:  Are you having any financial strains with the device, your supplies or your medication? No .  Does the patient want to be seen by Chronic Care Management for management of their diabetes?  No  Would the patient like to be referred to a Nutritionist or for Diabetic Management?  No   Diabetic Exams:  Diabetic Eye Exam: Completed 04/09/18. Overdue for diabetic eye exam. Pt has been advised about the importance in completing this exam.   Diabetic Foot Exam: Completed 03/09/21. Pt has been advised about the importance in completing this exam.   Interpreter Needed?: No  Information entered by :: Kirke Shaggy, LPN   Activities of Daily Living     No data to display          Patient Care Team: Kathyrn Drown, MD as PCP - General (Family Medicine) Satira Sark, MD as PCP - Cardiology (Cardiology) Gala Romney Cristopher Estimable, MD as Consulting Physician (Gastroenterology)  Indicate any recent Medical Services you may have received from other than [facility] providers in the past year (date may be approximate).     Assessment:   This is a routine wellness examination for Donelda.  Hearing/Vision screen No results found.  Dietary issues and exercise activities discussed:     Goals Addressed   None    Depression Screen    05/02/2021   10:10 AM 05/02/2021   10:05 AM 03/09/2021    2:31 PM 03/07/2018    9:28 AM 03/02/2016   10:25 AM 11/19/2015   12:56 PM 09/12/2014   12:20 PM  PHQ 2/9 Scores  PHQ - 2 Score 1 1 0 0 0 0 0  PHQ- 9 Score 4  Fall Risk    10/19/2021    8:30 AM 09/18/2021    9:57 AM 05/02/2021   10:09 AM 03/09/2021    2:30 PM 02/02/2021   11:09 AM  Fall Risk   Falls in the past year? 0 0 1 0 1  Number falls in past yr:  0 1 0  0  Injury with Fall?  0 1 0 1  Risk for fall due to : Impaired balance/gait;Impaired mobility No Fall Risks History of fall(s);Impaired vision History of fall(s) History of fall(s)  Follow up Falls evaluation completed Falls evaluation completed Falls evaluation completed  Falls evaluation completed    FALL RISK PREVENTION PERTAINING TO THE HOME:  Any stairs in or around the home? Yes  If so, are there any without handrails? No  Home free of loose throw rugs in walkways, pet beds, electrical cords, etc? Yes  Adequate lighting in your home to reduce risk of falls? Yes   ASSISTIVE DEVICES UTILIZED TO PREVENT FALLS:  Life alert? Yes  Use of a cane, walker or w/c? Yes  Grab bars in the bathroom? Yes  Shower chair or bench in shower? Yes  Elevated toilet seat or a handicapped toilet? Yes    Cognitive Function:declined PT is A & O x 3        05/02/2021   10:13 AM  6CIT Screen  What Year? 0 points  What month? 0 points  What time? 0 points  Count back from 20 0 points  Months in reverse 0 points  Repeat phrase 10 points  Total Score 10 points    Immunizations Immunization History  Administered Date(s) Administered   Fluad Quad(high Dose 65+) 06/05/2020   Influenza Split 05/31/2013   Influenza Whole 06/02/2006, 06/05/2007, 05/23/2008   Influenza,inj,Quad PF,6+ Mos 06/16/2015, 04/20/2017, 06/01/2018, 04/24/2019   Influenza-Unspecified 05/08/2012, 05/23/2014, 05/19/2016   PFIZER(Purple Top)SARS-COV-2 Vaccination 08/21/2019, 09/08/2019, 05/22/2020   Pneumococcal Conjugate-13 09/12/2014   Pneumococcal Polysaccharide-23 06/08/2012   Td 02/17/2009   Zoster Recombinat (Shingrix) 09/13/2017, 05/11/2018   Zoster, Live 06/16/2003    TDAP status: Due, Education has been provided regarding the importance of this vaccine. Advised may receive this vaccine at local pharmacy or Health Dept. Aware to provide a copy of the vaccination record if obtained from local pharmacy or Health Dept.  Verbalized acceptance and understanding.  Flu Vaccine status: Up to date  Pneumococcal vaccine status: Up to date  Covid-19 vaccine status: Completed vaccines  Qualifies for Shingles Vaccine? Yes   Zostavax completed Yes   Shingrix Completed?: Yes  Screening Tests Health Maintenance  Topic Date Due   TETANUS/TDAP  02/18/2019   OPHTHALMOLOGY EXAM  04/10/2019   COVID-19 Vaccine (4 - Pfizer risk series) 07/17/2020   INFLUENZA VACCINE  03/09/2022   FOOT EXAM  03/09/2022   HEMOGLOBIN A1C  03/15/2022   Diabetic kidney evaluation - Urine ACR  09/15/2022   Diabetic kidney evaluation - GFR measurement  10/15/2022   Pneumonia Vaccine 48+ Years old  Completed   DEXA SCAN  Completed   Zoster Vaccines- Shingrix  Completed   HPV VACCINES  Aged Out    Health Maintenance  Health Maintenance Due  Topic Date Due   TETANUS/TDAP  02/18/2019   OPHTHALMOLOGY EXAM  04/10/2019   COVID-19 Vaccine (4 - Pfizer risk series) 07/17/2020   INFLUENZA VACCINE  03/09/2022   FOOT EXAM  03/09/2022   HEMOGLOBIN A1C  03/15/2022    Colorectal cancer screening: No longer required.   Mammogram status: No longer  required due to age.  Bone Density status: Completed 08/29/17. Results reflect: Bone density results: NORMAL. Repeat every 5 years.  Lung Cancer Screening: (Low Dose CT Chest recommended if Age 53-80 years, 30 pack-year currently smoking OR have quit w/in 15years.) does not qualify.   Additional Screening:  Hepatitis C Screening: does not qualify; Completed no  Vision Screening: Recommended annual ophthalmology exams for early detection of glaucoma and other disorders of the eye. Is the patient up to date with their annual eye exam?  Yes  Who is the provider or what is the name of the office in which the patient attends annual eye exams? [place] [facility]  If pt is not established with a provider, would they like to be referred to a provider to establish care? No .   Dental Screening: Recommended  annual dental exams for proper oral hygiene  Community Resource Referral / Chronic Care Management: CRR required this visit?  No   CCM required this visit?  No      Plan:     I have personally reviewed and noted the following in the patient's chart:   Medical and social history Use of alcohol, tobacco or illicit drugs  Current medications and supplements including opioid prescriptions. Patient is not currently taking opioid prescriptions. Functional ability and status Nutritional status Physical activity Advanced directives List of other physicians Hospitalizations, surgeries, and ER visits in previous 12 months Vitals Screenings to include cognitive, depression, and falls Referrals and appointments  In addition, I have reviewed and discussed with patient certain preventive protocols, quality metrics, and best practice recommendations. A written personalized care plan for preventive services as well as general preventive health recommendations were provided to patient.     Dionisio David, LPN   [id number]   Nurse Notes: none

## 2022-05-20 NOTE — Patient Instructions (Signed)
Deanna Salinas , Thank you for taking time to come for your Medicare Wellness Visit. I appreciate your ongoing commitment to your health goals. Please review the following plan we discussed and let me know if I can assist you in the future.   Screening recommendations/referrals: Colonoscopy: aged out Mammogram: aged out Bone Density: 08/29/17 Recommended yearly ophthalmology/optometry visit for glaucoma screening and checkup Recommended yearly dental visit for hygiene and checkup  Vaccinations: Influenza vaccine: 05/22/21 Pneumococcal vaccine: 09/12/14 Tdap vaccine: 02/17/09, due if have injury  Shingles vaccine: Zostavax 06/16/03   Shingrix 09/13/17, 05/11/18   Covid-19:08/21/19, 09/08/19, 05/22/20  Advanced directives: no  Conditions/risks identified: none  Next appointment: Follow up in one year for your annual wellness visit   Preventive Care 18 Years and Older, Female Preventive care refers to lifestyle choices and visits with your health care provider that can promote health and wellness. What does preventive care include? A yearly physical exam. This is also called an annual well check. Dental exams once or twice a year. Routine eye exams. Ask your health care provider how often you should have your eyes checked. Personal lifestyle choices, including: Daily care of your teeth and gums. Regular physical activity. Eating a healthy diet. Avoiding tobacco and drug use. Limiting alcohol use. Practicing safe sex. Taking low-dose aspirin every day. Taking vitamin and mineral supplements as recommended by your health care provider. What happens during an annual well check? The services and screenings done by your health care provider during your annual well check will depend on your age, overall health, lifestyle risk factors, and family history of disease. Counseling  Your health care provider may ask you questions about your: Alcohol use. Tobacco use. Drug use. Emotional  well-being. Home and relationship well-being. Sexual activity. Eating habits. History of falls. Memory and ability to understand (cognition). Work and work Statistician. Reproductive health. Screening  You may have the following tests or measurements: Height, weight, and BMI. Blood pressure. Lipid and cholesterol levels. These may be checked every 5 years, or more frequently if you are over 32 years old. Skin check. Lung cancer screening. You may have this screening every year starting at age 19 if you have a 30-pack-year history of smoking and currently smoke or have quit within the past 15 years. Fecal occult blood test (FOBT) of the stool. You may have this test every year starting at age 34. Flexible sigmoidoscopy or colonoscopy. You may have a sigmoidoscopy every 5 years or a colonoscopy every 10 years starting at age 55. Hepatitis C blood test. Hepatitis B blood test. Sexually transmitted disease (STD) testing. Diabetes screening. This is done by checking your blood sugar (glucose) after you have not eaten for a while (fasting). You may have this done every 1-3 years. Bone density scan. This is done to screen for osteoporosis. You may have this done starting at age 2. Mammogram. This may be done every 1-2 years. Talk to your health care provider about how often you should have regular mammograms. Talk with your health care provider about your test results, treatment options, and if necessary, the need for more tests. Vaccines  Your health care provider may recommend certain vaccines, such as: Influenza vaccine. This is recommended every year. Tetanus, diphtheria, and acellular pertussis (Tdap, Td) vaccine. You may need a Td booster every 10 years. Zoster vaccine. You may need this after age 73. Pneumococcal 13-valent conjugate (PCV13) vaccine. One dose is recommended after age 32. Pneumococcal polysaccharide (PPSV23) vaccine. One dose is recommended after  age 2. Talk to your  health care provider about which screenings and vaccines you need and how often you need them. This information is not intended to replace advice given to you by your health care provider. Make sure you discuss any questions you have with your health care provider. Document Released: 08/22/2015 Document Revised: 04/14/2016 Document Reviewed: 05/27/2015 Elsevier Interactive Patient Education  2017 [place] Prevention in the Home Falls can cause injuries. They can happen to people of all ages. There are many things you can do to make your home safe and to help prevent falls. What can I do on the outside of my home? Regularly fix the edges of walkways and driveways and fix any cracks. Remove anything that might make you trip as you walk through a door, such as a raised step or threshold. Trim any bushes or trees on the path to your home. Use bright outdoor lighting. Clear any walking paths of anything that might make someone trip, such as rocks or tools. Regularly check to see if handrails are loose or broken. Make sure that both sides of any steps have handrails. Any raised decks and porches should have guardrails on the edges. Have any leaves, snow, or ice cleared regularly. Use sand or salt on walking paths during winter. Clean up any spills in your garage right away. This includes oil or grease spills. What can I do in the bathroom? Use night lights. Install grab bars by the toilet and in the tub and shower. Do not use towel bars as grab bars. Use non-skid mats or decals in the tub or shower. If you need to sit down in the shower, use a plastic, non-slip stool. Keep the floor dry. Clean up any water that spills on the floor as soon as it happens. Remove soap buildup in the tub or shower regularly. Attach bath mats securely with double-sided non-slip rug tape. Do not have throw rugs and other things on the floor that can make you trip. What can I do in the bedroom? Use night  lights. Make sure that you have a light by your bed that is easy to reach. Do not use any sheets or blankets that are too big for your bed. They should not hang down onto the floor. Have a firm chair that has side arms. You can use this for support while you get dressed. Do not have throw rugs and other things on the floor that can make you trip. What can I do in the kitchen? Clean up any spills right away. Avoid walking on wet floors. Keep items that you use a lot in easy-to-reach places. If you need to reach something above you, use a strong step stool that has a grab bar. Keep electrical cords out of the way. Do not use floor polish or wax that makes floors slippery. If you must use wax, use non-skid floor wax. Do not have throw rugs and other things on the floor that can make you trip. What can I do with my stairs? Do not leave any items on the stairs. Make sure that there are handrails on both sides of the stairs and use them. Fix handrails that are broken or loose. Make sure that handrails are as long as the stairways. Check any carpeting to make sure that it is firmly attached to the stairs. Fix any carpet that is loose or worn. Avoid having throw rugs at the top or bottom of the stairs. If you do  have throw rugs, attach them to the floor with carpet tape. Make sure that you have a light switch at the top of the stairs and the bottom of the stairs. If you do not have them, ask someone to add them for you. What else can I do to help prevent falls? Wear shoes that: Do not have high heels. Have rubber bottoms. Are comfortable and fit you well. Are closed at the toe. Do not wear sandals. If you use a stepladder: Make sure that it is fully opened. Do not climb a closed stepladder. Make sure that both sides of the stepladder are locked into place. Ask someone to hold it for you, if possible. Clearly mark and make sure that you can see: Any grab bars or handrails. First and last  steps. Where the edge of each step is. Use tools that help you move around (mobility aids) if they are needed. These include: Canes. Walkers. Scooters. Crutches. Turn on the lights when you go into a dark area. Replace any light bulbs as soon as they burn out. Set up your furniture so you have a clear path. Avoid moving your furniture around. If any of your floors are uneven, fix them. If there are any pets around you, be aware of where they are. Review your medicines with your doctor. Some medicines can make you feel dizzy. This can increase your chance of falling. Ask your doctor what other things that you can do to help prevent falls. This information is not intended to replace advice given to you by your health care provider. Make sure you discuss any questions you have with your health care provider. Document Released: 05/22/2009 Document Revised: 01/01/2016 Document Reviewed: 08/30/2014 Elsevier Interactive Patient Education  2017 [employer].

## 2022-05-24 ENCOUNTER — Telehealth: Payer: Self-pay | Admitting: Family Medicine

## 2022-05-24 ENCOUNTER — Other Ambulatory Visit: Payer: Self-pay | Admitting: Family Medicine

## 2022-05-24 NOTE — Telephone Encounter (Signed)
Phone call Correction Clarified with family how she is taking tramadol currently?  What milligram dosage?  How often per day?  Then possibly we can make adjustments  When she does come for her office visit she should bring all of her pill bottles in a bag for [place] to review

## 2022-05-24 NOTE — Telephone Encounter (Signed)
This is a very difficult situation I am sympathetic to the fact patient is having pain but at the same time we have to be careful with medications because medicines can have interactions In the meantime I will increase tramadol to be able to be used 3 times per day I recommend a follow-up office visit with myself somewhere within the next 3 weeks thank you

## 2022-05-24 NOTE — Telephone Encounter (Signed)
Pt husband contacted per phone message from [facility]  Pt is in constant pain-knee and ankles. Husband states that pt says "every bone in her body hurts". Husband states pt is totally lucid. Cries a lot. Husband describes pt as "a wonderful person trapped in a bad body. Pt tried to get out of bed but knees buckled and was unable to make it out of bed. Pt husband would like Tramadol increased. Husband is trying to get pt to call Dr.Ikeman. Pt husband also states he believes the pt has built a tolerance to medications. Please advise. Thank you

## 2022-05-26 ENCOUNTER — Telehealth: Payer: Self-pay

## 2022-05-26 NOTE — Telephone Encounter (Signed)
Left message to return call 

## 2022-05-26 NOTE — Telephone Encounter (Signed)
I would not exceed 300 mg/day As for the pain doctor ( Dr Richard Miu) when will the patient be seeing him again?

## 2022-05-26 NOTE — Telephone Encounter (Signed)
See new message concerning Tramadol dosing (05/26/22)

## 2022-05-26 NOTE — Telephone Encounter (Signed)
Patient would like to make dr aware that patient is taking tramadol 100 mg once daily rx by Dr Richard Miu, pain doctor And is taking tramadol 50 mg 1 3 times per day Q 6 hrs , they would like to know if this can be increased in strength , please advise.

## 2022-05-26 NOTE — Telephone Encounter (Signed)
MyChart message sent to patient.

## 2022-05-27 NOTE — Telephone Encounter (Signed)
Sounds good

## 2022-05-27 NOTE — Telephone Encounter (Signed)
Pt husband returned call. Husband states that [facility] increased Tramadol to 300 mg/day. Taking 50 mg in 6 hr intervals and will take 100 mg at 3:00 pm. Husband states that since she has 3 more refills on [facility]azi is guessing that Dr.Ikeman will see her in 3 months. Pt has appt for 06/24/22 and husband would like to keep that appt.

## 2022-06-01 ENCOUNTER — Other Ambulatory Visit: Payer: Self-pay | Admitting: Family Medicine

## 2022-06-01 DIAGNOSIS — G4700 Insomnia, unspecified: Secondary | ICD-10-CM

## 2022-06-24 ENCOUNTER — Ambulatory Visit (INDEPENDENT_AMBULATORY_CARE_PROVIDER_SITE_OTHER): Payer: Medicare Other | Admitting: Family Medicine

## 2022-06-24 ENCOUNTER — Encounter: Payer: Self-pay | Admitting: Family Medicine

## 2022-06-24 VITALS — BP 142/86 | HR 92 | Temp 97.3°F | Ht 66.0 in

## 2022-06-24 DIAGNOSIS — M791 Myalgia, unspecified site: Secondary | ICD-10-CM | POA: Diagnosis not present

## 2022-06-24 DIAGNOSIS — H9193 Unspecified hearing loss, bilateral: Secondary | ICD-10-CM

## 2022-06-24 DIAGNOSIS — E038 Other specified hypothyroidism: Secondary | ICD-10-CM

## 2022-06-24 DIAGNOSIS — D72828 Other elevated white blood cell count: Secondary | ICD-10-CM | POA: Diagnosis not present

## 2022-06-24 DIAGNOSIS — E785 Hyperlipidemia, unspecified: Secondary | ICD-10-CM | POA: Diagnosis not present

## 2022-06-24 DIAGNOSIS — E1169 Type 2 diabetes mellitus with other specified complication: Secondary | ICD-10-CM | POA: Diagnosis not present

## 2022-06-24 DIAGNOSIS — G4700 Insomnia, unspecified: Secondary | ICD-10-CM

## 2022-06-24 DIAGNOSIS — E119 Type 2 diabetes mellitus without complications: Secondary | ICD-10-CM | POA: Diagnosis not present

## 2022-06-24 DIAGNOSIS — T466X5A Adverse effect of antihyperlipidemic and antiarteriosclerotic drugs, initial encounter: Secondary | ICD-10-CM | POA: Diagnosis not present

## 2022-06-24 DIAGNOSIS — F5104 Psychophysiologic insomnia: Secondary | ICD-10-CM | POA: Diagnosis not present

## 2022-06-24 DIAGNOSIS — R296 Repeated falls: Secondary | ICD-10-CM

## 2022-06-24 DIAGNOSIS — M48062 Spinal stenosis, lumbar region with neurogenic claudication: Secondary | ICD-10-CM

## 2022-06-24 DIAGNOSIS — IMO0001 Reserved for inherently not codable concepts without codable children: Secondary | ICD-10-CM | POA: Insufficient documentation

## 2022-06-24 DIAGNOSIS — G894 Chronic pain syndrome: Secondary | ICD-10-CM

## 2022-06-24 DIAGNOSIS — Z23 Encounter for immunization: Secondary | ICD-10-CM | POA: Diagnosis not present

## 2022-06-24 DIAGNOSIS — H9190 Unspecified hearing loss, unspecified ear: Secondary | ICD-10-CM | POA: Insufficient documentation

## 2022-06-24 DIAGNOSIS — M2550 Pain in unspecified joint: Secondary | ICD-10-CM

## 2022-06-24 NOTE — Progress Notes (Signed)
Subjective:    Patient ID: Deanna Salinas, female    DOB: 01-08-1936, 86 y.o.   MRN: [id number]  HPI 3 week follow up  Arthritis - takes tramadol '250mg'$  to 300 mg daily prn pain Immunization due - Plan: Flu Vaccine QUAD High Dose(Fluad)  Insomnia, unspecified type - Plan: Flu Vaccine QUAD High Dose(Fluad), CBC with Differential, CK, Hemoglobin O3Z, Basic Metabolic Panel, Sedimentation Rate  Subclinical hypothyroidism - Plan: Flu Vaccine QUAD High Dose(Fluad), CBC with Differential, CK, Hemoglobin C5Y, Basic Metabolic Panel, Sedimentation Rate  Myalgia due to statin - Plan: Flu Vaccine QUAD High Dose(Fluad), CBC with Differential, CK, Hemoglobin I5O, Basic Metabolic Panel, Sedimentation Rate  Frequent falls  Type 2 diabetes mellitus without complication, without long-term current use of insulin (HCC) - Plan: Flu Vaccine QUAD High Dose(Fluad), CBC with Differential, CK, Hemoglobin Y7X, Basic Metabolic Panel, Sedimentation Rate  Hyperlipidemia associated with type 2 diabetes mellitus (HCC) - Plan: Flu Vaccine QUAD High Dose(Fluad), CBC with Differential, CK, Hemoglobin A1O, Basic Metabolic Panel, Sedimentation Rate  Spinal stenosis of lumbar region with neurogenic claudication - Plan: Flu Vaccine QUAD High Dose(Fluad), CBC with Differential, CK, Hemoglobin I7O, Basic Metabolic Panel, Sedimentation Rate  Other elevated white blood cell (WBC) count - Plan: Flu Vaccine QUAD High Dose(Fluad), CBC with Differential, CK, Hemoglobin M7E, Basic Metabolic Panel, Sedimentation Rate  Arthralgia, unspecified joint - Plan: Flu Vaccine QUAD High Dose(Fluad), CBC with Differential, CK, Hemoglobin H2C, Basic Metabolic Panel, Sedimentation Rate  Bilateral hearing loss, unspecified hearing loss type - Plan: Flu Vaccine QUAD High Dose(Fluad), CBC with Differential, CK, Hemoglobin N4B, Basic Metabolic Panel, Sedimentation Rate  Review of Systems     Objective:   Physical Exam  General-in no acute  distress Eyes-no discharge Lungs-respiratory rate normal, CTA CV-no murmurs,RRR Extremities skin warm dry no edema Neuro grossly normal Behavior normal, alert       Assessment & Plan:  1. Immunization due Flu shot - Flu Vaccine QUAD High Dose(Fluad)  2. Insomnia, unspecified type Continue temazepam.  This is a less than ideal situation but without the medicine she does not sleep at all in the past she has tried antianxiety medicine she has tried antidepressants did not tolerate them she also tried even drinking alcohol to help her sleep which was not something we would want her to do At 1 time and I even discussed her case with a Nurse, [facility] who stated and sometimes in difficult situations sleep medicines are necessary - Flu Vaccine QUAD High Dose(Fluad) - CBC with Differential - CK - Hemoglobin S9G - Basic Metabolic Panel - Sedimentation Rate  3. Subclinical hypothyroidism Hold off on checking TSH currently we can do these labs on next visit - Flu Vaccine QUAD High Dose(Fluad) - CBC with Differential - CK - Hemoglobin G8Z - Basic Metabolic Panel - Sedimentation Rate  4. Myalgia due to statin Patient cannot tolerate statins - Flu Vaccine QUAD High Dose(Fluad) - CBC with Differential - CK - Hemoglobin M6Q - Basic Metabolic Panel - Sedimentation Rate  5. Type 2 diabetes mellitus without complication, without long-term current use of insulin (HCC) Check A1c watch starches in diet - Flu Vaccine QUAD High Dose(Fluad) - CBC with Differential - CK - Hemoglobin H4T - Basic Metabolic Panel - Sedimentation Rate  6. Hyperlipidemia associated with type 2 diabetes mellitus ([place]) Does not tolerate statins healthy diet recommended - Flu Vaccine QUAD High Dose(Fluad) - CBC with Differential - CK - Hemoglobin M5Y - Basic Metabolic Panel - Sedimentation Rate  7. Spinal stenosis of lumbar region with neurogenic claudication Chronic pain in her back as well as her joints  specialist is treating her with tramadol currently - Flu Vaccine QUAD High Dose(Fluad) - CBC with Differential - CK - Hemoglobin L3Y - Basic Metabolic Panel - Sedimentation Rate  8. Other elevated white blood cell (WBC) count History of leukocytosis check white count - Flu Vaccine QUAD High Dose(Fluad) - CBC with Differential - CK - Hemoglobin B0F - Basic Metabolic Panel - Sedimentation Rate  9. Arthralgia, unspecified joint History of arthralgias checked sedimentation rate - Flu Vaccine QUAD High Dose(Fluad) - CBC with Differential - CK - Hemoglobin B5Z - Basic Metabolic Panel - Sedimentation Rate  10. Bilateral hearing loss, unspecified hearing loss type Significant hearing loss - Flu Vaccine QUAD High Dose(Fluad) - CBC with Differential - CK - Hemoglobin W2H - Basic Metabolic Panel - Sedimentation Rate  Follow-up in 3 to 4 months  Patient has motility issues that are more than likely related into her chronic arthritis osteoarthritis as well as chronic pain plus also weakness.  She also has frequent falls.  We did discuss safe ways of moving around plus also discussed the importance of using a walker she has a difficult time with mobility at some point she may be going toward a scooter She would benefit from having a wheelchair to transport long distances when she goes places  She will follow-up in March await her lab work

## 2022-06-25 LAB — CBC WITH DIFFERENTIAL/PLATELET
Basophils Absolute: 0.1 10*3/uL (ref 0.0–0.2)
Basos: 1 %
EOS (ABSOLUTE): 0.2 10*3/uL (ref 0.0–0.4)
Eos: 2 %
Hematocrit: 42.7 % (ref 34.0–46.6)
Hemoglobin: 14.1 g/dL (ref 11.1–15.9)
Immature Grans (Abs): 0 10*3/uL (ref 0.0–0.1)
Immature Granulocytes: 0 %
Lymphocytes Absolute: 2.2 10*3/uL (ref 0.7–3.1)
Lymphs: 21 %
MCH: 31.5 pg (ref 26.6–33.0)
MCHC: 33 g/dL (ref 31.5–35.7)
MCV: 96 fL (ref 79–97)
Monocytes Absolute: 0.7 10*3/uL (ref 0.1–0.9)
Monocytes: 7 %
Neutrophils Absolute: 7.5 10*3/uL — ABNORMAL HIGH (ref 1.4–7.0)
Neutrophils: 69 %
Platelets: 257 10*3/uL (ref 150–450)
RBC: 4.47 x10E6/uL (ref 3.77–5.28)
RDW: 12 % (ref 11.7–15.4)
WBC: 10.8 10*3/uL (ref 3.4–10.8)

## 2022-06-25 LAB — SEDIMENTATION RATE: Sed Rate: 8 mm/hr (ref 0–40)

## 2022-06-25 LAB — BASIC METABOLIC PANEL
BUN/Creatinine Ratio: 30 — ABNORMAL HIGH (ref 12–28)
BUN: 24 mg/dL (ref 8–27)
CO2: 26 mmol/L (ref 20–29)
Calcium: 9.8 mg/dL (ref 8.7–10.3)
Chloride: 99 mmol/L (ref 96–106)
Creatinine, Ser: 0.81 mg/dL (ref 0.57–1.00)
Glucose: 122 mg/dL — ABNORMAL HIGH (ref 70–99)
Potassium: 5.2 mmol/L (ref 3.5–5.2)
Sodium: 141 mmol/L (ref 134–144)
eGFR: 72 mL/min/{1.73_m2} (ref >59)

## 2022-06-25 LAB — HEMOGLOBIN A1C
Est. average glucose Bld gHb Est-mCnc: 128 mg/dL
Hgb A1c MFr Bld: 6.1 % — ABNORMAL HIGH (ref 4.8–5.6)

## 2022-06-25 LAB — CK: Total CK: 31 U/L (ref 26–161)

## 2022-06-26 ENCOUNTER — Encounter: Payer: Self-pay | Admitting: Family Medicine

## 2022-06-28 ENCOUNTER — Other Ambulatory Visit: Payer: Self-pay | Admitting: Family Medicine

## 2022-06-28 ENCOUNTER — Telehealth: Payer: Self-pay | Admitting: Family Medicine

## 2022-06-28 DIAGNOSIS — G4700 Insomnia, unspecified: Secondary | ICD-10-CM

## 2022-06-28 DIAGNOSIS — R300 Dysuria: Secondary | ICD-10-CM | POA: Diagnosis not present

## 2022-06-28 LAB — POCT URINALYSIS DIP (CLINITEK)
Spec Grav, UA: 1.01 (ref 1.010–1.025)
pH, UA: 6 (ref 5.0–8.0)

## 2022-06-28 NOTE — Telephone Encounter (Signed)
Urine brought in by husband. Dipped and spun and sent for culture. Please advise. Thank you

## 2022-06-30 NOTE — Telephone Encounter (Signed)
Await culture results thank you

## 2022-07-01 LAB — URINE CULTURE

## 2022-07-28 ENCOUNTER — Telehealth: Payer: Self-pay | Admitting: Family Medicine

## 2022-07-28 ENCOUNTER — Encounter: Payer: Self-pay | Admitting: Family Medicine

## 2022-07-28 NOTE — Telephone Encounter (Signed)
Patient sent jury summons She requested a letter Letter was dictated Please print and put with jury summons thank you for the patient to pick up

## 2022-07-28 NOTE — Progress Notes (Signed)
Staff-please print and then have the patient pick up the letter along with the jury summons that they dropped off here

## 2022-09-20 ENCOUNTER — Telehealth: Payer: Self-pay

## 2022-09-20 ENCOUNTER — Emergency Department (HOSPITAL_COMMUNITY): Payer: Medicare Other

## 2022-09-20 ENCOUNTER — Encounter (HOSPITAL_COMMUNITY): Payer: Self-pay | Admitting: Emergency Medicine

## 2022-09-20 ENCOUNTER — Other Ambulatory Visit: Payer: Self-pay

## 2022-09-20 ENCOUNTER — Emergency Department (HOSPITAL_COMMUNITY)
Admission: EM | Admit: 2022-09-20 | Discharge: 2022-09-20 | Disposition: A | Discharge status: Home / Self Care | Payer: Medicare Other | Attending: Emergency Medicine | Admitting: Emergency Medicine

## 2022-09-20 DIAGNOSIS — Z79899 Other long term (current) drug therapy: Secondary | ICD-10-CM | POA: Insufficient documentation

## 2022-09-20 DIAGNOSIS — I10 Essential (primary) hypertension: Secondary | ICD-10-CM | POA: Diagnosis not present

## 2022-09-20 DIAGNOSIS — R079 Chest pain, unspecified: Secondary | ICD-10-CM | POA: Diagnosis not present

## 2022-09-20 DIAGNOSIS — R0789 Other chest pain: Secondary | ICD-10-CM | POA: Diagnosis not present

## 2022-09-20 DIAGNOSIS — E119 Type 2 diabetes mellitus without complications: Secondary | ICD-10-CM | POA: Diagnosis not present

## 2022-09-20 HISTORY — DX: Unspecified hearing loss, unspecified ear: H91.90

## 2022-09-20 LAB — CBC WITH DIFFERENTIAL/PLATELET
Abs Immature Granulocytes: 0.03 10*3/uL (ref 0.00–0.07)
Basophils Absolute: 0.1 10*3/uL (ref 0.0–0.1)
Basophils Relative: 1 %
Eosinophils Absolute: 0.2 10*3/uL (ref 0.0–0.5)
Eosinophils Relative: 3 %
HCT: 40.8 % (ref 36.0–46.0)
Hemoglobin: 13.5 g/dL (ref 12.0–15.0)
Immature Granulocytes: 0 %
Lymphocytes Relative: 28 %
Lymphs Abs: 2.7 10*3/uL (ref 0.7–4.0)
MCH: 31.8 pg (ref 26.0–34.0)
MCHC: 33.1 g/dL (ref 30.0–36.0)
MCV: 96 fL (ref 80.0–100.0)
Monocytes Absolute: 0.8 10*3/uL (ref 0.1–1.0)
Monocytes Relative: 8 %
Neutro Abs: 5.8 10*3/uL (ref 1.7–7.7)
Neutrophils Relative %: 60 %
Platelets: 219 10*3/uL (ref 150–400)
RBC: 4.25 MIL/uL (ref 3.87–5.11)
RDW: 12.1 % (ref 11.5–15.5)
WBC: 9.5 10*3/uL (ref 4.0–10.5)
nRBC: 0 % (ref 0.0–0.2)

## 2022-09-20 LAB — BASIC METABOLIC PANEL
Anion gap: 9 (ref 5–15)
BUN: 21 mg/dL (ref 8–23)
CO2: 29 mmol/L (ref 22–32)
Calcium: 9.3 mg/dL (ref 8.9–10.3)
Chloride: 98 mmol/L (ref 98–111)
Creatinine, Ser: 0.82 mg/dL (ref 0.44–1.00)
GFR, Estimated: >60 mL/min (ref >60)
Glucose, Bld: 129 mg/dL — ABNORMAL HIGH (ref 70–99)
Potassium: 3.8 mmol/L (ref 3.5–5.1)
Sodium: 136 mmol/L (ref 135–145)

## 2022-09-20 LAB — TROPONIN I (HIGH SENSITIVITY)
Troponin I (High Sensitivity): 6 ng/L (ref <18)
Troponin I (High Sensitivity): 6 ng/L (ref <18)

## 2022-09-20 NOTE — Discharge Instructions (Signed)
Someone from the cardiology office should be contacting you to arrange a follow-up appointment time regarding further evaluation of your chest pain.  If you do not hear from them within 24 hours, please contact their office.  Continue to take your blood pressure medications as directed.  You may try wearing nonprescription, low compression socks during the day.  Please follow-up with your primary care provider for recheck, return to the emergency department for any new or worsening symptoms.

## 2022-09-20 NOTE — ED Triage Notes (Signed)
Pt BIB daughter with c/o hypertension since Sat, home readings 170s-200/90-115; triage BP 156/79, pt reports she has not missed any of her home BP meds

## 2022-09-20 NOTE — ED Provider Notes (Signed)
[facility] Provider Note   CSN: [id number] Arrival date & time: 09/20/22  1046     History  Chief Complaint  Patient presents with   Hypertension    Deanna Salinas is a 87 y.o. female.   Hypertension Associated symptoms include chest pain. Pertinent negatives include no abdominal pain, no headaches and no shortness of breath.       Deanna Salinas is a 87 y.o. female with past medical history of hypertension, hyperlipidemia, and type 2 diabetes who presents to the Emergency Department for evaluation of elevated blood pressure and chest tightness.  She states she takes antihypertensive medications regularly, has not missed any doses but has noticed elevated blood pressures at home x 2 days.  States her systolic readings at home have ranged from the 170s to 200s.  She took her blood pressure medication today.  She also notes having some intermittent dull pains of her chest.  She describes these pains as fleeting, she does not have any chest pain at this time.  She denies any headache, dizziness, visual changes, numbness or weakness of her face or extremities.  No neck, arm or jaw pain    Home Medications Prior to Admission medications   Medication Sig Start Date End Date Taking? Authorizing Provider  Accu-Chek Softclix Lancets lancets E11.9 USE TO CHECK GLUCOSE ONCE DAILY 04/28/22   Kathyrn Drown, MD  albuterol (VENTOLIN HFA) 108 (90 Base) MCG/ACT inhaler Inhale 2 puffs into the lungs every 6 (six) hours as needed for wheezing or shortness of breath. Patient not taking: Reported on 06/24/2022 06/05/20   Barton Dubois, MD  amLODipine (NORVASC) 2.5 MG tablet Take 1 tablet (2.5 mg total) by mouth daily. Patient not taking: Reported on 06/24/2022 10/19/21   Kathyrn Drown, MD  BAYER CONTOUR NEXT TEST test strip USE AS DIRECTED 07/28/16   Kathyrn Drown, MD  glucose blood (ACCU-CHEK GUIDE) test strip E11.9 USE 1 STRIP TO CHECK GLUCOSE  ONCE DAILY Patient not taking: Reported on 06/24/2022 04/28/22   Kathyrn Drown, MD  glucose blood test strip 1 each by Other route as needed for other. Use as instructed Patient not taking: Reported on 06/24/2022 02/02/21   Sallee Lange A, MD  glucose blood test strip Use to check glucose once daily 03/09/21   Kathyrn Drown, MD  Ibuprofen 200 MG CAPS Take by mouth as needed. Patient not taking: Reported on 06/24/2022    [provider]  lidocaine (LIDODERM) 5 % Place 1 patch onto the skin daily. Remove & Discard patch within 12 hours or as directed by MD 01/19/21   Eugenie Filler, MD  melatonin 5 MG TABS Take 1 tablet (5 mg total) by mouth at bedtime. 01/18/21   Eugenie Filler, MD  metoprolol tartrate (LOPRESSOR) 25 MG tablet Take 0.5 tablets (12.5 mg total) by mouth 2 (two) times daily. Patient not taking: Reported on 06/24/2022 10/19/21   Kathyrn Drown, MD  Multiple Vitamin (MULTI-VITAMIN DAILY PO) Take 1 tablet by mouth daily.    [provider]  mupirocin ointment (BACTROBAN) 2 % Apply once daily to the ear for 30 days Patient not taking: Reported on 06/24/2022 09/18/21   Kathyrn Drown, MD  nortriptyline (PAMELOR) 10 MG capsule TAKE 1 TO 2 CAPSULES BY MOUTH AT BEDTIME AS DIRECTED. Patient not taking: Reported on 06/24/2022 02/10/22   Kathyrn Drown, MD  potassium chloride (KLOR-CON) 10 MEQ tablet Take one bid  prn when using demadex Patient not taking: Reported on 06/24/2022 03/09/21   Kathyrn Drown, MD  temazepam (RESTORIL) 30 MG capsule TAKE 1 CAPSULE BY MOUTH AT BEDTIME AS NEEDED FOR SLEEP. 06/28/22   Kathyrn Drown, MD  torsemide (DEMADEX) 20 MG tablet Take one tablet qam prn swelling Patient not taking: Reported on 06/24/2022 01/21/21   Kathyrn Drown, MD  traMADol (ULTRAM) 50 MG tablet 1 twice daily as needed severe pain use sparingly not with temazepam 02/02/21   Luking, Elayne Snare, MD  traMADol (ULTRAM-ER) 100 MG 24 hr tablet TAKE 1 TABLET BY MOUTHTONCE DAILY  AT 3PM. 05/06/22   [provider]      Allergies    Patient has no active allergies.    Review of Systems   Review of Systems  Constitutional:  Negative for chills and fever.  Eyes:  Negative for visual disturbance.  Respiratory:  Negative for shortness of breath.   Cardiovascular:  Positive for chest pain.  Gastrointestinal:  Negative for abdominal pain, nausea and vomiting.  Genitourinary:  Negative for dysuria.  Musculoskeletal:  Negative for back pain and neck pain.  Neurological:  Negative for dizziness, syncope, facial asymmetry, weakness, numbness and headaches.  Psychiatric/Behavioral:  Negative for confusion.     Physical Exam Updated Vital Signs BP 138/74   Pulse 71   Temp 97.8 F (36.6 C)   Resp (!) 21   Ht 5' 6"$  (1.676 m)   Wt 91.6 kg   SpO2 94%   BMI 32.60 kg/m  Physical Exam Vitals and nursing note reviewed.  Constitutional:      General: She is not in acute distress.    Appearance: Normal appearance. She is not ill-appearing.  Eyes:     Extraocular Movements: Extraocular movements intact.     Conjunctiva/sclera: Conjunctivae normal.     Pupils: Pupils are equal, round, and reactive to light.  Neck:     Vascular: No carotid bruit.  Cardiovascular:     Rate and Rhythm: Normal rate and regular rhythm.     Pulses: Normal pulses.  Pulmonary:     Effort: Pulmonary effort is normal. No respiratory distress.     Breath sounds: No wheezing.  Abdominal:     Palpations: Abdomen is soft.     Tenderness: There is no abdominal tenderness.  Musculoskeletal:     Cervical back: Normal range of motion. No tenderness.     Right lower leg: No edema.     Left lower leg: No edema.  Skin:    General: Skin is warm.     Capillary Refill: Capillary refill takes less than 2 seconds.     Findings: No rash.  Neurological:     General: No focal deficit present.     Mental Status: She is alert.     Cranial Nerves: Cranial nerves 2-12 are intact.     Sensory:  Sensation is intact. No sensory deficit.     Motor: Motor function is intact. No weakness.     Coordination: Coordination is intact.     ED Results / Procedures / Treatments   Labs (all labs ordered are listed, but only abnormal results are displayed) Labs Reviewed  BASIC METABOLIC PANEL - Abnormal; Notable for the following components:      Result Value   Glucose, Bld 129 (*)    All other components within normal limits  CBC WITH DIFFERENTIAL/PLATELET  TROPONIN I (HIGH SENSITIVITY)  TROPONIN I (HIGH SENSITIVITY)    EKG EKG  Interpretation  Date/Time:  Monday September 20 2022 13:54:08 EST Ventricular Rate:  76 PR Interval:  188 QRS Duration: 122 QT Interval:  426 QTC Calculation: 479 R Axis:   -18 Text Interpretation: Sinus rhythm IVCD, consider atypical RBBB Confirmed by Margaretmary Eddy [phone]) on 09/20/2022 3:12:31 PM  Radiology DG Chest Portable 1 View  Result Date: 09/20/2022 CLINICAL DATA:  Chest pain. EXAM: PORTABLE CHEST 1 VIEW COMPARISON:  01/13/2021. FINDINGS: Similar elevation of the left hemidiaphragm. No focal airspace opacity. Stable cardiac and mediastinal contours. No pleural effusion or pneumothorax. Severe degenerative changes of the bilateral glenohumeral joints. IMPRESSION: 1. No evidence of acute cardiopulmonary disease. 2. Severe degenerative changes of the bilateral glenohumeral joints. Electronically Signed   By: Emmit Alexanders M.D.   On: 09/20/2022 13:41    Procedures Procedures    Medications Ordered in ED Medications - No data to display  ED Course/ Medical Decision Making/ A&P                             Medical Decision Making Patient here for evaluation of elevated blood pressure from baseline.  Denies any missed doses of her antihypertensive.  She also admits to having intermittent, fleeting pains of her left chest.  Pain has not been associated with shortness of breath arm neck or jaw pain.  No history of ACS.   Clinically, patient has  not been significantly hypertensive here.  She has no focal neurodeficits and she is well-appearing.  Since she is complaining of some chest pains, I will obtain labs, EKG and chest x-ray for further evaluation.  Amount and/or Complexity of Data Reviewed Labs: ordered.    Details: Labs interpreted by me, no evidence of leukocytosis, chemistries without significant derangement or evidence of endorgan damage.  Delta troponin remains flat  Radiology: ordered.    Details: Chest x-ray without acute cardiopulmonary process ECG/medicine tests: ordered.    Details: EKG shows sinus rhythm IVCD and right bundle branch block Discussion of management or test interpretation with external provider(s): Patient also seen by Dr. Philip Aspen care plan discussed. Workup today reassuring.  No evidence of ACS or hypertensive emergency here.  No evidence of endorgan damage.  Patient takes amlodipine and metoprolol, will continue her recommended dosage.  Appears appropriate for discharge home, recommend patient to wear nonprescription, low compression socks.  Will provide ambulatory referral for cardiology.           Final Clinical Impression(s) / ED Diagnoses Final diagnoses:  Chest pain, unspecified type  Essential hypertension    Rx / DC Orders ED Discharge Orders     None         Kem Parkinson, PA-C 09/24/22 1234    Fransico Meadow, MD 09/27/22 [phone]

## 2022-09-21 NOTE — Telephone Encounter (Signed)
error 

## 2022-09-23 ENCOUNTER — Telehealth: Payer: Self-pay

## 2022-09-23 ENCOUNTER — Other Ambulatory Visit: Payer: Self-pay | Admitting: Family Medicine

## 2022-09-23 DIAGNOSIS — G4700 Insomnia, unspecified: Secondary | ICD-10-CM

## 2022-09-23 NOTE — Transitions of Care (Post Inpatient/ED Visit) (Signed)
   09/23/2022  Name: Deanna Salinas MRN: [id number] DOB: 1936-02-04  Today's TOC FU Call Status: Today's TOC FU Call Status:: Unsuccessul Call (1st Attempt) Unsuccessful Call (1st Attempt) Date: 09/23/22  Attempted to reach the patient regarding the most recent Inpatient/ED visit.  Follow Up Plan: Additional outreach attempts will be made to reach the patient to complete the Transitions of Care (Post Inpatient/ED visit) call.     Enzo Montgomery, RN,BSN,CCM [facility] Telephonic Care Management Coordinator Direct Phone: [phone] Toll Free: [phone] Fax: [phone]

## 2022-09-24 ENCOUNTER — Telehealth: Payer: Self-pay

## 2022-09-24 NOTE — Transitions of Care (Post Inpatient/ED Visit) (Signed)
   09/24/2022  Name: Deanna Salinas MRN: [id number] DOB: 12-20-1935  Today's TOC FU Call Status: Today's TOC FU Call Status:: Unsuccessful Call (2nd Attempt) Unsuccessful Call (2nd Attempt) Date: 09/24/22  Attempted to reach the patient regarding the most recent Inpatient/ED visit.  Follow Up Plan: Additional outreach attempts will be made to reach the patient to complete the Transitions of Care (Post Inpatient/ED visit) call.     Enzo Montgomery, RN,BSN,CCM [facility] Telephonic Care Management Coordinator Direct Phone: [phone] Toll Free: [phone] Fax: [phone]

## 2022-09-27 ENCOUNTER — Telehealth: Payer: Self-pay

## 2022-09-27 NOTE — Transitions of Care (Post Inpatient/ED Visit) (Signed)
   09/27/2022  Name: [name] MRN: [id number] DOB: 1936/04/08  Today's TOC FU Call Status: Unsuccessful Call (3rd Attempt) Date: 09/27/22  Attempted to reach the patient regarding the most recent Inpatient/ED visit.  Follow Up Plan: No further outreach attempts will be made at this time. We have been unable to contact the patient.     Enzo Montgomery, RN,BSN,CCM [facility] Telephonic Care Management Coordinator Direct Phone: [phone] Toll Free: [phone] Fax: [phone]

## 2022-10-04 ENCOUNTER — Ambulatory Visit (INDEPENDENT_AMBULATORY_CARE_PROVIDER_SITE_OTHER): Payer: Medicare Other | Admitting: Family Medicine

## 2022-10-04 VITALS — BP 136/80 | HR 70

## 2022-10-04 DIAGNOSIS — E038 Other specified hypothyroidism: Secondary | ICD-10-CM

## 2022-10-04 DIAGNOSIS — E785 Hyperlipidemia, unspecified: Secondary | ICD-10-CM

## 2022-10-04 DIAGNOSIS — E1169 Type 2 diabetes mellitus with other specified complication: Secondary | ICD-10-CM

## 2022-10-04 DIAGNOSIS — M791 Myalgia, unspecified site: Secondary | ICD-10-CM | POA: Diagnosis not present

## 2022-10-04 DIAGNOSIS — M2550 Pain in unspecified joint: Secondary | ICD-10-CM | POA: Diagnosis not present

## 2022-10-04 DIAGNOSIS — G4700 Insomnia, unspecified: Secondary | ICD-10-CM

## 2022-10-04 DIAGNOSIS — E119 Type 2 diabetes mellitus without complications: Secondary | ICD-10-CM | POA: Diagnosis not present

## 2022-10-04 DIAGNOSIS — T466X5A Adverse effect of antihyperlipidemic and antiarteriosclerotic drugs, initial encounter: Secondary | ICD-10-CM

## 2022-10-04 MED ORDER — TEMAZEPAM 30 MG PO CAPS: ORAL_CAPSULE | ORAL | 4 refills | Status: DC

## 2022-10-04 NOTE — Addendum Note (Signed)
Addended by: Sallee Lange A on: 10/04/2022 12:27 PM   Modules accepted: Orders

## 2022-10-04 NOTE — Progress Notes (Addendum)
Subjective:    Patient ID: Deanna Salinas, female    DOB: [birth year], 87 y.o.   MRN: [id number]  HPI Patient arrives today for ER follow up.  She went to ER because blood pressure elevated.  She felt a heaviness in her chest.  Testing was negative. Type 2 diabetes mellitus without complication, without long-term current use of insulin (HCC) - Plan: Hemoglobin A1c, Hepatic Function Panel, Basic Metabolic Panel (7), Microalbumin/Creatinine Ratio, Urine  Hyperlipidemia associated with type 2 diabetes mellitus ([place]) - Plan: Lipid panel, Hepatic Function Panel, Microalbumin/Creatinine Ratio, Urine  Myalgia due to statin  Insomnia, unspecified type  Arthralgia, unspecified joint  Subclinical hypothyroidism - Plan: TSH  Patient has back pain discomfort she is on tramadol for that seems to be doing okay for her but she states she hurts all the time she gets frustrated by this.  Keeps her from doing the things she would like to do  She has had previous sed rates done in the past which were negative for any type of polymyalgia rheumatica  Diabetes been under fair control watching diet needs to have up-to-date lab work  Hyperlipidemia but intolerant of statins  Insomnia issues uses the Restoril at nighttime she understands that we cannot go up on the dose of this.  Ideally she would not be on this medicine but prior to this medicine she was not sleeping at all she was using a drink of alcohol in the night to help her sleep we tried behavioral techniques tried melatonin none of this was successful  She was reminded to get her eye exam Labs ordered today  BReview of Systems     Objective:   Physical Exam General-in no acute distress Eyes-no discharge Lungs-respiratory rate normal, CTA CV-no murmurs,RRR Extremities skin warm dry no edema Neuro grossly normal Behavior normal, alert        Assessment & Plan:  1. Type 2 diabetes mellitus without complication, without long-term  current use of insulin (HCC) Check A1c, previous A1c 6.1, healthy diet recommended - Hemoglobin A1c - Hepatic Function Panel - Basic Metabolic Panel (7) - Microalbumin/Creatinine Ratio, Urine  2. Hyperlipidemia associated with type 2 diabetes mellitus ([place]) Check lipid profile before next follow-up visit patient unable to tolerate statin - Lipid panel - Hepatic Function Panel - Microalbumin/Creatinine Ratio, Urine  3. Myalgia due to statin To tolerate statin  4. Insomnia, unspecified type See discussion above Continue the temazepam Long-term I do not feel there is any better answers We will not add additional medicines at this  5. Arthralgia, unspecified joint She has severe osteoarthritis of the knees uses a walker to get around we gave her a prescription for a wheelchair I do not feel that Medicare will cover her for scooter but I told her they could talk with her insurance carrier and if they needed a prescription to let us know  Wheelchair would allow her to complete more of her ADLs within the household as well as make it easier for family to take her out of the house for doctor appointments and similar functions  Preferably a lightweight wheelchair because both patients the patient and her husband are significantly up in age and have significant weakness associated with her medical conditions and do not have strong enough arms to propel themselves in a standard wheelchair and cannot lift a standard wheelchair into the car  She would need a wheelchair that would accommodate for her height and weight. 6. Subclinical hypothyroidism Recheck thyroid - TSH

## 2022-10-21 ENCOUNTER — Other Ambulatory Visit: Payer: Self-pay | Admitting: Family Medicine

## 2022-10-21 DIAGNOSIS — I10 Essential (primary) hypertension: Secondary | ICD-10-CM

## 2022-10-27 ENCOUNTER — Ambulatory Visit: Payer: Medicare Other | Admitting: Family Medicine

## 2022-11-08 NOTE — Progress Notes (Unsigned)
Cardiology Office Note  Date: 11/09/2022   ID: Deanna Salinas, DOB 05-14-1936, MRN [id number]  History of Present Illness: Deanna Salinas is an 87 y.o. female last seen in the office back in August 2021 by Ms. Strader PA-C, I reviewed the note.  She presents now for follow-up with ER visit in February for evaluation of elevated blood pressure and chest pain, had a subsequent office visit with Dr. Wolfgang Phoenix as well.  ECG from ER encounter showed no acute ST segment changes and high-sensitivity troponin I levels were normal.  She is here today with her husband.  Using a rolling walker today.  We discussed her medications and recent home blood pressure checks.  They did not bring in numbers for review, but it sounds like her systolics are often in the 140s to [id number] and diastolics in the [id number] to 0000000.  She is on low doses of both Norvasc and Lopressor per chart review.  I rechecked her blood pressure today after several minutes at 144/90 with heart rate in the 80s.  We discussed up titration of Lopressor for now to 25 mg twice daily.  May need higher dose Norvasc eventually depending on how she does.  She has a visit with Dr. Wolfgang Phoenix in the next few months.  Physical Exam: VS:  BP (!) 144/90 (BP Location: Right Arm)   Pulse 85   Ht 5\' 7"  (1.702 m)   Wt 200 lb (90.7 kg)   SpO2 95%   BMI 31.32 kg/m , BMI Body mass index is 31.32 kg/m.  Wt Readings from Last 3 Encounters:  11/09/22 200 lb (90.7 kg)  09/20/22 202 lb (91.6 kg)  05/20/22 202 lb (91.6 kg)    General: Patient appears comfortable at rest. HEENT: Conjunctiva and lids normal. Neck: Supple, no elevated JVP or carotid bruits, no thyromegaly. Lungs: Clear to auscultation, nonlabored breathing at rest. Cardiac: Regular rate and rhythm, no S3, 1/6 systolic murmur.  ECG:  An ECG dated 09/20/2022 was personally reviewed today and demonstrated:  Sinus rhythm with R' in lead V1.  Labwork: 09/20/2022: BUN 21; Creatinine, Ser 0.82; Hemoglobin  13.5; Platelets 219; Potassium 3.8; Sodium 136     Component Value Date/Time   CHOL 183 09/15/2021 1142   TRIG 107 09/15/2021 1142   HDL 60 09/15/2021 1142   CHOLHDL 3.1 09/15/2021 1142   CHOLHDL 4.2 09/02/2014 1125   VLDL 31 09/02/2014 1125   LDLCALC 104 (H) 09/15/2021 1142   Other Studies Reviewed Today:  Echocardiogram 01/14/2021:  1. Left ventricular ejection fraction, by estimation, is 60 to 65%. The  left ventricle has normal function. The left ventricle has no regional  wall motion abnormalities. There is mild left ventricular hypertrophy.  Left ventricular diastolic parameters  are consistent with Grade I diastolic dysfunction (impaired relaxation).   2. Right ventricular systolic function is normal. The right ventricular  size is normal. There is mildly elevated pulmonary artery systolic  pressure. The estimated right ventricular systolic pressure is 0000000 mmHg.   3. The mitral valve is normal in structure. No evidence of mitral valve  regurgitation. No evidence of mitral stenosis.   4. The aortic valve is calcified. Aortic valve regurgitation is not  visualized. Mild aortic valve sclerosis is present, with no evidence of  aortic valve stenosis.   5. The inferior vena cava is normal in size with greater than 50%  respiratory variability, suggesting right atrial pressure of 3 mmHg.  Assessment and Plan:  Essential hypertension.  We reviewed her medications today and plan to increase Lopressor to 25 mg twice daily for now.  Continue to track blood pressure at home.  May need to uptitrate Norvasc eventually although not would not be overly aggressive with lowering systolics.  Reasonable to target an average systolic of [id number] at this point.  Keep follow-up with Dr. Wolfgang Phoenix.  Disposition:  Follow up  6 months.  Signed, Satira Sark, M.D., F.A.C.C. [facility] [place] [facility]

## 2022-11-09 ENCOUNTER — Ambulatory Visit: Payer: Medicare Other | Attending: Cardiology | Admitting: Cardiology

## 2022-11-09 ENCOUNTER — Encounter: Payer: Self-pay | Admitting: Cardiology

## 2022-11-09 VITALS — BP 144/90 | HR 85 | Ht 67.0 in | Wt 200.0 lb

## 2022-11-09 DIAGNOSIS — I10 Essential (primary) hypertension: Secondary | ICD-10-CM | POA: Insufficient documentation

## 2022-11-09 MED ORDER — METOPROLOL TARTRATE 25 MG PO TABS: 25.0000 mg | ORAL_TABLET | Freq: Two times a day (BID) | ORAL | 3 refills | Status: DC

## 2022-11-09 NOTE — Patient Instructions (Signed)
Medication Instructions:   INCREASE Lopressor to 25 mg Twice a day  Labwork: None today  Testing/Procedures: None today  Follow-Up: 6 months  Any Other Special Instructions Will Be Listed Below (If Applicable).  If you need a refill on your cardiac medications before your next appointment, please call your pharmacy.

## 2022-11-19 ENCOUNTER — Other Ambulatory Visit: Payer: Self-pay | Admitting: Family Medicine

## 2022-11-19 DIAGNOSIS — I10 Essential (primary) hypertension: Secondary | ICD-10-CM

## 2022-11-19 DIAGNOSIS — G4700 Insomnia, unspecified: Secondary | ICD-10-CM

## 2022-11-22 ENCOUNTER — Encounter: Payer: Self-pay | Admitting: Orthopedic Surgery

## 2022-11-22 ENCOUNTER — Ambulatory Visit (INDEPENDENT_AMBULATORY_CARE_PROVIDER_SITE_OTHER): Payer: Medicare Other | Admitting: Orthopedic Surgery

## 2022-11-22 ENCOUNTER — Other Ambulatory Visit (INDEPENDENT_AMBULATORY_CARE_PROVIDER_SITE_OTHER): Payer: Medicare Other

## 2022-11-22 ENCOUNTER — Other Ambulatory Visit: Payer: Self-pay

## 2022-11-22 VITALS — BP 143/94 | HR 91 | Ht 67.0 in | Wt 200.0 lb

## 2022-11-22 DIAGNOSIS — M170 Bilateral primary osteoarthritis of knee: Secondary | ICD-10-CM | POA: Diagnosis not present

## 2022-11-22 DIAGNOSIS — G8929 Other chronic pain: Secondary | ICD-10-CM

## 2022-11-22 NOTE — Patient Instructions (Signed)
If you want to have allergy testing for metal, a dermatologist can do that we can set up a referral if you want to proceed with surgery  Any surgery will be done at [facility] instead of [facility] due to high risk of surgery If you want to be referred to a surgeon at [facility] please let us know

## 2022-11-22 NOTE — Progress Notes (Signed)
Office Visit Note   Patient: Deanna Salinas           Date of Birth: June 05, 1936           MRN: [id number] Visit Date: 11/22/2022 Requested by: Babs Sciara, MD [address] [place],  [place] [address] PCP: Babs Sciara, MD  Subjective: Chief Complaint  Patient presents with   Knee Pain    Bilateral     HPI: 87-year-old female with chronic pain degenerative disc disease osteoarthritis both knees presents for evaluation of bilateral knee pain.  She has not walked really without a walker in years she also has a chronic urine output and bladder wears a week also has chronic degenerative disc disease she walks about 90 feet a day  She has been followed by Dr. Hilton Sinclair and [place] sure in the past.  She says she does not want surgery  She is also concerned that her family has had reactions to the metal various implants              ROS: Chronic pain  Assessment & Plan:  Images personally read and my interpretation : Images in our office show severe arthritis of both knees grade 4 with osteopenia  Visit Diagnoses:  1. Bilateral chronic knee pain     Plan: The patient does not want surgery.  If she did need surgery she will have to go to a tertiary care center because of her bladder incontinence chronic pain and deconditioning.  She would also need a skin test to test for metal allergy prior to surgery  However, she does not want surgery injections will not help so no follow-up was scheduled  Follow-Up Instructions: Return for NO FU SCHEDULED.   Orders:  No orders of the defined types were placed in this encounter.     Objective: Vital Signs: BP (!) 143/94   Pulse 91   Ht  (1.702 m)   Wt 200 lb (90.7 kg)   BMI 31.32 kg/m   Physical Exam Vitals and nursing note reviewed.  Constitutional:      Appearance: Normal appearance.  HENT:     Head: Normocephalic and atraumatic.  Eyes:     General: No scleral icterus.       Right eye: No discharge.         Left eye: No discharge.     Extraocular Movements: Extraocular movements intact.     Conjunctiva/sclera: Conjunctivae normal.     Pupils: Pupils are equal, round, and reactive to light.  Cardiovascular:     Rate and Rhythm: Normal rate.     Pulses: Normal pulses.  Skin:    General: Skin is warm and dry.     Capillary Refill: Capillary refill takes less than 2 seconds.  Neurological:     General: No focal deficit present.     Mental Status: She is alert and oriented to person, place, and time.  Psychiatric:        Mood and Affect: Mood normal.        Behavior: Behavior normal.        Thought Content: Thought content normal.        Judgment: Judgment normal.      Ortho Exam She is basically confined to a wheelchair.  She has chronic flexion contractures decreased range of motion in both knees tenderness to the joint of both knees without effusions  Specialty Comments:  No specialty comments available.  Imaging: DG  Knee AP/LAT W/Sunrise Left  Result Date: 11/22/2022 Imaging right and left knee 3 views each Right knee Valgus right knee complete obliteration of the lateral compartment near obliteration of the medial compartment similar findings on the left knee 3 compartment disease multiple osteophytes patella centered Lateral x-ray and patella on the left knee also confirmed 3 compartment disease Grade 4 arthritis both knees   DG Knee AP/LAT W/Sunrise Right  Result Date: 11/22/2022 Imaging right and left knee 3 views each Right knee Valgus right knee complete obliteration of the lateral compartment near obliteration of the medial compartment similar findings on the left knee 3 compartment disease multiple osteophytes patella centered Lateral x-ray and patella on the left knee also confirmed 3 compartment disease Grade 4 arthritis both knees     PMFS History: Patient Active Problem List   Diagnosis Date Noted   Hearing impaired 06/24/2022   Chronic pain syndrome  05/20/2022   Bilateral hearing loss 03/09/2021   Myalgia due to statin 03/09/2021   Fatigue fracture of vertebra, site unspecified, initial encounter for fracture 01/19/2021   Other secondary hypertension 01/19/2021   Multiple fractures of ribs, left side, subsequent encounter for fracture with routine healing 01/19/2021   Diabetes mellitus due to underlying condition with hyperglycemia 01/19/2021   Community acquired pneumonia of left lower lobe of lung    Rib fractures 01/10/2021   Reactive airway disease 06/04/2020   Degenerative lumbar spinal stenosis 07/18/2019   Peripheral neuritis 07/04/2019   Insomnia 07/04/2019   Primary osteoarthritis of both hands 01/23/2018   Primary osteoarthritis of both knees 01/23/2018   Primary osteoarthritis of both feet 01/23/2018   Other idiopathic scoliosis, lumbar region 01/23/2018   DDD (degenerative disc disease), lumbar 01/23/2018   Subclinical hypothyroidism 08/20/2017   Morbid obesity 12/11/2016   Leukocytosis 06/18/2015   Type 2 diabetes mellitus 06/16/2015   Elevated lipase 02/18/2014   POSTMENOPAUSAL OSTEOPOROSIS 02/17/2009   Osteoarthritis 03/29/2008   Chronic insomnia 09/14/2007   Hyperlipidemia associated with type 2 diabetes mellitus 08/26/2006   DEPRESSION 08/26/2006   MIGRAINE HEADACHE 08/26/2006   CATARACT NOS 08/26/2006   Essential hypertension 08/26/2006   SCIATICA 08/26/2006   Past Medical History:  Diagnosis Date   Deaf    Essential hypertension    Fatty liver    Hyperlipidemia    Type 2 diabetes mellitus     Family History  Problem Relation Age of Onset   Cancer Mother    Heart Problems Father    Congestive Heart Failure Father    Cancer Daughter        breast    Hypertension Other    Hypertension Other    Colon cancer Neg Hx     Past Surgical History:  Procedure Laterality Date   ABDOMINAL HYSTERECTOMY     APPENDECTOMY     At the time of hysterectomy   CESAREAN SECTION     X5   COLONOSCOPY      COLONOSCOPY N/A 06/11/2013   Colonic diverticulosis, hyperplastic polyps. no further screening colonoscopies recommended   ESOPHAGOGASTRODUODENOSCOPY N/A 02/20/2014   Procedure: ESOPHAGOGASTRODUODENOSCOPY (EGD);  Surgeon: Corbin Ade, MD;  Location: [facility];  Service: Endoscopy;  Laterality: N/A;  11:00   Social History   Occupational History    Employer: RETIRED  Tobacco Use   Smoking status: Never   Smokeless tobacco: Never  Vaping Use   Vaping Use: Never used  Substance and Sexual Activity   Alcohol use: Yes    Alcohol/week: 3.0 standard  drinks of alcohol    Types: 3 Glasses of wine per week    Comment: occasional   Drug use: No   Sexual activity: Not on file

## 2022-11-23 ENCOUNTER — Telehealth: Payer: Self-pay

## 2022-11-23 NOTE — Telephone Encounter (Signed)
Pt Is calling need Addendum to the Wheelchair RX also have where can they can add foot lift but the RX needs to include this. Resend to [employer]   [phone] [place]

## 2022-11-23 NOTE — Telephone Encounter (Signed)
Please write that on rx and I will sign then to be faxed Dx weakness, morbid obesity

## 2022-11-26 ENCOUNTER — Other Ambulatory Visit: Payer: Self-pay

## 2022-11-26 NOTE — Telephone Encounter (Signed)
Waiting signature

## 2022-12-02 NOTE — Telephone Encounter (Signed)
Script for wheelchair w/ lifts faxed to [employer] supply

## 2022-12-03 ENCOUNTER — Ambulatory Visit: Payer: Medicare Other | Admitting: Cardiology

## 2022-12-16 ENCOUNTER — Other Ambulatory Visit: Payer: Self-pay | Admitting: Family Medicine

## 2023-01-24 ENCOUNTER — Ambulatory Visit: Payer: Medicare Other | Admitting: Family Medicine

## 2023-01-31 ENCOUNTER — Ambulatory Visit: Payer: Medicare Other | Admitting: Podiatry

## 2023-02-11 DIAGNOSIS — M5416 Radiculopathy, lumbar region: Secondary | ICD-10-CM | POA: Diagnosis not present

## 2023-04-14 ENCOUNTER — Other Ambulatory Visit: Payer: Self-pay | Admitting: Family Medicine

## 2023-04-14 DIAGNOSIS — G4700 Insomnia, unspecified: Secondary | ICD-10-CM

## 2023-04-21 DIAGNOSIS — R609 Edema, unspecified: Secondary | ICD-10-CM | POA: Diagnosis not present

## 2023-04-21 DIAGNOSIS — R58 Hemorrhage, not elsewhere classified: Secondary | ICD-10-CM | POA: Diagnosis not present

## 2023-04-21 DIAGNOSIS — W19XXXA Unspecified fall, initial encounter: Secondary | ICD-10-CM | POA: Diagnosis not present

## 2023-04-29 ENCOUNTER — Encounter: Payer: Self-pay | Admitting: Family Medicine

## 2023-04-29 ENCOUNTER — Telehealth: Payer: Self-pay | Admitting: Family Medicine

## 2023-04-29 NOTE — Telephone Encounter (Signed)
Front  I received request for referral for PT and OT to help with patient's strength mobility due to a recent fall  I spoke with [name] and she spoke with referral coordinator- Face-to-face office visit is required in order to do consultation referral for PT and OT by Medicare guidelines  In other words they want do PT and OT without referral therefore I recommend office visit next week with me or if I have absolutely no ability to see her with Eber Jones thank you  I will also send a MyChart message

## 2023-05-09 ENCOUNTER — Telehealth: Payer: Medicare Other | Admitting: Family Medicine

## 2023-05-09 DIAGNOSIS — M2550 Pain in unspecified joint: Secondary | ICD-10-CM | POA: Diagnosis not present

## 2023-05-09 DIAGNOSIS — M48062 Spinal stenosis, lumbar region with neurogenic claudication: Secondary | ICD-10-CM

## 2023-05-09 NOTE — Progress Notes (Addendum)
Subjective:    Patient ID: Deanna Salinas, female    DOB: 10/07/1935, 87 y.o.   MRN: [id number]  HPI Virtual Visit via Video Note  I connected with Elby Beck on 05/09/23 at  2:50 PM EDT by a video enabled telemedicine application and verified that I am speaking with the correct person using two identifiers.  Location: Patient: Home Provider: Office   I discussed the limitations of evaluation and management by telemedicine and the availability of in person appointments. The patient expressed understanding and agreed to proceed.  History of Present Illness:  Patient with significant body aches discomfort stiffness in her hips as well as her knees she has severe osteoarthritis as well as degenerative disc disease.  Because of her chronic pain she is very debilitated she has a hard time walking can only walk a couple steps and then she has to sit because of that she uses a scooter to get around the house or wheelchair she cannot move safely into the shower and out of the shower she is getting to the point where she needs help with bathing.  She cannot do food prep she is totally dependent on her husband for this.  Patient cannot do task such as shopping, laundry, housework  Patient has seen specialist before her situation is inoperable.  She understands to some degree she is at a point in life where she will not necessarily get better Observations/Objective:   Assessment and Plan:   Follow Up Instructions:    I discussed the assessment and treatment plan with the patient. The patient was provided an opportunity to ask questions and all were answered. The patient agreed with the plan and demonstrated an understanding of the instructions.   The patient was advised to call back or seek an in-person evaluation if the symptoms worsen or if the condition fails to improve as anticipated.  I provided 15 minutes of non-face-to-face time during this encounter.   Lilyan Punt,  MD    Review of Systems     Objective:   Physical Exam Patient had virtual visit-video Appears to be in no distress Atraumatic Neuro able to relate and oriented No apparent resp distress Color normal        Assessment & Plan:  Severe osteoarthritis Very debilitated from this Patient is at a point where she would benefit from not only home health but have an aide Apparently her insurance or her Medicare does help provide for help She is open to get consultation she is given the name of a home health agency that she would like to be referred to We will help refer her to [place] [facility] Her long-term prognosis is guarded but she is not hospice at this point  She does use temazepam at nighttime Obviously we would prefer for her not been Medicine to help sleep but before that she was not sleeping at all and we have tried numerous psychiatric medications without success along with behavioral health techniques Several years ago through a verbal consultation with [facility] otologist it was felt that the lesser of 2 evils was to give her a few hours sleep even though temazepam can increase her risk of falls at nighttime  Patient does see a chronic pain management doctor has her on tramadol nothing stronger currently  I did impress upon the patient the need to do an in person visit somewhere in the course of the next several months and lab work at that time  Patient is  homebound We did do face-to-face via video Patient not capable of getting out safely currently She does need help with ambulation she also may well need physical therapy occupational therapy for her spinal stenosis and weakness She would also need to aid to help with bathing and basic ADLs

## 2023-05-12 ENCOUNTER — Other Ambulatory Visit: Payer: Self-pay

## 2023-05-12 DIAGNOSIS — M1712 Unilateral primary osteoarthritis, left knee: Secondary | ICD-10-CM | POA: Diagnosis not present

## 2023-05-12 DIAGNOSIS — Z79891 Long term (current) use of opiate analgesic: Secondary | ICD-10-CM | POA: Diagnosis not present

## 2023-05-12 DIAGNOSIS — M25562 Pain in left knee: Secondary | ICD-10-CM | POA: Diagnosis not present

## 2023-05-12 DIAGNOSIS — M25552 Pain in left hip: Secondary | ICD-10-CM | POA: Diagnosis not present

## 2023-05-12 DIAGNOSIS — M48062 Spinal stenosis, lumbar region with neurogenic claudication: Secondary | ICD-10-CM | POA: Diagnosis not present

## 2023-05-12 DIAGNOSIS — M25551 Pain in right hip: Secondary | ICD-10-CM | POA: Diagnosis not present

## 2023-05-12 DIAGNOSIS — M25561 Pain in right knee: Secondary | ICD-10-CM | POA: Diagnosis not present

## 2023-05-12 DIAGNOSIS — Z7409 Other reduced mobility: Secondary | ICD-10-CM

## 2023-05-12 DIAGNOSIS — M1711 Unilateral primary osteoarthritis, right knee: Secondary | ICD-10-CM | POA: Diagnosis not present

## 2023-05-13 ENCOUNTER — Telehealth: Payer: Self-pay

## 2023-05-13 NOTE — Telephone Encounter (Signed)
Please give verbal order.

## 2023-05-13 NOTE — Telephone Encounter (Signed)
Verbal order given to home health 

## 2023-05-13 NOTE — Telephone Encounter (Signed)
[facility] calling needing verbal order. 1 week 1, 2 week 3 and 1 week 1 PT   [place] [phone]

## 2023-05-16 ENCOUNTER — Other Ambulatory Visit: Payer: Self-pay

## 2023-05-16 DIAGNOSIS — Z7409 Other reduced mobility: Secondary | ICD-10-CM

## 2023-05-16 DIAGNOSIS — M2550 Pain in unspecified joint: Secondary | ICD-10-CM

## 2023-05-16 DIAGNOSIS — M48062 Spinal stenosis, lumbar region with neurogenic claudication: Secondary | ICD-10-CM

## 2023-05-16 NOTE — Progress Notes (Signed)
Referral to home health placed per request.

## 2023-05-17 DIAGNOSIS — M1711 Unilateral primary osteoarthritis, right knee: Secondary | ICD-10-CM | POA: Diagnosis not present

## 2023-05-17 DIAGNOSIS — M25551 Pain in right hip: Secondary | ICD-10-CM | POA: Diagnosis not present

## 2023-05-17 DIAGNOSIS — M25562 Pain in left knee: Secondary | ICD-10-CM | POA: Diagnosis not present

## 2023-05-17 DIAGNOSIS — Z79891 Long term (current) use of opiate analgesic: Secondary | ICD-10-CM | POA: Diagnosis not present

## 2023-05-17 DIAGNOSIS — M1712 Unilateral primary osteoarthritis, left knee: Secondary | ICD-10-CM | POA: Diagnosis not present

## 2023-05-17 DIAGNOSIS — M48062 Spinal stenosis, lumbar region with neurogenic claudication: Secondary | ICD-10-CM | POA: Diagnosis not present

## 2023-05-17 DIAGNOSIS — M25552 Pain in left hip: Secondary | ICD-10-CM | POA: Diagnosis not present

## 2023-05-17 DIAGNOSIS — M25561 Pain in right knee: Secondary | ICD-10-CM | POA: Diagnosis not present

## 2023-05-23 DIAGNOSIS — M25551 Pain in right hip: Secondary | ICD-10-CM | POA: Diagnosis not present

## 2023-05-23 DIAGNOSIS — M1711 Unilateral primary osteoarthritis, right knee: Secondary | ICD-10-CM | POA: Diagnosis not present

## 2023-05-23 DIAGNOSIS — M25562 Pain in left knee: Secondary | ICD-10-CM | POA: Diagnosis not present

## 2023-05-23 DIAGNOSIS — M25552 Pain in left hip: Secondary | ICD-10-CM | POA: Diagnosis not present

## 2023-05-23 DIAGNOSIS — M25561 Pain in right knee: Secondary | ICD-10-CM | POA: Diagnosis not present

## 2023-05-23 DIAGNOSIS — M48062 Spinal stenosis, lumbar region with neurogenic claudication: Secondary | ICD-10-CM | POA: Diagnosis not present

## 2023-05-24 DIAGNOSIS — M25551 Pain in right hip: Secondary | ICD-10-CM | POA: Diagnosis not present

## 2023-05-24 DIAGNOSIS — M48062 Spinal stenosis, lumbar region with neurogenic claudication: Secondary | ICD-10-CM | POA: Diagnosis not present

## 2023-05-24 DIAGNOSIS — M25552 Pain in left hip: Secondary | ICD-10-CM | POA: Diagnosis not present

## 2023-05-24 DIAGNOSIS — M25562 Pain in left knee: Secondary | ICD-10-CM | POA: Diagnosis not present

## 2023-05-24 DIAGNOSIS — M25561 Pain in right knee: Secondary | ICD-10-CM | POA: Diagnosis not present

## 2023-05-24 DIAGNOSIS — M1711 Unilateral primary osteoarthritis, right knee: Secondary | ICD-10-CM | POA: Diagnosis not present

## 2023-05-27 DIAGNOSIS — M25552 Pain in left hip: Secondary | ICD-10-CM | POA: Diagnosis not present

## 2023-05-27 DIAGNOSIS — M48062 Spinal stenosis, lumbar region with neurogenic claudication: Secondary | ICD-10-CM | POA: Diagnosis not present

## 2023-05-27 DIAGNOSIS — M25561 Pain in right knee: Secondary | ICD-10-CM | POA: Diagnosis not present

## 2023-05-27 DIAGNOSIS — M25562 Pain in left knee: Secondary | ICD-10-CM | POA: Diagnosis not present

## 2023-05-27 DIAGNOSIS — M1711 Unilateral primary osteoarthritis, right knee: Secondary | ICD-10-CM | POA: Diagnosis not present

## 2023-05-27 DIAGNOSIS — M25551 Pain in right hip: Secondary | ICD-10-CM | POA: Diagnosis not present

## 2023-05-31 DIAGNOSIS — M25561 Pain in right knee: Secondary | ICD-10-CM | POA: Diagnosis not present

## 2023-05-31 DIAGNOSIS — M25552 Pain in left hip: Secondary | ICD-10-CM | POA: Diagnosis not present

## 2023-05-31 DIAGNOSIS — M48062 Spinal stenosis, lumbar region with neurogenic claudication: Secondary | ICD-10-CM | POA: Diagnosis not present

## 2023-05-31 DIAGNOSIS — M25562 Pain in left knee: Secondary | ICD-10-CM | POA: Diagnosis not present

## 2023-05-31 DIAGNOSIS — M25551 Pain in right hip: Secondary | ICD-10-CM | POA: Diagnosis not present

## 2023-05-31 DIAGNOSIS — M1711 Unilateral primary osteoarthritis, right knee: Secondary | ICD-10-CM | POA: Diagnosis not present

## 2023-06-01 ENCOUNTER — Telehealth: Payer: Self-pay | Admitting: Family Medicine

## 2023-06-01 DIAGNOSIS — M25562 Pain in left knee: Secondary | ICD-10-CM | POA: Diagnosis not present

## 2023-06-01 DIAGNOSIS — M25551 Pain in right hip: Secondary | ICD-10-CM | POA: Diagnosis not present

## 2023-06-01 DIAGNOSIS — M25552 Pain in left hip: Secondary | ICD-10-CM | POA: Diagnosis not present

## 2023-06-01 DIAGNOSIS — M25561 Pain in right knee: Secondary | ICD-10-CM | POA: Diagnosis not present

## 2023-06-01 DIAGNOSIS — M1711 Unilateral primary osteoarthritis, right knee: Secondary | ICD-10-CM | POA: Diagnosis not present

## 2023-06-01 DIAGNOSIS — M48062 Spinal stenosis, lumbar region with neurogenic claudication: Secondary | ICD-10-CM | POA: Diagnosis not present

## 2023-06-01 NOTE — Telephone Encounter (Signed)
[facility] Will called needing verbal orders for one week one and two weeks two for OT Will [phone]

## 2023-06-01 NOTE — Telephone Encounter (Signed)
May have verbal orders thank you 

## 2023-06-02 NOTE — Telephone Encounter (Signed)
Called [facility] Will called needing verbal orders for one week one and two weeks two for OT Will [phone] Verbal orders given per Dr Lilyan Punt.

## 2023-06-06 ENCOUNTER — Telehealth: Payer: Medicare Other | Admitting: Family Medicine

## 2023-06-06 DIAGNOSIS — G4700 Insomnia, unspecified: Secondary | ICD-10-CM

## 2023-06-06 DIAGNOSIS — Z7409 Other reduced mobility: Secondary | ICD-10-CM

## 2023-06-06 NOTE — Progress Notes (Signed)
Patient did have a virtual visit scheduled for 4:10 PM Unfortunately our office was running well behind and patient and myself decided to reschedule this She agrees to come to the office but it is difficult for her to walk into the office so therefore we will do a car visit She overall is trying to do well but states she only wants to take medications that help her with her comfort and does not want to be on any type of medicines that would prolong her life Currently agrees to her sleep medicine and her pain medicine she states she does not abuse it without it her quality of life would suffer

## 2023-06-07 DIAGNOSIS — M25562 Pain in left knee: Secondary | ICD-10-CM | POA: Diagnosis not present

## 2023-06-07 DIAGNOSIS — M25561 Pain in right knee: Secondary | ICD-10-CM | POA: Diagnosis not present

## 2023-06-07 DIAGNOSIS — M25551 Pain in right hip: Secondary | ICD-10-CM | POA: Diagnosis not present

## 2023-06-07 DIAGNOSIS — M25552 Pain in left hip: Secondary | ICD-10-CM | POA: Diagnosis not present

## 2023-06-07 DIAGNOSIS — M48062 Spinal stenosis, lumbar region with neurogenic claudication: Secondary | ICD-10-CM | POA: Diagnosis not present

## 2023-06-07 DIAGNOSIS — M1711 Unilateral primary osteoarthritis, right knee: Secondary | ICD-10-CM | POA: Diagnosis not present

## 2023-06-09 DIAGNOSIS — M25562 Pain in left knee: Secondary | ICD-10-CM | POA: Diagnosis not present

## 2023-06-09 DIAGNOSIS — M25552 Pain in left hip: Secondary | ICD-10-CM | POA: Diagnosis not present

## 2023-06-09 DIAGNOSIS — M48062 Spinal stenosis, lumbar region with neurogenic claudication: Secondary | ICD-10-CM | POA: Diagnosis not present

## 2023-06-09 DIAGNOSIS — M1711 Unilateral primary osteoarthritis, right knee: Secondary | ICD-10-CM | POA: Diagnosis not present

## 2023-06-09 DIAGNOSIS — M25551 Pain in right hip: Secondary | ICD-10-CM | POA: Diagnosis not present

## 2023-06-09 DIAGNOSIS — M25561 Pain in right knee: Secondary | ICD-10-CM | POA: Diagnosis not present

## 2023-06-11 DIAGNOSIS — M1712 Unilateral primary osteoarthritis, left knee: Secondary | ICD-10-CM | POA: Diagnosis not present

## 2023-06-11 DIAGNOSIS — Z79891 Long term (current) use of opiate analgesic: Secondary | ICD-10-CM | POA: Diagnosis not present

## 2023-06-11 DIAGNOSIS — M1711 Unilateral primary osteoarthritis, right knee: Secondary | ICD-10-CM | POA: Diagnosis not present

## 2023-06-11 DIAGNOSIS — M25551 Pain in right hip: Secondary | ICD-10-CM | POA: Diagnosis not present

## 2023-06-11 DIAGNOSIS — M25562 Pain in left knee: Secondary | ICD-10-CM | POA: Diagnosis not present

## 2023-06-11 DIAGNOSIS — M48062 Spinal stenosis, lumbar region with neurogenic claudication: Secondary | ICD-10-CM | POA: Diagnosis not present

## 2023-06-11 DIAGNOSIS — M25552 Pain in left hip: Secondary | ICD-10-CM | POA: Diagnosis not present

## 2023-06-11 DIAGNOSIS — M25561 Pain in right knee: Secondary | ICD-10-CM | POA: Diagnosis not present

## 2023-06-14 DIAGNOSIS — M25561 Pain in right knee: Secondary | ICD-10-CM | POA: Diagnosis not present

## 2023-06-14 DIAGNOSIS — M1711 Unilateral primary osteoarthritis, right knee: Secondary | ICD-10-CM | POA: Diagnosis not present

## 2023-06-14 DIAGNOSIS — M25552 Pain in left hip: Secondary | ICD-10-CM | POA: Diagnosis not present

## 2023-06-14 DIAGNOSIS — M25562 Pain in left knee: Secondary | ICD-10-CM | POA: Diagnosis not present

## 2023-06-14 DIAGNOSIS — M25551 Pain in right hip: Secondary | ICD-10-CM | POA: Diagnosis not present

## 2023-06-14 DIAGNOSIS — M48062 Spinal stenosis, lumbar region with neurogenic claudication: Secondary | ICD-10-CM | POA: Diagnosis not present

## 2023-06-16 ENCOUNTER — Telehealth: Payer: Self-pay

## 2023-06-16 DIAGNOSIS — M48062 Spinal stenosis, lumbar region with neurogenic claudication: Secondary | ICD-10-CM | POA: Diagnosis not present

## 2023-06-16 DIAGNOSIS — M25562 Pain in left knee: Secondary | ICD-10-CM | POA: Diagnosis not present

## 2023-06-16 DIAGNOSIS — M25552 Pain in left hip: Secondary | ICD-10-CM | POA: Diagnosis not present

## 2023-06-16 DIAGNOSIS — M1711 Unilateral primary osteoarthritis, right knee: Secondary | ICD-10-CM | POA: Diagnosis not present

## 2023-06-16 DIAGNOSIS — M25551 Pain in right hip: Secondary | ICD-10-CM | POA: Diagnosis not present

## 2023-06-16 DIAGNOSIS — M25561 Pain in right knee: Secondary | ICD-10-CM | POA: Diagnosis not present

## 2023-06-16 NOTE — Telephone Encounter (Signed)
Called patient and left message for them to call office. (Nurse Note* This is a lot to unpackage via a home health phone call.  I would like to do a consultation with the patient.  I discussed with her previously that we are willing to do a car consult-another words her husband could drive her up here for the appointment but she would not have to come in and I can come out to the car to examine her and listen to her heart and lungs and talk about these issues.  I would recommend scheduling an appointment)   This message is regarding to previous message below. Will-OT called from [facility]. Patient's BP is 157/107, her pain is 8 out of 10. When asked about BP medications. Patient stated she does not take any medication to prolong her life. Will would like to know if we could possibly get referral for Hospice or end of life care.

## 2023-06-16 NOTE — Telephone Encounter (Signed)
Patient advised of provider's recommendations and office visit scheduled 06/27/23 with Dr Lorin Picket

## 2023-06-16 NOTE — Telephone Encounter (Signed)
Will-OT called from [facility]. Patient's BP is 157/107, her pain is 8 out of 10. When asked about BP medications. Patient stated she does not take any medication to prolong her life. Will would like to know if we could possibly get referral for Hospice or end of life care.

## 2023-06-16 NOTE — Telephone Encounter (Signed)
This is a lot to unpackage via a home health phone call.  I would like to do a consultation with the patient.  I discussed with her previously that we are willing to do a car consult-another words her husband could drive her up here for the appointment but she would not have to come in and I can come out to the car to examine her and listen to her heart and lungs and talk about these issues.  I would recommend scheduling an appointment

## 2023-06-27 ENCOUNTER — Encounter: Payer: Self-pay | Admitting: Family Medicine

## 2023-06-27 ENCOUNTER — Ambulatory Visit (INDEPENDENT_AMBULATORY_CARE_PROVIDER_SITE_OTHER): Payer: Medicare Other | Admitting: Family Medicine

## 2023-06-27 VITALS — BP 132/80 | Ht 67.0 in

## 2023-06-27 DIAGNOSIS — E785 Hyperlipidemia, unspecified: Secondary | ICD-10-CM

## 2023-06-27 DIAGNOSIS — M48062 Spinal stenosis, lumbar region with neurogenic claudication: Secondary | ICD-10-CM | POA: Diagnosis not present

## 2023-06-27 DIAGNOSIS — G4700 Insomnia, unspecified: Secondary | ICD-10-CM

## 2023-06-27 DIAGNOSIS — Z7409 Other reduced mobility: Secondary | ICD-10-CM

## 2023-06-27 DIAGNOSIS — E1169 Type 2 diabetes mellitus with other specified complication: Secondary | ICD-10-CM

## 2023-06-27 DIAGNOSIS — E119 Type 2 diabetes mellitus without complications: Secondary | ICD-10-CM

## 2023-06-27 NOTE — Progress Notes (Unsigned)
   Subjective:    Patient ID: Deanna Salinas, female    DOB: Mar 21, 1936, 87 y.o.   MRN: [id number]  Discussed the use of AI scribe software for clinical note transcription with the patient, who gave verbal consent to proceed.  History of Present Illness   The patient, with a history of chronic pain, presents with worsening discomfort throughout the body, particularly in the joints and muscles. The pain is described as a 'nine' on a scale of one to ten, and is reported to be increasing in frequency and duration. The patient experiences pain episodes lasting up to six hours, with the discomfort being constant throughout the day.  The patient also reports a decrease in appetite, expressing a lack of interest in food, but continues to consume mostly vegetables and water. She has been experiencing difficulty with mobility, unable to walk and relying on a walker for movement. The patient also reports using a wheelchair for longer distances.  The patient has been managing her pain with CBD, but has expressed dissatisfaction with the effectiveness of her current pain management regimen. She has been prescribed temazepam for sleep, but there are concerns about the risk of falls due to the medication's long-lasting effects.  The patient has been receiving physical and occupational therapy, focusing on maintaining strength in the upper body to aid in mobility. Despite the chronic pain and mobility issues, the patient expresses a desire to remain at home, with assistance from a caregiver.         Review of Systems     Objective:    Physical Exam        General-in no acute distress Eyes-no discharge Lungs-respiratory rate normal, CTA CV-no murmurs,RRR Extremities skin warm dry no edema Neuro grossly normal Behavior normal, alert   {Labs (Optional):23779}     Assessment & Plan:  Assessment and Plan    Chronic Pain Patient reports constant, fluctuating pain throughout the body, with emphasis  on joints and muscles. Pain episodes are increasing in frequency and duration. Patient is currently using CBD for pain management. -Continue current pain management strategy with CBD.  Mobility Issues Patient is unable to walk and relies on a walker and assistance for mobility. -Continue with current physical and occupational therapy.  Sleep Issues Patient is currently taking temazepam for sleep, but there are concerns about the risk of falls due to the long-acting nature of the medication. -Consider alternative sleep aid to reduce risk of falls.  General Health Maintenance -Complete blood work within the next 30 days. -Continue regular check-ups with Dr. Belva Bertin for medication verification. -Consider potential for in-home doctor visits for convenience.  Living Situation Patient is currently living at home with assistance, but acknowledges that independent living would not be possible. There is a potential future plan to live with a family member if necessary. -Monitor living situation and consider assisted living or moving in with family if patient's condition worsens.      For now we will keep medications as is there are no great choices for her sleep this is been an issue that we have addressed many times in the past we consulted via phone geriatrician that stated that in these situations there are no good options and sometimes using a sleep medicine is about the only thing that can be done so therefore currently we will continue this we have tried other medications which have not been successful  We will order lab work for the patient coming up

## 2023-07-22 DIAGNOSIS — E785 Hyperlipidemia, unspecified: Secondary | ICD-10-CM | POA: Diagnosis not present

## 2023-07-22 DIAGNOSIS — E038 Other specified hypothyroidism: Secondary | ICD-10-CM | POA: Diagnosis not present

## 2023-07-22 DIAGNOSIS — E119 Type 2 diabetes mellitus without complications: Secondary | ICD-10-CM | POA: Diagnosis not present

## 2023-07-22 DIAGNOSIS — E1169 Type 2 diabetes mellitus with other specified complication: Secondary | ICD-10-CM | POA: Diagnosis not present

## 2023-07-26 LAB — HEPATIC FUNCTION PANEL
ALT: 7 [IU]/L (ref 0–32)
AST: 19 [IU]/L (ref 0–40)
Albumin: 4.1 g/dL (ref 3.7–4.7)
Alkaline Phosphatase: 119 [IU]/L (ref 44–121)
Bilirubin Total: 0.5 mg/dL (ref 0.0–1.2)
Bilirubin, Direct: 0.2 mg/dL (ref 0.00–0.40)
Total Protein: 6.7 g/dL (ref 6.0–8.5)

## 2023-07-26 LAB — BASIC METABOLIC PANEL (7)
BUN/Creatinine Ratio: 15 (ref 12–28)
BUN: 12 mg/dL (ref 8–27)
CO2: 26 mmol/L (ref 20–29)
Chloride: 96 mmol/L (ref 96–106)
Creatinine, Ser: 0.82 mg/dL (ref 0.57–1.00)
Glucose: 84 mg/dL (ref 70–99)
Potassium: 4.5 mmol/L (ref 3.5–5.2)
Sodium: 140 mmol/L (ref 134–144)
eGFR: 70 mL/min/{1.73_m2} (ref >59)

## 2023-07-26 LAB — HEMOGLOBIN A1C
Est. average glucose Bld gHb Est-mCnc: 123 mg/dL
Hgb A1c MFr Bld: 5.9 % — ABNORMAL HIGH (ref 4.8–5.6)

## 2023-07-26 LAB — MICROALBUMIN / CREATININE URINE RATIO

## 2023-07-26 LAB — LIPID PANEL
Chol/HDL Ratio: 3.2 {ratio} (ref 0.0–4.4)
Cholesterol, Total: 172 mg/dL (ref 100–199)
HDL: 53 mg/dL (ref >39)
LDL Chol Calc (NIH): 101 mg/dL — ABNORMAL HIGH (ref 0–99)
Triglycerides: 99 mg/dL (ref 0–149)
VLDL Cholesterol Cal: 18 mg/dL (ref 5–40)

## 2023-07-26 LAB — SPECIMEN STATUS REPORT

## 2023-07-26 LAB — TSH: TSH: 3.74 u[IU]/mL (ref 0.450–4.500)

## 2023-08-03 ENCOUNTER — Emergency Department (HOSPITAL_COMMUNITY)
Admission: EM | Admit: 2023-08-03 | Discharge: 2023-08-03 | Disposition: A | Discharge status: Home / Self Care | Payer: Medicare Other | Attending: Emergency Medicine | Admitting: Emergency Medicine

## 2023-08-03 ENCOUNTER — Other Ambulatory Visit: Payer: Self-pay

## 2023-08-03 DIAGNOSIS — M25542 Pain in joints of left hand: Secondary | ICD-10-CM | POA: Insufficient documentation

## 2023-08-03 DIAGNOSIS — M25541 Pain in joints of right hand: Secondary | ICD-10-CM | POA: Diagnosis not present

## 2023-08-03 DIAGNOSIS — M2550 Pain in unspecified joint: Secondary | ICD-10-CM

## 2023-08-03 MED ORDER — TRAMADOL HCL 50 MG PO TABS: ORAL_TABLET | ORAL | 0 refills | Status: DC

## 2023-08-03 MED ORDER — TEMAZEPAM 30 MG PO CAPS: 30.0000 mg | ORAL_CAPSULE | Freq: Every evening | ORAL | 0 refills | Status: DC | PRN

## 2023-08-03 MED ORDER — TRAMADOL HCL 50 MG PO TABS
50.0000 mg | ORAL_TABLET | Freq: Once | ORAL | Status: AC
  Administered 2023-08-03: 50 mg via ORAL
  Filled 2023-08-03: qty 1

## 2023-08-03 NOTE — ED Triage Notes (Signed)
Hx of chronic pain with arthritis. Pain has getting progressively worse over the last 2 days as she has not been able to sleep last 2 nights. Was told by PCP if pain did not get better, to come to ED. States she takes Tramadol for pain and alprazolam to help sleep. Not diabetic, no hx of HTN.

## 2023-08-03 NOTE — ED Provider Notes (Signed)
[place] EMERGENCY DEPARTMENT AT [facility] Provider Note   CSN: [id number] Arrival date & time: 08/03/23  7829     History  Chief Complaint  Patient presents with   Joint Pain    Deanna Salinas is a 87 y.o. female.  Patient with arthritis.  Patient usually takes tramadol for pain and Restoril for sleep.  She is running out of her medicines and will be seeing her doctor next week.  She would like a prescription for tramadol and Restoril to get her through until Tuesday  The history is provided by the patient and medical records. No language interpreter was used.  Hand Pain This is a chronic problem. The current episode started more than 1 week ago. The problem occurs constantly. The problem has not changed since onset.Pertinent negatives include no chest pain, no abdominal pain and no headaches. Nothing aggravates the symptoms. Nothing relieves the symptoms. She has tried nothing for the symptoms.       Home Medications Prior to Admission medications   Medication Sig Start Date End Date Taking? Authorizing Provider  temazepam (RESTORIL) 30 MG capsule Take 1 capsule (30 mg total) by mouth at bedtime as needed for sleep. 08/03/23  Yes Bethann Berkshire, MD  traMADol Janean Sark) 50 MG tablet Take one ever 4-6 hours for pain 08/03/23  Yes Bethann Berkshire, MD  Accu-Chek Softclix Lancets lancets E11.9 USE TO CHECK GLUCOSE ONCE DAILY 04/28/22   Babs Sciara, MD  glucose blood (ACCU-CHEK GUIDE) test strip E11.9 USE 1 STRIP TO CHECK GLUCOSE ONCE DAILY Patient not taking: Reported on 11/22/2022 04/28/22   Babs Sciara, MD  melatonin 5 MG TABS Take 1 tablet (5 mg total) by mouth at bedtime. Patient taking differently: Take 10 mg by mouth at bedtime. 01/18/21   Rodolph Bong, MD  Multiple Vitamin (MULTI-VITAMIN DAILY PO) Take 1 tablet by mouth daily.    [provider]      Allergies    Patient has no active allergies.    Review of Systems   Review of Systems   Constitutional:  Negative for appetite change and fatigue.  HENT:  Negative for congestion, ear discharge and sinus pressure.   Eyes:  Negative for discharge.  Respiratory:  Negative for cough.   Cardiovascular:  Negative for chest pain.  Gastrointestinal:  Negative for abdominal pain and diarrhea.  Genitourinary:  Negative for frequency and hematuria.  Musculoskeletal:  Negative for back pain.       Pain in both hands  Skin:  Negative for rash.  Neurological:  Negative for seizures and headaches.  Psychiatric/Behavioral:  Negative for hallucinations.     Physical Exam Updated Vital Signs BP (!) 172/104 (BP Location: Left Arm)   Pulse 74   Temp 98.1 F (36.7 C) (Oral)   Resp 20   Ht 5\' 7"  (1.702 m)   Wt 86.2 kg   SpO2 95%   BMI 29.76 kg/m  Physical Exam Vitals and nursing note reviewed.  Constitutional:      Appearance: She is well-developed.  HENT:     Head: Normocephalic.     Nose: Nose normal.  Eyes:     General: No scleral icterus.    Conjunctiva/sclera: Conjunctivae normal.  Neck:     Thyroid: No thyromegaly.  Cardiovascular:     Rate and Rhythm: Normal rate and regular rhythm.     Heart sounds: No murmur heard.    No friction rub. No gallop.  Pulmonary:  Breath sounds: No stridor. No wheezing or rales.  Chest:     Chest wall: No tenderness.  Abdominal:     General: There is no distension.     Tenderness: There is no abdominal tenderness. There is no rebound.  Musculoskeletal:     Cervical back: Neck supple.     Comments: Mild swelling and tenderness to both hands no redness  Lymphadenopathy:     Cervical: No cervical adenopathy.  Skin:    Findings: No erythema or rash.  Neurological:     Mental Status: She is alert and oriented to person, place, and time.     Motor: No abnormal muscle tone.     Coordination: Coordination normal.  Psychiatric:        Behavior: Behavior normal.     ED Results / Procedures / Treatments   Labs (all labs  ordered are listed, but only abnormal results are displayed) Labs Reviewed - No data to display  EKG None  Radiology No results found.  Procedures Procedures    Medications Ordered in ED Medications  traMADol (ULTRAM) tablet 50 mg (has no administration in time range)    ED Course/ Medical Decision Making/ A&P                                 Medical Decision Making Risk Prescription drug management.   Patient with arthritic pain and run out of her medicine.  She was written for tramadol 50 mg #40 and Restoril 30 mg for sleep.  She will follow-up with her doctor next week and get her tramadol and Restoril refilled by them also        Final Clinical Impression(s) / ED Diagnoses Final diagnoses:  None    Rx / DC Orders ED Discharge Orders          Ordered    temazepam (RESTORIL) 30 MG capsule  At bedtime PRN        08/03/23 0854    traMADol (ULTRAM) 50 MG tablet        08/03/23 0854              Bethann Berkshire, MD 08/05/23 [phone]

## 2023-08-03 NOTE — Discharge Instructions (Signed)
[facility] in [place].  The address is [address]  Follow-up with your doctor next week

## 2023-08-08 ENCOUNTER — Other Ambulatory Visit: Payer: Self-pay

## 2023-08-08 ENCOUNTER — Encounter: Payer: Self-pay | Admitting: Family Medicine

## 2023-08-08 MED ORDER — TEMAZEPAM 30 MG PO CAPS: 30.0000 mg | ORAL_CAPSULE | Freq: Every evening | ORAL | 3 refills | Status: DC | PRN

## 2023-08-08 NOTE — Telephone Encounter (Signed)
Copied from CRM [id number]. Topic: Clinical - Medication Refill >> Aug 08, 2023  9:13 AM Donita Brooks wrote: Most Recent Primary Care Visit:  Provider: Lilyan Punt A  Department: [facility]  Visit Type: OFFICE VISIT  Date: 06/27/2023  Medication:  temazepam (RESTORIL) 30 MG capsule   Has the patient contacted their pharmacy? Yes (Agent: If no, request that the patient contact the pharmacy for the refill. If patient does not wish to contact the pharmacy document the reason why and proceed with request.) (Agent: If yes, when and what did the pharmacy advise?)  Is this the correct pharmacy for this prescription? Yes If no, delete pharmacy and type the correct one.  This is the patient's preferred pharmacy:  [facility] [place], [place] - [id number] [place] [address] Phone: [phone] Fax: [phone]  [facility] [address] [place] - [address] [place] [address] [place] [address] [place] [address] Phone: [phone] Fax: [phone]  [id number] [place], [place] - [address] AT [facility] [address] [place] [address] Phone: [phone] Fax: [phone]   Has the prescription been filled recently? No  Is the patient out of the medication? Yes  Has the patient been seen for an appointment in the last year OR does the patient have an upcoming appointment? No  Can we respond through MyChart? Yes  Agent: Please be advised that Rx refills may take up to 3 business days. We ask that you follow-up with your pharmacy.

## 2023-08-16 NOTE — Telephone Encounter (Signed)
 Nurses I am sympathetic to her situation but this is becoming very complex beyond what we can manage via MyChart messaging  I would recommend an office visit Certainly we support trying to be of assistance but at the same time it is a delicate balance between safety with prescribing and helping her condition  Recommend follow-up office visit thanks-Dr. Glendia

## 2023-08-23 ENCOUNTER — Ambulatory Visit: Payer: Medicare Other | Admitting: Family Medicine

## 2023-08-25 ENCOUNTER — Telehealth: Payer: Self-pay | Admitting: Family Medicine

## 2023-08-25 NOTE — Telephone Encounter (Signed)
I received a MyChart message from the husband regarding this patient Per protocol this message was transferred over to telephone message since the husband was using his account and not the patient's account Please see the following  Nurses A few different things  #1-birth oh would need to be the one to make the decision regarding hospice  #2-if she is on board with hospice evaluation then the next step would be consultation with and a referral with [facility] to have them come and do an evaluation She may actually be more of a candidate for palliative care  #3-unfortunately-my understanding hospice will only step in if a person has less than 6 months to live.  I certainly understand her having increased pain unable to get up but would not be likely to qualify for hospice Typically individuals with hospice care are cancer patient's end-stage heart disease in stage COPD end-stage dementia etc.  Please call patient-might be beneficial to have social service worker consulted as well If he would like to have consult with Gertie Exon it would be reasonable to see if they could do a consult more so for palliative care thank you

## 2023-08-26 NOTE — Telephone Encounter (Signed)
Contacted patient and informed him per provider's mychart message

## 2023-09-05 ENCOUNTER — Telehealth (INDEPENDENT_AMBULATORY_CARE_PROVIDER_SITE_OTHER): Payer: Medicare Other | Admitting: Family Medicine

## 2023-09-05 DIAGNOSIS — G4700 Insomnia, unspecified: Secondary | ICD-10-CM | POA: Diagnosis not present

## 2023-09-05 DIAGNOSIS — Z7409 Other reduced mobility: Secondary | ICD-10-CM | POA: Diagnosis not present

## 2023-09-05 DIAGNOSIS — M2550 Pain in unspecified joint: Secondary | ICD-10-CM | POA: Diagnosis not present

## 2023-09-05 NOTE — Progress Notes (Unsigned)
   Subjective:    Patient ID: Deanna Salinas, female    DOB: Mar 06, 1936, 88 y.o.   MRN: [id number]  HPI Virtual Visit via Video Note  I connected with Deanna Salinas on 09/05/23 at  3:30 PM EST by a video enabled telemedicine application and verified that I am speaking with the correct person using two identifiers.  Location: Patient: Home Provider: Office  Unfortunately family unable to connect via Internet because of poor connections where they live   I discussed the limitations of evaluation and management by telemedicine and the availability of in person appointments. The patient expressed understanding and agreed to proceed.  History of Present Illness:    Observations/Objective:   Assessment and Plan:   Follow Up Instructions:    I discussed the assessment and treatment plan with the patient. The patient was provided an opportunity to ask questions and all were answered. The patient agreed with the plan and demonstrated an understanding of the instructions.   The patient was advised to call back or seek an in-person evaluation if the symptoms worsen or if the condition fails to improve as anticipated.  I provided 15-20 minutes of non-face-to-face time during this encounter.   Lilyan Punt, MD  This patient As she has gotten older she is becoming more debilitated She has significant osteoarthritis in her knees hips as well as chronic low back pain She finds it very difficult to ambulate because of her obesity as well as her osteoarthritis She sees orthopedics every 5 to 6 months and they prescribed tramadol to her It helped some but not a lot She is not having any neuropathy issues not having sciatica. Patient also has had ongoing chronic long-term insomnia She had some unhealthy habits to try to compensate for this She relates that she is trying to do better She has stayed away from alcohol  We have tried trazodone, melatonin, over-the-counter sleep aids None  of these have helped In the past patient has tried benzodiazepines without help.  She states even with the medicine she only sleeps at 2 to 4 hours   Review of Systems     Objective:   Physical Exam  Today's visit was via telephone Physical exam was not possible for this visit       Assessment & Plan:  1. Impaired mobility (Primary) Patient is debilitated Mainly because of her orthopedic pain Currently taking tramadol They are requesting that we take over her tramadol prescribing because it is too difficult for her to get back and forth to [facility] We will touch base with her orthopedic PA  2. Insomnia, unspecified type Currently takes temazepam at night This is less than ideal but unfortunately there is no great answers for her situation. Previously I had discussed her case with the gerontologist in a informal way who stated that some cases medication such as this are necessary.  3. Arthralgia, unspecified joint Significant osteoarthritis of the low back with degenerative condition as well as her knees May continue tramadol  Long-term prognosis is subpar They are interested with palliative care We will discuss with her home health organization [phone]

## 2023-10-24 DIAGNOSIS — I10 Essential (primary) hypertension: Secondary | ICD-10-CM | POA: Diagnosis not present

## 2023-10-24 DIAGNOSIS — M25551 Pain in right hip: Secondary | ICD-10-CM | POA: Diagnosis not present

## 2023-10-24 DIAGNOSIS — M1990 Unspecified osteoarthritis, unspecified site: Secondary | ICD-10-CM | POA: Diagnosis not present

## 2023-10-24 DIAGNOSIS — M25552 Pain in left hip: Secondary | ICD-10-CM | POA: Diagnosis not present

## 2023-10-26 DIAGNOSIS — M1611 Unilateral primary osteoarthritis, right hip: Secondary | ICD-10-CM | POA: Diagnosis not present

## 2023-10-26 DIAGNOSIS — M1712 Unilateral primary osteoarthritis, left knee: Secondary | ICD-10-CM | POA: Diagnosis not present

## 2023-10-26 DIAGNOSIS — M5416 Radiculopathy, lumbar region: Secondary | ICD-10-CM | POA: Diagnosis not present

## 2023-10-26 DIAGNOSIS — M1711 Unilateral primary osteoarthritis, right knee: Secondary | ICD-10-CM | POA: Diagnosis not present

## 2023-10-26 DIAGNOSIS — Z5181 Encounter for therapeutic drug level monitoring: Secondary | ICD-10-CM | POA: Diagnosis not present

## 2023-10-26 DIAGNOSIS — M25511 Pain in right shoulder: Secondary | ICD-10-CM | POA: Diagnosis not present

## 2023-10-27 DIAGNOSIS — R5383 Other fatigue: Secondary | ICD-10-CM | POA: Diagnosis not present

## 2023-11-05 ENCOUNTER — Encounter: Payer: Self-pay | Admitting: Family Medicine

## 2023-11-07 NOTE — Telephone Encounter (Signed)
 Nurses Please talk with patient.  Update her medication list.  She also needs to schedule follow-up visit same-day slot with myself somewhere within the next 7 to 10 days.  Deanna Salinas or Toni Amend are reasonable options of my schedule completely full

## 2023-11-11 ENCOUNTER — Telehealth: Admitting: Family Medicine

## 2023-11-11 DIAGNOSIS — G4700 Insomnia, unspecified: Secondary | ICD-10-CM

## 2023-11-11 NOTE — Progress Notes (Signed)
   Subjective:    Patient ID: Deanna Salinas, female    DOB: 07/24/1936, 88 y.o.   MRN: [id number]  HPI Please see mychart message prior to video visit Per husband To consolidate prescriptions -from [facility]  Needs referral to hh agency to help with ADL's  Discussed the use of AI scribe software for clinical note transcription with the patient, who gave verbal consent to proceed.  History of Present Illness   Deanna Salinas is an 88 year old female who presents with improved pain and mobility. She is accompanied by Elliot Gurney, who assists with communication due to her hearing difficulties.  She has experienced an improvement in pain and mobility, describing her pain as 'lightening up' and her ability to move as better. She feels more active and engaged in life, although she may still require pain medication occasionally.  Her mood has improved, and she feels less depressed, attributing this positive change to spending time with her great-grandchildren, which she finds uplifting. She continues to take her medication as needed and finds joy in her family connections.  She recently traveled to [place] to visit her great-grandchildren, which includes two great-granddaughters, one great-grandson, and two more great-granddaughters, with another expected in August. She keeps track of her family using a yellow notepad and finds joy in these connections.       Review of Systems     Objective:   Physical Exam Telephone visit       Assessment & Plan:  Telephone visit Unable to totally handle the patient's problem They were unable to do a video visit Patient has significant hearing loss and was unable to communicate well via phone We set her up for next week for a office visit

## 2023-11-14 ENCOUNTER — Telehealth: Admitting: Physician Assistant

## 2023-11-15 ENCOUNTER — Ambulatory Visit (INDEPENDENT_AMBULATORY_CARE_PROVIDER_SITE_OTHER): Admitting: Family Medicine

## 2023-11-15 ENCOUNTER — Encounter: Payer: Self-pay | Admitting: Family Medicine

## 2023-11-15 VITALS — BP 111/60 | HR 70 | Temp 97.7°F | Ht 67.0 in

## 2023-11-15 DIAGNOSIS — G4700 Insomnia, unspecified: Secondary | ICD-10-CM | POA: Diagnosis not present

## 2023-11-15 DIAGNOSIS — M2550 Pain in unspecified joint: Secondary | ICD-10-CM

## 2023-11-15 DIAGNOSIS — M48062 Spinal stenosis, lumbar region with neurogenic claudication: Secondary | ICD-10-CM

## 2023-11-15 DIAGNOSIS — Z7409 Other reduced mobility: Secondary | ICD-10-CM

## 2023-11-15 MED ORDER — PREGABALIN 50 MG PO CAPS: 50.0000 mg | ORAL_CAPSULE | Freq: Two times a day (BID) | ORAL | 5 refills | Status: DC

## 2023-11-15 MED ORDER — CELECOXIB 100 MG PO CAPS: 100.0000 mg | ORAL_CAPSULE | Freq: Every day | ORAL | 1 refills | Status: DC

## 2023-11-15 MED ORDER — AMLODIPINE BESYLATE 2.5 MG PO TABS: 2.5000 mg | ORAL_TABLET | Freq: Every day | ORAL | 1 refills | Status: DC

## 2023-11-15 MED ORDER — TRAMADOL HCL 50 MG PO TABS: ORAL_TABLET | ORAL | 5 refills | Status: DC

## 2023-11-15 MED ORDER — TRAMADOL HCL ER 100 MG PO TB24: 100.0000 mg | ORAL_TABLET | Freq: Every day | ORAL | 5 refills | Status: DC

## 2023-11-15 MED ORDER — METOPROLOL TARTRATE 25 MG PO TABS: 25.0000 mg | ORAL_TABLET | Freq: Two times a day (BID) | ORAL | 1 refills | Status: DC

## 2023-11-15 MED ORDER — TEMAZEPAM 15 MG PO CAPS: 15.0000 mg | ORAL_CAPSULE | Freq: Every evening | ORAL | 0 refills | Status: DC | PRN

## 2023-11-15 NOTE — Progress Notes (Signed)
 Subjective:    Patient ID: Deanna Salinas, female    DOB: [birth year], 88 y.o.   MRN: [id number]  HPI Follow up medication review  I went through all of her medicines She spent multiple weeks in [place]  with her family While she was there she saw pain management and primary care They did add some the medicines Medicines were reviewed today  The patient is extremely nice.  She desires to live closer to her family Her husband would like to stay where they are at More than likely the patient states that she will live back and forth between [place]  and here.  We did discuss that while she is in [place]  she can follow through with doctors there as long as when she follows up this way she keeps us  informed of any changes they may have done  We also spent time discussing the complexities of pain management along with insomnia Previously she has had severe trouble with insomnia We have tried various measures including melatonin, behavioral techniques, Restoril , trazodone  We have tried to stay away from antipsychotics because of increased risk of death We did not want to be alone temazepam  but patient was literally exceptionally worked up regarding the fact that she could not sleep to the point that she stated she would drink alcohol to go to sleep.  This was not a good option.  She has difficulty hearing and despite my projection of my voice the patient has a hard time understanding what I am saying.  I had to write a significant portion down for her to read.  Discussed the use of AI scribe software for clinical note transcription with the patient, who gave verbal consent to proceed.  History of Present Illness   The patient presents with chronic pain management and sleep issues. She is accompanied by a family member, possibly her daughter-in-law.  She experiences chronic pain, particularly bone aches, with significant discomfort in the hip. She has been using tramadol  for pain management,  which she finds effective. She has sought pain management consultations in [place]  due to discomfort with the local office in [place].  She has sleep disturbances, particularly between midnight and 9 AM. She uses temazepam  as a sleep aid but acknowledges safety concerns when combined with her pain medication. Melatonin has been ineffective. She reports improved sleep since returning home, though sometimes feels she sleeps excessively.  She is currently taking tramadol  for pain and temazepam  for sleep, which she takes at night. She also uses CBD, though it is not very effective. She maintains a list of her medications to manage her regimen.  She has a history of traveling to [place]  to visit family, including her great-grandchildren, and plans to spend more time there, especially during colder months. She mentions stress related to family dynamics and the pressure to move closer to family, which she is resistant to as she enjoys her current home and lifestyle.      Review of Systems     Objective:   Physical Exam General-in no acute distress Eyes-no discharge Lungs-respiratory rate normal, CTA CV-no murmurs,RRR Extremities skin warm dry no edema Neuro grossly normal Behavior normal, alert        Assessment & Plan:   Assessment and Plan    Chronic Pain Chronic bone and hip pain managed with tramadol . Tramadol  and temazepam  combination poses respiratory risk. - Continue tramadol  for pain management, monitor for safety.  Chronic Insomnia Chronic insomnia managed with temazepam . Tramadol  and temazepam  combination poses respiratory  risk. Melatonin ineffective. - Taper temazepam  to 15 mg for two weeks, then discontinue. - Explore alternative sleep aids post-temazepam .  Goals of Care She wishes to remain in her current home, prioritizing independence over family proximity.  Follow-up Follow-up in three months to evaluate sleep management and health. - Schedule follow-up in  three months. - Encourage bi-weekly progress updates via messages.     So the biggest challenges noted being when we taper off temazepam  finding a different medicine I tried to impress upon her that nothing is perfect I am also impressed with her that the current approach toward with Sylvia's behavioral techniques Patient becomes very tearful and upset will we talk about this topic Will try to reach out to a couple different specialist to see if they may have some tips Previously well over a year ago but I reached out to geriatrics they had very little to say beyond what we have already tried.  As for her chronic pain tramadol  seems to help and based upon that her current situation that would be the safest approach I do not feel comfortable going toward hydrocodone  or oxycodone   Transportation has become an issue for her.  She does not want to go back and forth to [facility] to pain management.  I did communicate the sleep issue with psychiatry unfortunately there is not many great choices.

## 2023-11-24 ENCOUNTER — Other Ambulatory Visit: Payer: Self-pay | Admitting: Family Medicine

## 2023-11-25 ENCOUNTER — Other Ambulatory Visit: Payer: Self-pay | Admitting: Family Medicine

## 2023-11-25 MED ORDER — TEMAZEPAM 15 MG PO CAPS: 15.0000 mg | ORAL_CAPSULE | Freq: Every evening | ORAL | 2 refills | Status: DC | PRN

## 2023-11-27 ENCOUNTER — Telehealth: Payer: Self-pay | Admitting: Family Medicine

## 2023-11-27 NOTE — Telephone Encounter (Signed)
 Nurses Please talk with the patient/it would also be wise to make sure the husband is on the phone because she has severe hearing loss  I did send in a refill on her temazepam  15 mg but long-term we cannot use this because it does interfere with the tramadol  and increases her risk of abnormal side effects Therefore we will need to try different medication Please inquire with them if they utilize MyChart? I can send them notification regarding the newer medicine we are going to try I am also willing to do a follow-up office visit in person in the next few weeks if they would prefer to do it that way Please talk with them this week then send me a response what they would like to do regarding a follow-up regarding this Choices Choice #1-phone visit #2-in person visit #3-send MyChart communication

## 2023-12-06 ENCOUNTER — Encounter: Payer: Self-pay | Admitting: Family Medicine

## 2023-12-08 ENCOUNTER — Other Ambulatory Visit: Payer: Self-pay | Admitting: Family Medicine

## 2023-12-08 NOTE — Telephone Encounter (Signed)
 Appointment scheduled.

## 2023-12-08 NOTE — Telephone Encounter (Signed)
 Nurses Please add her to the schedule tomorrow I can do a phone visit at the end of the day It would be necessary for her husband to be part of that because she has a difficult time hearing I did call [facility] they have refills of her medicine She received her 100 mg on April 4 so she is not eligible to get it refilled until May 3 but patient is now saying that she is out?  Needless to say when I have a phone conversation with them tomorrow they need to give us  an update regarding what they have on hand.  I will also be discussing other options for sleep medicine with them at that time. I would recommend 4:10 PM.  If they would rather do this in person they can do a visit in person-their choice

## 2023-12-08 NOTE — Progress Notes (Signed)
 Yes hi this,

## 2023-12-09 ENCOUNTER — Telehealth: Admitting: Family Medicine

## 2023-12-09 DIAGNOSIS — G4700 Insomnia, unspecified: Secondary | ICD-10-CM

## 2023-12-09 DIAGNOSIS — M2550 Pain in unspecified joint: Secondary | ICD-10-CM

## 2023-12-09 NOTE — Progress Notes (Unsigned)
   Subjective:    Patient ID: Deanna Salinas, female    DOB: [birth year], 88 y.o.   MRN: [id number]  HPI .  Difficult situation Tried to do video visit but there connectivity was poor Ended up doing a phone visit Patient having trouble with insomnia does not feel that the temazepam  15 mg at night is doing well enough Originally temazepam  was prescribed by us  But then she started seeing another pain management doctor who started her on tramadol  Now she is coming to us  to have all of this prescribed by us  We are more than willing to prescribe the tramadol  But cannot do tramadol  and temazepam  because of increased risk of respiratory depression Patient understands this So therefore we will need to try something different In the past she has tried trazodone  it did not help We will look into other measures   Review of Systems     Objective:   Physical Exam  Telephone visit      Assessment & Plan:  Insomnia-we will look into other measures perhaps trying something different to help her sleep This is a very challenging issue Patient is very nice and is quite expressive about very important for her to sleep which we agree but at the same time benzodiazepines or hypnotics are not a good idea with her being on tramadol   We will send in additional medicine on Monday  I reviewed over several information's and unfortunately multiple choices for medications to help with sleep interact in a negative way with tramadol .  Therefore we will try hydroxyzine  at least for the next 2 weeks.  Unfortunately there is nothing that is going to totally help this situation without causing problems with her other medicine.  Patient is very adamant she can take both medicines but we are held to a higher standard than just purely based on what the patient desires

## 2023-12-09 NOTE — Telephone Encounter (Signed)
 Patient has video visit today with Dr Geralyn Knee to discuss

## 2023-12-12 ENCOUNTER — Telehealth: Payer: Self-pay | Admitting: *Deleted

## 2023-12-12 ENCOUNTER — Other Ambulatory Visit: Payer: Self-pay | Admitting: Family Medicine

## 2023-12-12 MED ORDER — HYDROXYZINE PAMOATE 25 MG PO CAPS: ORAL_CAPSULE | ORAL | 3 refills | Status: DC

## 2023-12-12 NOTE — Telephone Encounter (Signed)
 We are trying to do the best we can to help her with her pain as well as help her with sleep Unfortunately there is no perfect solution Family is aware of this I would recommend hydroxyzine  25 mg may take 1 or 2 near bedtime to help promote sleep Avoid all caffeine's Stop temazepam  because it is not considered safe with tramadol  I would like for the patient to give this current medication trial for at least 10 to 14 days before having to try anything else thank you  Hydroxyzine  was sent to [facility] pharmacy early this afternoon thank you

## 2023-12-12 NOTE — Telephone Encounter (Signed)
 Copied from CRM [id number]. Topic: Clinical - Prescription Issue >> Dec 12, 2023 12:28 PM Sophia H wrote: Reason for CRM: Spoke with the patients husband who stated that his wife should have gotten a prescription for sleeping pills during the last visit w [name] sent over to [facility] [place], [place] - [id number] He spoke with the pharmacy and they do not have anything yet, needing this filled for his wife ASAP. Mr. Financial trader #  [phone]

## 2023-12-12 NOTE — Telephone Encounter (Signed)
 Pt's husband returned call and verbalized understanding of message from PCP.

## 2023-12-16 ENCOUNTER — Encounter: Payer: Self-pay | Admitting: Family Medicine

## 2023-12-23 ENCOUNTER — Encounter: Payer: Self-pay | Admitting: Family Medicine

## 2023-12-27 NOTE — Telephone Encounter (Signed)
 Nurses Certainly we want to try to help Deanna Salinas-and desire things to be better  But there are also safety issues Currently her tramadol  interacts with a large number of other medicines that can be used to help with sleep  Traditional sleeping pills such as hypnotics/benzodiazepine  such as temazepam  Ativan  Klonopin cannot be used with patients who are on pain medications such as hydrocodone  or tramadol  because of potential drug interactions that could cause serious outcomes  Other medicines that can be used to help with sleep such as trazodone , mirtazapine , doxepin-unfortunately interact in a significant way with tramadol .  Hydroxyzine  for now is the best that can be done 25 mg taking 2 of those each evening  I would recommend a in person visit somewhere within the next 2 weeks and one of my open slots-there is unfortunately no good solutions to her problem.  The vast majority of experts would say they can utilize medications to help with pain or they can utilize medications to help with sleep but the combination of the 2 is just not doable  I would recommend an in person discussion to discuss the limited options that there are Once again we truly desire to help but this is a difficult situation for which there is not a perfect solution-thanks-Dr. Costella Dirks may share this message with [name]

## 2023-12-27 NOTE — Telephone Encounter (Signed)
 Nurses-see previous message please try to schedule her an appointment with me somewhere within the next 2 weeks thank you

## 2024-01-02 ENCOUNTER — Telehealth: Payer: Self-pay | Admitting: Family Medicine

## 2024-01-02 NOTE — Telephone Encounter (Signed)
 It should be noted that we have been trying to work with the patient regarding 2 difficult issues #1 chronic pain and discomfort #2 chronic insomnia Previously we had had her try multiple different medications from melatonin to trazodone  to behavioral techniques to help with sleep none of that helped.  I have consulted electronically with the geriatrician who recommended various techniques when behavioral does not do well enough and the patient is not satisfied.  We tried Remeron .  We tried to avoid benzodiazepines.  Ended up utilizing temazepam .  Subsequently patient was seen by her physical medicine specialist who started her on tramadol  and then she was on larger doses of tramadol .  From our clinical point of view we did not feel that this was wise for her to be on tramadol  and temazepam  and recommended stopping temazepam .  Patient relates that she is not sleeping well.  We have tried Vistaril .  Unfortunately Remeron  doxepin Seroquel  and other medicines such as that would interact with tramadol .  This has been a difficult situation that there is not a perfect solution.  I offered to refer her to a sleep medicine doctor patient deferred.  Recently her husband informed me that she was going to be seeing a internal medicine doctor in [place]  who is a doctor that her family set her up with.  I am not opposed to the patient seeing a different doctor for their opinion and have stated though that medications prescribed by the other doctor will not necessarily be prescribed by us .  And also stated to the the husband that patient will need to decide what approach she wants to take and that it would be difficult to be back and forth in regards to primary care direction.  We will see what comes of all of this.  There is an open invitation to the patient to reschedule follow-up visit.

## 2024-01-05 ENCOUNTER — Ambulatory Visit: Admitting: Family Medicine

## 2024-01-05 VITALS — BP 138/86 | HR 81 | Temp 97.9°F | Ht 67.0 in

## 2024-01-05 DIAGNOSIS — G894 Chronic pain syndrome: Secondary | ICD-10-CM | POA: Diagnosis not present

## 2024-01-05 DIAGNOSIS — M791 Myalgia, unspecified site: Secondary | ICD-10-CM

## 2024-01-05 DIAGNOSIS — G4700 Insomnia, unspecified: Secondary | ICD-10-CM | POA: Diagnosis not present

## 2024-01-05 DIAGNOSIS — M2550 Pain in unspecified joint: Secondary | ICD-10-CM | POA: Diagnosis not present

## 2024-01-05 MED ORDER — PREGABALIN 50 MG PO CAPS: ORAL_CAPSULE | ORAL | 5 refills | Status: DC

## 2024-01-05 MED ORDER — TRAMADOL HCL ER 100 MG PO TB24: 100.0000 mg | ORAL_TABLET | Freq: Every day | ORAL | 5 refills | Status: DC

## 2024-01-05 MED ORDER — TRAMADOL HCL 50 MG PO TABS: ORAL_TABLET | ORAL | 5 refills | Status: DC

## 2024-01-05 NOTE — Progress Notes (Signed)
 Subjective:    Patient ID: Deanna Salinas, female    DOB: June 14, 1936, 88 y.o.   MRN: [id number]  HPI Pt comes in today to review and go over medications to decide which medications can be taken off and which medications can be switched. Pt is unable to weigh today. Medications and allergies reviewed.  Discussed the use of AI scribe software for clinical note transcription with the patient, who gave verbal consent to proceed.  History of Present Illness   Deanna Salinas is an 88 year old female who presents with generalized body pain and sleep disturbances.  She experiences generalized body pain that fluctuates from 0 to 7 on a pain scale within a 24-hour period. Initially, pain episodes lasted about four hours, but now she persists day and night without relief. The pain affects her joints first, followed by her muscles, and sometimes feels like her bones are hurting. She takes tramadol  100 mg once daily at 3 PM and tramadol  50 mg up to four times a day, which provides some relief but does not completely alleviate the pain. She is also on pregabalin  for pain management.  She has significant sleep disturbances, often not sleeping for two consecutive nights, leading to extreme fatigue. Despite taking a pain pill at 9 PM, followed by melatonin and a sleeping pill, she struggles to fall asleep. By the third night, she manages to sleep due to exhaustion. She previously tried temazepam  but found it ineffective compared to tramadol  for sleep.  She has experienced falls, including a recent incident where she 'melted' while standing at the sink, resulting in a fall and hitting her head. She attributes some falls to accidents but notes recent falls feel different, as if her legs give out. She engages in physical therapy exercises to maintain mobility but struggles with walking due to hip pain.  Her current medications include tramadol , pregabalin  (Lyrica ), and melatonin. She mentions using a coffee  powder supplement that exacerbated her pain, which she has since discontinued.  She has a history of being an extrovert and values social interaction, although she currently experiences limitations due to her health conditions.      Review of Systems     Objective:   Physical Exam General-in no acute distress Eyes-no discharge Lungs-respiratory rate normal, CTA CV-no murmurs,RRR Extremities skin warm dry no edema Neuro grossly normal Behavior normal, alert        Assessment & Plan:  Assessment and Plan    Chronic pain Chronic joint and muscle pain, partially relieved by tramadol  and pregabalin . Tramadol  aids sleep but poses interaction risks. - Continue tramadol  100 mg extended release once daily at 3 PM. - Continue tramadol  50 mg up to four times daily as needed. - Increase pregabalin  to one in the morning and two in the evening. - Order blood tests to assess kidney function and inflammatory markers.  Insomnia Chronic insomnia with ineffective current regimen. Tramadol  aids sleep but poses interaction risks. - Collaborate with clinical pharmacist to identify a safe sleep medication.  Frequent falls Falls reported, with limited mobility from hip issues. - Consider home-based physical therapy to assist with exercises and mobility.  Goals of Care She wishes to maintain independence and avoid becoming bedbound, preferring to stay in her current residence.      1. Arthralgia, unspecified joint (Primary) We will check lab work May continue tramadol  as prescribed I do not recommend switching over to hydrocodone  currently  - C-reactive protein - Sedimentation Rate - CK  2. Myalgia Check inflammatory markers.  See above - Basic Metabolic Panel - C-reactive protein - Sedimentation Rate - CK  3. Insomnia, unspecified type Currently using hydroxyzine  Will discuss with clinical pharmacist if there are other choices that could be utilized - Basic Metabolic Panel Her  insomnia is very difficult She states she does not sleep for 2 nights then will sleep well On further questioning it appears that she sleeps possibly a couple hours on those nights Because of her tramadol  it limits what medicines could be used with that  4. Chronic pain syndrome Tramadol  currently being used is helpful we will stick with that for now Patient not abusing it It takes the edge off her pain Without it her quality of life would be severely diminished  Follow-up in approximately 4 months

## 2024-01-05 NOTE — Patient Instructions (Signed)
 Please do your blood work  You may continue tramadol  100 mg extended release 1 at 3 PM The other tramadol  50 mg may be taken 1 every 6 hours up to 4/day  We will increase pregabalin  to help with your pain Take 1 in the morning and 2 in the evening  We will look into other options to help with rest but we are doing the best we can to find a medication compatible with what you are already on  We would like to see you back in 4 months  Please take care-Dr. Geralyn Knee

## 2024-01-06 ENCOUNTER — Ambulatory Visit: Payer: Self-pay | Admitting: Family Medicine

## 2024-01-06 LAB — BASIC METABOLIC PANEL WITH GFR
BUN/Creatinine Ratio: 27 (ref 12–28)
BUN: 24 mg/dL (ref 8–27)
CO2: 24 mmol/L (ref 20–29)
Calcium: 9.1 mg/dL (ref 8.7–10.3)
Chloride: 95 mmol/L — ABNORMAL LOW (ref 96–106)
Creatinine, Ser: 0.9 mg/dL (ref 0.57–1.00)
Glucose: 75 mg/dL (ref 70–99)
Potassium: 5.1 mmol/L (ref 3.5–5.2)
Sodium: 137 mmol/L (ref 134–144)
eGFR: 62 mL/min/{1.73_m2} (ref >59)

## 2024-01-06 LAB — C-REACTIVE PROTEIN: CRP: 6 mg/L (ref 0–10)

## 2024-01-06 LAB — SEDIMENTATION RATE: Sed Rate: 13 mm/h (ref 0–40)

## 2024-01-06 LAB — CK: Total CK: 29 U/L (ref 26–161)

## 2024-01-11 ENCOUNTER — Telehealth: Payer: Medicare Other | Admitting: Family Medicine

## 2024-01-18 ENCOUNTER — Other Ambulatory Visit: Payer: Self-pay | Admitting: Family Medicine

## 2024-01-18 NOTE — Telephone Encounter (Signed)
 Nurses Please communicate with Halla  I looked at our previous documentation from the end of May 2025.  I did send in tramadol  50 mg as well as tramadol  100 mg with 5 refills so another words [facility] has on file the refills.  She received tramadol  100 mg 1 daily on June 3 and therefore would be eligible to get this refilled again in 30 days from June 3.  She received tramadol  50 mg 1 taken 4 times daily, #120, on Jan 07, 2024, she would be eligible to receive this again 30 days from that date  I would encourage her to talk with the pharmacist at [facility] regarding what days they can fill the medicine.  Because these are controlled medicines each prescription is for 30 days but there are refills on file at the pharmacy.  As for follow-up she has a follow-up visit in August if she would like to be seen sooner than that please work towards setting that up later in June or early July depending on schedule same-day slot would be okay  Thanks-Dr. Geralyn Knee

## 2024-01-24 ENCOUNTER — Ambulatory Visit: Payer: Self-pay

## 2024-01-24 ENCOUNTER — Ambulatory Visit: Admitting: Family Medicine

## 2024-01-24 DIAGNOSIS — G4700 Insomnia, unspecified: Secondary | ICD-10-CM

## 2024-01-24 NOTE — Progress Notes (Signed)
 Family was unable to do video visit I was able to speak with the husband The wife was asleep the husband stated that it would be best for her to stay asleep because she has not been sleeping well lately Unfortunately has been very difficult for this patient she has a tough balancing act between keeping her pain under control and sleeping.  Previously we have tried sleep medicines with her including temazepam  but she is also had onset of severe pain for which her pain medicine doctor placed her on tramadol  because that could interact with the temazepam  we have discontinued temazepam  her sleep is hit or miss  Her quality of life is diminished because she is not sleeping.  We have tried trazodone , Vistaril , clonazepam.  We are trying to stay away from controlled medicines  Will touch base with sleep doctor to see if they think there is anything that can be offered Also will touch base with psychiatry Very difficult situation  Because this ended up being a phone visit no charge

## 2024-01-24 NOTE — Telephone Encounter (Signed)
 FYI Only or Action Required?: Action required by provider  Patient was last seen in primary care on 01/05/2024 by Deanna Brasil, MD. Called Nurse Triage reporting Pain. Symptoms began ongoing. Interventions attempted: Prescription medications: as prescribed. Symptoms are: Full body pain 10/10rapidly worsening.  Triage Disposition: Call EMS 911 Now  Patient/caregiver understands and will follow disposition?: No, refuses disposition Called [facility] to report refusal of ed/ems.  Deanna Salinas worked pt in at [employer] with Dr. Almeda Salinas pt to inform of work in - pt refuses 4pm appt with Dr. Fairy Salinas.  Called [facility]  - Dr. Fairy Salinas can do 4pm appt as VV.  Also advises 911/EMS   Called pt back - s/w husband. Pt will wait for call from Dr. Fairy Salinas this afternoon. Again refuses EMS.         Copied from CRM [id number]. Topic: Clinical - Red Word Triage >> Jan 24, 2024  9:13 AM Deanna Salinas wrote: Red Word that prompted transfer to Nurse Triage: Severe pain, sleeping pills not working. Full body arthritis pain. Reason for Disposition  Sounds like a life-threatening emergency to the triager  Answer Assessment - Initial Assessment Questions 1. ONSET: When did the muscle aches or body pains start?      Ongoing 2. LOCATION: What part of your body is hurting? (e.g., entire body, arms, legs)      everywhere 3. SEVERITY: How bad is the pain? (Scale 1-10; or mild, moderate, severe)   - MILD (1-3): doesn't interfere with normal activities    - MODERATE (4-7): interferes with normal activities or awakens from sleep    - SEVERE (8-10):  excruciating pain, unable to do any normal activities      10/10 - crying in pain 4. CAUSE: What do you think is causing the pains?     Arthritis  Protocols used: Muscle Aches and Body Pain-A-AH

## 2024-01-24 NOTE — Telephone Encounter (Signed)
 Sympathetic to the patient's pain, we offered an office visit, patient stated she could not come because of her pain, unfortunately there may not be a perfect solution to her issue.  We will do a phone visit per patient request later today

## 2024-02-14 ENCOUNTER — Telehealth (INDEPENDENT_AMBULATORY_CARE_PROVIDER_SITE_OTHER): Admitting: Physician Assistant

## 2024-02-14 ENCOUNTER — Encounter: Payer: Self-pay | Admitting: Physician Assistant

## 2024-02-14 ENCOUNTER — Ambulatory Visit: Payer: Self-pay

## 2024-02-14 NOTE — Progress Notes (Signed)
 Virtual Visit via Video Note  Location: Patient: home Provider: office   History of Present Illness: Patient was contacted by MA for intake. MA spoke to husband who had concerns for dislocated shoulder due to use of lifts and ropes/pulleys at home as patient is reportedly bed bound. Husband is requesting professional opinion on plan of action for shoulder pain and concerns of dislocation.   Assessment and Plan: This provider made 2 attempts to contact patient regarding appointment and concerns for shoulder pain. Voicemail left to return call. Was not able to connect with this patient today.    Follow Up Instructions: Advise ER or urgent care visit for further evaluation of concerns for dislocated shoulder.     The patient was advised to call back or seek an in-person evaluation if the symptoms worsen or if the condition fails to improve as anticipated.  I provided 0 minutes of non-face-to-face time during this encounter. No charge encounter.    Charmaine Jabir Dahlem, PA-C

## 2024-02-14 NOTE — Telephone Encounter (Signed)
 FYI Only or Action Required?: FYI only for provider.  Patient was last seen in primary care on 01/24/2024 by [name], MD.  Called Nurse Triage reporting Shoulder Injury.  Symptoms began 3 days ago.  Interventions attempted: Prescription medications: prescription pain medications and Lidoderm  patches and Other: sling.  Symptoms are: gradually worsening.  Triage Disposition: See Physician Within 24 Hours  Patient/caregiver understands and will follow disposition?: Yes          Copied from CRM [id number]. Topic: Clinical - Red Word Triage >> Feb 14, 2024  9:13 AM Zenovia PARAS wrote: Red Word that prompted transfer to Nurse Triage: Spouse Gwenn states that patient dislocated right shoulder and having lots of pain Reason for Disposition  Can't move injured shoulder normally (e.g., full range of motion, able to touch top of head)  Answer Assessment - Initial Assessment Questions 1. MECHANISM: How did the injury happen?     Was pulling herself up in bed with pulleys 2. ONSET: When did the injury happen? (Minutes or hours ago)      3 days ago 3. APPEARANCE of INJURY: What does the injury look like?      Looks normal 4. SEVERITY: Can you move the shoulder normally?      yes 5. SIZE: For cuts, bruises, or swelling, ask: How large is it? (e.g., inches or centimeters;  entire joint)      no 6. PAIN: Is there pain? If Yes, ask: How bad is the pain?   (e.g., Scale 1-10; or mild, moderate, severe)   - MILD (1-3): doesn't interfere with normal activities   - MODERATE (4-7): interferes with normal activities (e.g., work or school) or awakens from sleep   - SEVERE (8-10): excruciating pain, unable to do any normal activities, unable to move arm at all due to pain     5/10 8. OTHER SYMPTOMS: Do you have any other symptoms? (e.g., loss of sensation)     no  Protocols used: Shoulder Injury-A-AH

## 2024-02-17 ENCOUNTER — Ambulatory Visit: Admitting: Family Medicine

## 2024-02-20 ENCOUNTER — Ambulatory Visit: Payer: Self-pay

## 2024-02-20 NOTE — Telephone Encounter (Signed)
 FYI Only or Action Required?: Action required by provider: husband states he needs assistance at home and they are willing to pay for it.  Would like some suggestions or home health care.  Patient was last seen in primary care on 01/24/2024 by [name], MD.  Called Nurse Triage reporting Shoulder Pain.  Symptoms began a week ago.  Interventions attempted: Nothing.  Symptoms are: gradually worsening.  Triage Disposition: No disposition on file.  Patient/caregiver understands and will follow disposition?:    Copied from CRM [id number]. Topic: Clinical - Red Word Triage >> Feb 20, 2024  3:25 PM Tiffini S wrote: [facility] that prompted transfer to Nurse Triage: Spouse Gwenn states that patient dislocated right shoulder and having lots of pain- asked to talk with nurse. Transferred to triage nurse. Reason for Disposition  [1] Shoulder pains with exertion (e.g., walking) AND [2] pain goes away on resting AND [3] not present now  Answer Assessment - Initial Assessment Questions 1. ONSET: When did the pain start?     She is bedridden and uses shoulders to push herself up happened a week ago.  States can't move it 2. LOCATION: Where is the pain located?     Right shoulder 3. PAIN: How bad is the pain? (Scale 1-10; or mild, moderate, severe)     severe 4. WORK OR EXERCISE: Has there been any recent work or exercise that involved this part of the body?     Uses shoulders to assist with sitting up 5. CAUSE: What do you think is causing the shoulder pain?     dislocation 6. OTHER SYMPTOMS: Do you have any other symptoms? (e.g., neck pain, swelling, rash, fever, numbness, weakness)     na 7. PREGNANCY: Is there any chance you are pregnant? When was your last menstrual period?     na  Protocols used: Shoulder Pain-A-AH

## 2024-02-21 ENCOUNTER — Emergency Department (HOSPITAL_COMMUNITY)
Admission: EM | Admit: 2024-02-21 | Discharge: 2024-02-21 | Disposition: A | Discharge status: Home / Self Care | Attending: Emergency Medicine | Admitting: Emergency Medicine

## 2024-02-21 ENCOUNTER — Encounter (HOSPITAL_COMMUNITY): Payer: Self-pay

## 2024-02-21 ENCOUNTER — Other Ambulatory Visit: Payer: Self-pay

## 2024-02-21 ENCOUNTER — Emergency Department (HOSPITAL_COMMUNITY)

## 2024-02-21 DIAGNOSIS — M25511 Pain in right shoulder: Secondary | ICD-10-CM | POA: Insufficient documentation

## 2024-02-21 DIAGNOSIS — M19011 Primary osteoarthritis, right shoulder: Secondary | ICD-10-CM | POA: Diagnosis not present

## 2024-02-21 NOTE — ED Triage Notes (Signed)
 Pt arrived via POV with family who provide the information provided in Triage. Pt does not have her hearing aides  and Pt is deaf. Per family, Pt has been in pain for 1 week. Per family, Pt pulls herself out of the shower and possibly dislocated her shoulder.

## 2024-02-21 NOTE — ED Notes (Signed)
 While monitoring Pts V/S on Dinamap in Triage, it was observed Pts HR dropped into the 30's. Unsure if readings are accurate at this time. Pt presents in a lot of pain and discomfort and has a hard time staying still in wheel chair. Pt required assistance exiting the vehicle in the parking lot, and presents in an arm sling, family report they purchased from [place].

## 2024-02-21 NOTE — Telephone Encounter (Signed)
 Patient was seen in the ER, x-ray did not show dislocation, was referred to orthopedics Patient already has follow-up visit in August with us 

## 2024-02-21 NOTE — ED Provider Notes (Signed)
 [place] EMERGENCY DEPARTMENT AT [facility] Provider Note   CSN: [id number] Arrival date & time: 02/21/24  9049     Patient presents with: Shoulder Pain (Right shoulder)   Deanna Salinas is a 88 y.o. female.   HPI 88 year old female presents with atraumatic right shoulder pain.  Pain has been ongoing for about 1 week.  No specific cause.  She has not had a fall for over 2 months.  There is no specific movement that induces pain but sometimes it feels like a spasms.  Spasming is more at her proximal upper arm.  Sometimes she will move it and it feels like she moves it the wrong way and it will hurt.  No weakness or numbness associated to her arm.  She denies chest pain, fever, shortness of breath.  Prior to Admission medications   Medication Sig Start Date End Date Taking? Authorizing Provider  amLODipine  (NORVASC ) 2.5 MG tablet Take 1 tablet (2.5 mg total) by mouth daily. 11/15/23  Yes [name], MD  celecoxib  (CELEBREX ) 100 MG capsule Take 1 capsule (100 mg total) by mouth daily. 11/15/23  Yes [name], MD  hydrOXYzine  (VISTARIL ) 25 MG capsule Take 1-2 capsule by mouth each evening to help with sleep 12/12/23  Yes Luking, Glendia LABOR, MD  Melatonin 10 MG CAPS Take by mouth.   Yes [provider]  metoprolol  tartrate (LOPRESSOR ) 25 MG tablet Take 1 tablet (25 mg total) by mouth 2 (two) times daily. 11/15/23  Yes [name], MD  Multiple Vitamin (MULTI-VITAMIN DAILY PO) Take 1 tablet by mouth daily.   Yes [provider]  OVER THE COUNTER MEDICATION CBD gummies   Yes [provider]  pregabalin  (LYRICA ) 50 MG capsule 1 in the morning and 2 in the evening 01/05/24  Yes Luking, Glendia LABOR, MD  traMADol  (ULTRAM ) 50 MG tablet One qid 01/05/24  Yes Luking, Glendia LABOR, MD  traMADol  (ULTRAM -ER) 100 MG 24 hr tablet Take 1 tablet (100 mg total) by mouth daily. Patient taking differently: Take 100 mg by mouth daily. 3pm 01/05/24  Yes [name], MD   Accu-Chek Softclix Lancets lancets E11.9 USE TO CHECK GLUCOSE ONCE DAILY 04/28/22   [name] A, MD  glucose blood (ACCU-CHEK GUIDE) test strip E11.9 USE 1 STRIP TO CHECK GLUCOSE ONCE DAILY 04/28/22   [name], MD    Allergies: Patient has no active allergies.    Review of Systems  Respiratory:  Negative for shortness of breath.   Cardiovascular:  Negative for chest pain.  Musculoskeletal:  Positive for arthralgias.    Updated Vital Signs BP (!) 158/74   Pulse 78   Temp 98 F (36.7 C) (Oral)   Resp 20   Ht 5' 7 (1.702 m)   Wt 86.2 kg   SpO2 94%   BMI 29.76 kg/m   Physical Exam Vitals and nursing note reviewed.  Constitutional:      Appearance: She is well-developed.  HENT:     Head: Normocephalic and atraumatic.  Cardiovascular:     Rate and Rhythm: Normal rate and regular rhythm.     Pulses:          Radial pulses are 2+ on the right side.  Pulmonary:     Effort: Pulmonary effort is normal.  Musculoskeletal:     Right shoulder: Tenderness present. No swelling, deformity or effusion. Normal range of motion.     Right upper arm: No tenderness.  Comments: Normal grip strength in right hand. Grossly  normal sensation.  Skin:    General: Skin is warm and dry.  Neurological:     Mental Status: She is alert.     (all labs ordered are listed, but only abnormal results are displayed) Labs Reviewed - No data to display  EKG: None  Radiology: DG Shoulder Right Result Date: 02/21/2024 CLINICAL DATA:  Shoulder pain for 1 week.  Possible dislocation. EXAM: RIGHT SHOULDER - 2+ VIEW COMPARISON:  Chest radiographs 09/20/2022. FINDINGS: AP and Y-views are submitted. There is no evidence of acute fracture or dislocation. Advanced glenohumeral degenerative changes are similar to previous chest radiographs. There are mild acromioclavicular degenerative changes without significant narrowing of the subacromial space. No focal soft tissue abnormalities are seen.  IMPRESSION: No evidence of acute fracture or dislocation. Advanced glenohumeral degenerative changes. Electronically Signed   By: Elsie Perone M.D.   On: 02/21/2024 10:51     Procedures   Medications Ordered in the ED - No data to display                                  Medical Decision Making Amount and/or Complexity of Data Reviewed Radiology: ordered and independent interpretation performed.    Details: No fracture or dislocation   Patient presents with about 1 week of shoulder pain.  Seems to flare randomly.  No chest pain, shortness of breath, or other anginal concern.  Does seem like at times it will spasm or a certain movement will induce the pain.  She took a tramadol  prior to arrival and feels somewhat better.  Intact radial pulse and normal grip strength and sensation in her hand.  At this point the pain seems to be controlled.  I cautioned her about not using a sling which she has started using because of concern for developing a frozen shoulder.  She would like referral to Dr. Josefina which has been provided.  Otherwise, will discharge home with return precautions.  Very low suspicion for a septic joint.     Final diagnoses:  Acute pain of right shoulder    ED Discharge Orders          Ordered    Home Health        02/21/24 1155    Face-to-face encounter (required for Medicare/Medicaid patients)       Comments: I Glendia ONEIDA Breeding certify that this patient is under my care and that I, or a nurse practitioner or physician's assistant working with me, had a face-to-face encounter that meets the physician face-to-face encounter requirements with this patient on 02/21/2024. The encounter with the patient was in whole, or in part for the following medical condition(s) which is the primary reason for home health care (List medical condition): arthritis   02/21/24 1155               Breeding Glendia, MD 02/21/24 1215

## 2024-02-21 NOTE — Discharge Instructions (Addendum)
 Follow-up with Dr. Josefina.  Be sure to not keep your shoulder in a sling as this can lead to stiffness and what is called a frozen shoulder.  Be sure to gently move your shoulder and range of motion exercises.  If you develop new or worsening or intractable pain, inability to move your arm, weakness or numbness in your arm, fever, or any other new/concerning symptoms then return to the ER.

## 2024-02-27 DIAGNOSIS — M19011 Primary osteoarthritis, right shoulder: Secondary | ICD-10-CM | POA: Diagnosis not present

## 2024-03-14 ENCOUNTER — Ambulatory Visit: Admitting: Family Medicine

## 2024-03-14 ENCOUNTER — Encounter: Payer: Self-pay | Admitting: Family Medicine

## 2024-03-14 VITALS — BP 168/87 | HR 85 | Temp 97.7°F | Ht 67.0 in

## 2024-03-14 DIAGNOSIS — G894 Chronic pain syndrome: Secondary | ICD-10-CM

## 2024-03-14 NOTE — Progress Notes (Unsigned)
   Subjective:    Patient ID: Deanna Salinas, female    DOB: 07/09/1936, 88 y.o.   MRN: [id number]  HPI Discuss sleep problems not getting 3 to 4 hrs per night Sleep problem not allowing her to function at all during the day Pain this past Monday taking both tramadol , cbd, aleve  when pain keeps her up  Fall in early July no injury obtained Difficulty with arthritis worsening  Stating regular BP meds could be causing itch and difficult sleep- unsure which one  Review of Systems     Objective:   Physical Exam        Assessment & Plan:   Patient left without being seen We were running 25 minutes behind she was the next patient  Despite reassurance from the nurse she states she could not stay because of pain  We will try to reschedule

## 2024-03-15 ENCOUNTER — Ambulatory Visit: Payer: Self-pay

## 2024-03-15 NOTE — Telephone Encounter (Signed)
 FYI Only or Action Required?: FYI only for provider.  Patient was last seen in primary care on 01/24/2024 by [name], MD.  Called Nurse Triage reporting Advice Only.   Copied from CRM #[id number]. Topic: Clinical - Red Word Triage >> Mar 15, 2024  8:04 AM Donna BRAVO wrote: Red Word that prompted transfer to Nurse Triage: patient husband Rosendo calling, left appt provider had an emergency. Patient could not sit in the office anymore due to severe pain, pain in hips and knees, slept 3 hrs last night . Patient is on new medication that is not help with sleeping. Reason for Disposition  General information question, no triage required and triager able to answer question    Routing to PCP  Answer Assessment - Initial Assessment Questions 1. REASON FOR CALL: What is the main reason for your call? or How can I best help you?  Patient's husband called and stated all he wants us  to do is send a note to Dr. Alphonsa to apologize for having to leave the other day when his wife was scheduled for her appointment.  Husband stated wife was in pain that day and the pain became worse while waiting.  Husband also stated wife had not slept well the night before either.  Husband again wanting to send apology. Nurse offered to reschedule appointment: husband stating he did not know what they are going to do from this point.  Protocols used: Information Only Call - No Triage-A-AH

## 2024-03-16 ENCOUNTER — Telehealth: Payer: Self-pay

## 2024-03-16 NOTE — Telephone Encounter (Signed)
 I am okay with her being a 1120 appointment with myself please-please alert patient we tried to stay on time but cannot guarantee we will be specifically on time so hopefully we will be able to see her (she left last time because we were running behind and she was in pain)

## 2024-03-16 NOTE — Telephone Encounter (Signed)
 Can this patient be scheduled for same day visit?  Copied from CRM #[id number]. Topic: Appointments - Scheduling Inquiry for Clinic >> Mar 16, 2024  1:43 PM Everette C wrote: Patient would like to be contacted by a member of staff when possible to discuss being scheduled for an office visit this week 8/11-8/15 with their PCP  Please contact the patient further if/when possible

## 2024-03-16 NOTE — Progress Notes (Signed)
 Tried to call and no one answered I called her spouse since she can not hear the phone. There is no voicemail on his phone I will try to reach back out again.

## 2024-03-20 ENCOUNTER — Ambulatory Visit (INDEPENDENT_AMBULATORY_CARE_PROVIDER_SITE_OTHER): Admitting: Family Medicine

## 2024-03-20 ENCOUNTER — Encounter: Payer: Self-pay | Admitting: Family Medicine

## 2024-03-20 VITALS — BP 128/72 | HR 86 | Temp 97.7°F | Ht 67.0 in

## 2024-03-20 DIAGNOSIS — E785 Hyperlipidemia, unspecified: Secondary | ICD-10-CM

## 2024-03-20 DIAGNOSIS — G4700 Insomnia, unspecified: Secondary | ICD-10-CM

## 2024-03-20 DIAGNOSIS — M791 Myalgia, unspecified site: Secondary | ICD-10-CM

## 2024-03-20 DIAGNOSIS — Z79899 Other long term (current) drug therapy: Secondary | ICD-10-CM | POA: Diagnosis not present

## 2024-03-20 DIAGNOSIS — E1169 Type 2 diabetes mellitus with other specified complication: Secondary | ICD-10-CM | POA: Diagnosis not present

## 2024-03-20 DIAGNOSIS — G894 Chronic pain syndrome: Secondary | ICD-10-CM

## 2024-03-20 DIAGNOSIS — M2550 Pain in unspecified joint: Secondary | ICD-10-CM | POA: Diagnosis not present

## 2024-03-20 DIAGNOSIS — M48062 Spinal stenosis, lumbar region with neurogenic claudication: Secondary | ICD-10-CM | POA: Diagnosis not present

## 2024-03-20 DIAGNOSIS — E119 Type 2 diabetes mellitus without complications: Secondary | ICD-10-CM

## 2024-03-20 NOTE — Progress Notes (Signed)
 Subjective:    Patient ID: Deanna Salinas, female    DOB: 1936/07/08, 88 y.o.   MRN: [id number]  HPI  Chronic pain syndrome This patient was seen today for chronic pain  The medication list was reviewed and updated.   Location of Pain for which the patient has been treated with regarding narcotics: She relates pain in all her major joints worse in the hips the back and the knees patient states that it is very painful to move and walk she is very limited in what she can do she is pretty much sits in a wheelchair or recliner or in her bed.  Does minimal activity  Onset of this pain: Been present for years getting worse over time   -Compliance with medication: Good compliance  - Number patient states they take daily: She does 100 mg extended release once a day She does 50 mg of tramadol  up to 4 times a day   -Reason for ongoing use of opioids cannot get adequate relief Tylenol  and encouraged her to stay away from NSAIDs because of the risk of ulcers  What other measures have been tried outside of opioids Tylenol , hydrocodone   In the ongoing specialists regarding this condition has seen orthopedic and back specialist  -when was the last dose patient took?  Past 24 hours  The patient was advised the importance of maintaining medication and not using illegal substances with these.  Here for refills and follow up  The patient was educated that we can provide 3 monthly scripts for their medication, it is their responsibility to follow the instructions.  Side effects or complications from medications: Denies side effects  Patient is aware that pain medications are meant to minimize the severity of the pain to allow their pain levels to improve to allow for better function. They are aware of that pain medications cannot totally remove their pain.  Due for UDT ( at least once per year) (pain management contract is also completed at the time of the UDT): None ordered today  Scale of 1  to 10 ( 1 is least 10 is most) Your pain level without the medicine: 8-9 Your pain level with medication 5-6  Scale 1 to 10 ( 1-helps very little, 10 helps very well) How well does your pain medication reduce your pain so you can function better through out the day? 6  Quality of the pain: Throbbing aching  Persistence of the pain: Present all time  Modifying factors: Worse with activity   History of Present Illness   An 88 year old female who presents with pain management concerns.  She experiences chronic joint pain, primarily affecting her knees, described as 'bone on bone'. The pain involves approximately twenty joints and varies in intensity throughout the day. She is currently on a regimen of tramadol , taking 100 mg extended release once daily and 50 mg four times a day, totaling 300 mg daily.  Her pain has significantly impacted her mobility, limiting her physical activity. She uses a roller to move around her home and relies on a handrail to get to the bathroom, though it is painful. She had a fall in July, resulting in a scar, and fears falling again, especially when getting up or showering.  She has a history of insomnia, which she attributes to her pain. She reports sleeping only four hours last night, which is better than usual. Previous trials with Lyrica  led to side effects such as itching and insomnia, prompting discontinuation.  Her appetite has slightly declined, but she continues to eat smaller portions of her meals. She has concerns about her ability to manage at home and has considered hiring full-time help, though she finds it expensive. She has been in contact with services that could provide assistance at home.  She feels depressed due to her pain and its impact on her life, although she does not cry often.           Review of Systems     Objective:   Physical Exam  General-in no acute distress Eyes-no discharge Lungs-respiratory rate normal, CTA CV-no  murmurs,RRR Extremities skin warm dry no edema Neuro grossly normal Behavior normal, alert       Assessment & Plan:  Chronic pain syndrome with chronic joint pain Chronic joint pain affecting knees and multiple joints. Current tramadol  regimen provides partial relief but delayed onset. Previous Lyrica  trial caused adverse effects. - Increase tramadol  to 400 mg daily, pending insurance approval. - Consult pharmacist regarding insurance coverage for increased tramadol  dosage. - Continue Advil as needed, monitor for gastrointestinal side effects.  This patient was seen today for chronic pain  The medication list was reviewed and updated.   The patient was seen in followup for chronic pain. A review over at their current pain status was discussed. Drug registry was checked. Prescriptions were given.  Regular follow-up recommended. Discussion was held regarding the importance of compliance with medication as well as pain medication contract.  Patient was informed that medication may cause drowsiness and should not be combined  with other medications/alcohol or street drugs. If the patient feels medication is causing altered alertness then do not drive or operate dangerous equipment.  Should be noted that the patient appears to be meeting appropriate use of opioids and response.  Evidenced by improved function and decent pain control without significant side effects and no evidence of overt aberrancy issues.  Upon discussion with the patient today they understand that opioid therapy is optional and they feel that the pain has been refractory to reasonable conservative measures and is significant and affecting quality of life enough to warrant ongoing therapy and wishes to continue opioids.  Refills were provided.  [place]  medical Board guidelines regarding the pain medicine has been reviewed.  CDC guidelines most updated 2022 has been reviewed by the prescriber.  PDMP is checked on a  regular basis yearly urine drug screen and pain management contract  Treatment plan for this patient includes #1-gentle stretching exercises as shown daily basis 2.  Mild strength exercises 3 times per week #3 continue pain medications #4 notify us  if any digression  Diabetes-so far being controlled dietary healthy diet regular activity Hyperlipidemia-taking her medication we will check labs before next visit High risk medications will check labs before next visit  Patient poses a very difficult issue of trying to help her pain.  Hydrocodone  caused side effects.  Oxycodone  too strong.  Doing okay with tramadol .  Not a good candidate for NSAIDs.  Did not tolerate Lyrica .    Impaired mobility with history of falls Impaired mobility with roller reliance and fall history. Fear of falling, especially during certain activities. Declines physical therapy. - Discuss chronic care services including nurse visits for mobility and safety assistance at home.  Chronic insomnia Chronic insomnia with difficulty maintaining sleep. Over-the-counter options discussed, no safe prescription medications identified. - Consider over-the-counter melatonin for sleep support.   Goals of Care Discussed future care needs and preferences for in-home care. Considering  full-time help at home. Open to chronic care services for additional support. - Consult chronic care management services for in-home care options. - Discuss potential for chronic care services to provide nurse visits and additional support at home.  This is a very nice patient who has some very challenging issues.  She has gotten to the point of life where she is not very mobile.  She has significant chronic pain and insomnia.  It is not possible for her to be on medications to help her sleep given the pain medicine As for her pain medicine we will increase the tramadol  to 100 mg extended release twice daily and may utilize 50 mg every 6 as needed to  supplement for now I would recommend 3 tablets of those per day maximum but over the next 2 weeks if that is not doing enough we can go toward 4 tablets which will be 400 mg total which would be the maximum dose  If this does not help what is going on we may need to send her back to pain management for further input

## 2024-03-20 NOTE — Telephone Encounter (Signed)
 Patient scheduled today at 10:40am with Dr Glendia

## 2024-03-21 ENCOUNTER — Telehealth: Payer: Self-pay | Admitting: Family Medicine

## 2024-03-21 MED ORDER — TRAMADOL HCL ER 200 MG PO TB24: 200.0000 mg | ORAL_TABLET | Freq: Every day | ORAL | 3 refills | Status: DC

## 2024-03-21 NOTE — Telephone Encounter (Signed)
 [email or web address]

## 2024-03-21 NOTE — Telephone Encounter (Signed)
 Nurses I saw patient yesterday-I would recommend speaking with the husband because the patient is deaf for the most part I told them that we will be adjusting her tramadol  Tramadol  extended release will now be 200 mg 1/day We sent this prescription in  She may supplement it with the 50 mg tramadol  1 every 6 hours as needed but for the first 2 weeks I would recommend that she not exceed 3 of these per day  That would still be 350 mg of tramadol  a day which is more than what she is currently on which is 300 mg a day then we can reassess if that is not doing well enough we can move to the 50 mg 4 times per day  Family is to give us  feedback within 2 weeks how the new regimen is working out  Plus go ahead and give her referral to [facility] chronic care management because patient has severe osteoarthritis chronic pain in his very debilitated and essentially homebound.  He they would benefit from having ongoing chronic care management plus also even the possibility of social service consult to see if there can be any help for the family-she lives with her husband

## 2024-03-22 NOTE — Telephone Encounter (Signed)
 Called pt's husband and shared provider's note. He will pick up medication at pharmacy. Husband would like to know when to expect to be contacted by [facility]. Please advise.

## 2024-03-29 ENCOUNTER — Telehealth: Payer: Self-pay | Admitting: *Deleted

## 2024-03-29 NOTE — Telephone Encounter (Signed)
 Copied from CRM #[id number]. Topic: Referral - Question >> Mar 29, 2024  1:28 PM Charlet HERO wrote: Reason for CRM: Patients husband is calling to ck on the oder to have home health come out to see his wife per the notes on 08/14 Brinda Medora HERO, RN  Registered Nurse   Telephone Encounter Signed   Encounter Date: 03/21/2024  Signed    Called pt's husband and shared provider's note. He will pick up medication at pharmacy. Husband would like to know when to expect to be contacted by [facility]. Please advise.    Electronically signed by Brinda Medora HERO, RN at 03/22/2024  5:28 PM  Reached out to get information about the request did not see a request in the charts for the patient. Please contact patient asap.

## 2024-03-30 ENCOUNTER — Encounter: Payer: Self-pay | Admitting: Family Medicine

## 2024-03-30 NOTE — Telephone Encounter (Signed)
 Nurses Please check with [facility] See if she would qualify for chronic long-term health This patient is essentially homebound Cannot drive Has severe arthralgias and arthritis with chronic pain.  Because of this she does minimal ambulation.  Family would benefit from having chronic care nursing as well as social service consult and possible help with aides or at least family/husband being given some guidance on that If [facility] does not feel that they can offer services I recommend home health consult for evaluation with physical therapy but I would prefer chronic long-term nursing health care for this patient  Please keep husband informed

## 2024-04-02 NOTE — Telephone Encounter (Signed)
 Telephone referral given to [facility] - they stated they will reach out and call them this afternoon to start the intake process

## 2024-04-06 ENCOUNTER — Other Ambulatory Visit: Payer: Self-pay

## 2024-04-06 ENCOUNTER — Emergency Department (HOSPITAL_COMMUNITY)
Admission: EM | Admit: 2024-04-06 | Discharge: 2024-04-06 | Discharge status: Against Medical Advice | Attending: Emergency Medicine | Admitting: Emergency Medicine

## 2024-04-06 ENCOUNTER — Encounter (HOSPITAL_COMMUNITY): Payer: Self-pay | Admitting: Emergency Medicine

## 2024-04-06 DIAGNOSIS — Z5321 Procedure and treatment not carried out due to patient leaving prior to being seen by health care provider: Secondary | ICD-10-CM | POA: Diagnosis not present

## 2024-04-06 DIAGNOSIS — M2550 Pain in unspecified joint: Secondary | ICD-10-CM | POA: Diagnosis not present

## 2024-04-06 NOTE — ED Triage Notes (Signed)
 Pt c/o of joint pain and not sleeping well x 2 years. Per her husband, pcp said pt could come to the ED to get pain under control.

## 2024-04-20 DIAGNOSIS — I10 Essential (primary) hypertension: Secondary | ICD-10-CM | POA: Diagnosis not present

## 2024-04-20 DIAGNOSIS — M6281 Muscle weakness (generalized): Secondary | ICD-10-CM | POA: Diagnosis not present

## 2024-04-20 DIAGNOSIS — Z515 Encounter for palliative care: Secondary | ICD-10-CM | POA: Diagnosis not present

## 2024-04-20 DIAGNOSIS — R296 Repeated falls: Secondary | ICD-10-CM | POA: Diagnosis not present

## 2024-04-20 DIAGNOSIS — G4700 Insomnia, unspecified: Secondary | ICD-10-CM | POA: Diagnosis not present

## 2024-04-20 DIAGNOSIS — G894 Chronic pain syndrome: Secondary | ICD-10-CM | POA: Diagnosis not present

## 2024-04-20 DIAGNOSIS — E119 Type 2 diabetes mellitus without complications: Secondary | ICD-10-CM | POA: Diagnosis not present

## 2024-04-20 DIAGNOSIS — Z66 Do not resuscitate: Secondary | ICD-10-CM | POA: Diagnosis not present

## 2024-04-23 ENCOUNTER — Other Ambulatory Visit: Payer: Self-pay | Admitting: Family Medicine

## 2024-05-02 ENCOUNTER — Telehealth: Payer: Self-pay | Admitting: Family Medicine

## 2024-05-02 NOTE — Telephone Encounter (Signed)
 Nurses Recently they sent us  of blood pressure readings from the home Let her know I looked over these blood pressure readings and I am concerned because several of them are very high Morning readings anywhere from 171/97 through 214/137 and evening readings typically 161/107 but one of them was 119/89 and another 1 was 159/106 and another 1 was 127/107  Needless to say blood pressure readings are elevated It should be noted that blood pressure readings can fluctuate based around what type of blood pressure cuff is being utilized and how accurate it may be or might not be and whether or not the cuff is a proper size  Given that somebody's blood pressure readings are very high I would recommend a follow-up office visit with myself preferably but if my slots are not available to follow-up with Elveria or Charmaine This office visit should occur next week if possible if not possible for next week the following week obviously if patient is having severe headaches vomiting or anything worrisome urgent follow-up with us  preferably urgent care would be second choice  Please do your best to try to get read with myself or Elveria or Charmaine next week thank you

## 2024-05-28 DIAGNOSIS — M19072 Primary osteoarthritis, left ankle and foot: Secondary | ICD-10-CM | POA: Diagnosis not present

## 2024-05-28 DIAGNOSIS — G4700 Insomnia, unspecified: Secondary | ICD-10-CM | POA: Diagnosis not present

## 2024-05-28 DIAGNOSIS — M25541 Pain in joints of right hand: Secondary | ICD-10-CM | POA: Diagnosis not present

## 2024-05-28 DIAGNOSIS — Z515 Encounter for palliative care: Secondary | ICD-10-CM | POA: Diagnosis not present

## 2024-05-28 DIAGNOSIS — I10 Essential (primary) hypertension: Secondary | ICD-10-CM | POA: Diagnosis not present

## 2024-05-28 DIAGNOSIS — M170 Bilateral primary osteoarthritis of knee: Secondary | ICD-10-CM | POA: Diagnosis not present

## 2024-05-28 DIAGNOSIS — G894 Chronic pain syndrome: Secondary | ICD-10-CM | POA: Diagnosis not present

## 2024-05-28 DIAGNOSIS — M25542 Pain in joints of left hand: Secondary | ICD-10-CM | POA: Diagnosis not present

## 2024-06-25 ENCOUNTER — Telehealth: Payer: Self-pay | Admitting: Family Medicine

## 2024-06-25 ENCOUNTER — Other Ambulatory Visit: Payer: Self-pay | Admitting: Family Medicine

## 2024-06-25 ENCOUNTER — Other Ambulatory Visit: Payer: Self-pay

## 2024-06-25 MED ORDER — TRAMADOL HCL 50 MG PO TABS: ORAL_TABLET | ORAL | 5 refills | Status: DC

## 2024-06-25 MED ORDER — TRAMADOL HCL ER 200 MG PO TB24: 200.0000 mg | ORAL_TABLET | Freq: Every day | ORAL | 3 refills | Status: DC

## 2024-06-25 NOTE — Telephone Encounter (Signed)
 Prescription Request  06/25/2024  LOV: 03/20/2024  What is the name of the medication or equipment? traMADol  (ULTRAM ) 50 MG tablet   Have you contacted your pharmacy to request a refill? Yes   Which pharmacy would you like this sent to?  [facility] [place], [place] - [id number] [place] [address] Phone: [phone] Fax: [phone]    Patient notified that their request is being sent to the clinical staff for review and that they should receive a response within 2 business days.   Please advise at [facility] [phone]

## 2024-06-25 NOTE — Telephone Encounter (Signed)
 Tramadol  refills was sent in This was for the 50 mg tramadol  as well as the 200 mg extended release At 1 time she was also taking 100 mg tramadol  uncertain if she needs this?  It is not on her med list currently? Also she has 2 appointments coming up 1 with Charmaine on 8 December and 1 with me on 12 December This seems a little bit different?  Not sure if she truly needs both appointments She is traditionally my patient Please try to clarify with the patient or her husband which appointment they are coming to Possibly there is something going on that I am not aware of FYI-patient has a hard time hearing

## 2024-06-26 ENCOUNTER — Telehealth: Payer: Self-pay

## 2024-06-26 NOTE — Telephone Encounter (Signed)
 Copied from CRM [id number]. Topic: Clinical - Medication Question >> Jun 26, 2024 11:09 AM Everette C wrote: Reason for CRM: Reason for CRM: The patient's husband has called to request contact from a member of practice staff to discuss the potential prescription of blood pressure medication for the patient prior to scheduling an appointment. The patient has been encouraged by Prov. Dorothyann to begin taking the medication to lower their BP so they can start taking medication for other ailments. The patient's husband would like to please speak with a member of staff to discuss potential medications and a timeline for starting them.

## 2024-06-27 NOTE — Telephone Encounter (Signed)
 [email or web address]

## 2024-06-28 ENCOUNTER — Telehealth: Payer: Self-pay

## 2024-06-28 ENCOUNTER — Other Ambulatory Visit: Payer: Self-pay

## 2024-06-28 ENCOUNTER — Other Ambulatory Visit: Payer: Self-pay | Admitting: Family Medicine

## 2024-06-28 MED ORDER — AMLODIPINE BESYLATE 5 MG PO TABS: 5.0000 mg | ORAL_TABLET | Freq: Every day | ORAL | 1 refills | Status: DC

## 2024-06-28 NOTE — Telephone Encounter (Signed)
 Addressed in separate encounter.

## 2024-06-28 NOTE — Telephone Encounter (Signed)
 I agree-I had spoken with the nurse that goes out and checks on her and she told me that high blood pressures the patient is not taking any medications so I recommended amlodipine  5 mg daily and a follow-up visit if possible if patient is unable to come to the office then the nurse who checks in on her at her home could check the blood pressure and relay that to us  ([facility] provides additional support with their nursing staff with home visits)

## 2024-06-28 NOTE — Telephone Encounter (Signed)
 Spoke with patient and husband she has been advised of the increase to 5 mg amlodipine  daily. They were advised to track BP readings and call them in, agree to schedule an appt w/ any available provider as needed.

## 2024-06-28 NOTE — Progress Notes (Signed)
 Pt with severe htn according to [facility]-Catherine Not takin amlodipine  I rec restarting amlodipine  but at 5 mg She will send me update in the future

## 2024-06-28 NOTE — Telephone Encounter (Signed)
 Copied from CRM #[id number]. Topic: General - Call Back - No Documentation >> Jun 28, 2024 10:18 AM Terri MATSU wrote: Reason for CRM: Patient husband Mr,Nedved returning [facility] call. Callback number [phone]

## 2024-07-03 ENCOUNTER — Telehealth: Payer: Self-pay

## 2024-07-03 NOTE — Telephone Encounter (Signed)
 Thank you for the blood pressure readings So we just really started the amlodipine  5 days ago I typically like to give it at least 10 to 14 days of action before adding additional medicines For now we will hold with the current medication She has a follow-up visit in December please keep

## 2024-07-03 NOTE — Telephone Encounter (Signed)
 Deanna Salinas BP readings dropped off placed in Dr Glendia folder up front

## 2024-07-03 NOTE — Telephone Encounter (Signed)
 The following are pt's BP readings 159/92-11/12  ,   161/100- 11/13  , 160/90- 11/14   ,  155/93  11/15,   143/95  11/16,   180/175  -11/18,   152/98   11/18,   124/74-   11/19,   148/174  - 11/20,   164/75  -- 11/21,   182/125-  11/22,   195/147-   11/23,   143/84-  11/23,   155/98--  11/24

## 2024-07-04 NOTE — Telephone Encounter (Signed)
 Called patient/patients husband to get a better explanation. It was addressed to me that a nurse named Dorothyann was coming to their place Monday Dec 1st. They are going to try and attempt to get her to get blood work completed before her appointment here on Dec 8th. It was addressed to me she is struggling really bad right now and not sleeping very well. Her husband verbalized he understood about the medication  and just taking everything step by step.

## 2024-07-07 ENCOUNTER — Other Ambulatory Visit: Payer: Self-pay | Admitting: Family Medicine

## 2024-07-11 ENCOUNTER — Telehealth: Payer: Self-pay

## 2024-07-11 DIAGNOSIS — M170 Bilateral primary osteoarthritis of knee: Secondary | ICD-10-CM | POA: Diagnosis not present

## 2024-07-11 DIAGNOSIS — Z515 Encounter for palliative care: Secondary | ICD-10-CM | POA: Diagnosis not present

## 2024-07-11 DIAGNOSIS — M19072 Primary osteoarthritis, left ankle and foot: Secondary | ICD-10-CM | POA: Diagnosis not present

## 2024-07-11 DIAGNOSIS — I10 Essential (primary) hypertension: Secondary | ICD-10-CM | POA: Diagnosis not present

## 2024-07-11 DIAGNOSIS — G894 Chronic pain syndrome: Secondary | ICD-10-CM | POA: Diagnosis not present

## 2024-07-11 DIAGNOSIS — G4700 Insomnia, unspecified: Secondary | ICD-10-CM | POA: Diagnosis not present

## 2024-07-11 DIAGNOSIS — M25542 Pain in joints of left hand: Secondary | ICD-10-CM | POA: Diagnosis not present

## 2024-07-11 DIAGNOSIS — M25541 Pain in joints of right hand: Secondary | ICD-10-CM | POA: Diagnosis not present

## 2024-07-11 NOTE — Telephone Encounter (Signed)
 Called and spoke with patient's husband to let pt know per Drs' notes , he verbalized understanding .

## 2024-07-11 NOTE — Telephone Encounter (Signed)
 Patient has an upcoming appt on 12/8 with Charmaine, requesting labs be drawn at home by some anyone who provides these services

## 2024-07-11 NOTE — Telephone Encounter (Signed)
 Unfortunately there is no one we will do those type of services When she comes to see Charmaine labs can be ordered and she can get those drawn the same day at [facility] right down the hall

## 2024-07-16 ENCOUNTER — Ambulatory Visit: Admitting: Physician Assistant

## 2024-07-16 ENCOUNTER — Encounter: Payer: Self-pay | Admitting: Physician Assistant

## 2024-07-16 VITALS — BP 163/66 | HR 91 | Temp 97.7°F

## 2024-07-16 DIAGNOSIS — E785 Hyperlipidemia, unspecified: Secondary | ICD-10-CM | POA: Diagnosis not present

## 2024-07-16 DIAGNOSIS — Z79899 Other long term (current) drug therapy: Secondary | ICD-10-CM | POA: Diagnosis not present

## 2024-07-16 DIAGNOSIS — I10 Essential (primary) hypertension: Secondary | ICD-10-CM | POA: Diagnosis not present

## 2024-07-16 DIAGNOSIS — E119 Type 2 diabetes mellitus without complications: Secondary | ICD-10-CM | POA: Diagnosis not present

## 2024-07-16 DIAGNOSIS — E1169 Type 2 diabetes mellitus with other specified complication: Secondary | ICD-10-CM | POA: Diagnosis not present

## 2024-07-16 MED ORDER — AMLODIPINE BESYLATE 10 MG PO TABS: 10.0000 mg | ORAL_TABLET | Freq: Every day | ORAL | 1 refills | Status: DC

## 2024-07-16 NOTE — Progress Notes (Signed)
 Acute Office Visit  Subjective:     Patient ID: Deanna Salinas, female    DOB: 1935-09-05, 88 y.o.   MRN: [id number]   Discussed the use of AI scribe software for clinical note transcription with the patient, who gave verbal consent to proceed.  History of Present Illness Deanna Salinas is an 88 year old female with hypertension who presents for follow-up on blood pressure management. She is accompanied by her husband.   Her home blood pressure readings have become elevated, averaging 150/90, after previously being normal. She uses a 22-year-old arm cuff and notes its readings are a few points higher than our monitor, she has previously brought her cuff to an appointment. She has headaches at least twice a week. She denies chest pain or shortness of breath.  She has arthritis with worsening pain over the last three weeks that is limiting her daily activities. She takes tramadol  for pain control.    Review of Systems  Respiratory:  Negative for chest tightness and shortness of breath.   Cardiovascular:  Negative for chest pain and leg swelling.  Musculoskeletal:  Positive for arthralgias, back pain and myalgias.  Neurological:  Positive for headaches. Negative for dizziness and light-headedness.  Psychiatric/Behavioral:  Positive for sleep disturbance.        Objective:     BP (!) 163/66   Pulse 91   Temp 97.7 F (36.5 C)   SpO2 90%   Physical Exam Constitutional:      Appearance: Normal appearance.     Comments: Visibly uncomfortable in exam room  HENT:     Head: Normocephalic and atraumatic.     Mouth/Throat:     Mouth: Mucous membranes are moist.     Pharynx: Oropharynx is clear.  Eyes:     Extraocular Movements: Extraocular movements intact.     Conjunctiva/sclera: Conjunctivae normal.  Cardiovascular:     Rate and Rhythm: Normal rate and regular rhythm.     Heart sounds: Normal heart sounds. No murmur heard. Pulmonary:     Effort: Pulmonary effort is normal.      Breath sounds: Normal breath sounds. No wheezing, rhonchi or rales.  Musculoskeletal:     Right lower leg: No edema.     Left lower leg: No edema.  Skin:    General: Skin is warm and dry.  Neurological:     General: No focal deficit present.     Mental Status: She is alert and oriented to person, place, and time.  Psychiatric:        Mood and Affect: Mood normal.        Behavior: Behavior normal.     No results found for any visits on 07/16/24.      Assessment & Plan:  Essential hypertension Assessment & Plan: Patient presents today for management of blood pressure. Home blood pressure readings elevated; initial in-office reading normal, however 2nd reading above goal, likely related to pain. Home cuff slightly higher. Headaches likely related to elevated blood pressure.  Control is variable. Continue current medications. No change in management. Reports she is taking 2 5 mg amlodipine  tablets and requests refill.  Discussed DASH diet and dietary sodium restrictions.  Continue monitoring blood pressure at home and contact clinic for readings consistently above 140/90 at home or for new symptoms such as chest pain, shortness of breath, or visual changes.     Orders: -     amLODIPine  Besylate; Take 1 tablet (10 mg total) by mouth daily.  Dispense: 90 tablet; Refill: 1    Return in about 3 months (around 10/14/2024) for for pain with Dr. Glendia.  Charmaine Adelfo Diebel, PA-C

## 2024-07-16 NOTE — Assessment & Plan Note (Signed)
 Patient presents today for management of blood pressure. Home blood pressure readings elevated; initial in-office reading normal, however 2nd reading above goal, likely related to pain. Home cuff slightly higher. Headaches likely related to elevated blood pressure.  Control is variable. Continue current medications. No change in management. Reports she is taking 2 5 mg amlodipine  tablets and requests refill.  Discussed DASH diet and dietary sodium restrictions.  Continue monitoring blood pressure at home and contact clinic for readings consistently above 140/90 at home or for new symptoms such as chest pain, shortness of breath, or visual changes.

## 2024-07-17 ENCOUNTER — Other Ambulatory Visit: Payer: Self-pay | Admitting: Family Medicine

## 2024-07-17 ENCOUNTER — Ambulatory Visit: Payer: Self-pay | Admitting: Family Medicine

## 2024-07-17 ENCOUNTER — Telehealth: Payer: Self-pay | Admitting: *Deleted

## 2024-07-17 DIAGNOSIS — E538 Deficiency of other specified B group vitamins: Secondary | ICD-10-CM

## 2024-07-17 DIAGNOSIS — D7589 Other specified diseases of blood and blood-forming organs: Secondary | ICD-10-CM

## 2024-07-17 LAB — LIPID PANEL
Chol/HDL Ratio: 2.1 ratio (ref 0.0–4.4)
Cholesterol, Total: 188 mg/dL (ref 100–199)
HDL: 90 mg/dL (ref >39)
LDL Chol Calc (NIH): 83 mg/dL (ref 0–99)
Triglycerides: 82 mg/dL (ref 0–149)
VLDL Cholesterol Cal: 15 mg/dL (ref 5–40)

## 2024-07-17 LAB — HEPATIC FUNCTION PANEL
ALT: 14 IU/L (ref 0–32)
AST: 17 IU/L (ref 0–40)
Albumin: 4.3 g/dL (ref 3.7–4.7)
Alkaline Phosphatase: 130 IU/L — ABNORMAL HIGH (ref 48–129)
Bilirubin Total: 0.3 mg/dL (ref 0.0–1.2)
Bilirubin, Direct: 0.13 mg/dL (ref 0.00–0.40)
Total Protein: 6.7 g/dL (ref 6.0–8.5)

## 2024-07-17 LAB — CBC WITH DIFFERENTIAL/PLATELET
Basophils Absolute: 0.1 x10E3/uL (ref 0.0–0.2)
Basos: 1 %
EOS (ABSOLUTE): 0.4 x10E3/uL (ref 0.0–0.4)
Eos: 4 %
Hematocrit: 42.3 % (ref 34.0–46.6)
Hemoglobin: 13.7 g/dL (ref 11.1–15.9)
Immature Grans (Abs): 0 x10E3/uL (ref 0.0–0.1)
Immature Granulocytes: 0 %
Lymphocytes Absolute: 1.7 x10E3/uL (ref 0.7–3.1)
Lymphs: 17 %
MCH: 33.1 pg — ABNORMAL HIGH (ref 26.6–33.0)
MCHC: 32.4 g/dL (ref 31.5–35.7)
MCV: 102 fL — ABNORMAL HIGH (ref 79–97)
Monocytes Absolute: 0.8 x10E3/uL (ref 0.1–0.9)
Monocytes: 8 %
Neutrophils Absolute: 7.1 x10E3/uL — ABNORMAL HIGH (ref 1.4–7.0)
Neutrophils: 70 %
Platelets: 263 x10E3/uL (ref 150–450)
RBC: 4.14 x10E6/uL (ref 3.77–5.28)
RDW: 12 % (ref 11.7–15.4)
WBC: 10.1 x10E3/uL (ref 3.4–10.8)

## 2024-07-17 LAB — BASIC METABOLIC PANEL WITH GFR
BUN/Creatinine Ratio: 23 (ref 12–28)
BUN: 18 mg/dL (ref 8–27)
CO2: 29 mmol/L (ref 20–29)
Calcium: 9.8 mg/dL (ref 8.7–10.3)
Chloride: 96 mmol/L (ref 96–106)
Creatinine, Ser: 0.8 mg/dL (ref 0.57–1.00)
Glucose: 89 mg/dL (ref 70–99)
Potassium: 5.2 mmol/L (ref 3.5–5.2)
Sodium: 138 mmol/L (ref 134–144)
eGFR: 72 mL/min/1.73 (ref >59)

## 2024-07-17 LAB — HEMOGLOBIN A1C
Est. average glucose Bld gHb Est-mCnc: 114 mg/dL
Hgb A1c MFr Bld: 5.6 % (ref 4.8–5.6)

## 2024-07-17 NOTE — Telephone Encounter (Unsigned)
 Copied from CRM #[id number]. Topic: Clinical - Medication Question >> Jul 17, 2024  3:26 PM Antwanette L wrote: Reason for CRM: Rich from [facility] is calling for clarification regarding the patient tramadol  prescription. On 12/1, the patient picked up traMADol  (ULTRAM -ER) 100 MG 24 hr tablet. The patient is now requesting traMADol  (ULTRAM -ER) 200 MG 24 hr tablet The pharmacy needs to know whether they should refill the prescription for the 200 mg. Please follow up with Rich at [phone]

## 2024-07-18 NOTE — Telephone Encounter (Signed)
 I spoke with pharmacist.  Her dosage for tramadol  is at the upper limits of prescribing.  But she seems to tolerate it fairly well.  I am hesitant regarding putting her on additional or other pain medicine currently.  It is possible palliative nursing care may be able to come up with alternative plan through their protocols.

## 2024-07-20 ENCOUNTER — Ambulatory Visit: Admitting: Family Medicine

## 2024-07-24 ENCOUNTER — Other Ambulatory Visit: Payer: Self-pay | Admitting: Family Medicine

## 2024-07-24 NOTE — Telephone Encounter (Signed)
 Nurses The amlodipine  prescription request is wrong she is on 10 mg.  Please deny that 2.5 Celebrex  she may have 90-day with 1 refill

## 2024-08-05 ENCOUNTER — Other Ambulatory Visit: Payer: Self-pay | Admitting: Family Medicine

## 2024-08-05 DIAGNOSIS — G894 Chronic pain syndrome: Secondary | ICD-10-CM

## 2024-08-13 ENCOUNTER — Inpatient Hospital Stay (HOSPITAL_COMMUNITY)
Admission: EM | Admit: 2024-08-13 | Discharge: 2024-08-20 | DRG: 871 | Disposition: A | Discharge status: Skilled Nursing Facility | Attending: Internal Medicine | Admitting: Internal Medicine

## 2024-08-13 ENCOUNTER — Other Ambulatory Visit: Payer: Self-pay

## 2024-08-13 ENCOUNTER — Encounter (HOSPITAL_COMMUNITY): Payer: Self-pay

## 2024-08-13 ENCOUNTER — Emergency Department (HOSPITAL_COMMUNITY)

## 2024-08-13 DIAGNOSIS — Z515 Encounter for palliative care: Secondary | ICD-10-CM

## 2024-08-13 DIAGNOSIS — J181 Lobar pneumonia, unspecified organism: Secondary | ICD-10-CM | POA: Insufficient documentation

## 2024-08-13 DIAGNOSIS — I5033 Acute on chronic diastolic (congestive) heart failure: Secondary | ICD-10-CM | POA: Diagnosis present

## 2024-08-13 DIAGNOSIS — A419 Sepsis, unspecified organism: Secondary | ICD-10-CM | POA: Diagnosis present

## 2024-08-13 DIAGNOSIS — E875 Hyperkalemia: Secondary | ICD-10-CM | POA: Diagnosis present

## 2024-08-13 DIAGNOSIS — Z66 Do not resuscitate: Secondary | ICD-10-CM | POA: Diagnosis not present

## 2024-08-13 DIAGNOSIS — Z8249 Family history of ischemic heart disease and other diseases of the circulatory system: Secondary | ICD-10-CM

## 2024-08-13 DIAGNOSIS — I2489 Other forms of acute ischemic heart disease: Secondary | ICD-10-CM | POA: Diagnosis present

## 2024-08-13 DIAGNOSIS — E119 Type 2 diabetes mellitus without complications: Secondary | ICD-10-CM | POA: Diagnosis present

## 2024-08-13 DIAGNOSIS — I5031 Acute diastolic (congestive) heart failure: Secondary | ICD-10-CM

## 2024-08-13 DIAGNOSIS — Z79899 Other long term (current) drug therapy: Secondary | ICD-10-CM | POA: Diagnosis not present

## 2024-08-13 DIAGNOSIS — R6520 Severe sepsis without septic shock: Secondary | ICD-10-CM | POA: Diagnosis present

## 2024-08-13 DIAGNOSIS — I10 Essential (primary) hypertension: Secondary | ICD-10-CM | POA: Diagnosis present

## 2024-08-13 DIAGNOSIS — E876 Hypokalemia: Secondary | ICD-10-CM | POA: Diagnosis not present

## 2024-08-13 DIAGNOSIS — J9602 Acute respiratory failure with hypercapnia: Secondary | ICD-10-CM | POA: Diagnosis present

## 2024-08-13 DIAGNOSIS — I110 Hypertensive heart disease with heart failure: Secondary | ICD-10-CM | POA: Diagnosis present

## 2024-08-13 DIAGNOSIS — I081 Rheumatic disorders of both mitral and tricuspid valves: Secondary | ICD-10-CM | POA: Diagnosis present

## 2024-08-13 DIAGNOSIS — Z888 Allergy status to other drugs, medicaments and biological substances status: Secondary | ICD-10-CM

## 2024-08-13 DIAGNOSIS — H9193 Unspecified hearing loss, bilateral: Secondary | ICD-10-CM | POA: Diagnosis present

## 2024-08-13 DIAGNOSIS — E785 Hyperlipidemia, unspecified: Secondary | ICD-10-CM | POA: Diagnosis present

## 2024-08-13 DIAGNOSIS — G928 Other toxic encephalopathy: Secondary | ICD-10-CM | POA: Diagnosis present

## 2024-08-13 DIAGNOSIS — J9601 Acute respiratory failure with hypoxia: Secondary | ICD-10-CM | POA: Diagnosis present

## 2024-08-13 DIAGNOSIS — G8929 Other chronic pain: Secondary | ICD-10-CM | POA: Diagnosis present

## 2024-08-13 DIAGNOSIS — K760 Fatty (change of) liver, not elsewhere classified: Secondary | ICD-10-CM | POA: Diagnosis present

## 2024-08-13 DIAGNOSIS — F1120 Opioid dependence, uncomplicated: Secondary | ICD-10-CM | POA: Diagnosis not present

## 2024-08-13 DIAGNOSIS — Z90710 Acquired absence of both cervix and uterus: Secondary | ICD-10-CM

## 2024-08-13 DIAGNOSIS — M170 Bilateral primary osteoarthritis of knee: Secondary | ICD-10-CM | POA: Diagnosis present

## 2024-08-13 DIAGNOSIS — G894 Chronic pain syndrome: Secondary | ICD-10-CM | POA: Diagnosis present

## 2024-08-13 DIAGNOSIS — F419 Anxiety disorder, unspecified: Secondary | ICD-10-CM | POA: Diagnosis present

## 2024-08-13 DIAGNOSIS — H9190 Unspecified hearing loss, unspecified ear: Secondary | ICD-10-CM | POA: Diagnosis present

## 2024-08-13 DIAGNOSIS — Z1152 Encounter for screening for COVID-19: Secondary | ICD-10-CM

## 2024-08-13 DIAGNOSIS — D72829 Elevated white blood cell count, unspecified: Secondary | ICD-10-CM | POA: Diagnosis present

## 2024-08-13 DIAGNOSIS — J690 Pneumonitis due to inhalation of food and vomit: Secondary | ICD-10-CM | POA: Diagnosis present

## 2024-08-13 DIAGNOSIS — Z781 Physical restraint status: Secondary | ICD-10-CM

## 2024-08-13 DIAGNOSIS — T402X1A Poisoning by other opioids, accidental (unintentional), initial encounter: Secondary | ICD-10-CM | POA: Diagnosis present

## 2024-08-13 DIAGNOSIS — F5104 Psychophysiologic insomnia: Secondary | ICD-10-CM | POA: Diagnosis present

## 2024-08-13 DIAGNOSIS — E1169 Type 2 diabetes mellitus with other specified complication: Secondary | ICD-10-CM | POA: Diagnosis present

## 2024-08-13 DIAGNOSIS — J189 Pneumonia, unspecified organism: Secondary | ICD-10-CM | POA: Diagnosis present

## 2024-08-13 DIAGNOSIS — G9340 Encephalopathy, unspecified: Secondary | ICD-10-CM | POA: Diagnosis not present

## 2024-08-13 DIAGNOSIS — Z7189 Other specified counseling: Secondary | ICD-10-CM | POA: Diagnosis not present

## 2024-08-13 LAB — CBC WITH DIFFERENTIAL/PLATELET
Abs Immature Granulocytes: 0.05 K/uL (ref 0.00–0.07)
Basophils Absolute: 0.1 K/uL (ref 0.0–0.1)
Basophils Relative: 0 %
Eosinophils Absolute: 0 K/uL (ref 0.0–0.5)
Eosinophils Relative: 0 %
HCT: 43.8 % (ref 36.0–46.0)
Hemoglobin: 13.5 g/dL (ref 12.0–15.0)
Immature Granulocytes: 0 %
Lymphocytes Relative: 10 %
Lymphs Abs: 1.1 K/uL (ref 0.7–4.0)
MCH: 33.4 pg (ref 26.0–34.0)
MCHC: 30.8 g/dL (ref 30.0–36.0)
MCV: 108.4 fL — ABNORMAL HIGH (ref 80.0–100.0)
Monocytes Absolute: 0.7 K/uL (ref 0.1–1.0)
Monocytes Relative: 6 %
Neutro Abs: 9.3 K/uL — ABNORMAL HIGH (ref 1.7–7.7)
Neutrophils Relative %: 84 %
Platelets: 288 K/uL (ref 150–400)
RBC: 4.04 MIL/uL (ref 3.87–5.11)
RDW: 13.2 % (ref 11.5–15.5)
WBC: 11.2 K/uL — ABNORMAL HIGH (ref 4.0–10.5)
nRBC: 0 % (ref 0.0–0.2)

## 2024-08-13 LAB — COMPREHENSIVE METABOLIC PANEL WITH GFR
ALT: 15 U/L (ref 0–44)
AST: 25 U/L (ref 15–41)
Albumin: 4.3 g/dL (ref 3.5–5.0)
Alkaline Phosphatase: 110 U/L (ref 38–126)
Anion gap: 15 (ref 5–15)
BUN: 40 mg/dL — ABNORMAL HIGH (ref 8–23)
CO2: 23 mmol/L (ref 22–32)
Calcium: 9.3 mg/dL (ref 8.9–10.3)
Chloride: 97 mmol/L — ABNORMAL LOW (ref 98–111)
Creatinine, Ser: 0.95 mg/dL (ref 0.44–1.00)
GFR, Estimated: 58 mL/min — ABNORMAL LOW
Glucose, Bld: 150 mg/dL — ABNORMAL HIGH (ref 70–99)
Potassium: 5.5 mmol/L — ABNORMAL HIGH (ref 3.5–5.1)
Sodium: 136 mmol/L (ref 135–145)
Total Bilirubin: 0.4 mg/dL (ref 0.0–1.2)
Total Protein: 7.2 g/dL (ref 6.5–8.1)

## 2024-08-13 LAB — BLOOD GAS, VENOUS
Acid-Base Excess: 1.8 mmol/L (ref 0.0–2.0)
Bicarbonate: 31.1 mmol/L — ABNORMAL HIGH (ref 20.0–28.0)
Drawn by: 1528
O2 Saturation: 86.2 %
Patient temperature: 36.7
pCO2, Ven: 70 mmHg — ABNORMAL HIGH (ref 44–60)
pH, Ven: 7.25 (ref 7.25–7.43)
pO2, Ven: 53 mmHg — ABNORMAL HIGH (ref 32–45)

## 2024-08-13 LAB — RESP PANEL BY RT-PCR (RSV, FLU A&B, COVID)  RVPGX2
Influenza A by PCR: NEGATIVE
Influenza B by PCR: NEGATIVE
Resp Syncytial Virus by PCR: NEGATIVE
SARS Coronavirus 2 by RT PCR: NEGATIVE

## 2024-08-13 LAB — PRO BRAIN NATRIURETIC PEPTIDE: Pro Brain Natriuretic Peptide: 3669 pg/mL — ABNORMAL HIGH

## 2024-08-13 LAB — TROPONIN T, HIGH SENSITIVITY
Troponin T High Sensitivity: 23 ng/L — ABNORMAL HIGH (ref 0–19)
Troponin T High Sensitivity: 26 ng/L — ABNORMAL HIGH (ref 0–19)

## 2024-08-13 LAB — ACETAMINOPHEN LEVEL: Acetaminophen (Tylenol), Serum: <10 ug/mL — ABNORMAL LOW (ref 10–30)

## 2024-08-13 LAB — GLUCOSE, CAPILLARY: Glucose-Capillary: 124 mg/dL — ABNORMAL HIGH (ref 70–99)

## 2024-08-13 LAB — LACTIC ACID, PLASMA
Lactic Acid, Venous: 2 mmol/L (ref 0.5–1.9)
Lactic Acid, Venous: 1.2 mmol/L (ref 0.5–1.9)

## 2024-08-13 LAB — CBG MONITORING, ED: Glucose-Capillary: 143 mg/dL — ABNORMAL HIGH (ref 70–99)

## 2024-08-13 MED ORDER — KETOROLAC TROMETHAMINE 15 MG/ML IJ SOLN
15.0000 mg | Freq: Four times a day (QID) | INTRAMUSCULAR | Status: AC
  Administered 2024-08-13 – 2024-08-14 (×2): 15 mg via INTRAVENOUS
  Filled 2024-08-13: qty 1

## 2024-08-13 MED ORDER — SODIUM CHLORIDE 0.9 % IV SOLN
2.0000 g | INTRAVENOUS | Status: DC
  Administered 2024-08-14: 2 g via INTRAVENOUS
  Filled 2024-08-13: qty 20

## 2024-08-13 MED ORDER — ACETAMINOPHEN 500 MG PO TABS
500.0000 mg | ORAL_TABLET | Freq: Four times a day (QID) | ORAL | Status: DC | PRN
  Administered 2024-08-14: 500 mg via ORAL
  Filled 2024-08-13 (×2): qty 1

## 2024-08-13 MED ORDER — INSULIN ASPART 100 UNIT/ML IJ SOLN
0.0000 [IU] | INTRAMUSCULAR | Status: DC
  Administered 2024-08-13 – 2024-08-14 (×3): 1 [IU] via SUBCUTANEOUS
  Filled 2024-08-13 (×3): qty 1

## 2024-08-13 MED ORDER — LORAZEPAM 2 MG/ML IJ SOLN
0.5000 mg | Freq: Once | INTRAMUSCULAR | Status: AC
  Administered 2024-08-13: 0.5 mg via INTRAVENOUS
  Filled 2024-08-13: qty 1

## 2024-08-13 MED ORDER — ENOXAPARIN SODIUM 40 MG/0.4ML IJ SOSY
40.0000 mg | PREFILLED_SYRINGE | Freq: Every day | INTRAMUSCULAR | Status: DC
  Administered 2024-08-13 – 2024-08-19 (×7): 40 mg via SUBCUTANEOUS
  Filled 2024-08-13 (×6): qty 0.4

## 2024-08-13 MED ORDER — SODIUM CHLORIDE 0.9 % IV SOLN
500.0000 mg | INTRAVENOUS | Status: DC
  Administered 2024-08-14: 500 mg via INTRAVENOUS
  Filled 2024-08-13: qty 5

## 2024-08-13 MED ORDER — LORAZEPAM 2 MG/ML IJ SOLN
INTRAMUSCULAR | Status: AC
  Filled 2024-08-13: qty 1

## 2024-08-13 MED ORDER — KETOROLAC TROMETHAMINE 15 MG/ML IJ SOLN
INTRAMUSCULAR | Status: AC
  Filled 2024-08-13: qty 1

## 2024-08-13 MED ORDER — HYDROCODONE-ACETAMINOPHEN 5-325 MG PO TABS
2.0000 | ORAL_TABLET | Freq: Once | ORAL | Status: AC
  Administered 2024-08-13: 2 via ORAL
  Filled 2024-08-13: qty 2

## 2024-08-13 MED ORDER — SODIUM CHLORIDE 0.9 % IV SOLN
2.0000 g | Freq: Once | INTRAVENOUS | Status: AC
  Administered 2024-08-13: 2 g via INTRAVENOUS
  Filled 2024-08-13: qty 20

## 2024-08-13 MED ORDER — SODIUM CHLORIDE 0.9 % IV SOLN
500.0000 mg | Freq: Once | INTRAVENOUS | Status: AC
  Administered 2024-08-13: 500 mg via INTRAVENOUS
  Filled 2024-08-13: qty 5

## 2024-08-13 MED ORDER — SODIUM CHLORIDE 0.9 % IV BOLUS
500.0000 mL | Freq: Once | INTRAVENOUS | Status: AC
  Administered 2024-08-13: 500 mL via INTRAVENOUS

## 2024-08-13 MED ORDER — MELATONIN 5 MG PO TABS: 5.0000 mg | ORAL_TABLET | Freq: Every evening | ORAL | Status: DC | PRN

## 2024-08-13 MED ORDER — FUROSEMIDE 10 MG/ML IJ SOLN
20.0000 mg | Freq: Once | INTRAMUSCULAR | Status: AC
  Administered 2024-08-13: 20 mg via INTRAVENOUS
  Filled 2024-08-13: qty 2

## 2024-08-13 MED ORDER — MELATONIN 3 MG PO TABS
3.0000 mg | ORAL_TABLET | Freq: Every evening | ORAL | Status: DC | PRN
  Administered 2024-08-15: 3 mg via ORAL
  Filled 2024-08-13: qty 1

## 2024-08-13 MED ORDER — POLYETHYLENE GLYCOL 3350 17 G PO PACK
17.0000 g | PACK | Freq: Every day | ORAL | Status: AC | PRN
  Administered 2024-08-17: 17 g via ORAL
  Filled 2024-08-13: qty 1

## 2024-08-13 MED ORDER — HYDROXYZINE PAMOATE 25 MG PO CAPS: 25.0000 mg | ORAL_CAPSULE | Freq: Three times a day (TID) | ORAL | Status: DC | PRN

## 2024-08-13 MED ORDER — PROCHLORPERAZINE EDISYLATE 10 MG/2ML IJ SOLN: 5.0000 mg | Freq: Four times a day (QID) | INTRAMUSCULAR | Status: DC | PRN

## 2024-08-13 MED ORDER — LORAZEPAM 2 MG/ML IJ SOLN
0.5000 mg | Freq: Once | INTRAMUSCULAR | Status: AC
  Administered 2024-08-13: 0.5 mg via INTRAVENOUS

## 2024-08-13 MED ORDER — CHLORHEXIDINE GLUCONATE CLOTH 2 % EX PADS
6.0000 | MEDICATED_PAD | Freq: Every day | CUTANEOUS | Status: DC
  Administered 2024-08-13 – 2024-08-19 (×7): 6 via TOPICAL

## 2024-08-13 NOTE — ED Provider Notes (Signed)
 " [facility] AT [facility] Provider Note   CSN: [id number] Arrival date & time: 08/13/24  1654     Patient presents with: Respiratory Distress   Deanna Salinas is a 89 y.o. female.   HPI Patient brought in for mental status change and hypoxia.  Reportedly had sats in the 40s for EMS.  Started on CPAP and came up to the 80s or 90s.  Reportedly had decreasing mental status about the day.  Appears to have had her chronic pain pills filled 2 days ago.  There were 14 out of the bottle but unsure if they are kept in a separate container or some other cause for her besides overdose.  However patient does have some mild confusion but is definitely awake and talking at this time.   Past Medical History:  Diagnosis Date   Deaf    Essential hypertension    Fatty liver    Hyperlipidemia    Type 2 diabetes mellitus (HCC)     Prior to Admission medications  Medication Sig Start Date End Date Taking? Authorizing Provider  HYDROcodone -acetaminophen  (NORCO) 10-325 MG tablet Take 1 tablet by mouth every 4 (four) hours as needed. 08/11/24  Yes [provider]  Accu-Chek Softclix Lancets lancets E11.9 USE TO CHECK GLUCOSE ONCE DAILY 04/28/22   [name], MD  amLODipine  (NORVASC ) 10 MG tablet Take 1 tablet (10 mg total) by mouth daily. 07/16/24   Grooms, Courtney, PA-C  celecoxib  (CELEBREX ) 100 MG capsule Take 100 mg by mouth daily. 04/30/24   [provider]  glucose blood (ACCU-CHEK GUIDE) test strip E11.9 USE 1 STRIP TO CHECK GLUCOSE ONCE DAILY 04/28/22   [name], MD  hydrOXYzine  (VISTARIL ) 25 MG capsule Take 1-2 capsule by mouth each evening to help with sleep 04/24/24   [name], MD  Melatonin 10 MG CAPS Take by mouth.    [provider]  metoprolol  tartrate (LOPRESSOR ) 25 MG tablet Take 1 tablet (25 mg total) by mouth 2 (two) times daily. 11/15/23   [name], MD  Multiple Vitamin (MULTI-VITAMIN DAILY PO) Take 1 tablet by  mouth daily.    [provider]  OVER THE COUNTER MEDICATION CBD gummies    [provider]    Allergies: Lyrica  [pregabalin ]    Review of Systems  Updated Vital Signs BP (!) 148/63   Pulse 82   Temp (!) 94.7 F (34.8 C) (Rectal)   Resp 15   Ht 5' 7 (1.702 m)   Wt 72.6 kg   SpO2 100%   BMI 25.07 kg/m   Physical Exam Vitals reviewed.  Pulmonary:     Comments: Harsh breath sounds with tachypnea.  Worse on the right. Musculoskeletal:     Cervical back: Neck supple.     Comments: Only mild edema on lower extremities.  Neurological:     Mental Status: She is alert.     Comments: Confused but answers some questions.     (all labs ordered are listed, but only abnormal results are displayed) Labs Reviewed  COMPREHENSIVE METABOLIC PANEL WITH GFR - Abnormal; Notable for the following components:      Result Value   Potassium 5.5 (*)    Chloride 97 (*)    Glucose, Bld 150 (*)    BUN 40 (*)    GFR, Estimated 58 (*)    All other components within normal limits  CBC WITH DIFFERENTIAL/PLATELET - Abnormal; Notable for the following components:  WBC 11.2 (*)    MCV 108.4 (*)    Neutro Abs 9.3 (*)    All other components within normal limits  LACTIC ACID, PLASMA - Abnormal; Notable for the following components:   Lactic Acid, Venous 2.0 (*)    All other components within normal limits  PRO BRAIN NATRIURETIC PEPTIDE - Abnormal; Notable for the following components:   Pro Brain Natriuretic Peptide 3,669.0 (*)    All other components within normal limits  ACETAMINOPHEN  LEVEL - Abnormal; Notable for the following components:   Acetaminophen  (Tylenol ), Serum <10 (*)    All other components within normal limits  CBG MONITORING, ED - Abnormal; Notable for the following components:   Glucose-Capillary 143 (*)    All other components within normal limits  TROPONIN T, HIGH SENSITIVITY - Abnormal; Notable for the following components:   Troponin T High Sensitivity  23 (*)    All other components within normal limits  RESP PANEL BY RT-PCR (RSV, FLU A&B, COVID)  RVPGX2  CULTURE, BLOOD (ROUTINE X 2)  CULTURE, BLOOD (ROUTINE X 2)  LACTIC ACID, PLASMA  TROPONIN T, HIGH SENSITIVITY    EKG: EKG Interpretation Date/Time:  Monday August 13 2024 17:04:17 EST Ventricular Rate:  76 PR Interval:  46 QRS Duration:  169 QT Interval:  428 QTC Calculation: 485 R Axis:   20  Text Interpretation: Sinus rhythm Short PR interval Nonspecific intraventricular conduction delay Confirmed by Patsey Lot [phone]) on 08/13/2024 5:24:42 PM  Radiology: DG Chest Portable 1 View Result Date: 08/13/2024 EXAM: 1 VIEW(S) XRAY OF THE CHEST 08/13/2024 05:21:00 PM COMPARISON: Comparison with 09/20/2022. CLINICAL HISTORY: SOB. FINDINGS: LUNGS AND PLEURA: Shallow inspiration. Elevation of the left hemidiaphragm. Interval development of airspace infiltrates in the right upper lung and right lower lung most likely representing pneumonia. Follow up to resolution is recommended to exclude an underlying obstructing lesion. Small bilateral pleural effusions. Probable emphysematous changes in the lungs. No pneumothorax. HEART AND MEDIASTINUM: Cardiac enlargement. Mediastinal contours appear intact. Calcification of the aorta. BONES AND SOFT TISSUES: Degenerative changes in the spine and shoulders. IMPRESSION: 1. Interval development of airspace infiltrates in the right upper lung and right lower lung, most likely representing pneumonia. Follow-up to resolution is recommended to exclude an underlying obstructing lesion. 2. Small bilateral pleural effusions. 3. Cardiac enlargement. 4. Probable emphysematous changes in the lungs. Electronically signed by: Elsie Gravely MD 08/13/2024 06:24 PM EST RP Workstation: [id number]     Procedures   Medications Ordered in the ED  cefTRIAXone  (ROCEPHIN ) 2 g in sodium chloride  0.9 % 100 mL IVPB (0 g Intravenous Stopped 08/13/24 1810)  azithromycin   (ZITHROMAX ) 500 mg in sodium chloride  0.9 % 250 mL IVPB (0 mg Intravenous Stopped 08/13/24 1837)  sodium chloride  0.9 % bolus 500 mL (0 mLs Intravenous Stopped 08/13/24 1839)  HYDROcodone -acetaminophen  (NORCO/VICODIN) 5-325 MG per tablet 2 tablet (2 tablets Oral Given 08/13/24 1817)  LORazepam  (ATIVAN ) injection 0.5 mg (0.5 mg Intravenous Given 08/13/24 1842)                                    Medical Decision Making Amount and/or Complexity of Data Reviewed Labs: ordered. Radiology: ordered.  Risk Prescription drug management. Decision regarding hospitalization.   Patient with hypoxia and mental status change.  Some hypothermia.  Differential diagnoses longed includes causes such as flu pneumonia medication effect.  Attempted off BiPAP and sats went down to the 70s.  Started back on BiPAP.  X-ray does show likely right sided infiltrate.  Given antibiotics.  Lactic acid mildly elevated.  However proBNP also elevated as troponin.  May be secondary to the hypoxia that she had.  Is on BiPAP.  Mental status improving.  Negative viral testing.  Will discuss with hospitalist for admission.  CRITICAL CARE Performed by: [place] Total critical care time: 30 minutes Critical care time was exclusive of separately billable procedures and treating other patients. Critical care was necessary to treat or prevent imminent or life-threatening deterioration. Critical care was time spent personally by me on the following activities: development of treatment plan with patient and/or surrogate as well as nursing, discussions with consultants, evaluation of patient's response to treatment, examination of patient, obtaining history from patient or surrogate, ordering and performing treatments and interventions, ordering and review of laboratory studies, ordering and review of radiographic studies, pulse oximetry and re-evaluation of patient's condition.       Final diagnoses:  Community acquired  pneumonia, unspecified laterality    ED Discharge Orders     None          River Rankin, MD 08/13/24 1934  "

## 2024-08-13 NOTE — H&P (Addendum)
 " History and Physical  Deanna Salinas FMW:[id number] DOB: [birth year] DOA: 08/13/2024  Referring physician: Dr. Patsey, EDP  PCP: [name], MD  Outpatient Specialists: None. Patient coming from: Home.  Chief Complaint: Respiratory distress, minimally responsive  HPI: Deanna Salinas is a 89 y.o. female with medical history significant for chronic pain syndrome, chronic insomnia, essential hypertension, hyperlipidemia, type 2 diabetes, prediabetes, hearing impairment, chronic HFpEF, who presents to the ER via EMS with respiratory distress after being found minimally responsive at home by family.    Reportedly, the patient may have accidentally overdosed with home hydrocodone .  Per her daughter at bedside, her home tramadol  was changed to hydrocodone  a week ago, because tramadol  was no longer effective in controlling her generalized pain.  She had her chronic pain pills refilled 2 days ago.    For the past 2 days she has been more somnolent.  Had a mild cough.  No reports of fevers or chills.  EMS was activated.  Upon EMS arrival, the patient had O2 saturation in the 40s, per EMS.  She was initially placed on nonrebreather mask then switched to CPAP en route and brought into the ER for further evaluation.  In the ER, O2 saturation in the 70s, BiPAP was applied.  Chest x-ray revealed diffuse infiltrates bilaterally, right greater than left.  She received Rocephin  and IV azithromycin  for community-acquired pneumonia and possible aspiration pneumonia.  TR[facility], hospitalist service, was asked admit.  To note, the patient did not receive Narcan and is currently more alert.  ED Course: Temperature 96.2.  BP 114/64, pulse 79, respiratory 21, saturation 100% on room BiPAP with FiO2 of 80%.  Review of Systems: Review of systems as noted in the HPI. All other systems reviewed and are negative.   Past Medical History:  Diagnosis Date   Deaf    Essential hypertension    Fatty liver     Hyperlipidemia    Type 2 diabetes mellitus (HCC)    Past Surgical History:  Procedure Laterality Date   ABDOMINAL HYSTERECTOMY     APPENDECTOMY     At the time of hysterectomy   CESAREAN SECTION     X5   COLONOSCOPY     COLONOSCOPY N/A 06/11/2013   Colonic diverticulosis, hyperplastic polyps. no further screening colonoscopies recommended   ESOPHAGOGASTRODUODENOSCOPY N/A 02/20/2014   Procedure: ESOPHAGOGASTRODUODENOSCOPY (EGD);  Surgeon: Lamar CHRISTELLA Hollingshead, MD;  Location: [facility];  Service: Endoscopy;  Laterality: N/A;  11:00    Social History:  reports that she has never smoked. She has never been exposed to tobacco smoke. She has never used smokeless tobacco. She reports current alcohol use of about 3.0 standard drinks of alcohol per week. She reports that she does not use drugs.   Allergies[1]  Family History  Problem Relation Age of Onset   Cancer Mother    Heart Problems Father    Congestive Heart Failure Father    Cancer Daughter        breast    Hypertension Other    Hypertension Other    Colon cancer Neg Hx       Prior to Admission medications  Medication Sig Start Date End Date Taking? Authorizing Provider  HYDROcodone -acetaminophen  (NORCO) 10-325 MG tablet Take 1 tablet by mouth every 4 (four) hours as needed. 08/11/24  Yes [provider]  Accu-Chek Softclix Lancets lancets E11.9 USE TO CHECK GLUCOSE ONCE DAILY 04/28/22   [name], MD  amLODipine  (NORVASC ) 10 MG tablet  Take 1 tablet (10 mg total) by mouth daily. 07/16/24   Grooms, Courtney, PA-C  celecoxib  (CELEBREX ) 100 MG capsule Take 100 mg by mouth daily. 04/30/24   [provider]  glucose blood (ACCU-CHEK GUIDE) test strip E11.9 USE 1 STRIP TO CHECK GLUCOSE ONCE DAILY 04/28/22   [name], MD  hydrOXYzine  (VISTARIL ) 25 MG capsule Take 1-2 capsule by mouth each evening to help with sleep 04/24/24   [name], MD  Melatonin 10 MG CAPS Take by mouth.    [provider]   metoprolol  tartrate (LOPRESSOR ) 25 MG tablet Take 1 tablet (25 mg total) by mouth 2 (two) times daily. 11/15/23   [name], MD  Multiple Vitamin (MULTI-VITAMIN DAILY PO) Take 1 tablet by mouth daily.    [provider]  OVER THE COUNTER MEDICATION CBD gummies    [provider]    Physical Exam: BP 114/64   Pulse 79   Temp (!) 94.7 F (34.8 C) (Rectal)   Resp (!) 21   Ht 5' 7 (1.702 m)   Wt 72.6 kg   SpO2 100%   BMI 25.07 kg/m   General: 89 y.o. year-old female well developed well nourished in no acute distress.  Alert and oriented x3.  Has hearing impairment. Cardiovascular: Regular rate and rhythm with no rubs or gallops.  No thyromegaly or JVD noted.  1+ pitting edema in lower extremities bilaterally. Respiratory: On BiPAP.  Diffuse rales bilaterally.  Poor inspiratory effort. Abdomen: Soft nontender nondistended with normal bowel sounds x4 quadrants. Muskuloskeletal: No cyanosis or clubbing noted bilaterally Neuro: CN II-XII intact, strength, sensation, reflexes Skin: No ulcerative lesions noted or rashes Psychiatry: Judgement and insight appear normal. Mood is appropriate for condition and setting          Labs on Admission:  Basic Metabolic Panel: Recent Labs  Lab 08/13/24 1713  NA 136  K 5.5*  CL 97*  CO2 23  GLUCOSE 150*  BUN 40*  CREATININE 0.95  CALCIUM  9.3   Liver Function Tests: Recent Labs  Lab 08/13/24 1713  AST 25  ALT 15  ALKPHOS 110  BILITOT 0.4  PROT 7.2  ALBUMIN 4.3   No results for input(s): LIPASE, AMYLASE in the last 168 hours. No results for input(s): AMMONIA in the last 168 hours. CBC: Recent Labs  Lab 08/13/24 1713  WBC 11.2*  NEUTROABS 9.3*  HGB 13.5  HCT 43.8  MCV 108.4*  PLT 288   Cardiac Enzymes: No results for input(s): CKTOTAL, CKMB, CKMBINDEX, TROPONINI in the last 168 hours.  BNP (last 3 results) No results for input(s): BNP in the last 8760 hours.  ProBNP (last 3  results) Recent Labs    08/13/24 1713  PROBNP 3,669.0*    CBG: Recent Labs  Lab 08/13/24 1743  GLUCAP 143*    Radiological Exams on Admission: DG Chest Portable 1 View Result Date: 08/13/2024 EXAM: 1 VIEW(S) XRAY OF THE CHEST 08/13/2024 05:21:00 PM COMPARISON: Comparison with 09/20/2022. CLINICAL HISTORY: SOB. FINDINGS: LUNGS AND PLEURA: Shallow inspiration. Elevation of the left hemidiaphragm. Interval development of airspace infiltrates in the right upper lung and right lower lung most likely representing pneumonia. Follow up to resolution is recommended to exclude an underlying obstructing lesion. Small bilateral pleural effusions. Probable emphysematous changes in the lungs. No pneumothorax. HEART AND MEDIASTINUM: Cardiac enlargement. Mediastinal contours appear intact. Calcification of the aorta. BONES AND SOFT TISSUES: Degenerative changes in the spine and shoulders. IMPRESSION: 1. Interval development of airspace  infiltrates in the right upper lung and right lower lung, most likely representing pneumonia. Follow-up to resolution is recommended to exclude an underlying obstructing lesion. 2. Small bilateral pleural effusions. 3. Cardiac enlargement. 4. Probable emphysematous changes in the lungs. Electronically signed by: Elsie Gravely MD 08/13/2024 06:24 PM EST RP Workstation: [id number]    EKG: I independently viewed the EKG done and my findings are as followed: Sinus rhythm rate of 76.  QTc 485.  Assessment/Plan Present on Admission:  Acute hypoxemic respiratory failure (HCC)  Principal Problem:   Acute hypoxemic respiratory failure (HCC)  Acute hypoxic respiratory failure secondary to multifocal community-acquired pneumonia, possible aspiration pneumonia, POA Not on oxygen  supplementation at baseline Severely hypoxic with O2 saturation in the 40s per EMS Currently on BiPAP to maintain O2 saturation above 92% Wean off BiPAP as tolerated Continue to treat underlying  conditions Early mobilization when off BiPAP  Multifocal community-acquired pneumonia, possible aspiration pneumonia, POA Continue Rocephin  and IV azithromycin , initiated in the ER. Monitor fever curve and WBCs Bronchodilator nebulizers As needed antitussives  Elevated troponin, suspect demand ischemia in the setting of the above Elevated proBNP 3669 Acute on chronic HFpEF Mild volume overload on exam with 1+ pitting edema in lower extremities bilaterally. High-sensitivity troponin 23, 26 No evidence of acute ischemia on twelve-lead EKG. Follow transthoracic echocardiogram. IV Lasix  20 mg x 1, as blood pressure allows Monitor strict I's and O's and daily weight  Hyperkalemia Serum potassium 5.5, no peaked T waves on 12 EKG IV Lasix  20 mg x 1 Repeat BMP in the morning  Chronic pain syndrome Recently switched from tramadol  to hydrocodone  Recommend referral to pain clinic at discharge. Pain control with precautions As needed bowel regimen  Hypertension BPs are at goal 114/64 Continue to closely monitor vital signs.  Chronic insomnia On hydroxyzine  as needed and melatonin as needed  Generalized weakness PT OT evaluation Fall precautions  Goals of care Patient is currently DNR/DNI Palliative care medicine consulted to assist with establishing goals of care.   Critical care time: 55 minutes.   DVT prophylaxis: Subcu Lovenox  daily  Code Status: DNR/DNI.  Family Communication: Daughter at bedside.  Disposition Plan: Admitted to stepdown unit.  Consults called: Palliative care medicine.  Admission status: Inpatient status.   Status is: Inpatient The patient requires at least 2 midnights for further evaluation and treatment of present condition.   Terry LOISE Hurst MD [facility] Pager [phone]  If 7PM-7AM, please contact night-coverage [email or web address] Password [facility]  08/13/2024, 7:48 PM      [1]  Allergies Allergen Reactions   Lyrica   [Pregabalin ]     Itching and insomnia   "

## 2024-08-13 NOTE — ED Notes (Signed)
 Date and time results received: 08/13/2024 1904  Test: Lactic Acid Critical Value: 2.0  Name of Provider Notified: Picking, Rankin, MD

## 2024-08-13 NOTE — ED Triage Notes (Signed)
 Pt arrived via [facility] frm home c/o respiratory distress. Per EMS, Pt may have taken too many hydrocodones and was found with decreased mental status and O2 Sats in the 40's on room air. Upon arrival, RT at bedside and Pt trialed on room air, but O2 Sats dropped to low 70's. Pt placed on BiPap per EDP request.

## 2024-08-13 NOTE — Progress Notes (Signed)
 Patient transported from ED 2 to ICU 4 on BIPAP. No adverse events noted and patient tolerated trip well. Report given to RT covering the ICU.

## 2024-08-14 ENCOUNTER — Inpatient Hospital Stay (HOSPITAL_COMMUNITY)

## 2024-08-14 ENCOUNTER — Other Ambulatory Visit: Payer: Self-pay | Admitting: Family Medicine

## 2024-08-14 DIAGNOSIS — J189 Pneumonia, unspecified organism: Secondary | ICD-10-CM

## 2024-08-14 DIAGNOSIS — G9340 Encephalopathy, unspecified: Secondary | ICD-10-CM

## 2024-08-14 DIAGNOSIS — Z515 Encounter for palliative care: Secondary | ICD-10-CM | POA: Diagnosis not present

## 2024-08-14 DIAGNOSIS — E875 Hyperkalemia: Secondary | ICD-10-CM | POA: Diagnosis not present

## 2024-08-14 DIAGNOSIS — Z7189 Other specified counseling: Secondary | ICD-10-CM

## 2024-08-14 DIAGNOSIS — J9601 Acute respiratory failure with hypoxia: Secondary | ICD-10-CM | POA: Diagnosis not present

## 2024-08-14 LAB — BLOOD GAS, VENOUS
Acid-Base Excess: 4.1 mmol/L — ABNORMAL HIGH (ref 0.0–2.0)
Bicarbonate: 31.3 mmol/L — ABNORMAL HIGH (ref 20.0–28.0)
Drawn by: 53361
O2 Saturation: 97 %
Patient temperature: 36.6
pCO2, Ven: 57 mmHg (ref 44–60)
pH, Ven: 7.35 (ref 7.25–7.43)
pO2, Ven: 78 mmHg — ABNORMAL HIGH (ref 32–45)

## 2024-08-14 LAB — BASIC METABOLIC PANEL WITH GFR
Anion gap: 8 (ref 5–15)
BUN: 40 mg/dL — ABNORMAL HIGH (ref 8–23)
CO2: 31 mmol/L (ref 22–32)
Calcium: 8.9 mg/dL (ref 8.9–10.3)
Chloride: 99 mmol/L (ref 98–111)
Creatinine, Ser: 1.15 mg/dL — ABNORMAL HIGH (ref 0.44–1.00)
GFR, Estimated: 46 mL/min — ABNORMAL LOW
Glucose, Bld: 117 mg/dL — ABNORMAL HIGH (ref 70–99)
Potassium: 5 mmol/L (ref 3.5–5.1)
Sodium: 138 mmol/L (ref 135–145)

## 2024-08-14 LAB — GLUCOSE, CAPILLARY
Glucose-Capillary: 91 mg/dL (ref 70–99)
Glucose-Capillary: 110 mg/dL — ABNORMAL HIGH (ref 70–99)
Glucose-Capillary: 130 mg/dL — ABNORMAL HIGH (ref 70–99)
Glucose-Capillary: 134 mg/dL — ABNORMAL HIGH (ref 70–99)
Glucose-Capillary: 66 mg/dL — ABNORMAL LOW (ref 70–99)
Glucose-Capillary: 116 mg/dL — ABNORMAL HIGH (ref 70–99)
Glucose-Capillary: 84 mg/dL (ref 70–99)
Glucose-Capillary: 101 mg/dL — ABNORMAL HIGH (ref 70–99)

## 2024-08-14 LAB — URINE DRUG SCREEN
Amphetamines: NEGATIVE
Barbiturates: NEGATIVE
Benzodiazepines: POSITIVE — AB
Cocaine: NEGATIVE
Fentanyl: NEGATIVE
Methadone Scn, Ur: NEGATIVE
Opiates: POSITIVE — AB
Tetrahydrocannabinol: NEGATIVE

## 2024-08-14 LAB — AMMONIA: Ammonia: 34 umol/L (ref 9–35)

## 2024-08-14 LAB — MRSA NEXT GEN BY PCR, NASAL: MRSA by PCR Next Gen: NOT DETECTED

## 2024-08-14 MED ORDER — KETOROLAC TROMETHAMINE 15 MG/ML IJ SOLN
15.0000 mg | Freq: Four times a day (QID) | INTRAMUSCULAR | Status: DC
  Administered 2024-08-14 – 2024-08-16 (×7): 15 mg via INTRAVENOUS
  Filled 2024-08-14 (×7): qty 1

## 2024-08-14 MED ORDER — FENTANYL CITRATE (PF) 50 MCG/ML IJ SOSY
25.0000 ug | PREFILLED_SYRINGE | INTRAMUSCULAR | Status: DC | PRN
  Administered 2024-08-15 – 2024-08-16 (×6): 25 ug via INTRAVENOUS
  Filled 2024-08-14 (×6): qty 1

## 2024-08-14 MED ORDER — LORAZEPAM 2 MG/ML IJ SOLN
1.0000 mg | INTRAMUSCULAR | Status: DC | PRN
  Filled 2024-08-14: qty 1

## 2024-08-14 MED ORDER — KETOROLAC TROMETHAMINE 15 MG/ML IJ SOLN
15.0000 mg | Freq: Four times a day (QID) | INTRAMUSCULAR | Status: DC | PRN
  Administered 2024-08-14: 15 mg via INTRAVENOUS
  Filled 2024-08-14: qty 1

## 2024-08-14 MED ORDER — ACETAMINOPHEN 500 MG PO TABS: 1000.0000 mg | ORAL_TABLET | Freq: Three times a day (TID) | ORAL | Status: DC

## 2024-08-14 MED ORDER — DEXMEDETOMIDINE HCL IN NACL 400 MCG/100ML IV SOLN
0.0000 ug/kg/h | INTRAVENOUS | Status: DC
  Administered 2024-08-14: 1.2 ug/kg/h via INTRAVENOUS
  Administered 2024-08-14: 0.4 ug/kg/h via INTRAVENOUS
  Administered 2024-08-15: 0.8 ug/kg/h via INTRAVENOUS
  Administered 2024-08-15: 0.2 ug/kg/h via INTRAVENOUS
  Administered 2024-08-16: 1 ug/kg/h via INTRAVENOUS
  Filled 2024-08-14 (×7): qty 100

## 2024-08-14 MED ORDER — DEXTROSE 50 % IV SOLN
INTRAVENOUS | Status: AC
  Filled 2024-08-14: qty 50

## 2024-08-14 MED ORDER — DEXTROSE 50 % IV SOLN
12.5000 g | INTRAVENOUS | Status: AC
  Administered 2024-08-14: 12.5 g via INTRAVENOUS

## 2024-08-14 MED ORDER — LACTATED RINGERS IV SOLN: INTRAVENOUS | Status: DC

## 2024-08-14 NOTE — Progress Notes (Signed)
 Took patient off Bipap, ABG had improved and patient pH back to normal.  Placed patient on HHFNC to continue to help with oxygenation and CO2 wash out.  Patient is currently on 15L flow and 50% FIO2; patient sat running between 95 and 96%.  Patient seems to be tolerating much better.

## 2024-08-14 NOTE — Progress Notes (Signed)
 Transition of Care Department [facility]) has reviewed patient and no other TOC needs have been identified at this time. We will continue to monitor patient advancement through interdisciplinary progression rounds. If new patient transition needs arise, please place a TOC consult.   08/14/24 0734  TOC Brief Assessment  Insurance and Status Reviewed  Patient has primary care physician Yes  Home environment has been reviewed Lives with husband.  Prior level of function: Husband assists.  Prior/Current Home Services No current home services  Social Drivers of Health Review SDOH reviewed no interventions necessary  Readmission risk has been reviewed Yes  Transition of care needs no transition of care needs at this time

## 2024-08-14 NOTE — Progress Notes (Signed)
 Hypoglycemic Event  CBG: 66  Treatment: D50 25 mL (12.5 gm)  Symptoms: Pale  Follow-up CBG: Time:1941 CBG Result:116  Possible Reasons for Event: Inadequate meal intake  Comments/MD notified:Hall MD    Deanna Salinas

## 2024-08-14 NOTE — Progress Notes (Addendum)
 Per cardiovascular sonographer, they will be unable to complete echocardiogram due to patient's AMS and agitation. Patient is currently maxed on precedex  and in bilateral wrist restraints. Dr. Vicci has been made aware.

## 2024-08-14 NOTE — Consult Note (Signed)
 "                                                                                   Consultation Note Date: 08/14/2024   Patient Name: Deanna Salinas  DOB: 10-Oct-1935  MRN: [id number]  Age / Sex: 89 y.o., female  PCP: [name], MD Referring Physician: Vicci Afton CROME, MD  Reason for Consultation: Establishing goals of care  HPI/Patient Profile: 89 y.o. female  with past medical history of HFpEF, HTN, HLD, T2DM, hearing impairment, chronic pain syndrome, chronic insomnia admitted on 08/13/2024 with found minimally responsive at home by family with concern for multifocal community-acquired pneumonia requiring BiPAP. She has chronic pain and has recent prescription for hydrocodone  - concern she may have accidentally taken too many. Reviewed PDMP and noted increase in hydrocodone  from 5 mg to 10 mg filled on 08/11/24. 08/14/24 progressed to HHFNC 20L 70% FiO2.   Clinical Assessment and Goals of Care: Consult received and chart review completed. I met today at Deanna Salinas's bedside with NT sitter and daughter, Karolyn, at bedside. Deanna Salinas is severely agitated and restless. She is moaning and complaining of pain and asking for help. She is thrashing in bed - NT attempting to calm her. She does answer and state some accurate information. She tells us  she has 13 grandchildren - Karolyn confirms this is accurate. Difficult to focus Deanna Salinas in conversation and difficult to console her.   Karolyn and I discuss her mother's baseline. She has had chronic pain from bone on bone knee pain and osteoarthritis with generalized pain for many years managed with tramadol . Her pain has significantly worsened over the past 1 year. She is mostly in the bed but is able to get herself to commode and bath herself in shower with seat in shower. Her 31 year old husband is her main caregiver. They have 5 children but none live locally - Karolyn is in [place], another in [place], and another in [place]. Mr. Pendergraft mostly  manages her medications but they were within reach for her. Karolyn shares that they worry her mother had tramadol , hydrocodone , as well as some gin that was within her reach. They mix golden raisins and gin as an old remedy for arthritis. Karolyn shares that her mother does have occasional alcohol but not since taking hydrocodone  and this was a very small amount even prior. She shares that her mother at baseline has a very sharp mind with no concerns for forgetfulness or confusion noted.   DNR/DNI has been confirmed and consistent with Living Will directive. Family are hopeful for improvement but understand this may take some time. There will need to be further investigation and recommendations for pain management if/when she improves. Time for outcomes.   Primary Decision Maker NEXT OF KIN husband along with adult children    SUMMARY OF RECOMMENDATIONS   - Time for outcomes  Code Status/Advance Care Planning: DNR   Symptom Management:  Agitation: Agree with Precedex  infusion Acute on chronic pain: Tylenol  1000 q8h Toradol  15 mg q6h  Consider fentanyl  12.5-25 mcg IV q2h PRN - this may help with agitation too  Prognosis:  Unable to determine  Discharge Planning: To Be Determined      Primary Diagnoses: Present on Admission:  Acute hypoxemic respiratory failure (HCC)   I have reviewed the medical record, interviewed the patient and family, and examined the patient. The following aspects are pertinent.  Past Medical History:  Diagnosis Date   Deaf    Essential hypertension    Fatty liver    Hyperlipidemia    Type 2 diabetes mellitus (HCC)    Social History   Socioeconomic History   Marital status: Married    Spouse name: Not on file   Number of children: 5   Years of education: Not on file   Highest education level: Not on file  Occupational History    Employer: RETIRED  Tobacco Use   Smoking status: Never    Passive exposure: Never   Smokeless tobacco: Never   Vaping Use   Vaping status: Never Used  Substance and Sexual Activity   Alcohol use: Yes    Alcohol/week: 3.0 standard drinks of alcohol    Types: 3 Glasses of wine per week    Comment: occasional   Drug use: No   Sexual activity: Not Currently  Other Topics Concern   Not on file  Social History Narrative   Not on file   Social Drivers of Health   Tobacco Use: Low Risk (08/13/2024)   Patient History    Smoking Tobacco Use: Never    Smokeless Tobacco Use: Never    Passive Exposure: Never  Financial Resource Strain: Low Risk (10/23/2023)   Received from [facility] [place]    Overall Financial Resource Strain (CARDIA)    Difficulty of Paying Living Expenses: Not hard at all  Food Insecurity: No Food Insecurity (08/13/2024)   Epic    Worried About Radiation Protection Practitioner of Food in the Last Year: Never true    Ran Out of Food in the Last Year: Never true  Transportation Needs: No Transportation Needs (08/13/2024)   Epic    Lack of Transportation (Medical): No    Lack of Transportation (Non-Medical): No  Physical Activity: Inactive (10/23/2023)   Received from [facility] [place]    Exercise Vital Sign    On average, how many days per week do you engage in moderate to strenuous exercise (like a brisk walk)?: 0 days    On average, how many minutes do you engage in exercise at this level?: 0 min  Stress: Stress Concern Present (10/23/2023)   Received from [facility] [place]    [employer] of Occupational Health - Occupational Stress Questionnaire    Feeling of Stress : To some extent  Social Connections: Moderately Isolated (08/13/2024)   Social Connection and Isolation Panel    Frequency of Communication with Friends and Family: More than three times a week    Frequency of Social Gatherings with Friends and Family: Never    Attends Religious Services: Never    Database Administrator or Organizations: No    Attends Banker Meetings:  Never    Marital Status: Married  Depression (PHQ2-9): High Risk (07/16/2024)   Depression (PHQ2-9)    PHQ-2 Score: 18  Alcohol Screen: Low Risk (05/20/2022)   Alcohol Screen    Last Alcohol Screening Score (AUDIT): 4  Housing: Low Risk (08/13/2024)   Epic    Unable to Pay for Housing in the Last Year: No    Number of Times Moved in the Last Year: 0    Homeless in the  Last Year: No  Utilities: Not At Risk (08/13/2024)   Epic    Threatened with loss of utilities: No  Health Literacy: Not on file   Family History  Problem Relation Age of Onset   Cancer Mother    Heart Problems Father    Congestive Heart Failure Father    Cancer Daughter        breast    Hypertension Other    Hypertension Other    Colon cancer Neg Hx    Scheduled Meds:  Chlorhexidine  Gluconate Cloth  6 each Topical Daily   enoxaparin  (LOVENOX ) injection  40 mg Subcutaneous QHS   insulin  aspart  0-9 Units Subcutaneous Q4H   ketorolac        Continuous Infusions:  azithromycin      cefTRIAXone  (ROCEPHIN )  IV     PRN Meds:.acetaminophen , hydrOXYzine , ketorolac , melatonin, polyethylene glycol, prochlorperazine  Allergies[1] Review of Systems  Unable to perform ROS: Acuity of condition    Physical Exam Vitals and nursing note reviewed.  Constitutional:      General: She is awake. She is in acute distress.     Appearance: Normal appearance. She is well-developed and well-groomed.  Cardiovascular:     Rate and Rhythm: Normal rate.  Pulmonary:     Effort: No tachypnea, accessory muscle usage or respiratory distress.     Comments: HHFNC Abdominal:     Palpations: Abdomen is soft.  Neurological:     Mental Status: She is disoriented and confused.  Psychiatric:     Comments: Severe agitation     Vital Signs: BP 136/75   Pulse 90   Temp 97.7 F (36.5 C) (Axillary)   Resp 16   Ht 5' 9 (1.753 m)   Wt 76.1 kg   SpO2 94%   BMI 24.78 kg/m  Pain Scale: Faces   Pain Score: 2    SpO2: SpO2: 94 % O2  Device:SpO2: 94 % O2 Flow Rate: .O2 Flow Rate (L/min): (S) 15 L/min  IO: Intake/output summary:  Intake/Output Summary (Last 24 hours) at 08/14/2024 0842 Last data filed at 08/14/2024 0400 Gross per 24 hour  Intake --  Output 250 ml  Net -250 ml    LBM: Last BM Date : 08/13/24 Baseline Weight: Weight: 72.6 kg Most recent weight: Weight: 76.1 kg       Time Total: 80 min  Greater than 50%  of this time was spent counseling and coordinating care related to the above assessment and plan.  Signed by: [name], NP Palliative Medicine Team Pager # [phone] (M-F 8a-5p) Team Phone # [phone] (Nights/Weekends)                     [1]  Allergies Allergen Reactions   Lyrica  [Pregabalin ]     Itching and insomnia   "

## 2024-08-14 NOTE — Progress Notes (Signed)
 PT IN RESTRAINTS AND COMBATIVE.  WAITING ON DR TO DECIDE IF SEDATION IS POSSIBLE.                              NATHANEL DEVONSHIRE, RDCS

## 2024-08-14 NOTE — Hospital Course (Addendum)
 89 y.o. female with medical history significant for chronic pain syndrome, chronic insomnia, essential hypertension, hyperlipidemia, type 2 diabetes,  hearing impairment, chronic HFpEF, who presents to the ER via EMS with respiratory distress after being found minimally responsive at home by family.     Reportedly, the patient may have accidentally overdosed with home hydrocodone .  Per her daughter at bedside, her home tramadol  was changed to hydrocodone  a week ago, because tramadol  was no longer effective in controlling her generalized pain.  She had her chronic pain pills refilled 2 days ago.     For the past 2 days PTA she has been more somnolent.  Had a mild cough.  No reports of fevers or chills.  EMS was activated.  Upon EMS arrival, the patient had O2 saturation in the 40s, per EMS.  She was initially placed on nonrebreather mask then switched to CPAP en route and brought into the ER for further evaluation.   In the ER, O2 saturation in the 70s, BiPAP was applied.  Chest x-ray revealed diffuse infiltrates bilaterally, right greater than left.  She received Rocephin  and IV azithromycin  for community-acquired pneumonia and possible aspiration pneumonia.  TRH, hospitalist service, was asked admit.   Of note, the patient did not receive Narcan and is currently more alert.   ED Course: Temperature 96.2.  BP 114/64, pulse 79, respiratory 21, saturation 100% on room BiPAP with FiO2 of 80%.  The patient was transitioned to [facility] at 20L @ 70% FiO2.  She was started on IV abx.  Palliative medicine was consulted to discuss GOC with family.  The patient was initially very agitated requiring Precedex  infusion.  Gradually, the Precedex  has been weaned as the patient's agitation improved. She was noted to be fluid overloaded.  IV fluids were stopped and the patient was started on IV furosemide .

## 2024-08-14 NOTE — Progress Notes (Signed)
 " PROGRESS NOTE   Deanna Salinas  FMW:[id number] DOB: 1936/04/28 DOA: 08/13/2024 PCP: [name], MD   Chief Complaint  Patient presents with   Respiratory Distress   Level of care: ICU  Brief Admission History:  89 y.o. female with medical history significant for chronic pain syndrome, chronic insomnia, essential hypertension, hyperlipidemia, type 2 diabetes, prediabetes, hearing impairment, chronic HFpEF, who presents to the ER via EMS with respiratory distress after being found minimally responsive at home by family.     Reportedly, the patient may have accidentally overdosed with home hydrocodone .  Per her daughter at bedside, her home tramadol  was changed to hydrocodone  a week ago, because tramadol  was no longer effective in controlling her generalized pain.  She had her chronic pain pills refilled 2 days ago.     For the past 2 days she has been more somnolent.  Had a mild cough.  No reports of fevers or chills.  EMS was activated.  Upon EMS arrival, the patient had O2 saturation in the 40s, per EMS.  She was initially placed on nonrebreather mask then switched to CPAP en route and brought into the ER for further evaluation.   In the ER, O2 saturation in the 70s, BiPAP was applied.  Chest x-ray revealed diffuse infiltrates bilaterally, right greater than left.  She received Rocephin  and IV azithromycin  for community-acquired pneumonia and possible aspiration pneumonia.  [facility], hospitalist service, was asked admit.   To note, the patient did not receive Narcan and is currently more alert.   ED Course: Temperature 96.2.  BP 114/64, pulse 79, respiratory 21, saturation 100% on room BiPAP with FiO2 of 80%.   Assessment and Plan:  Acute severe Encephalopathy of undetermined cause -- Pt is severely agitated and delirious and required sedation in ICU with IV precedex  infusion  -- added IV lorazepam  PRN for severe outbursts -- I worry that she had a stroke.  CT was not ordered on  admission.  CT scan ordered if able to get done in current state, would also get an MRI if she improves to the point to be able to tolerate the test.   -- adding neuro checks -- long discussion with daughter who came down from [place], she says that her 17 year old father is patient's primary caretaker -- appreciate palliative medicine team consult and recommendations and agree may need to add IV fentanyl  in case this is opioid withdrawal. We are not certain what she is actually taking at home.  I could not reach 45 year old husband.   -- add urine toxicology screen  -- add neuro checks  -- check ammonia   Acute hypoxic respiratory failure secondary to multifocal community-acquired pneumonia, possible aspiration pneumonia Not on oxygen  supplementation at baseline Severely hypoxic with O2 saturation in the 40s per EMS Weaned off BiPAP BiPAP order changed to PRN   Multifocal community-acquired pneumonia, possible aspiration pneumonia Continue Rocephin  and IV azithromycin  Monitor fever curve and WBCs Bronchodilator nebulizers As needed antitussives   Elevated troponin, suspect demand ischemia in the setting of the above Elevated proBNP 3669 Chronic HFpEF High-sensitivity troponin 23, 26 No evidence of acute ischemia on twelve-lead EKG. Follow transthoracic echocardiogram. Monitor strict I's and O's and daily weight   Hyperkalemia--RESOLVED  --resolved after medical treatments    Chronic pain syndrome Recently switched from tramadol  to hydrocodone  Recommend referral to pain clinic at discharge. Pain control with precautions As needed bowel regimen   Hypertension BPs are at goal 114/64 Continue  to closely monitor vital signs.   Chronic insomnia On hydroxyzine  as needed and melatonin as needed   Generalized weakness PT OT evaluation Fall precautions   Goals of care Patient is currently DNR/DNI Palliative care medicine consulted to assist with establishing goals of  care.  DVT prophylaxis: enoxaparin   Code Status: DNR/DNI  Family Communication: daughter at bedside  Disposition: TBD   Consultants:   Procedures:   Antimicrobials:    Subjective: Pt severely encephalopathic.    Objective: Vitals:   08/14/24 1200 08/14/24 1210 08/14/24 1300 08/14/24 1426  BP: (!) 115/54 (!) 115/54 115/62   Pulse: 80 79 75   Resp: (!) 26 15 19    Temp:  97.7 F (36.5 C)    TempSrc:  Axillary    SpO2: 97% 97% 100% 100%  Weight:      Height:        Intake/Output Summary (Last 24 hours) at 08/14/2024 1453 Last data filed at 08/14/2024 1412 Gross per 24 hour  Intake 366.06 ml  Output 550 ml  Net -183.94 ml   Filed Weights   08/13/24 1715 08/13/24 2103 08/13/24 2110  Weight: 72.6 kg 76.1 kg 76.1 kg   Examination:  General exam: Appears severely encephalopathic  Respiratory system: bibasilar rales.  Mild tachypnea.  Cardiovascular system: normal S1 & S2 heard. No JVD, murmurs, rubs, gallops or clicks. Trace pedal edema. Gastrointestinal system: Abdomen is nondistended, soft and nontender. No organomegaly or masses felt. Normal bowel sounds heard. Central nervous system: severe encephalopathy.  Moving all extremities spontaneously. . Extremities: Symmetric 5 x 5 power. Skin: No rashes, lesions or ulcers. Psychiatry: unable to determine due to severe encephalopathy.   Data Reviewed: I have personally reviewed following labs and imaging studies  CBC: Recent Labs  Lab 08/13/24 1713  WBC 11.2*  NEUTROABS 9.3*  HGB 13.5  HCT 43.8  MCV 108.4*  PLT 288    Basic Metabolic Panel: Recent Labs  Lab 08/13/24 1713 08/14/24 1001  NA 136 138  K 5.5* 5.0  CL 97* 99  CO2 23 31  GLUCOSE 150* 117*  BUN 40* 40*  CREATININE 0.95 1.15*  CALCIUM  9.3 8.9    CBG: Recent Labs  Lab 08/13/24 1743 08/13/24 2257 08/14/24 0444 08/14/24 0743 08/14/24 1157  GLUCAP 143* 124* 91 110* 130*    Recent Results (from the past 240 hours)  Culture, blood  (routine x 2)     Status: None (Preliminary result)   Collection Time: 08/13/24  5:03 PM   Specimen: BLOOD  Result Value Ref Range Status   Specimen Description BLOOD  Final   Special Requests NONE  Final   Culture   Final    NO GROWTH < 24 HOURS Performed at [facility], [address] [place], [place] [address]    Report Status PENDING  Incomplete  Resp panel by RT-PCR (RSV, Flu A&B, Covid) Anterior Nasal Swab     Status: None   Collection Time: 08/13/24  5:18 PM   Specimen: Anterior Nasal Swab  Result Value Ref Range Status   SARS Coronavirus 2 by RT PCR NEGATIVE NEGATIVE Final    Comment: (NOTE) SARS-CoV-2 target nucleic acids are NOT DETECTED.  The SARS-CoV-2 RNA is generally detectable in upper respiratory specimens during the acute phase of infection. The lowest concentration of SARS-CoV-2 viral copies this assay can detect is 138 copies/mL. A negative result does not preclude SARS-Cov-2 infection and should not be used as the sole basis for treatment or other patient management  decisions. A negative result may occur with  improper specimen collection/handling, submission of specimen other than nasopharyngeal swab, presence of viral mutation(s) within the areas targeted by this assay, and inadequate number of viral copies(<138 copies/mL). A negative result must be combined with clinical observations, patient history, and epidemiological information. The expected result is Negative.  Fact Sheet for Patients:  bloggercourse.com  Fact Sheet for Healthcare Providers:  seriousbroker.it  This test is no t yet approved or cleared by the [place]  FDA and  has been authorized for detection and/or diagnosis of SARS-CoV-2 by FDA under an Emergency Use Authorization (EUA). This EUA will remain  in effect (meaning this test can be used) for the duration of the COVID-19 declaration under Section 564(b)(1) of the Act,  21 U.S.C.section 360bbb-3(b)(1), unless the authorization is terminated  or revoked sooner.       Influenza A by PCR NEGATIVE NEGATIVE Final   Influenza B by PCR NEGATIVE NEGATIVE Final    Comment: (NOTE) The Xpert Xpress SARS-CoV-2/FLU/RSV plus assay is intended as an aid in the diagnosis of influenza from Nasopharyngeal swab specimens and should not be used as a sole basis for treatment. Nasal washings and aspirates are unacceptable for Xpert Xpress SARS-CoV-2/FLU/RSV testing.  Fact Sheet for Patients: bloggercourse.com  Fact Sheet for Healthcare Providers: seriousbroker.it  This test is not yet approved or cleared by the [place]  FDA and has been authorized for detection and/or diagnosis of SARS-CoV-2 by FDA under an Emergency Use Authorization (EUA). This EUA will remain in effect (meaning this test can be used) for the duration of the COVID-19 declaration under Section 564(b)(1) of the Act, 21 U.S.C. section 360bbb-3(b)(1), unless the authorization is terminated or revoked.     Resp Syncytial Virus by PCR NEGATIVE NEGATIVE Final    Comment: (NOTE) Fact Sheet for Patients: bloggercourse.com  Fact Sheet for Healthcare Providers: seriousbroker.it  This test is not yet approved or cleared by the [place]  FDA and has been authorized for detection and/or diagnosis of SARS-CoV-2 by FDA under an Emergency Use Authorization (EUA). This EUA will remain in effect (meaning this test can be used) for the duration of the COVID-19 declaration under Section 564(b)(1) of the Act, 21 U.S.C. section 360bbb-3(b)(1), unless the authorization is terminated or revoked.  Performed at [facility], [address] [place], [place] [address]   Culture, blood (routine x 2)     Status: None (Preliminary result)   Collection Time: 08/13/24  5:30 PM   Specimen: BLOOD  Result Value Ref  Range Status   Specimen Description BLOOD RIGHT ANTECUBITAL  Final   Special Requests   Final    BOTTLES DRAWN AEROBIC AND ANAEROBIC Blood Culture adequate volume   Culture   Final    NO GROWTH < 24 HOURS Performed at [facility], [address] [place], [place] [address]    Report Status PENDING  Incomplete  MRSA Next Gen by PCR, Nasal     Status: None   Collection Time: 08/13/24  8:55 PM   Specimen: Nasal Mucosa; Nasal Swab  Result Value Ref Range Status   MRSA by PCR Next Gen NOT DETECTED NOT DETECTED Final    Comment: (NOTE) The GeneXpert MRSA Assay (FDA approved for NASAL specimens only), is one component of a comprehensive MRSA colonization surveillance program. It is not intended to diagnose MRSA infection nor to guide or monitor treatment for MRSA infections. Test performance is not FDA approved in patients less than 43 years old. Performed at  [facility], [address], [place], [place] [address]      Radiology Studies: DG Chest Portable 1 View Result Date: 08/13/2024 EXAM: 1 VIEW(S) XRAY OF THE CHEST 08/13/2024 05:21:00 PM COMPARISON: Comparison with 09/20/2022. CLINICAL HISTORY: SOB. FINDINGS: LUNGS AND PLEURA: Shallow inspiration. Elevation of the left hemidiaphragm. Interval development of airspace infiltrates in the right upper lung and right lower lung most likely representing pneumonia. Follow up to resolution is recommended to exclude an underlying obstructing lesion. Small bilateral pleural effusions. Probable emphysematous changes in the lungs. No pneumothorax. HEART AND MEDIASTINUM: Cardiac enlargement. Mediastinal contours appear intact. Calcification of the aorta. BONES AND SOFT TISSUES: Degenerative changes in the spine and shoulders. IMPRESSION: 1. Interval development of airspace infiltrates in the right upper lung and right lower lung, most likely representing pneumonia. Follow-up to resolution is recommended to exclude an underlying obstructing lesion. 2. Small  bilateral pleural effusions. 3. Cardiac enlargement. 4. Probable emphysematous changes in the lungs. Electronically signed by: Elsie Gravely MD 08/13/2024 06:24 PM EST RP Workstation: [id number]    Scheduled Meds:  acetaminophen   1,000 mg Oral Q8H   Chlorhexidine  Gluconate Cloth  6 each Topical Daily   enoxaparin  (LOVENOX ) injection  40 mg Subcutaneous QHS   insulin  aspart  0-9 Units Subcutaneous Q4H   ketorolac   15 mg Intravenous Q6H   Continuous Infusions:  azithromycin      cefTRIAXone  (ROCEPHIN )  IV     dexmedetomidine  (PRECEDEX ) IV infusion 1.2 mcg/kg/hr (08/14/24 1424)   lactated ringers  55 mL/hr at 08/14/24 1233     LOS: 1 day  Critical Care Procedure Note Authorized and Performed by: KYM Louder MD  Total Critical Care time:  70 mins Due to a high probability of clinically significant, life threatening deterioration, the patient required my highest level of preparedness to intervene emergently and I personally spent this critical care time directly and personally managing the patient.  This critical care time included obtaining a history; examining the patient, pulse oximetry; ordering and review of studies; arranging urgent treatment with development of a management plan; evaluation of patient's response of treatment; frequent reassessment; and discussions with other providers.  This critical care time was performed to assess and manage the high probability of imminent and life threatening deterioration that could result in multi-organ failure.  It was exclusive of separately billable procedures and treating other patients and teaching time.   Afton Louder, MD How to contact the [facility] Attending or Consulting provider 7A - 7P or covering provider during after hours 7P -7A, for this patient?  Check the care team in [facility] and look for a) attending/consulting [facility] provider listed and b) the [facility] team listed Log into [email or web address] to find provider on call.  Locate the [facility] provider you  are looking for under [facility] and page to a number that you can be directly reached. If you still have difficulty reaching the provider, please page the [facility] (Director on Call) for the Hospitalists listed on amion for assistance.  08/14/2024, 2:53 PM    "

## 2024-08-14 NOTE — Plan of Care (Signed)

## 2024-08-15 ENCOUNTER — Other Ambulatory Visit: Payer: Self-pay | Admitting: Family Medicine

## 2024-08-15 ENCOUNTER — Inpatient Hospital Stay (HOSPITAL_COMMUNITY)

## 2024-08-15 DIAGNOSIS — J9601 Acute respiratory failure with hypoxia: Secondary | ICD-10-CM | POA: Insufficient documentation

## 2024-08-15 DIAGNOSIS — I5031 Acute diastolic (congestive) heart failure: Secondary | ICD-10-CM

## 2024-08-15 DIAGNOSIS — F1120 Opioid dependence, uncomplicated: Secondary | ICD-10-CM | POA: Insufficient documentation

## 2024-08-15 DIAGNOSIS — A419 Sepsis, unspecified organism: Secondary | ICD-10-CM | POA: Insufficient documentation

## 2024-08-15 DIAGNOSIS — J9602 Acute respiratory failure with hypercapnia: Secondary | ICD-10-CM

## 2024-08-15 DIAGNOSIS — Z7189 Other specified counseling: Secondary | ICD-10-CM | POA: Diagnosis not present

## 2024-08-15 DIAGNOSIS — G894 Chronic pain syndrome: Secondary | ICD-10-CM

## 2024-08-15 DIAGNOSIS — J181 Lobar pneumonia, unspecified organism: Secondary | ICD-10-CM | POA: Insufficient documentation

## 2024-08-15 DIAGNOSIS — Z515 Encounter for palliative care: Secondary | ICD-10-CM | POA: Diagnosis not present

## 2024-08-15 LAB — ECHOCARDIOGRAM COMPLETE
AR max vel: 2.78 cm2
AV Area VTI: 2.54 cm2
AV Area mean vel: 2.49 cm2
AV Mean grad: 4 mmHg
AV Peak grad: 7 mmHg
Ao pk vel: 1.32 m/s
Area-P 1/2: 2.62 cm2
Calc EF: 64.3 %
Height: 69 in
S' Lateral: 3.2 cm
Single Plane A2C EF: 68.3 %
Single Plane A4C EF: 58.9 %
Weight: 2684.32 [oz_av]

## 2024-08-15 LAB — BASIC METABOLIC PANEL WITH GFR
Anion gap: 6 (ref 5–15)
BUN: 40 mg/dL — ABNORMAL HIGH (ref 8–23)
CO2: 31 mmol/L (ref 22–32)
Calcium: 8.6 mg/dL — ABNORMAL LOW (ref 8.9–10.3)
Chloride: 100 mmol/L (ref 98–111)
Creatinine, Ser: 0.78 mg/dL (ref 0.44–1.00)
GFR, Estimated: >60 mL/min
Glucose, Bld: 100 mg/dL — ABNORMAL HIGH (ref 70–99)
Potassium: 4.8 mmol/L (ref 3.5–5.1)
Sodium: 137 mmol/L (ref 135–145)

## 2024-08-15 LAB — GLUCOSE, CAPILLARY
Glucose-Capillary: 106 mg/dL — ABNORMAL HIGH (ref 70–99)
Glucose-Capillary: 108 mg/dL — ABNORMAL HIGH (ref 70–99)
Glucose-Capillary: 110 mg/dL — ABNORMAL HIGH (ref 70–99)
Glucose-Capillary: 113 mg/dL — ABNORMAL HIGH (ref 70–99)
Glucose-Capillary: 117 mg/dL — ABNORMAL HIGH (ref 70–99)
Glucose-Capillary: 129 mg/dL — ABNORMAL HIGH (ref 70–99)
Glucose-Capillary: 141 mg/dL — ABNORMAL HIGH (ref 70–99)

## 2024-08-15 LAB — CBC WITH DIFFERENTIAL/PLATELET
Abs Immature Granulocytes: 0.01 K/uL (ref 0.00–0.07)
Basophils Absolute: 0 K/uL (ref 0.0–0.1)
Basophils Relative: 0 %
Eosinophils Absolute: 0 K/uL (ref 0.0–0.5)
Eosinophils Relative: 1 %
HCT: 37.6 % (ref 36.0–46.0)
Hemoglobin: 12.2 g/dL (ref 12.0–15.0)
Immature Granulocytes: 0 %
Lymphocytes Relative: 12 %
Lymphs Abs: 0.8 K/uL (ref 0.7–4.0)
MCH: 34.4 pg — ABNORMAL HIGH (ref 26.0–34.0)
MCHC: 32.4 g/dL (ref 30.0–36.0)
MCV: 105.9 fL — ABNORMAL HIGH (ref 80.0–100.0)
Monocytes Absolute: 0.5 K/uL (ref 0.1–1.0)
Monocytes Relative: 7 %
Neutro Abs: 5.9 K/uL (ref 1.7–7.7)
Neutrophils Relative %: 80 %
Platelets: 207 K/uL (ref 150–400)
RBC: 3.55 MIL/uL — ABNORMAL LOW (ref 3.87–5.11)
RDW: 13.2 % (ref 11.5–15.5)
WBC: 7.3 K/uL (ref 4.0–10.5)
nRBC: 0 % (ref 0.0–0.2)

## 2024-08-15 LAB — T4, FREE: Free T4: 0.81 ng/dL (ref 0.80–2.00)

## 2024-08-15 LAB — FOLATE: Folate: 9.7 ng/mL

## 2024-08-15 LAB — PROCALCITONIN: Procalcitonin: <0.1 ng/mL

## 2024-08-15 LAB — VITAMIN B12: Vitamin B-12: 3970 pg/mL — ABNORMAL HIGH (ref 180–914)

## 2024-08-15 LAB — TSH: TSH: 1.47 u[IU]/mL (ref 0.350–4.500)

## 2024-08-15 MED ORDER — FUROSEMIDE 10 MG/ML IJ SOLN
40.0000 mg | Freq: Once | INTRAMUSCULAR | Status: AC
  Administered 2024-08-15: 40 mg via INTRAVENOUS
  Filled 2024-08-15: qty 4

## 2024-08-15 MED ORDER — SODIUM CHLORIDE 0.9 % IV SOLN
100.0000 mg | Freq: Two times a day (BID) | INTRAVENOUS | Status: DC
  Administered 2024-08-15 – 2024-08-17 (×4): 100 mg via INTRAVENOUS
  Filled 2024-08-15 (×6): qty 100

## 2024-08-15 MED ORDER — SODIUM CHLORIDE 0.9 % IV SOLN
2.0000 g | Freq: Two times a day (BID) | INTRAVENOUS | Status: AC
  Administered 2024-08-15 – 2024-08-19 (×10): 2 g via INTRAVENOUS
  Filled 2024-08-15 (×10): qty 12.5

## 2024-08-15 MED ORDER — LORAZEPAM 2 MG/ML IJ SOLN
0.5000 mg | INTRAMUSCULAR | Status: DC | PRN
  Administered 2024-08-15 – 2024-08-19 (×8): 0.5 mg via INTRAVENOUS
  Filled 2024-08-15 (×8): qty 1

## 2024-08-15 MED ORDER — ACETAMINOPHEN 160 MG/5ML PO SOLN
1000.0000 mg | Freq: Three times a day (TID) | ORAL | Status: AC
  Administered 2024-08-15 – 2024-08-19 (×11): 1000 mg via ORAL
  Filled 2024-08-15 (×12): qty 40.6

## 2024-08-15 MED ORDER — HYDROXYZINE HCL 25 MG PO TABS
25.0000 mg | ORAL_TABLET | Freq: Three times a day (TID) | ORAL | Status: DC | PRN
  Administered 2024-08-15 (×2): 25 mg via ORAL
  Filled 2024-08-15 (×2): qty 1

## 2024-08-15 NOTE — Progress Notes (Signed)
2D echo complete 

## 2024-08-15 NOTE — Progress Notes (Signed)
 OT Cancellation Note  Patient Details Name: Deanna Salinas MRN: [id number] DOB: 1935/12/02   Cancelled Treatment:    Reason Eval/Treat Not Completed: Other (comment) (RN stated that she had administered Ativan  and to come back at a later time.)  Chiquita LOISE Sermon, OTR/L 08/15/2024, 3:04 PM

## 2024-08-15 NOTE — Progress Notes (Signed)
" °   08/15/24 1348  Spiritual Encounters  Type of Visit Initial  Care provided to: [facility] partners present during encounter Nurse  Referral source Nurse (RN/NT/LPN)  Reason for visit Routine spiritual support  OnCall Visit No   Reason for Visit: Chaplain responding to referral from RN suggesting that Pt and family could use support.  Description of Visit: Upon entering the room I found the Pt, Deanna Salinas in bed with her two daughters there for support.  Danee was sleeping fairly soundly and did not rouse at the sound of our voices.  I spoke with daughters about their mothers physical and emotional condition.  I also spoke with daughters about their own condition and listened with empathy as they shared their burdens and concerns.   After a brief conversation we decided it was better not to wake the Pt as she desperately needed sleep.  Daughters stated that they would reach out later when it was a better time to visit the Pt.  Plan of Care: I will plan to follow up with this Pt throughout this hospitalization.   Maude Roll, MDiv Chaplain, [facility] [email or web address] [phone] 08/15/2024 1:51 PM  "

## 2024-08-15 NOTE — Evaluation (Signed)
 Clinical/Bedside Swallow Evaluation Patient Details  Name: Deanna Salinas MRN: [id number] Date of Birth: May 11, 1936  Today's Date: 08/15/2024 Time: SLP Start Time (ACUTE ONLY): 1435 SLP Stop Time (ACUTE ONLY): 1503 SLP Time Calculation (min) (ACUTE ONLY): 28 min  Past Medical History:  Past Medical History:  Diagnosis Date   Deaf    Essential hypertension    Fatty liver    Hyperlipidemia    Type 2 diabetes mellitus (HCC)    Past Surgical History:  Past Surgical History:  Procedure Laterality Date   ABDOMINAL HYSTERECTOMY     APPENDECTOMY     At the time of hysterectomy   CESAREAN SECTION     X5   COLONOSCOPY     COLONOSCOPY N/A 06/11/2013   Colonic diverticulosis, hyperplastic polyps. no further screening colonoscopies recommended   ESOPHAGOGASTRODUODENOSCOPY N/A 02/20/2014   Procedure: ESOPHAGOGASTRODUODENOSCOPY (EGD);  Surgeon: Lamar CHRISTELLA Hollingshead, MD;  Location: [facility];  Service: Endoscopy;  Laterality: N/A;  11:00   HPI:  89 y.o. female with medical history significant for chronic pain syndrome, chronic insomnia, essential hypertension, hyperlipidemia, type 2 diabetes, prediabetes, hearing impairment, chronic HFpEF, who presents to the ER via EMS with respiratory distress after being found minimally responsive at home by family.       Reportedly, the patient may have accidentally overdosed with home hydrocodone . In the ER, O2 saturation in the 70s, BiPAP was applied.  Chest x-ray revealed diffuse infiltrates bilaterally, right greater than left.  She received Rocephin  and IV azithromycin  for community-acquired pneumonia and possible aspiration pneumonia. BSE requested.    Assessment / Plan / Recommendation  Clinical Impression  Clinical swallow evaluation completed at bedside with nursing present. Pt is lethargic, but agitated. She currently presents with cognitive based dysphagia due to altered mental status. She does not demonstrate adequate PO readiness in her current state  (had ativan  earlier, eyes closed, restless, poor lip closure), however nursing staff indicates that she was more appropriate earlier. Pt assessed with ice chips, thin water  via tsp, puree, and attempts with straw however Pt unable to suck from the straw (biting). Recommend continue diet as ordered or advance to D1/puree and thin but only feed when alert and upright, PO medication crushed as able in puree or ice cream. SLP will f/u during acute stay. SLP Visit Diagnosis: Dysphagia, unspecified (R13.10)    Aspiration Risk  Mild aspiration risk;Risk for inadequate nutrition/hydration    Diet Recommendation           Other Recommendations Oral Care Recommendations: Oral care BID;Staff/trained caregiver to provide oral care     Swallow Evaluation Recommendations Recommendations: PO diet PO Diet Recommendation: Dysphagia 1 (Pureed);Thin liquids (Level 0) Liquid Administration via: Spoon;Cup Medication Administration: Crushed with puree Supervision: Full assist for feeding;Full supervision/cueing for swallowing strategies Swallowing strategies  : Slow rate;Small bites/sips Postural changes: Position pt fully upright for meals;Stay upright 30-60 min after meals Oral care recommendations: Oral care BID (2x/day);Staff/trained caregiver to provide oral care   Assistance Recommended at Discharge    Functional Status Assessment Patient has had a recent decline in their functional status and demonstrates the ability to make significant improvements in function in a reasonable and predictable amount of time.  Frequency and Duration min 2x/week  1 week       Prognosis Prognosis for improved oropharyngeal function: Fair Barriers to Reach Goals: Behavior;Medication      Swallow Study   General Date of Onset: 08/14/24 HPI: 89 y.o. female with medical history significant for  chronic pain syndrome, chronic insomnia, essential hypertension, hyperlipidemia, type 2 diabetes, prediabetes, hearing  impairment, chronic HFpEF, who presents to the ER via EMS with respiratory distress after being found minimally responsive at home by family.       Reportedly, the patient may have accidentally overdosed with home hydrocodone . In the ER, O2 saturation in the 70s, BiPAP was applied.  Chest x-ray revealed diffuse infiltrates bilaterally, right greater than left.  She received Rocephin  and IV azithromycin  for community-acquired pneumonia and possible aspiration pneumonia. BSE requested. Type of Study: Bedside Swallow Evaluation Previous Swallow Assessment: N/A Diet Prior to this Study: Clear liquid diet Temperature Spikes Noted: No Respiratory Status: Nasal cannula History of Recent Intubation: No Behavior/Cognition: Lethargic/Drowsy;Requires cueing Oral Cavity Assessment: Within Functional Limits Oral Care Completed by SLP: Recent completion by staff Oral Cavity - Dentition: Adequate natural dentition Vision: Impaired for self-feeding Self-Feeding Abilities: Total assist Patient Positioning: Upright in bed Baseline Vocal Quality: Normal Volitional Cough: Cognitively unable to elicit Volitional Swallow: Unable to elicit    Oral/Motor/Sensory Function Overall Oral Motor/Sensory Function: Other (comment) (unable to assess,but suspect WNL)   Ice Chips Ice chips: Impaired Presentation: Spoon Oral Phase Impairments: Reduced labial seal;Poor awareness of bolus Oral Phase Functional Implications: Prolonged oral transit Pharyngeal Phase Impairments: Suspected delayed Swallow   Thin Liquid Thin Liquid: Impaired Presentation: Spoon;Cup;Straw Oral Phase Impairments: Reduced labial seal;Poor awareness of bolus;Reduced lingual movement/coordination Oral Phase Functional Implications: Right anterior spillage;Left anterior spillage Pharyngeal  Phase Impairments: Suspected delayed Swallow    Nectar Thick Nectar Thick Liquid: Not tested   Honey Thick Honey Thick Liquid: Impaired Presentation: Spoon Oral  Phase Impairments: Reduced labial seal;Reduced lingual movement/coordination;Poor awareness of bolus Oral Phase Functional Implications: Prolonged oral transit Pharyngeal Phase Impairments: Suspected delayed Swallow   Puree Puree: Impaired Presentation: Spoon Oral Phase Impairments: Reduced lingual movement/coordination Oral Phase Functional Implications: Prolonged oral transit   Solid     Solid: Not tested     Thank you,  Lamar Candy, CCC-SLP [phone]  Tammee Thielke 08/15/2024,3:21 PM

## 2024-08-15 NOTE — Progress Notes (Signed)
 Palliative:  HPI: 89 y.o. female  with past medical history of HFpEF, HTN, HLD, T2DM, hearing impairment, chronic pain syndrome, chronic insomnia admitted on 08/13/2024 with found minimally responsive at home by family with concern for multifocal community-acquired pneumonia requiring BiPAP. She has chronic pain and has recent prescription for hydrocodone  - concern she may have accidentally taken too many. Reviewed PDMP and noted increase in hydrocodone  from 5 mg to 10 mg filled on 08/11/24. 08/14/24 progressed to HHFNC 20L 70% FiO2.    I met today at Ms. Heron's bedside. Her two daughters are just arriving. RN and NT at bedside. Ms. Partridge is able to interact better than yesterday but still in distress. RN/NT reports that she has had ongoing cycles or ~20 minutes rest and then moaning/restless. She has been pulling off oxygen  at times so she is in restraints. She is telling me as well as her daughters I want to die - let me go. She talks of suffering and being tortured. She tells us  it isn't fair to keep her here like this. She complains of generalized pain everywhere. She is difficult to console. She tells us  she believes in God and believes she will go to heaven so we need to let her go. Difficult to fully assess orientation due to poor focus and concentration on conversation. She certainly has some level of understanding and calls her daughters by name. Daughters report that she has never spoken like this before.   I spoke with her husband over the phone who reports concern for taking too much gin and Benadryl . He shares they have had visits from [facility] palliative provider as well as a provider/NP from [facility] that come to the home. I shared with him that she is showing some small signs of improvement but she is still very ill and very distressed. I explained that it will take more time to see if and how much she will be able to improve.   I spoke more with daughters at bedside. We discussed the  complicated situation with her requesting we stop treatment. I did explain that she is not actively dying but she is sick and there is a risk that she becomes worse instead of better. I worry that she does not have the motivation to improve due to underlying poor quality of life. I did explain that if Ms. Riesen is able to demonstrate clear understanding we are obligated to follow her wishes. We are able to convince (for now) that she maintain current treatment with oxygen  and antibiotics to allow time for her son to come from [facility]. Family will discuss with son to make his way here. He is her baby boy and her favorite. I shared that he needs to be here in case of her insisting no further treatment and that maybe his presence can be calming to her for encouragement. We did discuss that if we stopped medical treatment now and treated her symptoms of pain and anxiety that her time would be very limited. Daughters are tearful but understanding. They are hoping she will continue with treatment and have improvement. They do understand there is a risk of further decline and even death. They confirm DNR/DNI. We will continue current treatment but no recommendations to escalate to any more invasive/aggressive measures.   Of note, there are some concerns with care at home as she lives with her husband and her children do not live locally. Children have wanted to take her to live with them where she  can get more care but husband has been resistant - husband also shares this with me as a contentious issue amongst family. Daughters feel she would have more will to live if she had more support living with her children.   I discussed with Dr. Evonnie this complicated situation. Will buy some time for son to come from [facility]. Continue to manage symptoms to get Ms. Plitt was rest and relief. Daughters are requesting that she continue treatment for 2 more days.   Exam: Awake, alert. Not able to confirm full orientation and capacity to  make decisions at this time. Existential distress. Agitation. Transitioned off HHFNC to HFNC 10L. Abd soft. BLE edema.   Plan: - DNR/DNI - Agitation: Continue Precedex . Hydroxyzine  PRN. Lorazepaim PRN. Treat pain.  - Pain non-specific and generalized (patient unable to give further descriptors: I ordered scheduled Tylenol  and toradol  yesterday. Changed Tylenol  to solution as unable to take pill. Fentanyl  25 mg IV q2h PRN pain - consider increasing dosage if needed to improved relief.  - Ongoing goals of care as patient requesting comfort care but family encouraging ongoing treatment - patient not demonstrating full capacity to make this decision unilaterally at this time  100 min  [name], NP Palliative Medicine Team Pager [phone] (Please see [email or web address] for schedule) Team Phone [phone]

## 2024-08-15 NOTE — Progress Notes (Addendum)
 "          PROGRESS NOTE  Deanna Salinas FMW:[id number] DOB: 04-10-1936 DOA: 08/13/2024 PCP: [name], MD  Brief History:  89 y.o. female with medical history significant for chronic pain syndrome, chronic insomnia, essential hypertension, hyperlipidemia, type 2 diabetes, prediabetes, hearing impairment, chronic HFpEF, who presents to the ER via EMS with respiratory distress after being found minimally responsive at home by family.     Reportedly, the patient may have accidentally overdosed with home hydrocodone .  Per her daughter at bedside, her home tramadol  was changed to hydrocodone  a week ago, because tramadol  was no longer effective in controlling her generalized pain.  She had her chronic pain pills refilled 2 days ago.     For the past 2 days she has been more somnolent.  Had a mild cough.  No reports of fevers or chills.  EMS was activated.  Upon EMS arrival, the patient had O2 saturation in the 40s, per EMS.  She was initially placed on nonrebreather mask then switched to CPAP en route and brought into the ER for further evaluation.   In the ER, O2 saturation in the 70s, BiPAP was applied.  Chest x-ray revealed diffuse infiltrates bilaterally, right greater than left.  She received Rocephin  and IV azithromycin  for community-acquired pneumonia and possible aspiration pneumonia.  TR[facility], hospitalist service, was asked admit.   To note, the patient did not receive Narcan and is currently more alert.   ED Course: Temperature 96.2.  BP 114/64, pulse 79, respiratory 21, saturation 100% on room BiPAP with FiO2 of 80%.  The patient was transitioned to [facility] at 20L @ 70% FiO2.  She was started on IV abx.  Palliative medicine was consulted.     Assessment/Plan: Acute respiratory failure with hypoxia and hypercarbia -Secondary to pneumonia and a degree of fluid overload - Currently on HFNC 20L@0 .70>>HFNC at 11L - Wean oxygen  as tolerated -repeat CXR in am  Severe Sepsis -due to  PNA -presented with tachycardia, tachypnea, resp failure - Lactic 2.0>>1.2 - initially fluid resuscitated - continue IV abx  Aspiration pneumonia/lobar pneumonia - Continue azithromycin  - [place] antibiotic coverage to cefepime  -viral resp panel -COVID/RSV/Flu--neg - speech therapy eval once pt is more cooperative - MRSA neg  Acute HFpEF - The patient has some signs of fluid overload - Personally reviewed chest x-ray--small bilateral pleural effusions, R-sided infiltrates -echo -start IV lasix , spot dosing  Opioid dependence - PDMP reviewed -norco 10/325, #42 filled 08/11/24 -norco 5/325, #90 filled 08/03/24 -tramadol  50 mg, #120 08/03/24 - Minimize opioids, but will not discontinue due to chronic usage - UDS positive for opiates and benzodiazepines  Elevated troponin -Secondary to demand ischemia - Troponin 23>> 26  Acute metabolic encephalopathy - Multifactorial including opioids, respiratory failure, infectious process - B12 - Folic acid  - TSH - CT brain negative for acute findings - requiring precedex  for sedation  Hyperkalemia - Resolved with treatment  Goals of care - Currently DNR/DNI - Appreciate palliative medicine--discussed with [name]     Family Communication:   daughters at bedside 1/7  Consultants:  palliative  Code Status:  DNR  DVT Prophylaxis:  [place] Heparin / [place] Lovenox    Procedures: As Listed in Progress Note Above  Antibiotics: Ceftriaxone  1/5>>1/7 Azithro 1/5>> -cefepime  1/5>>    The patient is critically ill with multiple organ systems failure and requires high complexity decision making for assessment and support, frequent evaluation and titration of therapies, application of advanced monitoring technologies and extensive interpretation  of multiple databases.  Critical care time -  40 mins.    Subjective: Pt is confused.  ROS limited.  Complains of pain all over and sob.  No vomiting, no diarrhea  Objective: Vitals:    08/15/24 0807 08/15/24 0819 08/15/24 0900 08/15/24 1000  BP:   110/70 113/74  Pulse:   83   Resp:   16 16  Temp: (!) 97.3 F (36.3 C)     TempSrc: Axillary     SpO2:  96% 96% 100%  Weight:      Height:        Intake/Output Summary (Last 24 hours) at 08/15/2024 1156 Last data filed at 08/15/2024 0857 Gross per 24 hour  Intake 2135.39 ml  Output 575 ml  Net 1560.39 ml   Weight change:  Exam:  General:  Pt is alert, does not follows command appropriately, not in acute distress; agitated easily HEENT: No icterus, No thrush, No neck mass, [place]/AT Cardiovascular: RRR, S1/S2, no rubs, no gallops +JVD Respiratory: bilateral rhonchi.  Bibasilar wheeze Abdomen: Soft/+BS, non tender, non distended, no guarding Extremities: 1+LE edema, No lymphangitis, No petechiae, No rashes, no synovitis   Data Reviewed: I have personally reviewed following labs and imaging studies Basic Metabolic Panel: Recent Labs  Lab 08/13/24 1713 08/14/24 1001 08/15/24 0431  NA 136 138 137  K 5.5* 5.0 4.8  CL 97* 99 100  CO2 23 31 31   GLUCOSE 150* 117* 100*  BUN 40* 40* 40*  CREATININE 0.95 1.15* 0.78  CALCIUM  9.3 8.9 8.6*   Liver Function Tests: Recent Labs  Lab 08/13/24 1713  AST 25  ALT 15  ALKPHOS 110  BILITOT 0.4  PROT 7.2  ALBUMIN 4.3   No results for input(s): LIPASE, AMYLASE in the last 168 hours. Recent Labs  Lab 08/14/24 1820  AMMONIA 34   Coagulation Profile: No results for input(s): INR, PROTIME in the last 168 hours. CBC: Recent Labs  Lab 08/13/24 1713 08/15/24 0431  WBC 11.2* 7.3  NEUTROABS 9.3* 5.9  HGB 13.5 12.2  HCT 43.8 37.6  MCV 108.4* 105.9*  PLT 288 207   Cardiac Enzymes: No results for input(s): CKTOTAL, CKMB, CKMBINDEX, TROPONINI in the last 168 hours. BNP: Invalid input(s): POCBNP CBG: Recent Labs  Lab 08/14/24 2311 08/15/24 0105 08/15/24 0416 08/15/24 0629 08/15/24 0805  GLUCAP 101* 108* 106* 113* 117*   HbA1C: No results  for input(s): HGBA1C in the last 72 hours. Urine analysis:    Component Value Date/Time   COLORURINE YELLOW 12/20/2020 1154   APPEARANCEUR CLEAR 12/20/2020 1154   LABSPEC 1.016 12/20/2020 1154   PHURINE 5.0 12/20/2020 1154   GLUCOSEU NEGATIVE 12/20/2020 1154   HGBUR MODERATE (A) 12/20/2020 1154   BILIRUBINUR NEGATIVE 12/20/2020 1154   KETONESUR NEGATIVE 12/20/2020 1154   PROTEINUR NEGATIVE 12/20/2020 1154   NITRITE NEGATIVE 12/20/2020 1154   LEUKOCYTESUR NEGATIVE 12/20/2020 1154   Sepsis Labs: @LABRCNTIP (procalcitonin:4,lacticidven:4) ) Recent Results (from the past 240 hours)  Culture, blood (routine x 2)     Status: None (Preliminary result)   Collection Time: 08/13/24  5:03 PM   Specimen: BLOOD  Result Value Ref Range Status   Specimen Description BLOOD  Final   Special Requests NONE  Final   Culture   Final    NO GROWTH 2 DAYS Performed at [facility], [address], [place], [place] [address]    Report Status PENDING  Incomplete  Resp panel by RT-PCR (RSV, Flu A&B, Covid) Anterior Nasal Swab  Status: None   Collection Time: 08/13/24  5:18 PM   Specimen: Anterior Nasal Swab  Result Value Ref Range Status   SARS Coronavirus 2 by RT PCR NEGATIVE NEGATIVE Final    Comment: (NOTE) SARS-CoV-2 target nucleic acids are NOT DETECTED.  The SARS-CoV-2 RNA is generally detectable in upper respiratory specimens during the acute phase of infection. The lowest concentration of SARS-CoV-2 viral copies this assay can detect is 138 copies/mL. A negative result does not preclude SARS-Cov-2 infection and should not be used as the sole basis for treatment or other patient management decisions. A negative result may occur with  improper specimen collection/handling, submission of specimen other than nasopharyngeal swab, presence of viral mutation(s) within the areas targeted by this assay, and inadequate number of viral copies(<138 copies/mL). A negative result must be combined  with clinical observations, patient history, and epidemiological information. The expected result is Negative.  Fact Sheet for Patients:  bloggercourse.com  Fact Sheet for Healthcare Providers:  seriousbroker.it  This test is no t yet approved or cleared by the [place]  FDA and  has been authorized for detection and/or diagnosis of SARS-CoV-2 by FDA under an Emergency Use Authorization (EUA). This EUA will remain  in effect (meaning this test can be used) for the duration of the COVID-19 declaration under Section 564(b)(1) of the Act, 21 U.S.C.section 360bbb-3(b)(1), unless the authorization is terminated  or revoked sooner.       Influenza A by PCR NEGATIVE NEGATIVE Final   Influenza B by PCR NEGATIVE NEGATIVE Final    Comment: (NOTE) The Xpert Xpress SARS-CoV-2/FLU/RSV plus assay is intended as an aid in the diagnosis of influenza from Nasopharyngeal swab specimens and should not be used as a sole basis for treatment. Nasal washings and aspirates are unacceptable for Xpert Xpress SARS-CoV-2/FLU/RSV testing.  Fact Sheet for Patients: bloggercourse.com  Fact Sheet for Healthcare Providers: seriousbroker.it  This test is not yet approved or cleared by the [place]  FDA and has been authorized for detection and/or diagnosis of SARS-CoV-2 by FDA under an Emergency Use Authorization (EUA). This EUA will remain in effect (meaning this test can be used) for the duration of the COVID-19 declaration under Section 564(b)(1) of the Act, 21 U.S.C. section 360bbb-3(b)(1), unless the authorization is terminated or revoked.     Resp Syncytial Virus by PCR NEGATIVE NEGATIVE Final    Comment: (NOTE) Fact Sheet for Patients: bloggercourse.com  Fact Sheet for Healthcare Providers: seriousbroker.it  This test is not yet approved  or cleared by the [place]  FDA and has been authorized for detection and/or diagnosis of SARS-CoV-2 by FDA under an Emergency Use Authorization (EUA). This EUA will remain in effect (meaning this test can be used) for the duration of the COVID-19 declaration under Section 564(b)(1) of the Act, 21 U.S.C. section 360bbb-3(b)(1), unless the authorization is terminated or revoked.  Performed at [facility], [address] [place], [place] [address]   Culture, blood (routine x 2)     Status: None (Preliminary result)   Collection Time: 08/13/24  5:30 PM   Specimen: BLOOD  Result Value Ref Range Status   Specimen Description BLOOD RIGHT ANTECUBITAL  Final   Special Requests   Final    BOTTLES DRAWN AEROBIC AND ANAEROBIC Blood Culture adequate volume   Culture   Final    NO GROWTH 2 DAYS Performed at [facility], [address], [place], [place] [address]    Report Status PENDING  Incomplete  MRSA Next Gen by  PCR, Nasal     Status: None   Collection Time: 08/13/24  8:55 PM   Specimen: Nasal Mucosa; Nasal Swab  Result Value Ref Range Status   MRSA by PCR Next Gen NOT DETECTED NOT DETECTED Final    Comment: (NOTE) The GeneXpert MRSA Assay (FDA approved for NASAL specimens only), is one component of a comprehensive MRSA colonization surveillance program. It is not intended to diagnose MRSA infection nor to guide or monitor treatment for MRSA infections. Test performance is not FDA approved in patients less than 74 years old. Performed at [facility], [address] [place], [place] [address]      Scheduled Meds:  acetaminophen  (TYLENOL ) oral liquid 160 mg/5 mL  1,000 mg Oral Q8H   Chlorhexidine  Gluconate Cloth  6 each Topical Daily   enoxaparin  (LOVENOX ) injection  40 mg Subcutaneous QHS   ketorolac   15 mg Intravenous Q6H   Continuous Infusions:  azithromycin  Stopped (08/14/24 1852)   cefTRIAXone  (ROCEPHIN )  IV Stopped (08/14/24 1747)   dexmedetomidine  (PRECEDEX ) IV  infusion 0.8 mcg/kg/hr (08/15/24 1052)    Procedures/Studies: CT HEAD WO CONTRAST ( ) Result Date: 08/14/2024 EXAM: CT HEAD WITHOUT CONTRAST 08/14/2024 03:19:06 PM TECHNIQUE: CT of the head was performed without the administration of intravenous contrast. Automated exposure control, iterative reconstruction, and/or weight based adjustment of the mA/kV was utilized to reduce the radiation dose to as low as reasonably achievable. COMPARISON: CT head 01/13/2021 CLINICAL HISTORY: Mental status change, unknown cause FINDINGS: Moderately motion limited study. BRAIN AND VENTRICLES: No acute hemorrhage. No evidence of acute infarct. No hydrocephalus. No extra-axial collection. No mass effect or midline shift. ORBITS: No acute abnormality. SINUSES: No acute abnormality. SOFT TISSUES AND SKULL: No acute soft tissue abnormality. No skull fracture. IMPRESSION: 1. Moderately motion limited study without obvious acute abnormality. Electronically signed by: Gilmore Molt 08/14/2024 11:10 PM EST RP Workstation: [id number]   DG Chest Portable 1 View Result Date: 08/13/2024 EXAM: 1 VIEW(S) XRAY OF THE CHEST 08/13/2024 05:21:00 PM COMPARISON: Comparison with 09/20/2022. CLINICAL HISTORY: SOB. FINDINGS: LUNGS AND PLEURA: Shallow inspiration. Elevation of the left hemidiaphragm. Interval development of airspace infiltrates in the right upper lung and right lower lung most likely representing pneumonia. Follow up to resolution is recommended to exclude an underlying obstructing lesion. Small bilateral pleural effusions. Probable emphysematous changes in the lungs. No pneumothorax. HEART AND MEDIASTINUM: Cardiac enlargement. Mediastinal contours appear intact. Calcification of the aorta. BONES AND SOFT TISSUES: Degenerative changes in the spine and shoulders. IMPRESSION: 1. Interval development of airspace infiltrates in the right upper lung and right lower lung, most likely representing pneumonia. Follow-up to resolution is  recommended to exclude an underlying obstructing lesion. 2. Small bilateral pleural effusions. 3. Cardiac enlargement. 4. Probable emphysematous changes in the lungs. Electronically signed by: Elsie Gravely MD 08/13/2024 06:24 PM EST RP Workstation: [id number]    Alm Schneider, DO  [facility]  If 7PM-7AM, please contact night-coverage [email or web address] Password [email or web address] 08/15/2024, 11:56 AM   LOS: 2 days   "

## 2024-08-16 ENCOUNTER — Inpatient Hospital Stay (HOSPITAL_COMMUNITY)

## 2024-08-16 DIAGNOSIS — A419 Sepsis, unspecified organism: Secondary | ICD-10-CM | POA: Diagnosis not present

## 2024-08-16 DIAGNOSIS — J181 Lobar pneumonia, unspecified organism: Secondary | ICD-10-CM | POA: Diagnosis not present

## 2024-08-16 DIAGNOSIS — I5031 Acute diastolic (congestive) heart failure: Secondary | ICD-10-CM

## 2024-08-16 DIAGNOSIS — J9601 Acute respiratory failure with hypoxia: Secondary | ICD-10-CM | POA: Diagnosis not present

## 2024-08-16 DIAGNOSIS — Z66 Do not resuscitate: Secondary | ICD-10-CM

## 2024-08-16 LAB — CBC
HCT: 38.2 % (ref 36.0–46.0)
Hemoglobin: 12.4 g/dL (ref 12.0–15.0)
MCH: 33.9 pg (ref 26.0–34.0)
MCHC: 32.5 g/dL (ref 30.0–36.0)
MCV: 104.4 fL — ABNORMAL HIGH (ref 80.0–100.0)
Platelets: 229 K/uL (ref 150–400)
RBC: 3.66 MIL/uL — ABNORMAL LOW (ref 3.87–5.11)
RDW: 13.1 % (ref 11.5–15.5)
WBC: 7.5 K/uL (ref 4.0–10.5)
nRBC: 0 % (ref 0.0–0.2)

## 2024-08-16 LAB — COMPREHENSIVE METABOLIC PANEL WITH GFR
ALT: 15 U/L (ref 0–44)
AST: 21 U/L (ref 15–41)
Albumin: 3.4 g/dL — ABNORMAL LOW (ref 3.5–5.0)
Alkaline Phosphatase: 75 U/L (ref 38–126)
Anion gap: 6 (ref 5–15)
BUN: 32 mg/dL — ABNORMAL HIGH (ref 8–23)
CO2: 33 mmol/L — ABNORMAL HIGH (ref 22–32)
Calcium: 8.3 mg/dL — ABNORMAL LOW (ref 8.9–10.3)
Chloride: 101 mmol/L (ref 98–111)
Creatinine, Ser: 0.72 mg/dL (ref 0.44–1.00)
GFR, Estimated: >60 mL/min
Glucose, Bld: 121 mg/dL — ABNORMAL HIGH (ref 70–99)
Potassium: 3.7 mmol/L (ref 3.5–5.1)
Sodium: 140 mmol/L (ref 135–145)
Total Bilirubin: 0.3 mg/dL (ref 0.0–1.2)
Total Protein: 5.3 g/dL — ABNORMAL LOW (ref 6.5–8.1)

## 2024-08-16 LAB — RESPIRATORY PANEL BY PCR

## 2024-08-16 LAB — GLUCOSE, CAPILLARY
Glucose-Capillary: 115 mg/dL — ABNORMAL HIGH (ref 70–99)
Glucose-Capillary: 92 mg/dL (ref 70–99)
Glucose-Capillary: 96 mg/dL (ref 70–99)

## 2024-08-16 LAB — MAGNESIUM: Magnesium: 2.2 mg/dL (ref 1.7–2.4)

## 2024-08-16 LAB — PRO BRAIN NATRIURETIC PEPTIDE: Pro Brain Natriuretic Peptide: 1953 pg/mL — ABNORMAL HIGH

## 2024-08-16 MED ORDER — POTASSIUM CHLORIDE 10 MEQ/100ML IV SOLN: 10.0000 meq | INTRAVENOUS | Status: DC

## 2024-08-16 MED ORDER — POTASSIUM CHLORIDE 10 MEQ/100ML IV SOLN
10.0000 meq | INTRAVENOUS | Status: AC
  Administered 2024-08-16 (×4): 10 meq via INTRAVENOUS
  Filled 2024-08-16 (×4): qty 100

## 2024-08-16 MED ORDER — FUROSEMIDE 10 MG/ML IJ SOLN
40.0000 mg | Freq: Two times a day (BID) | INTRAMUSCULAR | Status: AC
  Administered 2024-08-16 – 2024-08-17 (×2): 40 mg via INTRAVENOUS
  Filled 2024-08-16 (×2): qty 4

## 2024-08-16 NOTE — Plan of Care (Signed)
" °  Problem: Acute Rehab PT Goals(only PT should resolve) Goal: Pt Will Go Supine/Side To Sit Outcome: Progressing Flowsheets (Taken 08/16/2024 1555) Pt will go Supine/Side to Sit: with minimal assist Goal: Patient Will Transfer Sit To/From Stand Outcome: Progressing Flowsheets (Taken 08/16/2024 1555) Patient will transfer sit to/from stand: with minimal assist Goal: Pt Will Transfer Bed To Chair/Chair To Bed Outcome: Progressing Flowsheets (Taken 08/16/2024 1555) Pt will Transfer Bed to Chair/Chair to Bed: with min assist Goal: Pt Will Ambulate Outcome: Progressing Flowsheets (Taken 08/16/2024 1555) Pt will Ambulate:  25 feet  with minimal assist  with rolling walker  with moderate assist   3:56 PM, 08/16/2024 Lynwood Music, MPT Physical Therapist with [facility] [phone] office [phone] mobile phone  "

## 2024-08-16 NOTE — Evaluation (Signed)
 Occupational Therapy Evaluation Patient Details Name: Deanna Salinas MRN: [id number] DOB: 01-Jul-1936 Today's Date: 08/16/2024   History of Present Illness   Deanna Salinas is a 89 y.o. female with medical history significant for chronic pain syndrome, chronic insomnia, essential hypertension, hyperlipidemia, type 2 diabetes, prediabetes, hearing impairment, chronic HFpEF, who presents to the ER via EMS with respiratory distress after being found minimally responsive at home by family.       Reportedly, the patient may have accidentally overdosed with home hydrocodone .  Per her daughter at bedside, her home tramadol  was changed to hydrocodone  a week ago, because tramadol  was no longer effective in controlling her generalized pain.  She had her chronic pain pills refilled 2 days ago.       For the past 2 days she has been more somnolent.  Had a mild cough.  No reports of fevers or chills.  EMS was activated.  Upon EMS arrival, the patient had O2 saturation in the 40s, per EMS.  She was initially placed on nonrebreather mask then switched to CPAP en route and brought into the ER for further evaluation.     In the ER, O2 saturation in the 70s, BiPAP was applied.  Chest x-ray revealed diffuse infiltrates bilaterally, right greater than left.  She received Rocephin  and IV azithromycin  for community-acquired pneumonia and possible aspiration pneumonia.  TRH, hospitalist service, was asked admit. (per DO)     Clinical Impressions Family agreeable to OT and PT co-evaluation. Pt lethargic with increased arousal as session progressed. Pt is cognitively sound at baseline but lethargic with limited functional communication today. Pt required max A for bed mobility and for step pivot to chair, but pt did complete sit to stand with mod to max A fusing RW. Pt at level of max to total assist for most ADL's at this time. Pt left in the chair with call bell within reach and family present. Pt will benefit from continued OT  in the hospital to increase strength, balance, and endurance for safe ADL's.        If plan is discharge home, recommend the following:   A lot of help with walking and/or transfers;A lot of help with bathing/dressing/bathroom;Assistance with cooking/housework;Assist for transportation;Direct supervision/assist for medications management;Assistance with feeding;Help with stairs or ramp for entrance;Supervision due to cognitive status     Functional Status Assessment   Patient has had a recent decline in their functional status and demonstrates the ability to make significant improvements in function in a reasonable and predictable amount of time.     Equipment Recommendations   None recommended by OT            Precautions/Restrictions   Precautions Precautions: Fall Recall of Precautions/Restrictions: Intact Restrictions Weight Bearing Restrictions Per Provider Order: No     Mobility Bed Mobility Overal bed mobility: Needs Assistance Bed Mobility: Supine to Sit     Supine to sit: Max assist     General bed mobility comments: Assist for trunk control and to move B LE to EOB.    Transfers Overall transfer level: Needs assistance Equipment used: Rolling walker (2 wheels) Transfers: Sit to/from Stand, Bed to chair/wheelchair/BSC Sit to Stand: Max assist, Mod assist     Step pivot transfers: Max assist     General transfer comment: EOB to chair with RW; unsteady on feet; labored movement.      Balance Overall balance assessment: Needs assistance Sitting-balance support: Feet supported, Bilateral upper extremity supported Sitting balance-Leahy Scale:  Fair Sitting balance - Comments: Able to sit without additional physical support at EOB.   Standing balance support: Bilateral upper extremity supported, During functional activity, Reliant on assistive device for balance Standing balance-Leahy Scale: Poor Standing balance comment: using RW                            ADL either performed or assessed with clinical judgement   ADL Overall ADL's : Needs assistance/impaired Eating/Feeding: Maximal assistance;Sitting   Grooming: Maximal assistance;Sitting   Upper Body Bathing: Maximal assistance;Sitting   Lower Body Bathing: Maximal assistance;Total assistance;Sitting/lateral leans;Bed level   Upper Body Dressing : Maximal assistance;Sitting   Lower Body Dressing: Maximal assistance;Total assistance;Sitting/lateral leans;Bed level   Toilet Transfer: Maximal assistance;Rolling walker (2 wheels);Stand-pivot   Toileting- Clothing Manipulation and Hygiene: Maximal assistance;Total assistance;Bed level       Functional mobility during ADLs: Maximal assistance;Rolling walker (2 wheels)       Vision Baseline Vision/History: 0 No visual deficits Ability to See in Adequate Light: 0 Adequate Vision Assessment?:  (Difficult to assess at this time.)     Perception Perception: Not tested       Praxis Praxis: Not tested       Pertinent Vitals/Pain Pain Assessment Pain Assessment: Faces Faces Pain Scale: Hurts even more Pain Location: back Pain Descriptors / Indicators: Grimacing, Moaning Pain Intervention(s): Limited activity within patient's tolerance, Monitored during session, Repositioned     Extremity/Trunk Assessment Upper Extremity Assessment Upper Extremity Assessment: Difficult to assess due to impaired cognition;Generalized weakness (~75% of P/ROM for shoulder flexion R UE; near full P/ROm for shoulder flexion with L UE. Able to grasp RW during transfer.)   Lower Extremity Assessment Lower Extremity Assessment: Defer to PT evaluation   Cervical / Trunk Assessment Cervical / Trunk Assessment: Kyphotic   Communication Communication Communication: No apparent difficulties   Cognition Arousal: Lethargic Behavior During Therapy: WFL for tasks assessed/performed Cognition: Cognition impaired              OT - Cognition Comments: Pt lethargic at first but this did improve some through the session. Pt confused but does not have cognitive issues at baseline.                 Following commands: Impaired Following commands impaired: Follows one step commands inconsistently     Cueing  General Comments   Cueing Techniques: Verbal cues;Tactile cues                 Home Living Family/patient expects to be discharged to:: Private residence Living Arrangements: Spouse/significant other Available Help at Discharge: Family;Available 24 hours/day Type of Home: House Home Access: Elevator     Home Layout: Multi-level     Bathroom Shower/Tub: Walk-in shower   Bathroom Toilet: Handicapped height Bathroom Accessibility: Yes   Home Equipment: Rollator (4 wheels);Tub bench;Grab bars - tub/shower;Other (comment);BSC/3in1 (purewick)   Additional Comments: Husband is there 24/7 but is elderly himself. Daughters are only available PRN and do not live in this area. Has ropes around bed to help with bed mobility.      Prior Functioning/Environment Prior Level of Function : Needs assist       Physical Assist : ADLs (physical)   ADLs (physical): Dressing;IADLs Mobility Comments: Short distance household ambulator with rollator. Assist with bed mobility. ADLs Comments: Assist with dressing and IADL's.    OT Problem List: Decreased strength;Decreased range of motion;Decreased activity tolerance;Impaired balance (sitting and/or standing);Decreased cognition;Decreased  knowledge of use of DME or AE;Decreased safety awareness;Cardiopulmonary status limiting activity;Impaired UE functional use;Pain   OT Treatment/Interventions: Self-care/ADL training;Therapeutic exercise;Therapeutic activities;Patient/family education;Balance training;DME and/or AE instruction;Cognitive remediation/compensation;Visual/perceptual remediation/compensation      OT Goals(Current goals can be found in the  care plan section)   Acute Rehab OT Goals Patient Stated Goal: Improve function. OT Goal Formulation: With family Time For Goal Achievement: 08/30/24 Potential to Achieve Goals: Good   OT Frequency:  Min 2X/week    Co-evaluation PT/OT/SLP Co-Evaluation/Treatment: Yes Reason for Co-Treatment: To address functional/ADL transfers   OT goals addressed during session: ADL's and self-care                       End of Session Equipment Utilized During Treatment: Rolling walker (2 wheels);Oxygen  Nurse Communication: Mobility status  Activity Tolerance: Patient tolerated treatment well Patient left: with call bell/phone within reach;in chair;with family/visitor present  OT Visit Diagnosis: Unsteadiness on feet (R26.81);Other abnormalities of gait and mobility (R26.89);Muscle weakness (generalized) (M62.81);Other symptoms and signs involving cognitive function                Time: 8944-8885 OT Time Calculation (min): 19 min Charges:  OT General Charges $OT Visit: 1 Visit OT Evaluation $OT Eval Low Complexity: 1 Low  Javid Kemler OT, MOT  Jayson Person 08/16/2024, 2:50 PM

## 2024-08-16 NOTE — Evaluation (Signed)
 Physical Therapy Evaluation Patient Details Name: Deanna Salinas MRN: [id number] DOB: 1935-11-06 Today's Date: 08/16/2024  History of Present Illness  Deanna Salinas is a 89 y.o. female with medical history significant for chronic pain syndrome, chronic insomnia, essential hypertension, hyperlipidemia, type 2 diabetes, prediabetes, hearing impairment, chronic HFpEF, who presents to the ER via EMS with respiratory distress after being found minimally responsive at home by family.       Reportedly, the patient may have accidentally overdosed with home hydrocodone .  Per her daughter at bedside, her home tramadol  was changed to hydrocodone  a week ago, because tramadol  was no longer effective in controlling her generalized pain.  She had her chronic pain pills refilled 2 days ago.       For the past 2 days she has been more somnolent.  Had a mild cough.  No reports of fevers or chills.  EMS was activated.  Upon EMS arrival, the patient had O2 saturation in the 40s, per EMS.  She was initially placed on nonrebreather mask then switched to CPAP en route and brought into the ER for further evaluation.     In the ER, O2 saturation in the 70s, BiPAP was applied.  Chest x-ray revealed diffuse infiltrates bilaterally, right greater than left.  She received Rocephin  and IV azithromycin  for community-acquired pneumonia and possible aspiration pneumonia.  TRH, hospitalist service, was asked admit.   Clinical Impression  Patient demonstrates slow labored movement for sitting up at bedside, once seated had frequent posterior leaning that decreased after verbal/tactile cueing, limited to a few unsteady labored side steps due to BLE weakness and loss of balance and tolerated sitting up in chair with family members, nursing staff present. Patient will benefit from continued skilled physical therapy in hospital and recommended venue below to increase strength, balance, endurance for safe ADLs and gait.          If plan is  discharge home, recommend the following: A lot of help with bathing/dressing/bathroom;A lot of help with walking and/or transfers;Help with stairs or ramp for entrance;Assistance with cooking/housework;Assist for transportation   Can travel by private vehicle   No    Equipment Recommendations None recommended by PT  Recommendations for Other Services       Functional Status Assessment Patient has had a recent decline in their functional status and demonstrates the ability to make significant improvements in function in a reasonable and predictable amount of time.     Precautions / Restrictions Precautions Precautions: Fall Recall of Precautions/Restrictions: Intact Restrictions Weight Bearing Restrictions Per Provider Order: No      Mobility  Bed Mobility Overal bed mobility: Needs Assistance Bed Mobility: Supine to Sit     Supine to sit: Max assist     General bed mobility comments: frequent posterior leaning once seated    Transfers Overall transfer level: Needs assistance Equipment used: Rolling walker (2 wheels) Transfers: Sit to/from Stand, Bed to chair/wheelchair/BSC Sit to Stand: Max assist, Mod assist   Step pivot transfers: Max assist       General transfer comment: unsteady labored movement with buckling knees due to weakness    Ambulation/Gait Ambulation/Gait assistance: Max assist Gait Distance (Feet): 3 Feet Assistive device: Rolling walker (2 wheels) Gait Pattern/deviations: Decreased step length - right, Decreased step length - left, Decreased stride length Gait velocity: slow     General Gait Details: limited to a few unsteady labored side steps before having to sit due to loss of balance/weakness  Stairs  Wheelchair Mobility     Tilt Bed    Modified Rankin (Stroke Patients Only)       Balance Overall balance assessment: Needs assistance Sitting-balance support: Feet supported, No upper extremity supported Sitting  balance-Leahy Scale: Fair Sitting balance - Comments: seated at EOB   Standing balance support: Bilateral upper extremity supported, During functional activity, Reliant on assistive device for balance Standing balance-Leahy Scale: Poor Standing balance comment: using RW                             Pertinent Vitals/Pain Pain Assessment Pain Assessment: Faces Faces Pain Scale: Hurts even more Pain Location: back Pain Descriptors / Indicators: Grimacing, Moaning Pain Intervention(s): Limited activity within patient's tolerance, Monitored during session, Repositioned    Home Living Family/patient expects to be discharged to:: Private residence Living Arrangements: Spouse/significant other Available Help at Discharge: Family;Available 24 hours/day Type of Home: House Home Access: Elevator       Home Layout: Multi-level Home Equipment: Rollator (4 wheels);Tub bench;Grab bars - tub/shower;Other (comment);BSC/3in1 Additional Comments: Husband is there 24/7 but is elderly himself. Daughters are only available PRN and do not live in this area. Has ropes around bed to help with bed mobility.    Prior Function Prior Level of Function : Needs assist       Physical Assist : ADLs (physical);Mobility (physical) Mobility (physical): Bed mobility;Transfers;Gait;Stairs ADLs (physical): Dressing;IADLs Mobility Comments: Short distance household ambulator with rollator. Assist with bed mobility. ADLs Comments: Assist with dressing and IADL's.     Extremity/Trunk Assessment   Upper Extremity Assessment Upper Extremity Assessment: Defer to OT evaluation    Lower Extremity Assessment Lower Extremity Assessment: Generalized weakness    Cervical / Trunk Assessment Cervical / Trunk Assessment: Kyphotic  Communication   Communication Communication: No apparent difficulties    Cognition Arousal: Alert Behavior During Therapy: WFL for tasks assessed/performed                              Following commands: Impaired Following commands impaired: Follows one step commands inconsistently     Cueing Cueing Techniques: Verbal cues, Tactile cues     General Comments      Exercises     Assessment/Plan    PT Assessment Patient needs continued PT services  PT Problem List Decreased strength;Decreased activity tolerance;Decreased balance;Decreased mobility       PT Treatment Interventions DME instruction;Gait training;Stair training;Functional mobility training;Therapeutic activities;Therapeutic exercise;Balance training;Patient/family education    PT Goals (Current goals can be found in the Care Plan section)  Acute Rehab PT Goals Patient Stated Goal: return home with family to assist PT Goal Formulation: With patient/family Time For Goal Achievement: 08/30/24 Potential to Achieve Goals: Good    Frequency Min 3X/week     Co-evaluation PT/OT/SLP Co-Evaluation/Treatment: Yes Reason for Co-Treatment: To address functional/ADL transfers PT goals addressed during session: Mobility/safety with mobility;Balance;Proper use of DME OT goals addressed during session: ADL's and self-care       AM-PAC PT 6 Clicks Mobility  Outcome Measure Help needed turning from your back to your side while in a flat bed without using bedrails?: A Lot Help needed moving from lying on your back to sitting on the side of a flat bed without using bedrails?: A Lot Help needed moving to and from a bed to a chair (including a wheelchair)?: A Lot Help needed standing up from a chair using  your arms (e.g., wheelchair or bedside chair)?: A Lot Help needed to walk in hospital room?: A Lot Help needed climbing 3-5 steps with a railing? : Total 6 Click Score: 11    End of Session Equipment Utilized During Treatment: Oxygen  Activity Tolerance: Patient tolerated treatment well;Patient limited by fatigue Patient left: in chair;with call bell/phone within reach;with  family/visitor present;with nursing/sitter in room Nurse Communication: Mobility status PT Visit Diagnosis: Unsteadiness on feet (R26.81);Other abnormalities of gait and mobility (R26.89);Muscle weakness (generalized) (M62.81)    Time: 8954-8894 PT Time Calculation (min) (ACUTE ONLY): 20 min   Charges:   PT Evaluation $PT Eval Moderate Complexity: 1 Mod PT Treatments $Therapeutic Activity: 8-22 mins PT General Charges $$ ACUTE PT VISIT: 1 Visit         3:54 PM, 08/16/2024 Lynwood Music, MPT Physical Therapist with [facility] [phone] office [phone] mobile phone

## 2024-08-16 NOTE — Plan of Care (Signed)
" °  Problem: Education: Goal: Knowledge of General Education information will improve Description: Including pain rating scale, medication(s)/side effects and non-pharmacologic comfort measures Outcome: Not Progressing   Problem: Activity: Goal: Risk for activity intolerance will decrease Outcome: Not Progressing   Problem: Coping: Goal: Level of anxiety will decrease Outcome: Not Progressing   Problem: Pain Managment: Goal: General experience of comfort will improve and/or be controlled Outcome: Not Progressing   Problem: Safety: Goal: Ability to remain free from injury will improve Outcome: Not Progressing   "

## 2024-08-16 NOTE — Progress Notes (Addendum)
 Palliative:  HPI: 89 y.o. female  with past medical history of HFpEF, HTN, HLD, T2DM, hearing impairment, chronic pain syndrome, chronic insomnia admitted on 08/13/2024 with found minimally responsive at home by family with concern for multifocal community-acquired pneumonia requiring BiPAP. She has chronic pain and has recent prescription for hydrocodone  - concern she may have accidentally taken too many. Reviewed PDMP and noted increase in hydrocodone  from 5 mg to 10 mg filled on 08/11/24. 08/14/24 progressed to HHFNC 20L 70% FiO2. Palliative following for goals of care.   I discussed with RN. PT/OT is getting Ms. Dexheimer up to [employer] and daughters are at bedside. She is very lethargic today. Not at all with agitation she had yesterday. Seems to be changing from hyperactive delirium to hypoactive. Discussed with family at bedside. She had been so agitated and not sleeping so it is not surprising she is sleeping now.   We discussed further and family have discussed and agree that she has been confused. They share that their brother has heard her speak like that before and talking about poor quality of life and wanting to die. However, they feel that this is very circumstantial and that she would not feel this way if she were in a different environment. Goal at this time is to continue treatment with hopes of improvement. DNR/DNI in place but full scope otherwise. Family shares that when she went to [facility] to visit her son last May she had doctor visits there and placed on a regimen that she was doing well on but this all changed after she returned to [facility]. Their goal is to help her to improve from pneumonia and go to [facility] to live with her son and family.   All questions/concerns addressed. Emotional support provided.   Exam: Lethargic. Not conversing today. Breathing regular, unlabored - oxygen  needs improving. Abd soft. Weakness.   Plan: - DNR/DNI, full scope otherwise - Hopeful for improvement - Family to  obtain records so we can see recommendations they say were working for her pain  50 min  [name], NP Palliative Medicine Team Pager [phone] (Please see [email or web address] for schedule) Team Phone [phone]

## 2024-08-16 NOTE — Plan of Care (Signed)
" °  Problem: Acute Rehab OT Goals (only OT should resolve) Goal: Pt. Will Perform Eating Flowsheets (Taken 08/16/2024 1452) Pt Will Perform Eating: with modified independence Goal: Pt. Will Perform Grooming Flowsheets (Taken 08/16/2024 1452) Pt Will Perform Grooming:  with supervision  with modified independence  sitting Goal: Pt. Will Perform Upper Body Dressing Flowsheets (Taken 08/16/2024 1452) Pt Will Perform Upper Body Dressing:  with modified independence  sitting Goal: Pt. Will Perform Lower Body Dressing Flowsheets (Taken 08/16/2024 1452) Pt Will Perform Lower Body Dressing:  with min assist  sitting/lateral leans  with adaptive equipment Goal: Pt. Will Transfer To Toilet Flowsheets (Taken 08/16/2024 1452) Pt Will Transfer to Toilet:  with contact guard assist  stand pivot transfer Goal: Pt. Will Perform Toileting-Clothing Manipulation Flowsheets (Taken 08/16/2024 1452) Pt Will Perform Toileting - Clothing Manipulation and hygiene:  with contact guard assist  with min assist  sitting/lateral leans Goal: Pt/Caregiver Will Perform Home Exercise Program Flowsheets (Taken 08/16/2024 1452) Pt/caregiver will Perform Home Exercise Program:  Increased ROM  Increased strength  Both right and left upper extremity  With minimal assist  Wessley Emert OT, MOT  "

## 2024-08-16 NOTE — Progress Notes (Addendum)
 Speech Language Pathology Treatment: Dysphagia  Patient Details Name: ASHTEN SARNOWSKI MRN: [id number] DOB: Feb 20, 1936 Today's Date: 08/16/2024 Time: 8854-8796 SLP Time Calculation (min) (ACUTE ONLY): 18 min  Assessment / Plan / Recommendation Clinical Impression  Recommend continue current diet of Dysphagia 1(puree)/thin liquids with MAX multimodal cues provided/FULL supervision/A while pt alert for consumption of foods/liquids. ST will f/u for diet progression/dysphagia tx/education as mentation improves during acute stay.  Mrs. Borum seen for f/u dysphagia tx with pt lethargic/head down positioning noted prior to oral care completion and trials of various consistencies given max multimodal cues by SLP.  Pt consumed ice chips, puree and thin via straw given max cues with brisk tongue motion despite impaired mentation/lethargy.  No overt s/s of aspiration present during po trial, but pt did exhibit anterior loss with oral residue from puree consistency with oral suctioning utilized to remove from lateral sulcus.  ST will continue efforts as pt able/mentation improves during this hospitalization.    HPI HPI: 89 y.o. female with medical history significant for chronic pain syndrome, chronic insomnia, essential hypertension, hyperlipidemia, type 2 diabetes, prediabetes, hearing impairment, chronic HFpEF, who presented to the ER via EMS with respiratory distress on 08/13/24 after being found minimally responsive at home by family. Reportedly, the patient may have accidentally overdosed with home hydrocodone . In the ER, O2 saturation in the 70s, BiPAP was applied. Chest x-ray revealed diffuse infiltrates bilaterally, right greater than left. She received Rocephin  and IV azithromycin  for community-acquired pneumonia and possible aspiration pneumonia. BSE requested.  Pt placed on Dysphagia 1/thin liquid diet post BSE.  ST f/u for diet progression/dysphagia tx/education.      SLP Plan  Continue with current  plan of care        Swallow Evaluation Recommendations   Recommendations: PO diet PO Diet Recommendation: Dysphagia 1 (Pureed);Thin liquids (Level 0) Liquid Administration via: Spoon;Cup;Straw;Other (Comment) (max multimodal cues) Medication Administration: Crushed with puree Supervision: Full assist for feeding;Full supervision/cueing for swallowing strategies Postural changes: Position pt fully upright for meals Oral care recommendations: Oral care BID (2x/day);Staff/trained caregiver to provide oral care     Recommendations   PO diet (Dysphagia 1(Puree)/thin with max multimodal cues/A with meals                  Oral care BID;Staff/trained caregiver to provide oral care   Other (comment)  (TBD)     Continue with current plan of care     Pat Aliesha Dolata,M.S.,CCC-SLP  08/16/2024, 12:59 PM

## 2024-08-16 NOTE — Progress Notes (Signed)
 "          PROGRESS NOTE  Deanna Salinas FMW:[id number] DOB: 09/01/1935 DOA: 08/13/2024 PCP: [name], MD  Brief History:  89 y.o. female with medical history significant for chronic pain syndrome, chronic insomnia, essential hypertension, hyperlipidemia, type 2 diabetes,  hearing impairment, chronic HFpEF, who presents to the ER via EMS with respiratory distress after being found minimally responsive at home by family.     Reportedly, the patient may have accidentally overdosed with home hydrocodone .  Per her daughter at bedside, her home tramadol  was changed to hydrocodone  a week ago, because tramadol  was no longer effective in controlling her generalized pain.  She had her chronic pain pills refilled 2 days ago.     For the past 2 days PTA she has been more somnolent.  Had a mild cough.  No reports of fevers or chills.  EMS was activated.  Upon EMS arrival, the patient had O2 saturation in the 40s, per EMS.  She was initially placed on nonrebreather mask then switched to CPAP en route and brought into the ER for further evaluation.   In the ER, O2 saturation in the 70s, BiPAP was applied.  Chest x-ray revealed diffuse infiltrates bilaterally, right greater than left.  She received Rocephin  and IV azithromycin  for community-acquired pneumonia and possible aspiration pneumonia.  TRH, hospitalist service, was asked admit.   Of note, the patient did not receive Narcan and is currently more alert.   ED Course: Temperature 96.2.  BP 114/64, pulse 79, respiratory 21, saturation 100% on room BiPAP with FiO2 of 80%.  The patient was transitioned to [facility] at 20L @ 70% FiO2.  She was started on IV abx.  Palliative medicine was consulted to discuss GOC with family.  The patient was initially very agitated requiring Precedex  infusion.  Gradually, the Precedex  has been weaned as the patient's agitation improved. She was noted to be fluid overloaded.  IV fluids were stopped and the patient was started on  IV furosemide .   Assessment/Plan: Acute respiratory failure with hypoxia and hypercarbia -Secondary to pneumonia and a degree of fluid overload - Currently on HFNC 20L@0 .70>>HFNC at 11L>>HFNC 9L - Wean oxygen  as tolerated - 1/8--personally reviewed CXR--bilateral pleural effusions, patchy RUL opacity, vascular congestion   Severe Sepsis -due to PNA -presented with tachycardia, tachypnea, resp failure - Lactic 2.0>>1.2 - initially fluid resuscitated - continue IV abx   Aspiration pneumonia/lobar pneumonia - initially azithromycin >>IV doxye - Broadend antibiotic coverage to cefepime  -viral resp panel--neg -COVID/RSV/Flu--neg - speech therapy eval --dys 1 diet for now - MRSA neg   Acute HFpEF - continues to have signs of fluid overload - Personally reviewed chest x-ray--small bilateral pleural effusions, R-sided infiltrates -echo EF 50-55%, no WMA, G1DD, normal RVF, mild MR, mild-mod TR -continue IV lasix  - 2800 cc UO last 24 hours   Opioid dependence - PDMP reviewed -norco 10/325, #42 filled 08/11/24 -norco 5/325, #90 filled 08/03/24 -tramadol  50 mg, #120 08/03/24 - Minimize opioids, but will not discontinue due to chronic usage - UDS positive for opiates and benzodiazepines   Elevated troponin -Secondary to demand ischemia - Troponin 23>> 26   Acute metabolic encephalopathy - Multifactorial including opioids, respiratory failure, infectious process - B12--3970 - Folic acid --9.7 - TSH--1.470 - CT brain negative for acute findings - requiring precedex  for sedation down from 0.9 to 0.2 mcg/kg/min   Hyperkalemia - Resolved with treatment   Goals of care - Currently DNR/DNI - 1/8 discussed with daughters 1/8>>not ready  for comfort, but agree with DNR/DNI - Appreciate palliative medicine--discussed with [name]         Family Communication:   daughters at bedside 1/8   Consultants:  palliative   Code Status:  DNR   DVT Prophylaxis:   [place] Lovenox       Procedures: As Listed in Progress Note Above   Antibiotics: Ceftriaxone  1/5>>1/7 Azithro 1/5>> -cefepime  1/5>>     The patient is critically ill with multiple organ systems failure and requires high complexity decision making for assessment and support, frequent evaluation and titration of therapies, application of advanced monitoring technologies and extensive interpretation of multiple databases.  Critical care time - 35 mins.         Subjective: ROS limited by confusion and HOH.  Pt denies cp, sob.  Objective: Vitals:   08/16/24 0700 08/16/24 0800 08/16/24 0853 08/16/24 0900  BP: (!) 157/76 (!) 144/68  123/73  Pulse: (!) 113 (!) 44 (!) 44 (!) 42  Resp: 16 (!) 21 17 (!) 21  Temp:   (!) 97 F (36.1 C)   TempSrc:   Axillary   SpO2: 100% 100% 100% 100%  Weight:      Height:        Intake/Output Summary (Last 24 hours) at 08/16/2024 0916 Last data filed at 08/16/2024 9096 Gross per 24 hour  Intake 1088.27 ml  Output 2800 ml  Net -1711.73 ml   Weight change:  Exam:  General:  Pt is alert, does not follows commands appropriately, not in acute distress HEENT: No icterus, No thrush, No neck mass, [place]/AT Cardiovascular: RRR, S1/S2, no rubs, no gallops +PACs Respiratory: bibasilar crackles. No wheeze Abdomen: Soft/+BS, non tender, non distended, no guarding Extremities: No edema, No lymphangitis, No petechiae, No rashes, no synovitis   Data Reviewed: I have personally reviewed following labs and imaging studies Basic Metabolic Panel: Recent Labs  Lab 08/13/24 1713 08/14/24 1001 08/15/24 0431 08/16/24 0448  NA 136 138 137 140  K 5.5* 5.0 4.8 3.7  CL 97* 99 100 101  CO2 23 31 31  33*  GLUCOSE 150* 117* 100* 121*  BUN 40* 40* 40* 32*  CREATININE 0.95 1.15* 0.78 0.72  CALCIUM  9.3 8.9 8.6* 8.3*  MG  --   --   --  2.2   Liver Function Tests: Recent Labs  Lab 08/13/24 1713 08/16/24 0448  AST 25 21  ALT 15 15  ALKPHOS 110 75  BILITOT 0.4 0.3  PROT 7.2  5.3*  ALBUMIN 4.3 3.4*   No results for input(s): LIPASE, AMYLASE in the last 168 hours. Recent Labs  Lab 08/14/24 1820  AMMONIA 34   Coagulation Profile: No results for input(s): INR, PROTIME in the last 168 hours. CBC: Recent Labs  Lab 08/13/24 1713 08/15/24 0431 08/16/24 0448  WBC 11.2* 7.3 7.5  NEUTROABS 9.3* 5.9  --   HGB 13.5 12.2 12.4  HCT 43.8 37.6 38.2  MCV 108.4* 105.9* 104.4*  PLT 288 207 229   Cardiac Enzymes: No results for input(s): CKTOTAL, CKMB, CKMBINDEX, TROPONINI in the last 168 hours. BNP: Invalid input(s): POCBNP CBG: Recent Labs  Lab 08/15/24 0805 08/15/24 1208 08/15/24 1752 08/15/24 2341 08/16/24 0540  GLUCAP 117* 141* 129* 110* 115*   HbA1C: No results for input(s): HGBA1C in the last 72 hours. Urine analysis:    Component Value Date/Time   COLORURINE YELLOW 12/20/2020 1154   APPEARANCEUR CLEAR 12/20/2020 1154   LABSPEC 1.016 12/20/2020 1154   PHURINE 5.0 12/20/2020 1154  GLUCOSEU NEGATIVE 12/20/2020 1154   HGBUR MODERATE (A) 12/20/2020 1154   BILIRUBINUR NEGATIVE 12/20/2020 1154   KETONESUR NEGATIVE 12/20/2020 1154   PROTEINUR NEGATIVE 12/20/2020 1154   NITRITE NEGATIVE 12/20/2020 1154   LEUKOCYTESUR NEGATIVE 12/20/2020 1154   Sepsis Labs: @LABRCNTIP (procalcitonin:4,lacticidven:4) ) Recent Results (from the past 240 hours)  Culture, blood (routine x 2)     Status: None (Preliminary result)   Collection Time: 08/13/24  5:03 PM   Specimen: BLOOD  Result Value Ref Range Status   Specimen Description BLOOD  Final   Special Requests NONE  Final   Culture   Final    NO GROWTH 3 DAYS Performed at [facility], [address], [place], [place] [address]    Report Status PENDING  Incomplete  Resp panel by RT-PCR (RSV, Flu A&B, Covid) Anterior Nasal Swab     Status: None   Collection Time: 08/13/24  5:18 PM   Specimen: Anterior Nasal Swab  Result Value Ref Range Status   SARS Coronavirus 2 by RT PCR NEGATIVE  NEGATIVE Final    Comment: (NOTE) SARS-CoV-2 target nucleic acids are NOT DETECTED.  The SARS-CoV-2 RNA is generally detectable in upper respiratory specimens during the acute phase of infection. The lowest concentration of SARS-CoV-2 viral copies this assay can detect is 138 copies/mL. A negative result does not preclude SARS-Cov-2 infection and should not be used as the sole basis for treatment or other patient management decisions. A negative result may occur with  improper specimen collection/handling, submission of specimen other than nasopharyngeal swab, presence of viral mutation(s) within the areas targeted by this assay, and inadequate number of viral copies(<138 copies/mL). A negative result must be combined with clinical observations, patient history, and epidemiological information. The expected result is Negative.  Fact Sheet for Patients:  bloggercourse.com  Fact Sheet for Healthcare Providers:  seriousbroker.it  This test is no t yet approved or cleared by the [place]  FDA and  has been authorized for detection and/or diagnosis of SARS-CoV-2 by FDA under an Emergency Use Authorization (EUA). This EUA will remain  in effect (meaning this test can be used) for the duration of the COVID-19 declaration under Section 564(b)(1) of the Act, 21 U.S.C.section 360bbb-3(b)(1), unless the authorization is terminated  or revoked sooner.       Influenza A by PCR NEGATIVE NEGATIVE Final   Influenza B by PCR NEGATIVE NEGATIVE Final    Comment: (NOTE) The Xpert Xpress SARS-CoV-2/FLU/RSV plus assay is intended as an aid in the diagnosis of influenza from Nasopharyngeal swab specimens and should not be used as a sole basis for treatment. Nasal washings and aspirates are unacceptable for Xpert Xpress SARS-CoV-2/FLU/RSV testing.  Fact Sheet for Patients: bloggercourse.com  Fact Sheet for Healthcare  Providers: seriousbroker.it  This test is not yet approved or cleared by the [place]  FDA and has been authorized for detection and/or diagnosis of SARS-CoV-2 by FDA under an Emergency Use Authorization (EUA). This EUA will remain in effect (meaning this test can be used) for the duration of the COVID-19 declaration under Section 564(b)(1) of the Act, 21 U.S.C. section 360bbb-3(b)(1), unless the authorization is terminated or revoked.     Resp Syncytial Virus by PCR NEGATIVE NEGATIVE Final    Comment: (NOTE) Fact Sheet for Patients: bloggercourse.com  Fact Sheet for Healthcare Providers: seriousbroker.it  This test is not yet approved or cleared by the [place]  FDA and has been authorized for detection and/or diagnosis of SARS-CoV-2 by FDA under an Emergency Use  Authorization (EUA). This EUA will remain in effect (meaning this test can be used) for the duration of the COVID-19 declaration under Section 564(b)(1) of the Act, 21 U.S.C. section 360bbb-3(b)(1), unless the authorization is terminated or revoked.  Performed at [facility], [address], [place], [place] [address]   Culture, blood (routine x 2)     Status: None (Preliminary result)   Collection Time: 08/13/24  5:30 PM   Specimen: BLOOD  Result Value Ref Range Status   Specimen Description BLOOD RIGHT ANTECUBITAL  Final   Special Requests   Final    BOTTLES DRAWN AEROBIC AND ANAEROBIC Blood Culture adequate volume   Culture   Final    NO GROWTH 3 DAYS Performed at [facility], [address], [place], [place] [address]    Report Status PENDING  Incomplete  MRSA Next Gen by PCR, Nasal     Status: None   Collection Time: 08/13/24  8:55 PM   Specimen: Nasal Mucosa; Nasal Swab  Result Value Ref Range Status   MRSA by PCR Next Gen NOT DETECTED NOT DETECTED Final    Comment: (NOTE) The GeneXpert MRSA Assay (FDA approved for NASAL  specimens only), is one component of a comprehensive MRSA colonization surveillance program. It is not intended to diagnose MRSA infection nor to guide or monitor treatment for MRSA infections. Test performance is not FDA approved in patients less than 72 years old. Performed at [facility], [address], [place], [place] [address]   Respiratory (~20 pathogens) panel by PCR     Status: None   Collection Time: 08/15/24  5:25 PM   Specimen: Nasopharyngeal Swab; Respiratory  Result Value Ref Range Status   Adenovirus NOT DETECTED NOT DETECTED Final   Coronavirus 229E NOT DETECTED NOT DETECTED Final    Comment: (NOTE) The Coronavirus on the Respiratory Panel, DOES NOT test for the novel  Coronavirus (2019 nCoV)    Coronavirus HKU1 NOT DETECTED NOT DETECTED Final   Coronavirus NL63 NOT DETECTED NOT DETECTED Final   Coronavirus OC43 NOT DETECTED NOT DETECTED Final   Metapneumovirus NOT DETECTED NOT DETECTED Final   Rhinovirus / Enterovirus NOT DETECTED NOT DETECTED Final   Influenza A NOT DETECTED NOT DETECTED Final   Influenza B NOT DETECTED NOT DETECTED Final   Parainfluenza Virus 1 NOT DETECTED NOT DETECTED Final   Parainfluenza Virus 2 NOT DETECTED NOT DETECTED Final   Parainfluenza Virus 3 NOT DETECTED NOT DETECTED Final   Parainfluenza Virus 4 NOT DETECTED NOT DETECTED Final   Respiratory Syncytial Virus NOT DETECTED NOT DETECTED Final   Bordetella pertussis NOT DETECTED NOT DETECTED Final   Bordetella Parapertussis NOT DETECTED NOT DETECTED Final   Chlamydophila pneumoniae NOT DETECTED NOT DETECTED Final   Mycoplasma pneumoniae NOT DETECTED NOT DETECTED Final    Comment: Performed at [facility], [address], [place], [place] [address]     Scheduled Meds:  acetaminophen  (TYLENOL ) oral liquid 160 mg/5 mL  1,000 mg Oral Q8H   Chlorhexidine  Gluconate Cloth  6 each Topical Daily   enoxaparin  (LOVENOX ) injection  40 mg Subcutaneous QHS   Continuous Infusions:   ceFEPime  (MAXIPIME ) IV Stopped (08/15/24 2258)   dexmedetomidine  (PRECEDEX ) IV infusion 0.2 mcg/kg/hr (08/16/24 0903)   doxycycline  (VIBRAMYCIN ) IV 125 mL/hr at 08/16/24 9341    Procedures/Studies: DG CHEST PORT 1 VIEW Result Date: 08/16/2024 EXAM: 1 VIEW(S) XRAY OF THE CHEST 08/16/2024 04:20:00 AM COMPARISON: Portable chest 08/13/2024. CLINICAL HISTORY: 427266 Acute respiratory failure with hypoxia (  HCC) 427266 Acute respiratory failure with hypoxia (HCC) 427266 FINDINGS: LUNGS AND PLEURA: Mild perihilar vascular congestion. No appreciable interstitial edema. Small to moderate pleural effusions. Patchy right upper lobe airspace disease. Hazy atelectasis or consolidation in the lower lung fields alongside the effusions. Left upper lung field is clear. Pleural effusions appear to have increased since the prior study. No pneumothorax. HEART AND MEDIASTINUM: Cardiomegaly. Stable mediastinum with widening and aortic tortuosity and atherosclerosis. BONES AND SOFT TISSUES: Advanced arthrosis both shoulders. No acute osseous abnormality. In all other respects no further changes. IMPRESSION: 1. Small to moderate pleural effusions, increased since the prior study. 2. Patchy right upper lobe airspace disease and hazy atelectasis or consolidation in the lower lung fields alongside the effusions. 3. Cardiomegaly and mild perihilar vascular congestion. Electronically signed by: Francis Quam MD 08/16/2024 05:47 AM EST RP Workstation: [id number]   ECHOCARDIOGRAM COMPLETE Result Date: 08/15/2024    ECHOCARDIOGRAM REPORT   Patient Name:   Deanna Salinas Date of Exam: 08/15/2024 Medical Rec #:  [id number]       Height:       69.0 in Accession #:    [id number]      Weight:       167.8 lb Date of Birth:  [birth year]        BSA:          1.917 m Patient Age:    88 years        BP:           113/74 mmHg Patient Gender: F               HR:           81 bpm. Exam Location:  [facility] Procedure: 2D Echo, Color Doppler and Cardiac  Doppler (Both Spectral and Color            Flow Doppler were utilized during procedure). Indications:    CHF-Acute Diastolic I50.31  History:        Patient has prior history of Echocardiogram examinations, most                 recent 01/14/2021. Risk Factors:Diabetes and Hypertension.  Sonographer:    Sydnee` Wilson RDCS Referring Phys: [id number] CAROLE N HALL IMPRESSIONS  1. Left ventricular ejection fraction, by estimation, is 50 to 55%. The left ventricle has low normal function. The left ventricle has no regional wall motion abnormalities. There is mild asymmetric left ventricular hypertrophy of the basal segment. Left ventricular diastolic parameters are consistent with Grade I diastolic dysfunction (impaired relaxation).  2. Right ventricular systolic function is normal. The right ventricular size is normal. There is moderately elevated pulmonary artery systolic pressure. The estimated right ventricular systolic pressure is 53.2 mmHg.  3. The mitral valve is grossly normal. Mild mitral valve regurgitation.  4. Tricuspid valve regurgitation is mild to moderate.  5. The aortic valve was not well visualized. There is mild calcification of the aortic valve. Aortic valve regurgitation is not visualized. Aortic valve mean gradient measures 4.0 mmHg.  6. The inferior vena cava is dilated in size with >50% respiratory variability, suggesting right atrial pressure of 8 mmHg. Comparison(s): LVEF 50-55% with grade 1 diastolic dysfunction. Moderately elevated RVSP 53 mmHg. Mild mitral regurgitation. Mild to moderate tricuspid regurgitation. FINDINGS  Left Ventricle: Left ventricular ejection fraction, by estimation, is 50 to 55%. The left ventricle has low normal function. The left ventricle has no regional wall motion abnormalities. The left ventricular internal cavity size  was normal in size. There is mild asymmetric left ventricular hypertrophy of the basal segment. Left ventricular diastolic parameters are consistent  with Grade I diastolic dysfunction (impaired relaxation). Right Ventricle: The right ventricular size is normal. No increase in right ventricular wall thickness. Right ventricular systolic function is normal. There is moderately elevated pulmonary artery systolic pressure. The tricuspid regurgitant velocity is 3.36 m/s, and with an assumed right atrial pressure of 8 mmHg, the estimated right ventricular systolic pressure is 53.2 mmHg. Left Atrium: Left atrial size was normal in size. Right Atrium: Right atrial size was normal in size. Pericardium: There is no evidence of pericardial effusion. Mitral Valve: The mitral valve is grossly normal. Mild mitral valve regurgitation. Tricuspid Valve: The tricuspid valve is grossly normal. Tricuspid valve regurgitation is mild to moderate. Aortic Valve: The aortic valve was not well visualized. There is mild calcification of the aortic valve. Aortic valve regurgitation is not visualized. Aortic valve mean gradient measures 4.0 mmHg. Aortic valve peak gradient measures 7.0 mmHg. Aortic valve area, by VTI measures 2.54 cm. Pulmonic Valve: The pulmonic valve was not well visualized. Pulmonic valve regurgitation is trivial. Aorta: The aortic root and ascending aorta are structurally normal, with no evidence of dilitation. Venous: The inferior vena cava is dilated in size with greater than 50% respiratory variability, suggesting right atrial pressure of 8 mmHg. IAS/Shunts: No atrial level shunt detected by color flow Doppler. Additional Comments: 3D was performed not requiring image post processing on an independent workstation and was indeterminate.  LEFT VENTRICLE PLAX 2D LVIDd:         3.80 cm     Diastology LVIDs:         3.20 cm     LV e' medial:    5.00 cm/s LV PW:         1.00 cm     LV E/e' medial:  15.6 LV IVS:        1.30 cm     LV e' lateral:   8.16 cm/s LVOT diam:     2.00 cm     LV E/e' lateral: 9.6 LV SV:         81 LV SV Index:   42 LVOT Area:     3.14 cm  LV  Volumes (MOD) LV vol d, MOD A2C: 70.7 ml LV vol d, MOD A4C: 81.5 ml LV vol s, MOD A2C: 22.4 ml LV vol s, MOD A4C: 33.5 ml LV SV MOD A2C:     48.3 ml LV SV MOD A4C:     81.5 ml LV SV MOD BP:      48.9 ml RIGHT VENTRICLE RV S prime:     12.40 cm/s TAPSE (M-mode): 1.6 cm LEFT ATRIUM             Index        RIGHT ATRIUM           Index LA diam:        3.10 cm 1.62 cm/m   RA Area:     12.90 cm LA Vol (A2C):   37.0 ml 19.30 ml/m  RA Volume:   30.20 ml  15.75 ml/m LA Vol (A4C):   38.8 ml 20.24 ml/m LA Biplane Vol: 39.3 ml 20.50 ml/m  AORTIC VALVE AV Area (Vmax):    2.78 cm AV Area (Vmean):   2.49 cm AV Area (VTI):     2.54 cm AV Vmax:           132.00  cm/s AV Vmean:          96.200 cm/s AV VTI:            0.318 m AV Peak Grad:      7.0 mmHg AV Mean Grad:      4.0 mmHg LVOT Vmax:         117.00 cm/s LVOT Vmean:        76.100 cm/s LVOT VTI:          0.257 m LVOT/AV VTI ratio: 0.81  AORTA Ao Root diam: 2.70 cm Ao Asc diam:  3.00 cm MITRAL VALVE                TRICUSPID VALVE MV Area (PHT): 2.62 cm     TR Peak grad:   45.2 mmHg MV Decel Time: 290 msec     TR Vmax:        336.00 cm/s MV E velocity: 78.10 cm/s MV A velocity: 122.00 cm/s  SHUNTS MV E/A ratio:  0.64         Systemic VTI:  0.26 m                             Systemic Diam: 2.00 cm Jayson Sierras MD Electronically signed by Jayson Sierras MD Signature Date/Time: 08/15/2024/1:15:00 PM    Final    CT HEAD WO CONTRAST ( ) Result Date: 08/14/2024 EXAM: CT HEAD WITHOUT CONTRAST 08/14/2024 03:19:06 PM TECHNIQUE: CT of the head was performed without the administration of intravenous contrast. Automated exposure control, iterative reconstruction, and/or weight based adjustment of the mA/kV was utilized to reduce the radiation dose to as low as reasonably achievable. COMPARISON: CT head 01/13/2021 CLINICAL HISTORY: Mental status change, unknown cause FINDINGS: Moderately motion limited study. BRAIN AND VENTRICLES: No acute hemorrhage. No evidence of acute  infarct. No hydrocephalus. No extra-axial collection. No mass effect or midline shift. ORBITS: No acute abnormality. SINUSES: No acute abnormality. SOFT TISSUES AND SKULL: No acute soft tissue abnormality. No skull fracture. IMPRESSION: 1. Moderately motion limited study without obvious acute abnormality. Electronically signed by: Gilmore Molt 08/14/2024 11:10 PM EST RP Workstation: [id number]   DG Chest Portable 1 View Result Date: 08/13/2024 EXAM: 1 VIEW(S) XRAY OF THE CHEST 08/13/2024 05:21:00 PM COMPARISON: Comparison with 09/20/2022. CLINICAL HISTORY: SOB. FINDINGS: LUNGS AND PLEURA: Shallow inspiration. Elevation of the left hemidiaphragm. Interval development of airspace infiltrates in the right upper lung and right lower lung most likely representing pneumonia. Follow up to resolution is recommended to exclude an underlying obstructing lesion. Small bilateral pleural effusions. Probable emphysematous changes in the lungs. No pneumothorax. HEART AND MEDIASTINUM: Cardiac enlargement. Mediastinal contours appear intact. Calcification of the aorta. BONES AND SOFT TISSUES: Degenerative changes in the spine and shoulders. IMPRESSION: 1. Interval development of airspace infiltrates in the right upper lung and right lower lung, most likely representing pneumonia. Follow-up to resolution is recommended to exclude an underlying obstructing lesion. 2. Small bilateral pleural effusions. 3. Cardiac enlargement. 4. Probable emphysematous changes in the lungs. Electronically signed by: Elsie Gravely MD 08/13/2024 06:24 PM EST RP Workstation: [id number]    Alm Schneider, DO  [facility]  If 7PM-7AM, please contact night-coverage [email or web address] Password [email or web address] 08/16/2024, 9:16 AM   LOS: 3 days   "

## 2024-08-17 DIAGNOSIS — A419 Sepsis, unspecified organism: Secondary | ICD-10-CM | POA: Diagnosis not present

## 2024-08-17 DIAGNOSIS — I5031 Acute diastolic (congestive) heart failure: Secondary | ICD-10-CM | POA: Diagnosis not present

## 2024-08-17 DIAGNOSIS — J9602 Acute respiratory failure with hypercapnia: Secondary | ICD-10-CM | POA: Diagnosis not present

## 2024-08-17 DIAGNOSIS — J9601 Acute respiratory failure with hypoxia: Secondary | ICD-10-CM | POA: Diagnosis not present

## 2024-08-17 LAB — MAGNESIUM: Magnesium: 1.9 mg/dL (ref 1.7–2.4)

## 2024-08-17 LAB — BASIC METABOLIC PANEL WITH GFR
Anion gap: 17 — ABNORMAL HIGH (ref 5–15)
BUN: 18 mg/dL (ref 8–23)
CO2: 27 mmol/L (ref 22–32)
Calcium: 8.8 mg/dL — ABNORMAL LOW (ref 8.9–10.3)
Chloride: 97 mmol/L — ABNORMAL LOW (ref 98–111)
Creatinine, Ser: 0.6 mg/dL (ref 0.44–1.00)
GFR, Estimated: >60 mL/min
Glucose, Bld: 83 mg/dL (ref 70–99)
Potassium: 3.4 mmol/L — ABNORMAL LOW (ref 3.5–5.1)
Sodium: 141 mmol/L (ref 135–145)

## 2024-08-17 LAB — GLUCOSE, CAPILLARY
Glucose-Capillary: 134 mg/dL — ABNORMAL HIGH (ref 70–99)
Glucose-Capillary: 159 mg/dL — ABNORMAL HIGH (ref 70–99)
Glucose-Capillary: 162 mg/dL — ABNORMAL HIGH (ref 70–99)
Glucose-Capillary: 83 mg/dL (ref 70–99)

## 2024-08-17 LAB — FOLATE: Folate: 15.1 ng/mL

## 2024-08-17 LAB — PHOSPHORUS: Phosphorus: 2.7 mg/dL (ref 2.5–4.6)

## 2024-08-17 MED ORDER — BISACODYL 10 MG RE SUPP
10.0000 mg | Freq: Once | RECTAL | Status: AC
  Administered 2024-08-17: 10 mg via RECTAL
  Filled 2024-08-17: qty 1

## 2024-08-17 MED ORDER — MAGNESIUM OXIDE -MG SUPPLEMENT 400 (240 MG) MG PO TABS
400.0000 mg | ORAL_TABLET | Freq: Every day | ORAL | Status: DC
  Administered 2024-08-17 – 2024-08-18 (×2): 400 mg via ORAL
  Filled 2024-08-17 (×2): qty 1

## 2024-08-17 MED ORDER — K PHOS MONO-SOD PHOS DI & MONO 155-852-130 MG PO TABS
500.0000 mg | ORAL_TABLET | Freq: Every day | ORAL | Status: DC
  Administered 2024-08-17 – 2024-08-18 (×2): 500 mg via ORAL
  Filled 2024-08-17 (×2): qty 2

## 2024-08-17 MED ORDER — DOXYCYCLINE HYCLATE 100 MG PO TABS
100.0000 mg | ORAL_TABLET | Freq: Two times a day (BID) | ORAL | Status: AC
  Administered 2024-08-17 – 2024-08-19 (×5): 100 mg via ORAL
  Filled 2024-08-17 (×5): qty 1

## 2024-08-17 MED ORDER — FUROSEMIDE 10 MG/ML IJ SOLN
40.0000 mg | Freq: Every day | INTRAMUSCULAR | Status: DC
  Administered 2024-08-18 – 2024-08-19 (×2): 40 mg via INTRAVENOUS
  Filled 2024-08-17 (×2): qty 4

## 2024-08-17 MED ORDER — POTASSIUM CHLORIDE CRYS ER 20 MEQ PO TBCR
40.0000 meq | EXTENDED_RELEASE_TABLET | Freq: Once | ORAL | Status: AC
  Administered 2024-08-17: 40 meq via ORAL
  Filled 2024-08-17: qty 2

## 2024-08-17 MED ORDER — AMLODIPINE BESYLATE 5 MG PO TABS
2.5000 mg | ORAL_TABLET | Freq: Every day | ORAL | Status: DC
  Administered 2024-08-17 – 2024-08-20 (×4): 2.5 mg via ORAL
  Filled 2024-08-17 (×4): qty 1

## 2024-08-17 MED ORDER — TRAMADOL HCL 50 MG PO TABS
50.0000 mg | ORAL_TABLET | Freq: Four times a day (QID) | ORAL | Status: DC | PRN
  Administered 2024-08-17 – 2024-08-20 (×6): 50 mg via ORAL
  Filled 2024-08-17 (×6): qty 1

## 2024-08-17 NOTE — Progress Notes (Signed)
 "          PROGRESS NOTE  Deanna Salinas FMW:[id number] DOB: 06/28/1936 DOA: 08/13/2024 PCP: Deanna Salinas LABOR, MD  Brief History:  89 y.o. female with medical history significant for chronic pain syndrome, chronic insomnia, essential hypertension, hyperlipidemia, type 2 diabetes,  hearing impairment, chronic HFpEF, who presents to the ER via EMS with respiratory distress after being found minimally responsive at home by family.     Reportedly, the patient may have accidentally overdosed with home hydrocodone .  Per her daughter at bedside, her home tramadol  was changed to hydrocodone  a week ago, because tramadol  was no longer effective in controlling her generalized pain.  She had her chronic pain pills refilled 2 days ago.     For the past 2 days PTA she has been more somnolent.  Had a mild cough.  No reports of fevers or chills.  EMS was activated.  Upon EMS arrival, the patient had O2 saturation in the 40s, per EMS.  She was initially placed on nonrebreather mask then switched to CPAP en route and brought into the ER for further evaluation.   In the ER, O2 saturation in the 70s, BiPAP was applied.  Chest x-ray revealed diffuse infiltrates bilaterally, right greater than left.  She received Rocephin  and IV azithromycin  for community-acquired pneumonia and possible aspiration pneumonia.  TR[facility], hospitalist service, was asked admit.   Of note, the patient did not receive Narcan and is currently more alert.   ED Course: Temperature 96.2.  BP 114/64, pulse 79, respiratory 21, saturation 100% on room BiPAP with FiO2 of 80%.  The patient was transitioned to [facility] at 20L @ 70% FiO2.  She was started on IV abx.  Palliative medicine was consulted to discuss GOC with family.  The patient was initially very agitated requiring Precedex  infusion.  Gradually, the Precedex  has been weaned as the patient's agitation improved. She was noted to be fluid overloaded.  IV fluids were stopped and the patient was started on  IV furosemide .   Assessment/Plan: Acute respiratory failure with hypoxia and hypercarbia -Secondary to pneumonia and a degree of fluid overload - Currently on HFNC 20L@0 .70>>HFNC at 11L>>HFNC 9L>>2L - Wean oxygen  as tolerated - 1/8--personally reviewed CXR--bilateral pleural effusions, patchy RUL opacity, vascular congestion   Severe Sepsis -due to PNA -presented with tachycardia, tachypnea, resp failure - Lactic 2.0>>1.2 - initially fluid resuscitated - continue IV abx - sepsis physiology now resolved   Aspiration pneumonia/lobar pneumonia - initially azithromycin >>IV doxy - Broadend antibiotic coverage to cefepime  -viral resp panel--neg -COVID/RSV/Flu--neg - speech therapy eval --dys 1>>dys 3 - MRSA neg   Acute HFpEF - continues to have signs of fluid overload - Personally reviewed chest x-ray--small bilateral pleural effusions, R-sided infiltrates -echo EF 50-55%, no WMA, G1DD, normal RVF, mild MR, mild-mod TR -continue IV lasix  - 5200 cc UO last 24 hours   Opioid dependence - PDMP reviewed -norco 10/325, #42 filled 08/11/24 -norco 5/325, #90 filled 08/03/24 -tramadol  50 mg, #120 08/03/24 - Minimize opioids, but will not discontinue due to chronic usage - UDS positive for opiates and benzodiazepines   Elevated troponin -Secondary to demand ischemia - Troponin 23>> 26   Acute metabolic encephalopathy - Multifactorial including opioids, respiratory failure, infectious process - B12--3970 - Folic acid --9.7 - TSH--1.470 - CT brain negative for acute findings - requiring precedex  for sedation initially>>weaned off 1/8   Hypokalemia - Repleted -mag 1.9   Goals of care - Currently DNR/DNI - 1/8 discussed with daughters 1/8>>not ready  for comfort, but agree with DNR/DNI - Appreciate palliative medicine--discussed with Deanna Salinas         Family Communication:   daughters at bedside 1/9   Consultants:  palliative   Code Status:  DNR   DVT Prophylaxis:    [place] Lovenox      Procedures: As Listed in Progress Note Above   Antibiotics: Ceftriaxone  1/5>>1/7 Azithro 1/5>>1/6 Doxy 1/7>> -cefepime  1/5>>      Subjective: Pt has intermittent bouts of anxiety.  Denies cp, sob, abd pain.  No reports of vomiting or diarrhea  Objective: Vitals:   08/17/24 1200 08/17/24 1410 08/17/24 1500 08/17/24 1600  BP: (!) 149/76 (!) 155/76 (!) 142/76 (!) 146/69  Pulse: 94 (!) 103 87 87  Resp: (!) 25 18 16 15   Temp:      TempSrc:      SpO2: 100% 98% 97% 98%  Weight:      Height:        Intake/Output Summary (Last 24 hours) at 08/17/2024 1705 Last data filed at 08/17/2024 1400 Gross per 24 hour  Intake 1060 ml  Output 6750 ml  Net -5690 ml   Weight change:  Exam:  General:  Pt is alert, follows commands appropriately, not in acute distress HEENT: No icterus, No thrush, No neck mass, Deanna Salinas Cardiovascular: RRR, S1/S2, no rubs, no gallops Respiratory: bibasilar crackles.  No wheeze Abdomen: Soft/+BS, non tender, non distended, no guarding Extremities: 1 + LE edema, No lymphangitis, No petechiae, No rashes, no synovitis   Data Reviewed: I have personally reviewed following labs and imaging studies Basic Metabolic Panel: Recent Labs  Lab 08/13/24 1713 08/14/24 1001 08/15/24 0431 08/16/24 0448 08/17/24 0532  NA 136 138 137 140 141  K 5.5* 5.0 4.8 3.7 3.4*  CL 97* 99 100 101 97*  CO2 23 31 31  33* 27  GLUCOSE 150* 117* 100* 121* 83  BUN 40* 40* 40* 32* 18  CREATININE 0.95 1.15* 0.78 0.72 0.60  CALCIUM  9.3 8.9 8.6* 8.3* 8.8*  MG  --   --   --  2.2 1.9  PHOS  --   --   --   --  2.7   Liver Function Tests: Recent Labs  Lab 08/13/24 1713 08/16/24 0448  AST 25 21  ALT 15 15  ALKPHOS 110 75  BILITOT 0.4 0.3  PROT 7.2 5.3*  ALBUMIN 4.3 3.4*   No results for input(s): LIPASE, AMYLASE in the last 168 hours. Recent Labs  Lab 08/14/24 1820  AMMONIA 34   Coagulation Profile: No results for input(s): INR, PROTIME in the last  168 hours. CBC: Recent Labs  Lab 08/13/24 1713 08/15/24 0431 08/16/24 0448  WBC 11.2* 7.3 7.5  NEUTROABS 9.3* 5.9  --   HGB 13.5 12.2 12.4  HCT 43.8 37.6 38.2  MCV 108.4* 105.9* 104.4*  PLT 288 207 229   Cardiac Enzymes: No results for input(s): CKTOTAL, CKMB, CKMBINDEX, TROPONINI in the last 168 hours. BNP: Invalid input(s): POCBNP CBG: Recent Labs  Lab 08/16/24 0540 08/16/24 1144 08/16/24 2329 08/17/24 0524 08/17/24 1132  GLUCAP 115* 92 96 83 159*   HbA1C: No results for input(s): HGBA1C in the last 72 hours. Urine analysis:    Component Value Date/Time   COLORURINE YELLOW 12/20/2020 1154   APPEARANCEUR CLEAR 12/20/2020 1154   LABSPEC 1.016 12/20/2020 1154   PHURINE 5.0 12/20/2020 1154   GLUCOSEU NEGATIVE 12/20/2020 1154   HGBUR MODERATE (A) 12/20/2020 1154   BILIRUBINUR NEGATIVE 12/20/2020 1154   KETONESUR  NEGATIVE 12/20/2020 1154   PROTEINUR NEGATIVE 12/20/2020 1154   NITRITE NEGATIVE 12/20/2020 1154   LEUKOCYTESUR NEGATIVE 12/20/2020 1154   Sepsis Labs: @LABRCNTIP (procalcitonin:4,lacticidven:4) ) Recent Results (from the past 240 hours)  Culture, blood (routine x 2)     Status: None (Preliminary result)   Collection Time: 08/13/24  5:03 PM   Specimen: BLOOD  Result Value Ref Range Status   Specimen Description BLOOD  Final   Special Requests NONE  Final   Culture   Final    NO GROWTH 4 DAYS Performed at [facility], [address], [place], [place] [address]    Report Status PENDING  Incomplete  Resp panel by RT-PCR (RSV, Flu A&B, Covid) Anterior Nasal Swab     Status: None   Collection Time: 08/13/24  5:18 PM   Specimen: Anterior Nasal Swab  Result Value Ref Range Status   SARS Coronavirus 2 by RT PCR NEGATIVE NEGATIVE Final    Comment: (NOTE) SARS-CoV-2 target nucleic acids are NOT DETECTED.  The SARS-CoV-2 RNA is generally detectable in upper respiratory specimens during the acute phase of infection. The lowest concentration of  SARS-CoV-2 viral copies this assay can detect is 138 copies/mL. A negative result does not preclude SARS-Cov-2 infection and should not be used as the sole basis for treatment or other patient management decisions. A negative result may occur with  improper specimen collection/handling, submission of specimen other than nasopharyngeal swab, presence of viral mutation(s) within the areas targeted by this assay, and inadequate number of viral copies(<138 copies/mL). A negative result must be combined with clinical observations, patient history, and epidemiological information. The expected result is Negative.  Fact Sheet for Patients:  bloggercourse.com  Fact Sheet for Healthcare Providers:  seriousbroker.it  This test is no t yet approved or cleared by the [place]  FDA and  has been authorized for detection and/or diagnosis of SARS-CoV-2 by FDA under an Emergency Use Authorization (EUA). This EUA will remain  in effect (meaning this test can be used) for the duration of the COVID-19 declaration under Section 564(b)(1) of the Act, 21 U.S.C.section 360bbb-3(b)(1), unless the authorization is terminated  or revoked sooner.       Influenza A by PCR NEGATIVE NEGATIVE Final   Influenza B by PCR NEGATIVE NEGATIVE Final    Comment: (NOTE) The Xpert Xpress SARS-CoV-2/FLU/RSV plus assay is intended as an aid in the diagnosis of influenza from Nasopharyngeal swab specimens and should not be used as a sole basis for treatment. Nasal washings and aspirates are unacceptable for Xpert Xpress SARS-CoV-2/FLU/RSV testing.  Fact Sheet for Patients: bloggercourse.com  Fact Sheet for Healthcare Providers: seriousbroker.it  This test is not yet approved or cleared by the [place]  FDA and has been authorized for detection and/or diagnosis of SARS-CoV-2 by FDA under an Emergency Use  Authorization (EUA). This EUA will remain in effect (meaning this test can be used) for the duration of the COVID-19 declaration under Section 564(b)(1) of the Act, 21 U.S.C. section 360bbb-3(b)(1), unless the authorization is terminated or revoked.     Resp Syncytial Virus by PCR NEGATIVE NEGATIVE Final    Comment: (NOTE) Fact Sheet for Patients: bloggercourse.com  Fact Sheet for Healthcare Providers: seriousbroker.it  This test is not yet approved or cleared by the [place]  FDA and has been authorized for detection and/or diagnosis of SARS-CoV-2 by FDA under an Emergency Use Authorization (EUA). This EUA will remain in effect (meaning this test can be used) for the duration of the COVID-19  declaration under Section 564(b)(1) of the Act, 21 U.S.C. section 360bbb-3(b)(1), unless the authorization is terminated or revoked.  Performed at [facility], [address] [place], [place] [address]   Culture, blood (routine x 2)     Status: None (Preliminary result)   Collection Time: 08/13/24  5:30 PM   Specimen: BLOOD  Result Value Ref Range Status   Specimen Description BLOOD RIGHT ANTECUBITAL  Final   Special Requests   Final    BOTTLES DRAWN AEROBIC AND ANAEROBIC Blood Culture adequate volume   Culture   Final    NO GROWTH 4 DAYS Performed at [facility], [address], [place], [place] [address]    Report Status PENDING  Incomplete  MRSA Next Gen by PCR, Nasal     Status: None   Collection Time: 08/13/24  8:55 PM   Specimen: Nasal Mucosa; Nasal Swab  Result Value Ref Range Status   MRSA by PCR Next Gen NOT DETECTED NOT DETECTED Final    Comment: (NOTE) The GeneXpert MRSA Assay (FDA approved for NASAL specimens only), is one component of a comprehensive MRSA colonization surveillance program. It is not intended to diagnose MRSA infection nor to guide or monitor treatment for MRSA infections. Test performance is not FDA  approved in patients less than 72 years old. Performed at [facility], [address] [place], [place] [address]   Respiratory (~20 pathogens) panel by PCR     Status: None   Collection Time: 08/15/24  5:25 PM   Specimen: Nasopharyngeal Swab; Respiratory  Result Value Ref Range Status   Adenovirus NOT DETECTED NOT DETECTED Final   Coronavirus 229E NOT DETECTED NOT DETECTED Final    Comment: (NOTE) The Coronavirus on the Respiratory Panel, DOES NOT test for the novel  Coronavirus (2019 nCoV)    Coronavirus HKU1 NOT DETECTED NOT DETECTED Final   Coronavirus NL63 NOT DETECTED NOT DETECTED Final   Coronavirus OC43 NOT DETECTED NOT DETECTED Final   Metapneumovirus NOT DETECTED NOT DETECTED Final   Rhinovirus / Enterovirus NOT DETECTED NOT DETECTED Final   Influenza A NOT DETECTED NOT DETECTED Final   Influenza B NOT DETECTED NOT DETECTED Final   Parainfluenza Virus 1 NOT DETECTED NOT DETECTED Final   Parainfluenza Virus 2 NOT DETECTED NOT DETECTED Final   Parainfluenza Virus 3 NOT DETECTED NOT DETECTED Final   Parainfluenza Virus 4 NOT DETECTED NOT DETECTED Final   Respiratory Syncytial Virus NOT DETECTED NOT DETECTED Final   Bordetella pertussis NOT DETECTED NOT DETECTED Final   Bordetella Parapertussis NOT DETECTED NOT DETECTED Final   Chlamydophila pneumoniae NOT DETECTED NOT DETECTED Final   Mycoplasma pneumoniae NOT DETECTED NOT DETECTED Final    Comment: Performed at [facility], [address] [place], [place] [address]     Scheduled Meds:  acetaminophen  (TYLENOL ) oral liquid 160 mg/5 mL  1,000 mg Oral Q8H   amLODipine   2.5 mg Oral Daily   Chlorhexidine  Gluconate Cloth  6 each Topical Daily   doxycycline   100 mg Oral Q12H   enoxaparin  (LOVENOX ) injection  40 mg Subcutaneous QHS   [START ON 08/18/2024] furosemide   40 mg Intravenous Daily   magnesium  oxide  400 mg Oral Daily   phosphorus  500 mg Oral Daily   Continuous Infusions:  ceFEPime  (MAXIPIME ) IV Stopped  (08/17/24 0945)   dexmedetomidine  (PRECEDEX ) IV infusion Stopped (08/16/24 0918)    Procedures/Studies: DG CHEST PORT 1 VIEW Result Date: 08/16/2024 EXAM: 1 VIEW(S) XRAY OF THE CHEST 08/16/2024 04:20:00  AM COMPARISON: Portable chest 08/13/2024. CLINICAL HISTORY: 427266 Acute respiratory failure with hypoxia (HCC) 427266 Acute respiratory failure with hypoxia (HCC) 427266 FINDINGS: LUNGS AND PLEURA: Mild perihilar vascular congestion. No appreciable interstitial edema. Small to moderate pleural effusions. Patchy right upper lobe airspace disease. Hazy atelectasis or consolidation in the lower lung fields alongside the effusions. Left upper lung field is clear. Pleural effusions appear to have increased since the prior study. No pneumothorax. HEART AND MEDIASTINUM: Cardiomegaly. Stable mediastinum with widening and aortic tortuosity and atherosclerosis. BONES AND SOFT TISSUES: Advanced arthrosis both shoulders. No acute osseous abnormality. In all other respects no further changes. IMPRESSION: 1. Small to moderate pleural effusions, increased since the prior study. 2. Patchy right upper lobe airspace disease and hazy atelectasis or consolidation in the lower lung fields alongside the effusions. 3. Cardiomegaly and mild perihilar vascular congestion. Electronically signed by: Francis Quam MD 08/16/2024 05:47 AM EST RP Workstation: [id number]   ECHOCARDIOGRAM COMPLETE Result Date: 08/15/2024    ECHOCARDIOGRAM REPORT   Patient Name:   Deanna Salinas Date of Exam: 08/15/2024 Medical Rec #:  [id number]       Height:       69.0 in Accession #:    [id number]      Weight:       167.8 lb Date of Birth:  1936/02/26        BSA:          1.917 m Patient Age:    88 years        BP:           113/74 mmHg Patient Gender: F               HR:           81 bpm. Exam Location:  [facility] Procedure: 2D Echo, Color Doppler and Cardiac Doppler (Both Spectral and Color            Flow Doppler were utilized during procedure).  Indications:    CHF-Acute Diastolic I50.31  History:        Patient has prior history of Echocardiogram examinations, most                 recent 01/14/2021. Risk Factors:Diabetes and Hypertension.  Sonographer:    Sydnee` Wilson RDCS Referring Phys: [id number] CAROLE N HALL IMPRESSIONS  1. Left ventricular ejection fraction, by estimation, is 50 to 55%. The left ventricle has low normal function. The left ventricle has no regional wall motion abnormalities. There is mild asymmetric left ventricular hypertrophy of the basal segment. Left ventricular diastolic parameters are consistent with Grade I diastolic dysfunction (impaired relaxation).  2. Right ventricular systolic function is normal. The right ventricular size is normal. There is moderately elevated pulmonary artery systolic pressure. The estimated right ventricular systolic pressure is 53.2 mmHg.  3. The mitral valve is grossly normal. Mild mitral valve regurgitation.  4. Tricuspid valve regurgitation is mild to moderate.  5. The aortic valve was not well visualized. There is mild calcification of the aortic valve. Aortic valve regurgitation is not visualized. Aortic valve mean gradient measures 4.0 mmHg.  6. The inferior vena cava is dilated in size with >50% respiratory variability, suggesting right atrial pressure of 8 mmHg. Comparison(s): LVEF 50-55% with grade 1 diastolic dysfunction. Moderately elevated RVSP 53 mmHg. Mild mitral regurgitation. Mild to moderate tricuspid regurgitation. FINDINGS  Left Ventricle: Left ventricular ejection fraction, by estimation, is 50 to 55%. The left ventricle has low normal function. The left  ventricle has no regional wall motion abnormalities. The left ventricular internal cavity size was normal in size. There is mild asymmetric left ventricular hypertrophy of the basal segment. Left ventricular diastolic parameters are consistent with Grade I diastolic dysfunction (impaired relaxation). Right Ventricle: The right  ventricular size is normal. No increase in right ventricular wall thickness. Right ventricular systolic function is normal. There is moderately elevated pulmonary artery systolic pressure. The tricuspid regurgitant velocity is 3.36 m/s, and with an assumed right atrial pressure of 8 mmHg, the estimated right ventricular systolic pressure is 53.2 mmHg. Left Atrium: Left atrial size was normal in size. Right Atrium: Right atrial size was normal in size. Pericardium: There is no evidence of pericardial effusion. Mitral Valve: The mitral valve is grossly normal. Mild mitral valve regurgitation. Tricuspid Valve: The tricuspid valve is grossly normal. Tricuspid valve regurgitation is mild to moderate. Aortic Valve: The aortic valve was not well visualized. There is mild calcification of the aortic valve. Aortic valve regurgitation is not visualized. Aortic valve mean gradient measures 4.0 mmHg. Aortic valve peak gradient measures 7.0 mmHg. Aortic valve area, by VTI measures 2.54 cm. Pulmonic Valve: The pulmonic valve was not well visualized. Pulmonic valve regurgitation is trivial. Aorta: The aortic root and ascending aorta are structurally normal, with no evidence of dilitation. Venous: The inferior vena cava is dilated in size with greater than 50% respiratory variability, suggesting right atrial pressure of 8 mmHg. IAS/Shunts: No atrial level shunt detected by color flow Doppler. Additional Comments: 3D was performed not requiring image post processing on an independent workstation and was indeterminate.  LEFT VENTRICLE PLAX 2D LVIDd:         3.80 cm     Diastology LVIDs:         3.20 cm     LV e' medial:    5.00 cm/s LV PW:         1.00 cm     LV E/e' medial:  15.6 LV IVS:        1.30 cm     LV e' lateral:   8.16 cm/s LVOT diam:     2.00 cm     LV E/e' lateral: 9.6 LV SV:         81 LV SV Index:   42 LVOT Area:     3.14 cm  LV Volumes (MOD) LV vol d, MOD A2C: 70.7 ml LV vol d, MOD A4C: 81.5 ml LV vol s, MOD A2C:  22.4 ml LV vol s, MOD A4C: 33.5 ml LV SV MOD A2C:     48.3 ml LV SV MOD A4C:     81.5 ml LV SV MOD BP:      48.9 ml RIGHT VENTRICLE RV S prime:     12.40 cm/s TAPSE (M-mode): 1.6 cm LEFT ATRIUM             Index        RIGHT ATRIUM           Index LA diam:        3.10 cm 1.62 cm/m   RA Area:     12.90 cm LA Vol (A2C):   37.0 ml 19.30 ml/m  RA Volume:   30.20 ml  15.75 ml/m LA Vol (A4C):   38.8 ml 20.24 ml/m LA Biplane Vol: 39.3 ml 20.50 ml/m  AORTIC VALVE AV Area (Vmax):    2.78 cm AV Area (Vmean):   2.49 cm AV Area (VTI):     2.54 cm  AV Vmax:           132.00 cm/s AV Vmean:          96.200 cm/s AV VTI:            0.318 m AV Peak Grad:      7.0 mmHg AV Mean Grad:      4.0 mmHg LVOT Vmax:         117.00 cm/s LVOT Vmean:        76.100 cm/s LVOT VTI:          0.257 m LVOT/AV VTI ratio: 0.81  AORTA Ao Root diam: 2.70 cm Ao Asc diam:  3.00 cm MITRAL VALVE                TRICUSPID VALVE MV Area (PHT): 2.62 cm     TR Peak grad:   45.2 mmHg MV Decel Time: 290 msec     TR Vmax:        336.00 cm/s MV E velocity: 78.10 cm/s MV A velocity: 122.00 cm/s  SHUNTS MV E/A ratio:  0.64         Systemic VTI:  0.26 m                             Systemic Diam: 2.00 cm Jayson Sierras MD Electronically signed by Jayson Sierras MD Signature Date/Time: 08/15/2024/1:15:00 PM    Final    CT HEAD WO CONTRAST ( ) Result Date: 08/14/2024 EXAM: CT HEAD WITHOUT CONTRAST 08/14/2024 03:19:06 PM TECHNIQUE: CT of the head was performed without the administration of intravenous contrast. Automated exposure control, iterative reconstruction, and/or weight based adjustment of the mA/kV was utilized to reduce the radiation dose to as low as reasonably achievable. COMPARISON: CT head 01/13/2021 CLINICAL HISTORY: Mental status change, unknown cause FINDINGS: Moderately motion limited study. BRAIN AND VENTRICLES: No acute hemorrhage. No evidence of acute infarct. No hydrocephalus. No extra-axial collection. No mass effect or midline shift.  ORBITS: No acute abnormality. SINUSES: No acute abnormality. SOFT TISSUES AND SKULL: No acute soft tissue abnormality. No skull fracture. IMPRESSION: 1. Moderately motion limited study without obvious acute abnormality. Electronically signed by: Gilmore Molt 08/14/2024 11:10 PM EST RP Workstation: [id number]   DG Chest Portable 1 View Result Date: 08/13/2024 EXAM: 1 VIEW(S) XRAY OF THE CHEST 08/13/2024 05:21:00 PM COMPARISON: Comparison with 09/20/2022. CLINICAL HISTORY: SOB. FINDINGS: LUNGS AND PLEURA: Shallow inspiration. Elevation of the left hemidiaphragm. Interval development of airspace infiltrates in the right upper lung and right lower lung most likely representing pneumonia. Follow up to resolution is recommended to exclude an underlying obstructing lesion. Small bilateral pleural effusions. Probable emphysematous changes in the lungs. No pneumothorax. HEART AND MEDIASTINUM: Cardiac enlargement. Mediastinal contours appear intact. Calcification of the aorta. BONES AND SOFT TISSUES: Degenerative changes in the spine and shoulders. IMPRESSION: 1. Interval development of airspace infiltrates in the right upper lung and right lower lung, most likely representing pneumonia. Follow-up to resolution is recommended to exclude an underlying obstructing lesion. 2. Small bilateral pleural effusions. 3. Cardiac enlargement. 4. Probable emphysematous changes in the lungs. Electronically signed by: Elsie Gravely MD 08/13/2024 06:24 PM EST RP Workstation: [id number]    Alm Schneider, DO  [facility]  If 7PM-7AM, please contact night-coverage [email or web address] Password [email or web address] 08/17/2024, 5:05 PM   LOS: 4 days   "

## 2024-08-17 NOTE — Plan of Care (Signed)
 Patient improving today.  Has been up to chair and ambulated in room to [facility] with heavy assist.  Patient also able to have BM today.  Vitals remain stable.

## 2024-08-17 NOTE — Progress Notes (Signed)
 Speech Language Pathology Treatment: Dysphagia  Patient Details Name: AIESHA LELAND MRN: [id number] DOB: Aug 17, 1935 Today's Date: 08/17/2024 Time: 8954-8897 SLP Time Calculation (min) (ACUTE ONLY): 17 min  Assessment / Plan / Recommendation Clinical Impression  Pt with improved mentation/alertness this session. Full participation noted. Pt is upright in bedside chair for intake, is able to self feed. Demonstrates intact oral phase for regular and soft solids c/b timely and complete mastication and full oral clearance. No overt s/sx aspiration noted for solids, thin liquids, pills whole with thin. Delayed intermittent throat clearing, likely habitual. Recommend diet upgrade to dys 1 for fatigue management, thin liquids, meds whole. ST to continue following for tolerance.    HPI HPI: 89 y.o. female with medical history significant for chronic pain syndrome, chronic insomnia, essential hypertension, hyperlipidemia, type 2 diabetes, prediabetes, hearing impairment, chronic HFpEF, who presented to the ER via EMS with respiratory distress on 08/13/24 after being found minimally responsive at home by family. Reportedly, the patient may have accidentally overdosed with home hydrocodone . In the ER, O2 saturation in the 70s, BiPAP was applied. Chest x-ray revealed diffuse infiltrates bilaterally, right greater than left. She received Rocephin  and IV azithromycin  for community-acquired pneumonia and possible aspiration pneumonia. BSE requested.  Pt placed on Dysphagia 1/thin liquid diet post BSE.  ST f/u for diet progression/dysphagia tx/education.      SLP Plan           Swallow Evaluation Recommendations   Recommendations: PO diet PO Diet Recommendation: Dysphagia 3 (Mechanical soft);Thin liquids (Level 0) Liquid Administration via: Straw Medication Administration: Whole meds with liquid Supervision: Patient able to self-feed Postural changes: Position pt fully upright for meals Oral care  recommendations: Oral care BID (2x/day)     Recommendations   Dysphagia 3, thin liquids, general aspiration precautions, meds whole with liquids, large pills halved as able                  Oral care BID;Staff/trained caregiver to provide oral care     Dysphagia, unspecified (R13.10)           Harlene LITTIE Ned  08/17/2024, 11:02 AM

## 2024-08-17 NOTE — NC FL2 (Signed)
 " [place]  MEDICAID FL2 LEVEL OF CARE FORM     IDENTIFICATION  Patient Name: Deanna Salinas Birthdate: [birth year] Sex: female Admission Date (Current Location): 08/13/2024  [facility] and [place] Number:  [employer] and Address:  [facility],  [address] [place] [address]      Provider Number: [id number]  Attending Physician Name and Address:  Evonnie Lenis, MD  Relative Name and Phone Number:       Current Level of Care: Hospital Recommended Level of Care: Skilled Nursing Facility Prior Approval Number:    Date Approved/Denied:   PASRR Number:    Discharge Plan: SNF    Current Diagnoses: Patient Active Problem List   Diagnosis Date Noted   Acute heart failure with preserved ejection fraction (HFpEF) (HCC) 08/16/2024   Acute respiratory failure with hypoxia and hypercarbia (HCC) 08/15/2024   Lobar pneumonia 08/15/2024   Opioid dependence (HCC) 08/15/2024   Sepsis due to undetermined organism (HCC) 08/15/2024   Acute hypoxemic respiratory failure (HCC) 08/13/2024   Hearing impaired 06/24/2022   Chronic pain 05/20/2022   Bilateral hearing loss 03/09/2021   Myalgia due to statin 03/09/2021   Fatigue fracture of vertebra, site unspecified, initial encounter for fracture 01/19/2021   Other secondary hypertension 01/19/2021   Multiple fractures of ribs, left side, subsequent encounter for fracture with routine healing 01/19/2021   Diabetes mellitus due to underlying condition with hyperglycemia (HCC) 01/19/2021   Community acquired pneumonia of left lower lobe of lung    Rib fractures 01/10/2021   Reactive airway disease 06/04/2020   Degenerative lumbar spinal stenosis 07/18/2019   Peripheral neuritis 07/04/2019   Insomnia 07/04/2019   Primary osteoarthritis of both hands 01/23/2018   Primary osteoarthritis of both knees 01/23/2018   Primary osteoarthritis of both feet 01/23/2018   Other idiopathic scoliosis, lumbar region 01/23/2018   DDD  (degenerative disc disease), lumbar 01/23/2018   Subclinical hypothyroidism 08/20/2017   Morbid obesity (HCC) 12/11/2016   Leukocytosis 06/18/2015   Type 2 diabetes mellitus (HCC) 06/16/2015   Elevated lipase 02/18/2014   POSTMENOPAUSAL OSTEOPOROSIS 02/17/2009   Osteoarthritis 03/29/2008   Chronic insomnia 09/14/2007   Hyperlipidemia associated with type 2 diabetes mellitus (HCC) 08/26/2006   DEPRESSION 08/26/2006   Migraine headache 08/26/2006   CATARACT NOS 08/26/2006   Essential hypertension 08/26/2006   SCIATICA 08/26/2006    Orientation RESPIRATION BLADDER Height & Weight     Self, Place, Situation  O2 (See D/C summary) Incontinent, External catheter Weight: 167 lb 12.3 oz (76.1 kg) Height:  5' 9 (175.3 cm)  BEHAVIORAL SYMPTOMS/MOOD NEUROLOGICAL BOWEL NUTRITION STATUS      Continent Diet (See D/C summary)  AMBULATORY STATUS COMMUNICATION OF NEEDS Skin   Extensive Assist Verbally Normal                       Personal Care Assistance Level of Assistance  Bathing, Feeding, Dressing Bathing Assistance: Limited assistance Feeding assistance: Independent Dressing Assistance: Limited assistance     Functional Limitations Info  Sight, Hearing, Speech Sight Info: Adequate Hearing Info: Impaired Speech Info: Adequate    SPECIAL CARE FACTORS FREQUENCY  PT (By licensed PT), OT (By licensed OT)     PT Frequency: 5 times weekly OT Frequency: 5 times weekly            Contractures Contractures Info: Not present    Additional Factors Info  Code Status, Allergies Code Status Info: DNR-Limited Allergies Info: Lyrica  (pregabalin )  Current Medications (08/17/2024):  This is the current hospital active medication list Current Facility-Administered Medications  Medication Dose Route Frequency Provider Last Rate Last Admin   acetaminophen  (TYLENOL ) 160 MG/5ML solution 1,000 mg  1,000 mg Oral Q8H Parker, Alicia C, NP   1,000 mg at 08/17/24 9381    amLODipine  (NORVASC ) tablet 2.5 mg  2.5 mg Oral Daily Tat, David, MD   2.5 mg at 08/17/24 1051   ceFEPIme  (MAXIPIME ) 2 g in sodium chloride  0.9 % 100 mL IVPB  2 g Intravenous Q12H Tat, David, MD 200 mL/hr at 08/17/24 0915 2 g at 08/17/24 0915   Chlorhexidine  Gluconate Cloth 2 % PADS 6 each  6 each Topical Daily Shona Laurence N, DO   6 each at 08/17/24 0913   dexmedetomidine  (PRECEDEX ) 400 MCG/100ML (4 mcg/mL) infusion  0-1.2 mcg/kg/hr Intravenous Titrated Vicci Afton CROME, MD   Stopped at 08/16/24 9081   doxycycline  (VIBRA -TABS) tablet 100 mg  100 mg Oral Q12H TatAlm, MD       enoxaparin  (LOVENOX ) injection 40 mg  40 mg Subcutaneous QHS Shona Laurence N, DO   40 mg at 08/16/24 2205   [START ON 08/18/2024] furosemide  (LASIX ) injection 40 mg  40 mg Intravenous Daily Tat, Alm, MD       LORazepam  (ATIVAN ) injection 0.5 mg  0.5 mg Intravenous Q4H PRN Tat, Alm, MD   0.5 mg at 08/16/24 2210   magnesium  oxide (MAG-OX) tablet 400 mg  400 mg Oral Daily Tat, Alm, MD   400 mg at 08/17/24 1051   phosphorus (K PHOS  NEUTRAL) tablet 500 mg  500 mg Oral Daily Tat, David, MD   500 mg at 08/17/24 1050   polyethylene glycol (MIRALAX  / GLYCOLAX ) packet 17 g  17 g Oral Daily PRN Shona Laurence SAILOR, DO       prochlorperazine  (COMPAZINE ) injection 5 mg  5 mg Intravenous Q6H PRN Shona Laurence N, DO       traMADol  (ULTRAM ) tablet 50 mg  50 mg Oral Q6H PRN Evonnie Alm, MD         Discharge Medications: Please see discharge summary for a list of discharge medications.  Relevant Imaging Results:  Relevant Lab Results:   Additional Information SSN: [id number], [place]     "

## 2024-08-17 NOTE — TOC Initial Note (Addendum)
 Transition of Care [facility]) - Initial/Assessment Note    Patient Details  Name: Deanna Salinas MRN: [id number] Date of Birth: [birth year]  [facility] CM/SW Contact:    Lucie Lunger, LCSWA Phone Number: 08/17/2024, 8:39 AM  Clinical Narrative:                 CSW notes per chart review that pt is high risk for readmission. Csw met with pt and daughters at bedside to complete assessment. Pt is from home with her spouse. Pts spouse assists with all ADLs. Pt has transportation provided by her spouse. Pt has had HH in the past. Pt has a rolling walker to use in the home. CSW spoke with pt and daughters about SNF being recommended. At this time they would like SNF referral sent to [facility] and [facility] for review. CSW to complete referral and send out for review. TOC to follow.     Barriers to Discharge: Continued Medical Work up   Patient Goals and CMS Choice Patient states their goals for this hospitalization and ongoing recovery are:: get better CMS Medicare.gov Compare Post Acute Care list provided to:: Patient Represenative (must comment) Choice offered to / list presented to : Adult Children      Expected Discharge Plan and Services In-house Referral: Clinical Social Work Discharge Planning Services: CM Consult   Living arrangements for the past 2 months: Single Family Home                                      Prior Living Arrangements/Services Living arrangements for the past 2 months: Single Family Home Lives with:: Spouse Patient language and need for interpreter reviewed:: Yes Do you feel safe going back to the place where you live?: Yes      Need for Family Participation in Patient Care: Yes (Comment) Care giver support system in place?: Yes (comment)   Criminal Activity/Legal Involvement Pertinent to Current Situation/Hospitalization: No - Comment as needed  Activities of Daily Living   ADL Screening (condition at time of admission) Independently  performs ADLs?: No Does the patient have a NEW difficulty with bathing/dressing/toileting/self-feeding that is expected to last >3 days?: No Does the patient have a NEW difficulty with getting in/out of bed, walking, or climbing stairs that is expected to last >3 days?: No Does the patient have a NEW difficulty with communication that is expected to last >3 days?: No Is the patient deaf or have difficulty hearing?: Yes Does the patient have difficulty seeing, even when wearing glasses/contacts?: No Does the patient have difficulty concentrating, remembering, or making decisions?: Yes  Permission Sought/Granted                  Emotional Assessment         Alcohol / Substance Use: Not Applicable Psych Involvement: No (comment)  Admission diagnosis:  Acute hypoxemic respiratory failure (HCC) [J96.01] Community acquired pneumonia, unspecified laterality [J18.9] Patient Active Problem List   Diagnosis Date Noted   Acute heart failure with preserved ejection fraction (HFpEF) (HCC) 08/16/2024   Acute respiratory failure with hypoxia and hypercarbia (HCC) 08/15/2024   Lobar pneumonia 08/15/2024   Opioid dependence (HCC) 08/15/2024   Sepsis due to undetermined organism (HCC) 08/15/2024   Acute hypoxemic respiratory failure (HCC) 08/13/2024   Hearing impaired 06/24/2022   Chronic pain 05/20/2022   Bilateral hearing loss 03/09/2021   Myalgia due to statin  03/09/2021   Fatigue fracture of vertebra, site unspecified, initial encounter for fracture 01/19/2021   Other secondary hypertension 01/19/2021   Multiple fractures of ribs, left side, subsequent encounter for fracture with routine healing 01/19/2021   Diabetes mellitus due to underlying condition with hyperglycemia (HCC) 01/19/2021   Community acquired pneumonia of left lower lobe of lung    Rib fractures 01/10/2021   Reactive airway disease 06/04/2020   Degenerative lumbar spinal stenosis 07/18/2019   Peripheral neuritis  07/04/2019   Insomnia 07/04/2019   Primary osteoarthritis of both hands 01/23/2018   Primary osteoarthritis of both knees 01/23/2018   Primary osteoarthritis of both feet 01/23/2018   Other idiopathic scoliosis, lumbar region 01/23/2018   DDD (degenerative disc disease), lumbar 01/23/2018   Subclinical hypothyroidism 08/20/2017   Morbid obesity (HCC) 12/11/2016   Leukocytosis 06/18/2015   Type 2 diabetes mellitus (HCC) 06/16/2015   Elevated lipase 02/18/2014   POSTMENOPAUSAL OSTEOPOROSIS 02/17/2009   Osteoarthritis 03/29/2008   Chronic insomnia 09/14/2007   Hyperlipidemia associated with type 2 diabetes mellitus (HCC) 08/26/2006   DEPRESSION 08/26/2006   Migraine headache 08/26/2006   CATARACT NOS 08/26/2006   Essential hypertension 08/26/2006   SCIATICA 08/26/2006   PCP:  [name], MD Pharmacy:   [facility] - [place], [place] - [address] [place] [address] Phone: [phone] Fax: [phone]     Social Drivers of Health (SDOH) Social History: SDOH Screenings   Food Insecurity: No Food Insecurity (08/13/2024)  Housing: Low Risk (08/13/2024)  Transportation Needs: No Transportation Needs (08/13/2024)  Utilities: Not At Risk (08/13/2024)  Alcohol Screen: Low Risk (05/20/2022)  Depression (PHQ2-9): High Risk (07/16/2024)  Financial Resource Strain: Low Risk (10/23/2023)   Received from [facility]   Physical Activity: Inactive (10/23/2023)   Received from [facility]   Social Connections: Moderately Isolated (08/13/2024)  Stress: Stress Concern Present (10/23/2023)   Received from [facility]   Tobacco Use: Low Risk (08/13/2024)   SDOH Interventions:     Readmission Risk Interventions    08/17/2024    8:39 AM  Readmission Risk Prevention Plan  Transportation Screening Complete  HRI or Home Care Consult Complete  Social Work Consult for Recovery Care  Planning/Counseling Complete  Palliative Care Screening Complete  Medication Review Oceanographer) Complete

## 2024-08-17 NOTE — Progress Notes (Signed)
 RE: Deanna Salinas DOB: 12-02-1935 Date: 08/17/2024  MUST ID: [id number]  To Whom It May Concern:   Please be advised that the above name patient will require a short-term nursing home stay- anticipated 30 days or less rehabilitation and strengthening. The plan is for return home.

## 2024-08-18 DIAGNOSIS — J9601 Acute respiratory failure with hypoxia: Secondary | ICD-10-CM | POA: Diagnosis not present

## 2024-08-18 DIAGNOSIS — J9602 Acute respiratory failure with hypercapnia: Secondary | ICD-10-CM | POA: Diagnosis not present

## 2024-08-18 DIAGNOSIS — A419 Sepsis, unspecified organism: Secondary | ICD-10-CM | POA: Diagnosis not present

## 2024-08-18 DIAGNOSIS — J181 Lobar pneumonia, unspecified organism: Secondary | ICD-10-CM | POA: Diagnosis not present

## 2024-08-18 DIAGNOSIS — I5031 Acute diastolic (congestive) heart failure: Secondary | ICD-10-CM | POA: Diagnosis not present

## 2024-08-18 LAB — CULTURE, BLOOD (ROUTINE X 2)
Culture: NO GROWTH
Culture: NO GROWTH
Special Requests: ADEQUATE

## 2024-08-18 LAB — MAGNESIUM: Magnesium: 1.8 mg/dL (ref 1.7–2.4)

## 2024-08-18 LAB — BASIC METABOLIC PANEL WITH GFR
Anion gap: 4 — ABNORMAL LOW (ref 5–15)
BUN: 11 mg/dL (ref 8–23)
CO2: 42 mmol/L — ABNORMAL HIGH (ref 22–32)
Calcium: 9 mg/dL (ref 8.9–10.3)
Chloride: 96 mmol/L — ABNORMAL LOW (ref 98–111)
Creatinine, Ser: 0.63 mg/dL (ref 0.44–1.00)
GFR, Estimated: >60 mL/min
Glucose, Bld: 93 mg/dL (ref 70–99)
Potassium: 3.3 mmol/L — ABNORMAL LOW (ref 3.5–5.1)
Sodium: 142 mmol/L (ref 135–145)

## 2024-08-18 LAB — GLUCOSE, CAPILLARY
Glucose-Capillary: 103 mg/dL — ABNORMAL HIGH (ref 70–99)
Glucose-Capillary: 121 mg/dL — ABNORMAL HIGH (ref 70–99)
Glucose-Capillary: 121 mg/dL — ABNORMAL HIGH (ref 70–99)
Glucose-Capillary: 127 mg/dL — ABNORMAL HIGH (ref 70–99)

## 2024-08-18 LAB — CBC
HCT: 45.2 % (ref 36.0–46.0)
Hemoglobin: 14.8 g/dL (ref 12.0–15.0)
MCH: 33.7 pg (ref 26.0–34.0)
MCHC: 32.7 g/dL (ref 30.0–36.0)
MCV: 103 fL — ABNORMAL HIGH (ref 80.0–100.0)
Platelets: 284 K/uL (ref 150–400)
RBC: 4.39 MIL/uL (ref 3.87–5.11)
RDW: 13.6 % (ref 11.5–15.5)
WBC: 10 K/uL (ref 4.0–10.5)
nRBC: 0 % (ref 0.0–0.2)

## 2024-08-18 LAB — PHOSPHORUS: Phosphorus: 2.5 mg/dL (ref 2.5–4.6)

## 2024-08-18 MED ORDER — POTASSIUM CHLORIDE CRYS ER 20 MEQ PO TBCR
40.0000 meq | EXTENDED_RELEASE_TABLET | Freq: Once | ORAL | Status: AC
  Administered 2024-08-18: 40 meq via ORAL
  Filled 2024-08-18: qty 2

## 2024-08-18 MED ORDER — LIDOCAINE 5 % EX PTCH
2.0000 | MEDICATED_PATCH | CUTANEOUS | Status: DC
  Administered 2024-08-18 – 2024-08-19 (×2): 2 via TRANSDERMAL
  Filled 2024-08-18 (×2): qty 2

## 2024-08-18 MED ORDER — MAGNESIUM OXIDE -MG SUPPLEMENT 400 (240 MG) MG PO TABS
400.0000 mg | ORAL_TABLET | Freq: Two times a day (BID) | ORAL | Status: DC
  Administered 2024-08-18 – 2024-08-20 (×4): 400 mg via ORAL
  Filled 2024-08-18 (×4): qty 1

## 2024-08-18 MED ORDER — K PHOS MONO-SOD PHOS DI & MONO 155-852-130 MG PO TABS
500.0000 mg | ORAL_TABLET | Freq: Two times a day (BID) | ORAL | Status: DC
  Administered 2024-08-18 – 2024-08-19 (×2): 500 mg via ORAL
  Filled 2024-08-18 (×2): qty 2

## 2024-08-18 NOTE — Plan of Care (Signed)
   Problem: Education: Goal: Knowledge of General Education information will improve Description Including pain rating scale, medication(s)/side effects and non-pharmacologic comfort measures Outcome: Progressing   Problem: Health Behavior/Discharge Planning: Goal: Ability to manage health-related needs will improve Outcome: Progressing

## 2024-08-18 NOTE — Progress Notes (Signed)
 "          PROGRESS NOTE  Deanna Salinas FMW:[id number] DOB: [birth year] DOA: 08/13/2024 PCP: [name], MD  Brief History:  89 y.o. female with medical history significant for chronic pain syndrome, chronic insomnia, essential hypertension, hyperlipidemia, type 2 diabetes,  hearing impairment, chronic HFpEF, who presents to the ER via EMS with respiratory distress after being found minimally responsive at home by family.     Reportedly, the patient may have accidentally overdosed with home hydrocodone .  Per her daughter at bedside, her home tramadol  was changed to hydrocodone  a week ago, because tramadol  was no longer effective in controlling her generalized pain.  She had her chronic pain pills refilled 2 days ago.     For the past 2 days PTA she has been more somnolent.  Had a mild cough.  No reports of fevers or chills.  EMS was activated.  Upon EMS arrival, the patient had O2 saturation in the 40s, per EMS.  She was initially placed on nonrebreather mask then switched to CPAP en route and brought into the ER for further evaluation.   In the ER, O2 saturation in the 70s, BiPAP was applied.  Chest x-ray revealed diffuse infiltrates bilaterally, right greater than left.  She received Rocephin  and IV azithromycin  for community-acquired pneumonia and possible aspiration pneumonia.  TR[facility], hospitalist service, was asked admit.   Of note, the patient did not receive Narcan and is currently more alert.   ED Course: Temperature 96.2.  BP 114/64, pulse 79, respiratory 21, saturation 100% on room BiPAP with FiO2 of 80%.  The patient was transitioned to [facility] at 20L @ 70% FiO2.  She was started on IV abx.  Palliative medicine was consulted to discuss GOC with family.  The patient was initially very agitated requiring Precedex  infusion.  Gradually, the Precedex  has been weaned as the patient's agitation improved. She was noted to be fluid overloaded.  IV fluids were stopped and the patient was started on  IV furosemide .  She became more euvolemic and her oxygen  demand was weaned down to 2L [place] on 08/17/24.  Her abx were continued and she finished 7 days for PNA.   Assessment/Plan: Acute respiratory failure with hypoxia and hypercarbia -Secondary to pneumonia and  fluid overload - Currently on HFNC 20L@0 .70>>HFNC at 11L>>HFNC 9L>>2L - Wean oxygen  as tolerated - 1/8--personally reviewed CXR--bilateral pleural effusions, patchy RUL opacity, vascular congestion   Severe Sepsis -due to PNA -presented with tachycardia, tachypnea, resp failure - Lactic 2.0>>1.2 - initially fluid resuscitated - continue IV abx - sepsis physiology now resolved   Aspiration pneumonia/lobar pneumonia - initially azithromycin >>IV doxy - Broadend antibiotic coverage to cefepime  -viral resp panel--neg -COVID/RSV/Flu--neg - speech therapy eval --dys 1>>dys 3 - MRSA neg   Acute HFpEF - continues to have signs of fluid overload - Personally reviewed chest x-ray--small bilateral pleural effusions, R-sided infiltrates -08/15/24 echo EF 50-55%, no WMA, G1DD, normal RVF, mild MR, mild-mod TR -continue IV lasix  - 3400 cc UO last 24 hours; NEG 6.7L total   Opioid dependence - PDMP reviewed -norco 10/325, #42 filled 08/11/24 -norco 5/325, #90 filled 08/03/24 -tramadol  50 mg, #120 08/03/24 - Minimize opioids, but will not discontinue due to chronic usage - UDS positive for opiates and benzodiazepines - tylenol  and tramadol  for now   Elevated troponin -Secondary to demand ischemia - Troponin 23>> 26   Acute metabolic encephalopathy - Multifactorial including opioids, respiratory failure, infectious process - B12--3970 - Folic acid --9.7 - TSH--1.470 -  CT brain negative for acute findings - requiring precedex  for sedation initially>>weaned off 1/8 - 1/10--mental status near baseline   Hypokalemia - Repleted -mag 1.9   Goals of care - Currently DNR/DNI - 1/8 discussed with daughters 1/8>>not ready for comfort,  but agree with DNR/DNI - Appreciate palliative medicine--discussed with [name]         Family Communication:   daughters at bedside 1/9   Consultants:  palliative   Code Status:  DNR   DVT Prophylaxis:   [place] Lovenox      Procedures: As Listed in Progress Note Above   Antibiotics: Ceftriaxone  1/5>>1/7 Azithro 1/5>>1/6 Doxy 1/7>> -cefepime  1/5>>           Subjective: Patient denies fevers, chills, headache, chest pain, dyspnea, nausea, vomiting, diarrhea, abdominal pain, dysuria, hematuria, hematochezia, and melena.   Objective: Vitals:   08/17/24 1813 08/17/24 1943 08/18/24 0524 08/18/24 1417  BP: (!) 140/69 (!) 144/88 (!) 159/85 (!) 144/75  Pulse: 90 93 75 99  Resp: 18 16 18    Temp: 97.7 F (36.5 C) 98 F (36.7 C) 98.9 F (37.2 C) 98.3 F (36.8 C)  TempSrc: Oral Oral  Oral  SpO2: 93% 92% 90% 90%  Weight:      Height:        Intake/Output Summary (Last 24 hours) at 08/18/2024 1734 Last data filed at 08/18/2024 1119 Gross per 24 hour  Intake 360 ml  Output 2300 ml  Net -1940 ml   Weight change:  Exam:  General:  Pt is alert, follows commands appropriately, not in acute distress HEENT: No icterus, No thrush, No neck mass, [place]/AT Cardiovascular: RRR, S1/S2, no rubs, no gallops Respiratory: fine bibasilar crackles.  No wheeze Abdomen: Soft/+BS, non tender, non distended, no guarding Extremities: trace LE edema, No lymphangitis, No petechiae, No rashes, no synovitis   Data Reviewed: I have personally reviewed following labs and imaging studies Basic Metabolic Panel: Recent Labs  Lab 08/14/24 1001 08/15/24 0431 08/16/24 0448 08/17/24 0532 08/18/24 0506  NA 138 137 140 141 142  K 5.0 4.8 3.7 3.4* 3.3*  CL 99 100 101 97* 96*  CO2 31 31 33* 27 42*  GLUCOSE 117* 100* 121* 83 93  BUN 40* 40* 32* 18 11  CREATININE 1.15* 0.78 0.72 0.60 0.63  CALCIUM  8.9 8.6* 8.3* 8.8* 9.0  MG  --   --  2.2 1.9 1.8  PHOS  --   --   --  2.7 2.5   Liver  Function Tests: Recent Labs  Lab 08/13/24 1713 08/16/24 0448  AST 25 21  ALT 15 15  ALKPHOS 110 75  BILITOT 0.4 0.3  PROT 7.2 5.3*  ALBUMIN 4.3 3.4*   No results for input(s): LIPASE, AMYLASE in the last 168 hours. Recent Labs  Lab 08/14/24 1820  AMMONIA 34   Coagulation Profile: No results for input(s): INR, PROTIME in the last 168 hours. CBC: Recent Labs  Lab 08/13/24 1713 08/15/24 0431 08/16/24 0448 08/18/24 0506  WBC 11.2* 7.3 7.5 10.0  NEUTROABS 9.3* 5.9  --   --   HGB 13.5 12.2 12.4 14.8  HCT 43.8 37.6 38.2 45.2  MCV 108.4* 105.9* 104.4* 103.0*  PLT 288 207 229 284   Cardiac Enzymes: No results for input(s): CKTOTAL, CKMB, CKMBINDEX, TROPONINI in the last 168 hours. BNP: Invalid input(s): POCBNP CBG: Recent Labs  Lab 08/17/24 1830 08/17/24 2344 08/18/24 0522 08/18/24 1115 08/18/24 1632  GLUCAP 162* 134* 103* 121* 121*   HbA1C: No results  for input(s): HGBA1C in the last 72 hours. Urine analysis:    Component Value Date/Time   COLORURINE YELLOW 12/20/2020 1154   APPEARANCEUR CLEAR 12/20/2020 1154   LABSPEC 1.016 12/20/2020 1154   PHURINE 5.0 12/20/2020 1154   GLUCOSEU NEGATIVE 12/20/2020 1154   HGBUR MODERATE (A) 12/20/2020 1154   BILIRUBINUR NEGATIVE 12/20/2020 1154   KETONESUR NEGATIVE 12/20/2020 1154   PROTEINUR NEGATIVE 12/20/2020 1154   NITRITE NEGATIVE 12/20/2020 1154   LEUKOCYTESUR NEGATIVE 12/20/2020 1154   Sepsis Labs: @LABRCNTIP (procalcitonin:4,lacticidven:4) ) Recent Results (from the past 240 hours)  Culture, blood (routine x 2)     Status: None   Collection Time: 08/13/24  5:03 PM   Specimen: BLOOD  Result Value Ref Range Status   Specimen Description BLOOD  Final   Special Requests NONE  Final   Culture   Final    NO GROWTH 5 DAYS Performed at [facility], [address] [place], [place] [address]    Report Status 08/18/2024 FINAL  Final  Resp panel by RT-PCR (RSV, Flu A&B, Covid) Anterior Nasal  Swab     Status: None   Collection Time: 08/13/24  5:18 PM   Specimen: Anterior Nasal Swab  Result Value Ref Range Status   SARS Coronavirus 2 by RT PCR NEGATIVE NEGATIVE Final    Comment: (NOTE) SARS-CoV-2 target nucleic acids are NOT DETECTED.  The SARS-CoV-2 RNA is generally detectable in upper respiratory specimens during the acute phase of infection. The lowest concentration of SARS-CoV-2 viral copies this assay can detect is 138 copies/mL. A negative result does not preclude SARS-Cov-2 infection and should not be used as the sole basis for treatment or other patient management decisions. A negative result may occur with  improper specimen collection/handling, submission of specimen other than nasopharyngeal swab, presence of viral mutation(s) within the areas targeted by this assay, and inadequate number of viral copies(<138 copies/mL). A negative result must be combined with clinical observations, patient history, and epidemiological information. The expected result is Negative.  Fact Sheet for Patients:  bloggercourse.com  Fact Sheet for Healthcare Providers:  seriousbroker.it  This test is no t yet approved or cleared by the [place]  FDA and  has been authorized for detection and/or diagnosis of SARS-CoV-2 by FDA under an Emergency Use Authorization (EUA). This EUA will remain  in effect (meaning this test can be used) for the duration of the COVID-19 declaration under Section 564(b)(1) of the Act, 21 U.S.C.section 360bbb-3(b)(1), unless the authorization is terminated  or revoked sooner.       Influenza A by PCR NEGATIVE NEGATIVE Final   Influenza B by PCR NEGATIVE NEGATIVE Final    Comment: (NOTE) The Xpert Xpress SARS-CoV-2/FLU/RSV plus assay is intended as an aid in the diagnosis of influenza from Nasopharyngeal swab specimens and should not be used as a sole basis for treatment. Nasal washings and aspirates  are unacceptable for Xpert Xpress SARS-CoV-2/FLU/RSV testing.  Fact Sheet for Patients: bloggercourse.com  Fact Sheet for Healthcare Providers: seriousbroker.it  This test is not yet approved or cleared by the [place]  FDA and has been authorized for detection and/or diagnosis of SARS-CoV-2 by FDA under an Emergency Use Authorization (EUA). This EUA will remain in effect (meaning this test can be used) for the duration of the COVID-19 declaration under Section 564(b)(1) of the Act, 21 U.S.C. section 360bbb-3(b)(1), unless the authorization is terminated or revoked.     Resp Syncytial Virus by PCR NEGATIVE NEGATIVE Final    Comment: (NOTE) Fact  Sheet for Patients: bloggercourse.com  Fact Sheet for Healthcare Providers: seriousbroker.it  This test is not yet approved or cleared by the [place]  FDA and has been authorized for detection and/or diagnosis of SARS-CoV-2 by FDA under an Emergency Use Authorization (EUA). This EUA will remain in effect (meaning this test can be used) for the duration of the COVID-19 declaration under Section 564(b)(1) of the Act, 21 U.S.C. section 360bbb-3(b)(1), unless the authorization is terminated or revoked.  Performed at [facility], [address] [place], [place] [address]   Culture, blood (routine x 2)     Status: None   Collection Time: 08/13/24  5:30 PM   Specimen: BLOOD  Result Value Ref Range Status   Specimen Description BLOOD RIGHT ANTECUBITAL  Final   Special Requests   Final    BOTTLES DRAWN AEROBIC AND ANAEROBIC Blood Culture adequate volume   Culture   Final    NO GROWTH 5 DAYS Performed at [facility], [address] [place], [place] [address]    Report Status 08/18/2024 FINAL  Final  MRSA Next Gen by PCR, Nasal     Status: None   Collection Time: 08/13/24  8:55 PM   Specimen: Nasal Mucosa; Nasal Swab  Result  Value Ref Range Status   MRSA by PCR Next Gen NOT DETECTED NOT DETECTED Final    Comment: (NOTE) The GeneXpert MRSA Assay (FDA approved for NASAL specimens only), is one component of a comprehensive MRSA colonization surveillance program. It is not intended to diagnose MRSA infection nor to guide or monitor treatment for MRSA infections. Test performance is not FDA approved in patients less than 36 years old. Performed at [facility], [address], [place], [place] [address]   Respiratory (~20 pathogens) panel by PCR     Status: None   Collection Time: 08/15/24  5:25 PM   Specimen: Nasopharyngeal Swab; Respiratory  Result Value Ref Range Status   Adenovirus NOT DETECTED NOT DETECTED Final   Coronavirus 229E NOT DETECTED NOT DETECTED Final    Comment: (NOTE) The Coronavirus on the Respiratory Panel, DOES NOT test for the novel  Coronavirus (2019 nCoV)    Coronavirus HKU1 NOT DETECTED NOT DETECTED Final   Coronavirus NL63 NOT DETECTED NOT DETECTED Final   Coronavirus OC43 NOT DETECTED NOT DETECTED Final   Metapneumovirus NOT DETECTED NOT DETECTED Final   Rhinovirus / Enterovirus NOT DETECTED NOT DETECTED Final   Influenza A NOT DETECTED NOT DETECTED Final   Influenza B NOT DETECTED NOT DETECTED Final   Parainfluenza Virus 1 NOT DETECTED NOT DETECTED Final   Parainfluenza Virus 2 NOT DETECTED NOT DETECTED Final   Parainfluenza Virus 3 NOT DETECTED NOT DETECTED Final   Parainfluenza Virus 4 NOT DETECTED NOT DETECTED Final   Respiratory Syncytial Virus NOT DETECTED NOT DETECTED Final   Bordetella pertussis NOT DETECTED NOT DETECTED Final   Bordetella Parapertussis NOT DETECTED NOT DETECTED Final   Chlamydophila pneumoniae NOT DETECTED NOT DETECTED Final   Mycoplasma pneumoniae NOT DETECTED NOT DETECTED Final    Comment: Performed at [facility], [address] [place], [place] [address]     Scheduled Meds:  acetaminophen  (TYLENOL ) oral liquid 160 mg/5 mL  1,000 mg Oral  Q8H   amLODipine   2.5 mg Oral Daily   Chlorhexidine  Gluconate Cloth  6 each Topical Daily   doxycycline   100 mg Oral Q12H   enoxaparin  (LOVENOX ) injection  40 mg Subcutaneous QHS   furosemide   40 mg Intravenous Daily  lidocaine   2 patch Transdermal Q24H   magnesium  oxide  400 mg Oral BID   phosphorus  500 mg Oral BID   Continuous Infusions:  ceFEPime  (MAXIPIME ) IV 2 g (08/18/24 0905)   dexmedetomidine  (PRECEDEX ) IV infusion Stopped (08/16/24 0918)    Procedures/Studies: DG CHEST PORT 1 VIEW Result Date: 08/16/2024 EXAM: 1 VIEW(S) XRAY OF THE CHEST 08/16/2024 04:20:00 AM COMPARISON: Portable chest 08/13/2024. CLINICAL HISTORY: 427266 Acute respiratory failure with hypoxia (HCC) 427266 Acute respiratory failure with hypoxia (HCC) 427266 FINDINGS: LUNGS AND PLEURA: Mild perihilar vascular congestion. No appreciable interstitial edema. Small to moderate pleural effusions. Patchy right upper lobe airspace disease. Hazy atelectasis or consolidation in the lower lung fields alongside the effusions. Left upper lung field is clear. Pleural effusions appear to have increased since the prior study. No pneumothorax. HEART AND MEDIASTINUM: Cardiomegaly. Stable mediastinum with widening and aortic tortuosity and atherosclerosis. BONES AND SOFT TISSUES: Advanced arthrosis both shoulders. No acute osseous abnormality. In all other respects no further changes. IMPRESSION: 1. Small to moderate pleural effusions, increased since the prior study. 2. Patchy right upper lobe airspace disease and hazy atelectasis or consolidation in the lower lung fields alongside the effusions. 3. Cardiomegaly and mild perihilar vascular congestion. Electronically signed by: Francis Quam MD 08/16/2024 05:47 AM EST RP Workstation: [id number]   ECHOCARDIOGRAM COMPLETE Result Date: 08/15/2024    ECHOCARDIOGRAM REPORT   Patient Name:   Deanna Salinas Date of Exam: 08/15/2024 Medical Rec #:  [id number]       Height:       69.0 in Accession  #:    [id number]      Weight:       167.8 lb Date of Birth:  02-19-1936        BSA:          1.917 m Patient Age:    88 years        BP:           113/74 mmHg Patient Gender: F               HR:           81 bpm. Exam [facility]:  [facility] Procedure: 2D Echo, Color Doppler and Cardiac Doppler (Both Spectral and Color            Flow Doppler were utilized during procedure). Indications:    CHF-Acute Diastolic I50.31  History:        Patient has prior history of Echocardiogram examinations, most                 recent 01/14/2021. Risk Factors:Diabetes and Hypertension.  Sonographer:    Sydnee` Wilson RDCS Referring Phys: [id number] CAROLE N HALL IMPRESSIONS  1. Left ventricular ejection fraction, by estimation, is 50 to 55%. The left ventricle has low normal function. The left ventricle has no regional wall motion abnormalities. There is mild asymmetric left ventricular hypertrophy of the basal segment. Left ventricular diastolic parameters are consistent with Grade I diastolic dysfunction (impaired relaxation).  2. Right ventricular systolic function is normal. The right ventricular size is normal. There is moderately elevated pulmonary artery systolic pressure. The estimated right ventricular systolic pressure is 53.2 mmHg.  3. The mitral valve is grossly normal. Mild mitral valve regurgitation.  4. Tricuspid valve regurgitation is mild to moderate.  5. The aortic valve was not well visualized. There is mild calcification of the aortic valve. Aortic valve regurgitation is not visualized. Aortic valve mean gradient measures 4.0 mmHg.  6. The inferior vena cava is dilated in size with >50% respiratory variability, suggesting right atrial pressure of 8 mmHg. Comparison(s): LVEF 50-55% with grade 1 diastolic dysfunction. Moderately elevated RVSP 53 mmHg. Mild mitral regurgitation. Mild to moderate tricuspid regurgitation. FINDINGS  Left Ventricle: Left ventricular ejection fraction, by estimation, is 50 to 55%. The left  ventricle has low normal function. The left ventricle has no regional wall motion abnormalities. The left ventricular internal cavity size was normal in size. There is mild asymmetric left ventricular hypertrophy of the basal segment. Left ventricular diastolic parameters are consistent with Grade I diastolic dysfunction (impaired relaxation). Right Ventricle: The right ventricular size is normal. No increase in right ventricular wall thickness. Right ventricular systolic function is normal. There is moderately elevated pulmonary artery systolic pressure. The tricuspid regurgitant velocity is 3.36 m/s, and with an assumed right atrial pressure of 8 mmHg, the estimated right ventricular systolic pressure is 53.2 mmHg. Left Atrium: Left atrial size was normal in size. Right Atrium: Right atrial size was normal in size. Pericardium: There is no evidence of pericardial effusion. Mitral Valve: The mitral valve is grossly normal. Mild mitral valve regurgitation. Tricuspid Valve: The tricuspid valve is grossly normal. Tricuspid valve regurgitation is mild to moderate. Aortic Valve: The aortic valve was not well visualized. There is mild calcification of the aortic valve. Aortic valve regurgitation is not visualized. Aortic valve mean gradient measures 4.0 mmHg. Aortic valve peak gradient measures 7.0 mmHg. Aortic valve area, by VTI measures 2.54 cm. Pulmonic Valve: The pulmonic valve was not well visualized. Pulmonic valve regurgitation is trivial. Aorta: The aortic root and ascending aorta are structurally normal, with no evidence of dilitation. Venous: The inferior vena cava is dilated in size with greater than 50% respiratory variability, suggesting right atrial pressure of 8 mmHg. IAS/Shunts: No atrial level shunt detected by color flow Doppler. Additional Comments: 3D was performed not requiring image post processing on an independent workstation and was indeterminate.  LEFT VENTRICLE PLAX 2D LVIDd:         3.80 cm      Diastology LVIDs:         3.20 cm     LV e' medial:    5.00 cm/s LV PW:         1.00 cm     LV E/e' medial:  15.6 LV IVS:        1.30 cm     LV e' lateral:   8.16 cm/s LVOT diam:     2.00 cm     LV E/e' lateral: 9.6 LV SV:         81 LV SV Index:   42 LVOT Area:     3.14 cm  LV Volumes (MOD) LV vol d, MOD A2C: 70.7 ml LV vol d, MOD A4C: 81.5 ml LV vol s, MOD A2C: 22.4 ml LV vol s, MOD A4C: 33.5 ml LV SV MOD A2C:     48.3 ml LV SV MOD A4C:     81.5 ml LV SV MOD BP:      48.9 ml RIGHT VENTRICLE RV S prime:     12.40 cm/s TAPSE (M-mode): 1.6 cm LEFT ATRIUM             Index        RIGHT ATRIUM           Index LA diam:        3.10 cm 1.62 cm/m   RA Area:  12.90 cm LA Vol (A2C):   37.0 ml 19.30 ml/m  RA Volume:   30.20 ml  15.75 ml/m LA Vol (A4C):   38.8 ml 20.24 ml/m LA Biplane Vol: 39.3 ml 20.50 ml/m  AORTIC VALVE AV Area (Vmax):    2.78 cm AV Area (Vmean):   2.49 cm AV Area (VTI):     2.54 cm AV Vmax:           132.00 cm/s AV Vmean:          96.200 cm/s AV VTI:            0.318 m AV Peak Grad:      7.0 mmHg AV Mean Grad:      4.0 mmHg LVOT Vmax:         117.00 cm/s LVOT Vmean:        76.100 cm/s LVOT VTI:          0.257 m LVOT/AV VTI ratio: 0.81  AORTA Ao Root diam: 2.70 cm Ao Asc diam:  3.00 cm MITRAL VALVE                TRICUSPID VALVE MV Area (PHT): 2.62 cm     TR Peak grad:   45.2 mmHg MV Decel Time: 290 msec     TR Vmax:        336.00 cm/s MV E velocity: 78.10 cm/s MV A velocity: 122.00 cm/s  SHUNTS MV E/A ratio:  0.64         Systemic VTI:  0.26 m                             Systemic Diam: 2.00 cm Jayson Sierras MD Electronically signed by Jayson Sierras MD Signature Date/Time: 08/15/2024/1:15:00 PM    Final    CT HEAD WO CONTRAST ( ) Result Date: 08/14/2024 EXAM: CT HEAD WITHOUT CONTRAST 08/14/2024 03:19:06 PM TECHNIQUE: CT of the head was performed without the administration of intravenous contrast. Automated exposure control, iterative reconstruction, and/or weight based adjustment of the  mA/kV was utilized to reduce the radiation dose to as low as reasonably achievable. COMPARISON: CT head 01/13/2021 CLINICAL HISTORY: Mental status change, unknown cause FINDINGS: Moderately motion limited study. BRAIN AND VENTRICLES: No acute hemorrhage. No evidence of acute infarct. No hydrocephalus. No extra-axial collection. No mass effect or midline shift. ORBITS: No acute abnormality. SINUSES: No acute abnormality. SOFT TISSUES AND SKULL: No acute soft tissue abnormality. No skull fracture. IMPRESSION: 1. Moderately motion limited study without obvious acute abnormality. Electronically signed by: Gilmore Molt 08/14/2024 11:10 PM EST RP Workstation: [id number]   DG Chest Portable 1 View Result Date: 08/13/2024 EXAM: 1 VIEW(S) XRAY OF THE CHEST 08/13/2024 05:21:00 PM COMPARISON: Comparison with 09/20/2022. CLINICAL HISTORY: SOB. FINDINGS: LUNGS AND PLEURA: Shallow inspiration. Elevation of the left hemidiaphragm. Interval development of airspace infiltrates in the right upper lung and right lower lung most likely representing pneumonia. Follow up to resolution is recommended to exclude an underlying obstructing lesion. Small bilateral pleural effusions. Probable emphysematous changes in the lungs. No pneumothorax. HEART AND MEDIASTINUM: Cardiac enlargement. Mediastinal contours appear intact. Calcification of the aorta. BONES AND SOFT TISSUES: Degenerative changes in the spine and shoulders. IMPRESSION: 1. Interval development of airspace infiltrates in the right upper lung and right lower lung, most likely representing pneumonia. Follow-up to resolution is recommended to exclude an underlying obstructing lesion. 2. Small bilateral pleural effusions. 3. Cardiac enlargement. 4. Probable emphysematous changes in the  lungs. Electronically signed by: Elsie Gravely MD 08/13/2024 06:24 PM EST RP Workstation: [id number]    Alm Schneider, DO  [facility]  If 7PM-7AM, please contact  night-coverage [email or web address] Password [facility] 08/18/2024, 5:34 PM   LOS: 5 days   "

## 2024-08-19 DIAGNOSIS — J9601 Acute respiratory failure with hypoxia: Secondary | ICD-10-CM | POA: Diagnosis not present

## 2024-08-19 DIAGNOSIS — A419 Sepsis, unspecified organism: Secondary | ICD-10-CM | POA: Diagnosis not present

## 2024-08-19 DIAGNOSIS — J181 Lobar pneumonia, unspecified organism: Secondary | ICD-10-CM | POA: Diagnosis not present

## 2024-08-19 DIAGNOSIS — J9602 Acute respiratory failure with hypercapnia: Secondary | ICD-10-CM | POA: Diagnosis not present

## 2024-08-19 DIAGNOSIS — I5031 Acute diastolic (congestive) heart failure: Secondary | ICD-10-CM | POA: Diagnosis not present

## 2024-08-19 LAB — BASIC METABOLIC PANEL WITH GFR
Anion gap: 4 — ABNORMAL LOW (ref 5–15)
BUN: 14 mg/dL (ref 8–23)
CO2: 40 mmol/L — ABNORMAL HIGH (ref 22–32)
Calcium: 8.8 mg/dL — ABNORMAL LOW (ref 8.9–10.3)
Chloride: 95 mmol/L — ABNORMAL LOW (ref 98–111)
Creatinine, Ser: 0.69 mg/dL (ref 0.44–1.00)
GFR, Estimated: >60 mL/min
Glucose, Bld: 93 mg/dL (ref 70–99)
Potassium: 3.4 mmol/L — ABNORMAL LOW (ref 3.5–5.1)
Sodium: 139 mmol/L (ref 135–145)

## 2024-08-19 LAB — GLUCOSE, CAPILLARY
Glucose-Capillary: 92 mg/dL (ref 70–99)
Glucose-Capillary: 127 mg/dL — ABNORMAL HIGH (ref 70–99)
Glucose-Capillary: 139 mg/dL — ABNORMAL HIGH (ref 70–99)

## 2024-08-19 LAB — PHOSPHORUS: Phosphorus: 3.5 mg/dL (ref 2.5–4.6)

## 2024-08-19 LAB — MAGNESIUM: Magnesium: 1.8 mg/dL (ref 1.7–2.4)

## 2024-08-19 MED ORDER — FUROSEMIDE 40 MG PO TABS
40.0000 mg | ORAL_TABLET | Freq: Every day | ORAL | Status: DC
  Administered 2024-08-20: 40 mg via ORAL
  Filled 2024-08-19: qty 1

## 2024-08-19 MED ORDER — POTASSIUM CHLORIDE CRYS ER 20 MEQ PO TBCR
40.0000 meq | EXTENDED_RELEASE_TABLET | Freq: Once | ORAL | Status: AC
  Administered 2024-08-19: 40 meq via ORAL

## 2024-08-19 MED ORDER — ACETAMINOPHEN 500 MG PO TABS
1000.0000 mg | ORAL_TABLET | Freq: Three times a day (TID) | ORAL | Status: DC
  Administered 2024-08-19 – 2024-08-20 (×2): 1000 mg via ORAL
  Filled 2024-08-19 (×2): qty 2

## 2024-08-19 MED ORDER — LIDOCAINE 5 % EX PTCH: 2.0000 | MEDICATED_PATCH | CUTANEOUS | Status: DC

## 2024-08-19 MED ORDER — TRAMADOL HCL 50 MG PO TABS: 50.0000 mg | ORAL_TABLET | Freq: Four times a day (QID) | ORAL | 0 refills | Status: DC | PRN

## 2024-08-19 NOTE — Progress Notes (Signed)
 "          PROGRESS NOTE  Deanna Salinas FMW:[id number] DOB: 07/16/1936 DOA: 08/13/2024 PCP: [name], MD  Brief History:  89 y.o. female with medical history significant for chronic pain syndrome, chronic insomnia, essential hypertension, hyperlipidemia, type 2 diabetes,  hearing impairment, chronic HFpEF, who presents to the ER via EMS with respiratory distress after being found minimally responsive at home by family.     Reportedly, the patient may have accidentally overdosed with home hydrocodone .  Per her daughter at bedside, her home tramadol  was changed to hydrocodone  a week ago, because tramadol  was no longer effective in controlling her generalized pain.  She had her chronic pain pills refilled 2 days ago.     For the past 2 days PTA she has been more somnolent.  Had a mild cough.  No reports of fevers or chills.  EMS was activated.  Upon EMS arrival, the patient had O2 saturation in the 40s, per EMS.  She was initially placed on nonrebreather mask then switched to CPAP en route and brought into the ER for further evaluation.   In the ER, O2 saturation in the 70s, BiPAP was applied.  Chest x-ray revealed diffuse infiltrates bilaterally, right greater than left.  She received Rocephin  and IV azithromycin  for community-acquired pneumonia and possible aspiration pneumonia.  TR[facility], hospitalist service, was asked admit.   Of note, the patient did not receive Narcan and is currently more alert.   ED Course: Temperature 96.2.  BP 114/64, pulse 79, respiratory 21, saturation 100% on room BiPAP with FiO2 of 80%.  The patient was transitioned to [facility] at 20L @ 70% FiO2.  She was started on IV abx.  Palliative medicine was consulted to discuss GOC with family.  The patient was initially very agitated requiring Precedex  infusion.  Gradually, the Precedex  has been weaned as the patient's agitation improved. She was noted to be fluid overloaded.  IV fluids were stopped and the patient was started on  IV furosemide .  She became more euvolemic and her oxygen  demand was weaned down to 2L [place] on 08/17/24.  Her abx were continued and she finished 7 days for PNA.   Assessment/Plan: Acute respiratory failure with hypoxia and hypercarbia -Secondary to pneumonia and  fluid overload - Currently on HFNC 20L@0 .70>>HFNC at 11L>>HFNC 9L>>2L - Wean oxygen  as tolerated - 1/8--personally reviewed CXR--bilateral pleural effusions, patchy RUL opacity, vascular congestion - 08/19/24--now on RA   Severe Sepsis -due to PNA -presented with tachycardia, tachypnea, resp failure - Lactic 2.0>>1.2 - initially fluid resuscitated - continue IV abx - sepsis physiology now resolved   Aspiration pneumonia/lobar pneumonia - initially azithromycin >>IV doxy - Broadend antibiotic coverage to cefepime  -viral resp panel--neg -COVID/RSV/Flu--neg - speech therapy eval --dys 1>>dys 3 - MRSA neg   Acute HFpEF - continues to have signs of fluid overload - Personally reviewed chest x-ray--small bilateral pleural effusions, R-sided infiltrates -08/15/24 echo EF 50-55%, no WMA, G1DD, normal RVF, mild MR, mild-mod TR -continue IV lasix >>po lasix  on 08/20/24 - NEG 8L total   Opioid dependence - PDMP reviewed -norco 10/325, #42 filled 08/11/24 -norco 5/325, #90 filled 08/03/24 -tramadol  50 mg, #120 08/03/24 - Minimize opioids, but will not discontinue due to chronic usage - UDS positive for opiates and benzodiazepines - tylenol  and tramadol  for now   Elevated troponin -Secondary to demand ischemia - Troponin 23>> 26   Acute metabolic encephalopathy - Multifactorial including opioids, respiratory failure, infectious process - B12--3970 - Folic acid --9.7 -  TSH--1.470 - CT brain negative for acute findings - requiring precedex  for sedation initially>>weaned off 1/8 - 1/11--mental status at baseline   Hypokalemia - Repleted -mag 1.9   Goals of care - Currently DNR/DNI - 1/8 discussed with daughters 1/8>>not ready  for comfort, but agree with DNR/DNI - Appreciate palliative medicine--discussed with [name]         Family Communication:   daughters at bedside 1/9   Consultants:  palliative   Code Status:  DNR   DVT Prophylaxis:   [place] Lovenox      Procedures: As Listed in Progress Note Above   Antibiotics: Ceftriaxone  1/5>>1/7 Azithro 1/5>>1/6 Doxy 1/7>>1/11 -cefepime  1/5>>1/11          Subjective:  Patient denies fevers, chills, headache, chest pain, dyspnea, nausea, vomiting, diarrhea, abdominal pain, dysuria, hematuria, hematochezia, and melena.  Objective: Vitals:   08/18/24 0524 08/18/24 1417 08/18/24 2035 08/19/24 0532  BP: (!) 159/85 (!) 144/75 (!) 141/74 (!) 156/83  Pulse: 75 99 90 88  Resp: 18  20 20   Temp: 98.9 F (37.2 C) 98.3 F (36.8 C) 98.1 F (36.7 C) 98.7 F (37.1 C)  TempSrc:  Oral Oral Oral  SpO2: 90% 90% 91% 90%  Weight:      Height:        Intake/Output Summary (Last 24 hours) at 08/19/2024 1028 Last data filed at 08/19/2024 0550 Gross per 24 hour  Intake 480 ml  Output 1500 ml  Net -1020 ml   Weight change:  Exam:  General:  Pt is alert, follows commands appropriately, not in acute distress HEENT: No icterus, No thrush, No neck mass, [place]/AT Cardiovascular: RRR, S1/S2, no rubs, no gallops Respiratory: fine bibasilar crackles. No wheeze Abdomen: Soft/+BS, non tender, non distended, no guarding Extremities: No edema, No lymphangitis, No petechiae, No rashes, no synovitis   Data Reviewed: I have personally reviewed following labs and imaging studies Basic Metabolic Panel: Recent Labs  Lab 08/15/24 0431 08/16/24 0448 08/17/24 0532 08/18/24 0506 08/19/24 0411  NA 137 140 141 142 139  K 4.8 3.7 3.4* 3.3* 3.4*  CL 100 101 97* 96* 95*  CO2 31 33* 27 42* 40*  GLUCOSE 100* 121* 83 93 93  BUN 40* 32* 18 11 14   CREATININE 0.78 0.72 0.60 0.63 0.69  CALCIUM  8.6* 8.3* 8.8* 9.0 8.8*  MG  --  2.2 1.9 1.8 1.8  PHOS  --   --  2.7 2.5 3.5    Liver Function Tests: Recent Labs  Lab 08/13/24 1713 08/16/24 0448  AST 25 21  ALT 15 15  ALKPHOS 110 75  BILITOT 0.4 0.3  PROT 7.2 5.3*  ALBUMIN 4.3 3.4*   No results for input(s): LIPASE, AMYLASE in the last 168 hours. Recent Labs  Lab 08/14/24 1820  AMMONIA 34   Coagulation Profile: No results for input(s): INR, PROTIME in the last 168 hours. CBC: Recent Labs  Lab 08/13/24 1713 08/15/24 0431 08/16/24 0448 08/18/24 0506  WBC 11.2* 7.3 7.5 10.0  NEUTROABS 9.3* 5.9  --   --   HGB 13.5 12.2 12.4 14.8  HCT 43.8 37.6 38.2 45.2  MCV 108.4* 105.9* 104.4* 103.0*  PLT 288 207 229 284   Cardiac Enzymes: No results for input(s): CKTOTAL, CKMB, CKMBINDEX, TROPONINI in the last 168 hours. BNP: Invalid input(s): POCBNP CBG: Recent Labs  Lab 08/18/24 0522 08/18/24 1115 08/18/24 1632 08/18/24 2309 08/19/24 0533  GLUCAP 103* 121* 121* 127* 92   HbA1C: No results for input(s): HGBA1C in the  last 72 hours. Urine analysis:    Component Value Date/Time   COLORURINE YELLOW 12/20/2020 1154   APPEARANCEUR CLEAR 12/20/2020 1154   LABSPEC 1.016 12/20/2020 1154   PHURINE 5.0 12/20/2020 1154   GLUCOSEU NEGATIVE 12/20/2020 1154   HGBUR MODERATE (A) 12/20/2020 1154   BILIRUBINUR NEGATIVE 12/20/2020 1154   KETONESUR NEGATIVE 12/20/2020 1154   PROTEINUR NEGATIVE 12/20/2020 1154   NITRITE NEGATIVE 12/20/2020 1154   LEUKOCYTESUR NEGATIVE 12/20/2020 1154   Sepsis Labs: @LABRCNTIP (procalcitonin:4,lacticidven:4) ) Recent Results (from the past 240 hours)  Culture, blood (routine x 2)     Status: None   Collection Time: 08/13/24  5:03 PM   Specimen: BLOOD  Result Value Ref Range Status   Specimen Description BLOOD  Final   Special Requests NONE  Final   Culture   Final    NO GROWTH 5 DAYS Performed at [facility], [address] [place], [place] [address]    Report Status 08/18/2024 FINAL  Final  Resp panel by RT-PCR (RSV, Flu A&B, Covid) Anterior  Nasal Swab     Status: None   Collection Time: 08/13/24  5:18 PM   Specimen: Anterior Nasal Swab  Result Value Ref Range Status   SARS Coronavirus 2 by RT PCR NEGATIVE NEGATIVE Final    Comment: (NOTE) SARS-CoV-2 target nucleic acids are NOT DETECTED.  The SARS-CoV-2 RNA is generally detectable in upper respiratory specimens during the acute phase of infection. The lowest concentration of SARS-CoV-2 viral copies this assay can detect is 138 copies/mL. A negative result does not preclude SARS-Cov-2 infection and should not be used as the sole basis for treatment or other patient management decisions. A negative result may occur with  improper specimen collection/handling, submission of specimen other than nasopharyngeal swab, presence of viral mutation(s) within the areas targeted by this assay, and inadequate number of viral copies(<138 copies/mL). A negative result must be combined with clinical observations, patient history, and epidemiological information. The expected result is Negative.  Fact Sheet for Patients:  bloggercourse.com  Fact Sheet for Healthcare Providers:  seriousbroker.it  This test is no t yet approved or cleared by the [place]  FDA and  has been authorized for detection and/or diagnosis of SARS-CoV-2 by FDA under an Emergency Use Authorization (EUA). This EUA will remain  in effect (meaning this test can be used) for the duration of the COVID-19 declaration under Section 564(b)(1) of the Act, 21 U.S.C.section 360bbb-3(b)(1), unless the authorization is terminated  or revoked sooner.       Influenza A by PCR NEGATIVE NEGATIVE Final   Influenza B by PCR NEGATIVE NEGATIVE Final    Comment: (NOTE) The Xpert Xpress SARS-CoV-2/FLU/RSV plus assay is intended as an aid in the diagnosis of influenza from Nasopharyngeal swab specimens and should not be used as a sole basis for treatment. Nasal washings  and aspirates are unacceptable for Xpert Xpress SARS-CoV-2/FLU/RSV testing.  Fact Sheet for Patients: bloggercourse.com  Fact Sheet for Healthcare Providers: seriousbroker.it  This test is not yet approved or cleared by the [place]  FDA and has been authorized for detection and/or diagnosis of SARS-CoV-2 by FDA under an Emergency Use Authorization (EUA). This EUA will remain in effect (meaning this test can be used) for the duration of the COVID-19 declaration under Section 564(b)(1) of the Act, 21 U.S.C. section 360bbb-3(b)(1), unless the authorization is terminated or revoked.     Resp Syncytial Virus by PCR NEGATIVE NEGATIVE Final    Comment: (NOTE) Fact Sheet for Patients: bloggercourse.com  Fact Sheet for Healthcare Providers: seriousbroker.it  This test is not yet approved or cleared by the [place]  FDA and has been authorized for detection and/or diagnosis of SARS-CoV-2 by FDA under an Emergency Use Authorization (EUA). This EUA will remain in effect (meaning this test can be used) for the duration of the COVID-19 declaration under Section 564(b)(1) of the Act, 21 U.S.C. section 360bbb-3(b)(1), unless the authorization is terminated or revoked.  Performed at [facility], [address], [place], [place] [address]   Culture, blood (routine x 2)     Status: None   Collection Time: 08/13/24  5:30 PM   Specimen: BLOOD  Result Value Ref Range Status   Specimen Description BLOOD RIGHT ANTECUBITAL  Final   Special Requests   Final    BOTTLES DRAWN AEROBIC AND ANAEROBIC Blood Culture adequate volume   Culture   Final    NO GROWTH 5 DAYS Performed at [facility], [address] [place], [place] [address]    Report Status 08/18/2024 FINAL  Final  MRSA Next Gen by PCR, Nasal     Status: None   Collection Time: 08/13/24  8:55 PM   Specimen: Nasal Mucosa; Nasal  Swab  Result Value Ref Range Status   MRSA by PCR Next Gen NOT DETECTED NOT DETECTED Final    Comment: (NOTE) The GeneXpert MRSA Assay (FDA approved for NASAL specimens only), is one component of a comprehensive MRSA colonization surveillance program. It is not intended to diagnose MRSA infection nor to guide or monitor treatment for MRSA infections. Test performance is not FDA approved in patients less than 39 years old. Performed at [facility], [address], [place], [place] [address]   Respiratory (~20 pathogens) panel by PCR     Status: None   Collection Time: 08/15/24  5:25 PM   Specimen: Nasopharyngeal Swab; Respiratory  Result Value Ref Range Status   Adenovirus NOT DETECTED NOT DETECTED Final   Coronavirus 229E NOT DETECTED NOT DETECTED Final    Comment: (NOTE) The Coronavirus on the Respiratory Panel, DOES NOT test for the novel  Coronavirus (2019 nCoV)    Coronavirus HKU1 NOT DETECTED NOT DETECTED Final   Coronavirus NL63 NOT DETECTED NOT DETECTED Final   Coronavirus OC43 NOT DETECTED NOT DETECTED Final   Metapneumovirus NOT DETECTED NOT DETECTED Final   Rhinovirus / Enterovirus NOT DETECTED NOT DETECTED Final   Influenza A NOT DETECTED NOT DETECTED Final   Influenza B NOT DETECTED NOT DETECTED Final   Parainfluenza Virus 1 NOT DETECTED NOT DETECTED Final   Parainfluenza Virus 2 NOT DETECTED NOT DETECTED Final   Parainfluenza Virus 3 NOT DETECTED NOT DETECTED Final   Parainfluenza Virus 4 NOT DETECTED NOT DETECTED Final   Respiratory Syncytial Virus NOT DETECTED NOT DETECTED Final   Bordetella pertussis NOT DETECTED NOT DETECTED Final   Bordetella Parapertussis NOT DETECTED NOT DETECTED Final   Chlamydophila pneumoniae NOT DETECTED NOT DETECTED Final   Mycoplasma pneumoniae NOT DETECTED NOT DETECTED Final    Comment: Performed at [facility], [address] [place], [place] [address]     Scheduled Meds:  acetaminophen  (TYLENOL ) oral liquid 160 mg/5 mL   1,000 mg Oral Q8H   amLODipine   2.5 mg Oral Daily   Chlorhexidine  Gluconate Cloth  6 each Topical Daily   doxycycline   100 mg Oral Q12H   enoxaparin  (LOVENOX ) injection  40 mg Subcutaneous QHS   furosemide   40 mg Intravenous Daily   lidocaine   2 patch  Transdermal Q24H   magnesium  oxide  400 mg Oral BID   Continuous Infusions:  ceFEPime  (MAXIPIME ) IV 2 g (08/19/24 0820)   dexmedetomidine  (PRECEDEX ) IV infusion Stopped (08/16/24 0918)    Procedures/Studies: DG CHEST PORT 1 VIEW Result Date: 08/16/2024 EXAM: 1 VIEW(S) XRAY OF THE CHEST 08/16/2024 04:20:00 AM COMPARISON: Portable chest 08/13/2024. CLINICAL HISTORY: 427266 Acute respiratory failure with hypoxia (HCC) 427266 Acute respiratory failure with hypoxia (HCC) 427266 FINDINGS: LUNGS AND PLEURA: Mild perihilar vascular congestion. No appreciable interstitial edema. Small to moderate pleural effusions. Patchy right upper lobe airspace disease. Hazy atelectasis or consolidation in the lower lung fields alongside the effusions. Left upper lung field is clear. Pleural effusions appear to have increased since the prior study. No pneumothorax. HEART AND MEDIASTINUM: Cardiomegaly. Stable mediastinum with widening and aortic tortuosity and atherosclerosis. BONES AND SOFT TISSUES: Advanced arthrosis both shoulders. No acute osseous abnormality. In all other respects no further changes. IMPRESSION: 1. Small to moderate pleural effusions, increased since the prior study. 2. Patchy right upper lobe airspace disease and hazy atelectasis or consolidation in the lower lung fields alongside the effusions. 3. Cardiomegaly and mild perihilar vascular congestion. Electronically signed by: Francis Quam MD 08/16/2024 05:47 AM EST RP Workstation: [id number]   ECHOCARDIOGRAM COMPLETE Result Date: 08/15/2024    ECHOCARDIOGRAM REPORT   Patient Name:   SONNI BARSE Date of Exam: 08/15/2024 Medical Rec #:  [id number]       Height:       69.0 in Accession #:    [id number]       Weight:       167.8 lb Date of Birth:  [birth year]        BSA:          1.917 m Patient Age:    88 years        BP:           113/74 mmHg Patient Gender: F               HR:           81 bpm. Exam Location:  [facility] Procedure: 2D Echo, Color Doppler and Cardiac Doppler (Both Spectral and Color            Flow Doppler were utilized during procedure). Indications:    CHF-Acute Diastolic I50.31  History:        Patient has prior history of Echocardiogram examinations, most                 recent 01/14/2021. Risk Factors:Diabetes and Hypertension.  Sonographer:    Sydnee` Wilson RDCS Referring Phys: [id number] CAROLE N HALL IMPRESSIONS  1. Left ventricular ejection fraction, by estimation, is 50 to 55%. The left ventricle has low normal function. The left ventricle has no regional wall motion abnormalities. There is mild asymmetric left ventricular hypertrophy of the basal segment. Left ventricular diastolic parameters are consistent with Grade I diastolic dysfunction (impaired relaxation).  2. Right ventricular systolic function is normal. The right ventricular size is normal. There is moderately elevated pulmonary artery systolic pressure. The estimated right ventricular systolic pressure is 53.2 mmHg.  3. The mitral valve is grossly normal. Mild mitral valve regurgitation.  4. Tricuspid valve regurgitation is mild to moderate.  5. The aortic valve was not well visualized. There is mild calcification of the aortic valve. Aortic valve regurgitation is not visualized. Aortic valve mean gradient measures 4.0 mmHg.  6. The inferior vena cava is dilated in size with >50%  respiratory variability, suggesting right atrial pressure of 8 mmHg. Comparison(s): LVEF 50-55% with grade 1 diastolic dysfunction. Moderately elevated RVSP 53 mmHg. Mild mitral regurgitation. Mild to moderate tricuspid regurgitation. FINDINGS  Left Ventricle: Left ventricular ejection fraction, by estimation, is 50 to 55%. The left ventricle has low normal  function. The left ventricle has no regional wall motion abnormalities. The left ventricular internal cavity size was normal in size. There is mild asymmetric left ventricular hypertrophy of the basal segment. Left ventricular diastolic parameters are consistent with Grade I diastolic dysfunction (impaired relaxation). Right Ventricle: The right ventricular size is normal. No increase in right ventricular wall thickness. Right ventricular systolic function is normal. There is moderately elevated pulmonary artery systolic pressure. The tricuspid regurgitant velocity is 3.36 m/s, and with an assumed right atrial pressure of 8 mmHg, the estimated right ventricular systolic pressure is 53.2 mmHg. Left Atrium: Left atrial size was normal in size. Right Atrium: Right atrial size was normal in size. Pericardium: There is no evidence of pericardial effusion. Mitral Valve: The mitral valve is grossly normal. Mild mitral valve regurgitation. Tricuspid Valve: The tricuspid valve is grossly normal. Tricuspid valve regurgitation is mild to moderate. Aortic Valve: The aortic valve was not well visualized. There is mild calcification of the aortic valve. Aortic valve regurgitation is not visualized. Aortic valve mean gradient measures 4.0 mmHg. Aortic valve peak gradient measures 7.0 mmHg. Aortic valve area, by VTI measures 2.54 cm. Pulmonic Valve: The pulmonic valve was not well visualized. Pulmonic valve regurgitation is trivial. Aorta: The aortic root and ascending aorta are structurally normal, with no evidence of dilitation. Venous: The inferior vena cava is dilated in size with greater than 50% respiratory variability, suggesting right atrial pressure of 8 mmHg. IAS/Shunts: No atrial level shunt detected by color flow Doppler. Additional Comments: 3D was performed not requiring image post processing on an independent workstation and was indeterminate.  LEFT VENTRICLE PLAX 2D LVIDd:         3.80 cm     Diastology LVIDs:          3.20 cm     LV e' medial:    5.00 cm/s LV PW:         1.00 cm     LV E/e' medial:  15.6 LV IVS:        1.30 cm     LV e' lateral:   8.16 cm/s LVOT diam:     2.00 cm     LV E/e' lateral: 9.6 LV SV:         81 LV SV Index:   42 LVOT Area:     3.14 cm  LV Volumes (MOD) LV vol d, MOD A2C: 70.7 ml LV vol d, MOD A4C: 81.5 ml LV vol s, MOD A2C: 22.4 ml LV vol s, MOD A4C: 33.5 ml LV SV MOD A2C:     48.3 ml LV SV MOD A4C:     81.5 ml LV SV MOD BP:      48.9 ml RIGHT VENTRICLE RV S prime:     12.40 cm/s TAPSE (M-mode): 1.6 cm LEFT ATRIUM             Index        RIGHT ATRIUM           Index LA diam:        3.10 cm 1.62 cm/m   RA Area:     12.90 cm LA Vol (A2C):   37.0 ml 19.30 ml/m  RA Volume:   30.20 ml  15.75 ml/m LA Vol (A4C):   38.8 ml 20.24 ml/m LA Biplane Vol: 39.3 ml 20.50 ml/m  AORTIC VALVE AV Area (Vmax):    2.78 cm AV Area (Vmean):   2.49 cm AV Area (VTI):     2.54 cm AV Vmax:           132.00 cm/s AV Vmean:          96.200 cm/s AV VTI:            0.318 m AV Peak Grad:      7.0 mmHg AV Mean Grad:      4.0 mmHg LVOT Vmax:         117.00 cm/s LVOT Vmean:        76.100 cm/s LVOT VTI:          0.257 m LVOT/AV VTI ratio: 0.81  AORTA Ao Root diam: 2.70 cm Ao Asc diam:  3.00 cm MITRAL VALVE                TRICUSPID VALVE MV Area (PHT): 2.62 cm     TR Peak grad:   45.2 mmHg MV Decel Time: 290 msec     TR Vmax:        336.00 cm/s MV E velocity: 78.10 cm/s MV A velocity: 122.00 cm/s  SHUNTS MV E/A ratio:  0.64         Systemic VTI:  0.26 m                             Systemic Diam: 2.00 cm Jayson Sierras MD Electronically signed by Jayson Sierras MD Signature Date/Time: 08/15/2024/1:15:00 PM    Final    CT HEAD WO CONTRAST ( ) Result Date: 08/14/2024 EXAM: CT HEAD WITHOUT CONTRAST 08/14/2024 03:19:06 PM TECHNIQUE: CT of the head was performed without the administration of intravenous contrast. Automated exposure control, iterative reconstruction, and/or weight based adjustment of the mA/kV was utilized to  reduce the radiation dose to as low as reasonably achievable. COMPARISON: CT head 01/13/2021 CLINICAL HISTORY: Mental status change, unknown cause FINDINGS: Moderately motion limited study. BRAIN AND VENTRICLES: No acute hemorrhage. No evidence of acute infarct. No hydrocephalus. No extra-axial collection. No mass effect or midline shift. ORBITS: No acute abnormality. SINUSES: No acute abnormality. SOFT TISSUES AND SKULL: No acute soft tissue abnormality. No skull fracture. IMPRESSION: 1. Moderately motion limited study without obvious acute abnormality. Electronically signed by: Gilmore Molt 08/14/2024 11:10 PM EST RP Workstation: [id number]   DG Chest Portable 1 View Result Date: 08/13/2024 EXAM: 1 VIEW(S) XRAY OF THE CHEST 08/13/2024 05:21:00 PM COMPARISON: Comparison with 09/20/2022. CLINICAL HISTORY: SOB. FINDINGS: LUNGS AND PLEURA: Shallow inspiration. Elevation of the left hemidiaphragm. Interval development of airspace infiltrates in the right upper lung and right lower lung most likely representing pneumonia. Follow up to resolution is recommended to exclude an underlying obstructing lesion. Small bilateral pleural effusions. Probable emphysematous changes in the lungs. No pneumothorax. HEART AND MEDIASTINUM: Cardiac enlargement. Mediastinal contours appear intact. Calcification of the aorta. BONES AND SOFT TISSUES: Degenerative changes in the spine and shoulders. IMPRESSION: 1. Interval development of airspace infiltrates in the right upper lung and right lower lung, most likely representing pneumonia. Follow-up to resolution is recommended to exclude an underlying obstructing lesion. 2. Small bilateral pleural effusions. 3. Cardiac enlargement. 4. Probable emphysematous changes in the lungs. Electronically signed by: Elsie Gravely MD 08/13/2024 06:24 PM EST RP  Workstation: [id number]    Alm Schneider, DO  [facility]  If 7PM-7AM, please contact night-coverage [email or web address] Password  [email or web address] 08/19/2024, 10:28 AM   LOS: 6 days   "

## 2024-08-19 NOTE — Discharge Summary (Incomplete)
 " Physician Discharge Summary   Patient: Deanna Salinas MRN: [id number] DOB: [birth year]  Admit date:     08/13/2024  Discharge date: 08/20/2024  Discharge Physician: Alm Marice Angelino   PCP: [name], MD   Recommendations at discharge:   Please follow up with primary care provider within 1-2 weeks  Please repeat BMP and mag in one week Wean patient off oxygen  for saturation >92%;  currently on 2L     Hospital Course: 89 y.o. female with medical history significant for chronic pain syndrome, chronic insomnia, essential hypertension, hyperlipidemia, type 2 diabetes,  hearing impairment, chronic HFpEF, who presents to the ER via EMS with respiratory distress after being found minimally responsive at home by family.     Reportedly, the patient may have accidentally overdosed with home hydrocodone .  Per her daughter at bedside, her home tramadol  was changed to hydrocodone  a week ago, because tramadol  was no longer effective in controlling her generalized pain.  She had her chronic pain pills refilled 2 days ago.     For the past 2 days PTA she has been more somnolent.  Had a mild cough.  No reports of fevers or chills.  EMS was activated.  Upon EMS arrival, the patient had O2 saturation in the 40s, per EMS.  She was initially placed on nonrebreather mask then switched to CPAP en route and brought into the ER for further evaluation.   In the ER, O2 saturation in the 70s, BiPAP was applied.  Chest x-ray revealed diffuse infiltrates bilaterally, right greater than left.  She received Rocephin  and IV azithromycin  for community-acquired pneumonia and possible aspiration pneumonia.  TR[facility], hospitalist service, was asked admit.   Of note, the patient did not receive Narcan and is currently more alert.   ED Course: Temperature 96.2.  BP 114/64, pulse 79, respiratory 21, saturation 100% on room BiPAP with FiO2 of 80%.  The patient was transitioned to [facility] at 20L @ 70% FiO2.  She was started on IV abx.   Palliative medicine was consulted to discuss GOC with family.  The patient was initially very agitated requiring Precedex  infusion.  Gradually, the Precedex  has been weaned as the patient's agitation improved. She was noted to be fluid overloaded.  IV fluids were stopped and the patient was started on IV furosemide .  She became more euvolemic and her oxygen  demand was weaned down to 2L [place] on 08/17/24.  Her abx were continued and she finished 7 days for PNA.  Assessment and Plan: Acute respiratory failure with hypoxia and hypercarbia -Secondary to pneumonia and  fluid overload - Currently on HFNC 20L@0 .70>>HFNC at 11L>>HFNC 9L>>2L - Wean oxygen  as tolerated - 1/8--personally reviewed CXR--bilateral pleural effusions, patchy RUL opacity, vascular congestion - 08/19/24--now on 2L   Severe Sepsis -due to PNA -presented with tachycardia, tachypnea, resp failure - Lactic 2.0>>1.2 - initially fluid resuscitated - continue IV abx--finished 7 days - sepsis physiology now resolved   Aspiration pneumonia/lobar pneumonia - initially azithromycin >> doxy - Broadend antibiotic coverage to cefepime  -viral resp panel--neg -COVID/RSV/Flu--neg - speech therapy eval --dys 1>>dys 3>>regular diet - MRSA neg - finished 7 days abx during hospitalization   Acute HFpEF - continues to have signs of fluid overload - Personally reviewed chest x-ray--small bilateral pleural effusions, R-sided infiltrates -08/15/24 echo EF 50-55%, no WMA, G1DD, normal RVF, mild MR, mild-mod TR -continue IV lasix >>po lasix  on 08/20/24 - NEG 8L total   Opioid dependence - PDMP reviewed -norco 10/325, #42 filled 08/11/24 -norco 5/325, #90  filled 08/03/24 -tramadol  50 mg, #120 08/03/24 - Minimize opioids, but will not discontinue due to chronic usage - UDS positive for opiates and benzodiazepines - tylenol  and tramadol  for now>>tolerating   Elevated troponin -Secondary to demand ischemia - Troponin 23>> 26   Acute metabolic  encephalopathy - Multifactorial including opioids, respiratory failure, infectious process - B12--3970 - Folic acid --9.7 - TSH--1.470 - CT brain negative for acute findings - requiring precedex  for sedation initially>>weaned off 1/8 - 1/11--mental status at baseline   Hypokalemia - Repleted -mag 1.9 - d/c with mag ox 400 mg daily   Goals of care - Currently DNR/DNI - 1/8 discussed with daughters 1/8>>not ready for comfort, but agree with DNR/DNI - Appreciate palliative medicine--discussed with [name]        Consultants: palliative Procedures performed: none  Disposition: Home Diet recommendation:  Regular diet DISCHARGE MEDICATION: Allergies as of 08/20/2024       Reactions   Lyrica  [pregabalin ]    Itching and insomnia        Medication List     STOP taking these medications    Accu-Chek Guide test strip Generic drug: glucose blood   Accu-Chek Softclix Lancets lancets   HYDROcodone -acetaminophen  10-325 MG tablet Commonly known as: NORCO       TAKE these medications    acetaminophen  500 MG tablet Commonly known as: TYLENOL  Take 2 tablets (1,000 mg total) by mouth every 8 (eight) hours.   amLODipine  2.5 MG tablet Commonly known as: NORVASC  Take 1 tablet (2.5 mg total) by mouth daily. Start taking on: August 21, 2024 What changed:  medication strength how much to take   furosemide  40 MG tablet Commonly known as: LASIX  Take 1 tablet (40 mg total) by mouth daily. Start taking on: August 21, 2024   lidocaine  5 % Commonly known as: LIDODERM  Place 2 patches onto the skin daily. Remove & Discard patch within 12 hours or as directed by MD Place one patch on each knee   magnesium  oxide 400 (240 Mg) MG tablet Commonly known as: MAG-OX Take 1 tablet (400 mg total) by mouth daily. Start taking on: August 21, 2024   Melatonin 10 MG Caps Take 2 capsules by mouth at bedtime.   traMADol  50 MG tablet Commonly known as: ULTRAM  Take 1 tablet  (50 mg total) by mouth every 6 (six) hours as needed for moderate pain (pain score 4-6).   traZODone  50 MG tablet Commonly known as: DESYREL  Take 50 mg by mouth at bedtime as needed.        Contact information for after-discharge care     Destination     [facility] .   Service: Skilled Nursing Contact information: [address] [place]  [address] [phone]                    Discharge Exam: Filed Weights   08/13/24 1715 08/13/24 2103 08/13/24 2110  Weight: 72.6 kg 76.1 kg 76.1 kg   HEENT:  [place]/AT, No thrush, no icterus CV:  RRR, no rub, no S3, no S4 Lung:  fine bibasilar crackles.  No wheeze Abd:  soft/+BS, NT Ext:  No edema, no lymphangitis, no synovitis, no rash   Condition at discharge: stable  The results of significant diagnostics from this hospitalization (including imaging, microbiology, ancillary and laboratory) are listed below for reference.   Imaging Studies: DG CHEST PORT 1 VIEW Result Date: 08/16/2024 EXAM: 1 VIEW(S) XRAY OF THE CHEST 08/16/2024 04:20:00 AM COMPARISON: Portable  chest 08/13/2024. CLINICAL HISTORY: 427266 Acute respiratory failure with hypoxia (HCC) 427266 Acute respiratory failure with hypoxia (HCC) 427266 FINDINGS: LUNGS AND PLEURA: Mild perihilar vascular congestion. No appreciable interstitial edema. Small to moderate pleural effusions. Patchy right upper lobe airspace disease. Hazy atelectasis or consolidation in the lower lung fields alongside the effusions. Left upper lung field is clear. Pleural effusions appear to have increased since the prior study. No pneumothorax. HEART AND MEDIASTINUM: Cardiomegaly. Stable mediastinum with widening and aortic tortuosity and atherosclerosis. BONES AND SOFT TISSUES: Advanced arthrosis both shoulders. No acute osseous abnormality. In all other respects no further changes. IMPRESSION: 1. Small to moderate pleural effusions, increased since the prior study. 2. Patchy  right upper lobe airspace disease and hazy atelectasis or consolidation in the lower lung fields alongside the effusions. 3. Cardiomegaly and mild perihilar vascular congestion. Electronically signed by: Francis Quam MD 08/16/2024 05:47 AM EST RP Workstation: [id number]   ECHOCARDIOGRAM COMPLETE Result Date: 08/15/2024    ECHOCARDIOGRAM REPORT   Patient Name:   Deanna Salinas Date of Exam: 08/15/2024 Medical Rec #:  [id number]       Height:       69.0 in Accession #:    [id number]      Weight:       167.8 lb Date of Birth:  31-May-1936        BSA:          1.917 m Patient Age:    88 years        BP:           113/74 mmHg Patient Gender: F               HR:           81 bpm. Exam Location:  [facility] Procedure: 2D Echo, Color Doppler and Cardiac Doppler (Both Spectral and Color            Flow Doppler were utilized during procedure). Indications:    CHF-Acute Diastolic I50.31  History:        Patient has prior history of Echocardiogram examinations, most                 recent 01/14/2021. Risk Factors:Diabetes and Hypertension.  Sonographer:    Sydnee` Wilson RDCS Referring Phys: [id number] CAROLE N HALL IMPRESSIONS  1. Left ventricular ejection fraction, by estimation, is 50 to 55%. The left ventricle has low normal function. The left ventricle has no regional wall motion abnormalities. There is mild asymmetric left ventricular hypertrophy of the basal segment. Left ventricular diastolic parameters are consistent with Grade I diastolic dysfunction (impaired relaxation).  2. Right ventricular systolic function is normal. The right ventricular size is normal. There is moderately elevated pulmonary artery systolic pressure. The estimated right ventricular systolic pressure is 53.2 mmHg.  3. The mitral valve is grossly normal. Mild mitral valve regurgitation.  4. Tricuspid valve regurgitation is mild to moderate.  5. The aortic valve was not well visualized. There is mild calcification of the aortic valve. Aortic valve  regurgitation is not visualized. Aortic valve mean gradient measures 4.0 mmHg.  6. The inferior vena cava is dilated in size with >50% respiratory variability, suggesting right atrial pressure of 8 mmHg. Comparison(s): LVEF 50-55% with grade 1 diastolic dysfunction. Moderately elevated RVSP 53 mmHg. Mild mitral regurgitation. Mild to moderate tricuspid regurgitation. FINDINGS  Left Ventricle: Left ventricular ejection fraction, by estimation, is 50 to 55%. The left ventricle has low normal function. The left ventricle has  no regional wall motion abnormalities. The left ventricular internal cavity size was normal in size. There is mild asymmetric left ventricular hypertrophy of the basal segment. Left ventricular diastolic parameters are consistent with Grade I diastolic dysfunction (impaired relaxation). Right Ventricle: The right ventricular size is normal. No increase in right ventricular wall thickness. Right ventricular systolic function is normal. There is moderately elevated pulmonary artery systolic pressure. The tricuspid regurgitant velocity is 3.36 m/s, and with an assumed right atrial pressure of 8 mmHg, the estimated right ventricular systolic pressure is 53.2 mmHg. Left Atrium: Left atrial size was normal in size. Right Atrium: Right atrial size was normal in size. Pericardium: There is no evidence of pericardial effusion. Mitral Valve: The mitral valve is grossly normal. Mild mitral valve regurgitation. Tricuspid Valve: The tricuspid valve is grossly normal. Tricuspid valve regurgitation is mild to moderate. Aortic Valve: The aortic valve was not well visualized. There is mild calcification of the aortic valve. Aortic valve regurgitation is not visualized. Aortic valve mean gradient measures 4.0 mmHg. Aortic valve peak gradient measures 7.0 mmHg. Aortic valve area, by VTI measures 2.54 cm. Pulmonic Valve: The pulmonic valve was not well visualized. Pulmonic valve regurgitation is trivial. Aorta: The  aortic root and ascending aorta are structurally normal, with no evidence of dilitation. Venous: The inferior vena cava is dilated in size with greater than 50% respiratory variability, suggesting right atrial pressure of 8 mmHg. IAS/Shunts: No atrial level shunt detected by color flow Doppler. Additional Comments: 3D was performed not requiring image post processing on an independent workstation and was indeterminate.  LEFT VENTRICLE PLAX 2D LVIDd:         3.80 cm     Diastology LVIDs:         3.20 cm     LV e' medial:    5.00 cm/s LV PW:         1.00 cm     LV E/e' medial:  15.6 LV IVS:        1.30 cm     LV e' lateral:   8.16 cm/s LVOT diam:     2.00 cm     LV E/e' lateral: 9.6 LV SV:         81 LV SV Index:   42 LVOT Area:     3.14 cm  LV Volumes (MOD) LV vol d, MOD A2C: 70.7 ml LV vol d, MOD A4C: 81.5 ml LV vol s, MOD A2C: 22.4 ml LV vol s, MOD A4C: 33.5 ml LV SV MOD A2C:     48.3 ml LV SV MOD A4C:     81.5 ml LV SV MOD BP:      48.9 ml RIGHT VENTRICLE RV S prime:     12.40 cm/s TAPSE (M-mode): 1.6 cm LEFT ATRIUM             Index        RIGHT ATRIUM           Index LA diam:        3.10 cm 1.62 cm/m   RA Area:     12.90 cm LA Vol (A2C):   37.0 ml 19.30 ml/m  RA Volume:   30.20 ml  15.75 ml/m LA Vol (A4C):   38.8 ml 20.24 ml/m LA Biplane Vol: 39.3 ml 20.50 ml/m  AORTIC VALVE AV Area (Vmax):    2.78 cm AV Area (Vmean):   2.49 cm AV Area (VTI):     2.54 cm AV Vmax:  132.00 cm/s AV Vmean:          96.200 cm/s AV VTI:            0.318 m AV Peak Grad:      7.0 mmHg AV Mean Grad:      4.0 mmHg LVOT Vmax:         117.00 cm/s LVOT Vmean:        76.100 cm/s LVOT VTI:          0.257 m LVOT/AV VTI ratio: 0.81  AORTA Ao Root diam: 2.70 cm Ao Asc diam:  3.00 cm MITRAL VALVE                TRICUSPID VALVE MV Area (PHT): 2.62 cm     TR Peak grad:   45.2 mmHg MV Decel Time: 290 msec     TR Vmax:        336.00 cm/s MV E velocity: 78.10 cm/s MV A velocity: 122.00 cm/s  SHUNTS MV E/A ratio:  0.64          Systemic VTI:  0.26 m                             Systemic Diam: 2.00 cm Jayson Sierras MD Electronically signed by Jayson Sierras MD Signature Date/Time: 08/15/2024/1:15:00 PM    Final    CT HEAD WO CONTRAST ( ) Result Date: 08/14/2024 EXAM: CT HEAD WITHOUT CONTRAST 08/14/2024 03:19:06 PM TECHNIQUE: CT of the head was performed without the administration of intravenous contrast. Automated exposure control, iterative reconstruction, and/or weight based adjustment of the mA/kV was utilized to reduce the radiation dose to as low as reasonably achievable. COMPARISON: CT head 01/13/2021 CLINICAL HISTORY: Mental status change, unknown cause FINDINGS: Moderately motion limited study. BRAIN AND VENTRICLES: No acute hemorrhage. No evidence of acute infarct. No hydrocephalus. No extra-axial collection. No mass effect or midline shift. ORBITS: No acute abnormality. SINUSES: No acute abnormality. SOFT TISSUES AND SKULL: No acute soft tissue abnormality. No skull fracture. IMPRESSION: 1. Moderately motion limited study without obvious acute abnormality. Electronically signed by: Gilmore Molt 08/14/2024 11:10 PM EST RP Workstation: [id number]   DG Chest Portable 1 View Result Date: 08/13/2024 EXAM: 1 VIEW(S) XRAY OF THE CHEST 08/13/2024 05:21:00 PM COMPARISON: Comparison with 09/20/2022. CLINICAL HISTORY: SOB. FINDINGS: LUNGS AND PLEURA: Shallow inspiration. Elevation of the left hemidiaphragm. Interval development of airspace infiltrates in the right upper lung and right lower lung most likely representing pneumonia. Follow up to resolution is recommended to exclude an underlying obstructing lesion. Small bilateral pleural effusions. Probable emphysematous changes in the lungs. No pneumothorax. HEART AND MEDIASTINUM: Cardiac enlargement. Mediastinal contours appear intact. Calcification of the aorta. BONES AND SOFT TISSUES: Degenerative changes in the spine and shoulders. IMPRESSION: 1. Interval development of airspace  infiltrates in the right upper lung and right lower lung, most likely representing pneumonia. Follow-up to resolution is recommended to exclude an underlying obstructing lesion. 2. Small bilateral pleural effusions. 3. Cardiac enlargement. 4. Probable emphysematous changes in the lungs. Electronically signed by: Elsie Gravely MD 08/13/2024 06:24 PM EST RP Workstation: [id number]    Microbiology: Results for orders placed or performed during the hospital encounter of 08/13/24  Culture, blood (routine x 2)     Status: None   Collection Time: 08/13/24  5:03 PM   Specimen: BLOOD  Result Value Ref Range Status   Specimen Description BLOOD  Final   Special Requests NONE  Final  Culture   Final    NO GROWTH 5 DAYS Performed at [facility], [address], [place], [place] [address]    Report Status 08/18/2024 FINAL  Final  Resp panel by RT-PCR (RSV, Flu A&B, Covid) Anterior Nasal Swab     Status: None   Collection Time: 08/13/24  5:18 PM   Specimen: Anterior Nasal Swab  Result Value Ref Range Status   SARS Coronavirus 2 by RT PCR NEGATIVE NEGATIVE Final    Comment: (NOTE) SARS-CoV-2 target nucleic acids are NOT DETECTED.  The SARS-CoV-2 RNA is generally detectable in upper respiratory specimens during the acute phase of infection. The lowest concentration of SARS-CoV-2 viral copies this assay can detect is 138 copies/mL. A negative result does not preclude SARS-Cov-2 infection and should not be used as the sole basis for treatment or other patient management decisions. A negative result may occur with  improper specimen collection/handling, submission of specimen other than nasopharyngeal swab, presence of viral mutation(s) within the areas targeted by this assay, and inadequate number of viral copies(<138 copies/mL). A negative result must be combined with clinical observations, patient history, and epidemiological information. The expected result is Negative.  Fact Sheet for  Patients:  bloggercourse.com  Fact Sheet for Healthcare Providers:  seriousbroker.it  This test is no t yet approved or cleared by the [place]  FDA and  has been authorized for detection and/or diagnosis of SARS-CoV-2 by FDA under an Emergency Use Authorization (EUA). This EUA will remain  in effect (meaning this test can be used) for the duration of the COVID-19 declaration under Section 564(b)(1) of the Act, 21 U.S.C.section 360bbb-3(b)(1), unless the authorization is terminated  or revoked sooner.       Influenza A by PCR NEGATIVE NEGATIVE Final   Influenza B by PCR NEGATIVE NEGATIVE Final    Comment: (NOTE) The Xpert Xpress SARS-CoV-2/FLU/RSV plus assay is intended as an aid in the diagnosis of influenza from Nasopharyngeal swab specimens and should not be used as a sole basis for treatment. Nasal washings and aspirates are unacceptable for Xpert Xpress SARS-CoV-2/FLU/RSV testing.  Fact Sheet for Patients: bloggercourse.com  Fact Sheet for Healthcare Providers: seriousbroker.it  This test is not yet approved or cleared by the [place]  FDA and has been authorized for detection and/or diagnosis of SARS-CoV-2 by FDA under an Emergency Use Authorization (EUA). This EUA will remain in effect (meaning this test can be used) for the duration of the COVID-19 declaration under Section 564(b)(1) of the Act, 21 U.S.C. section 360bbb-3(b)(1), unless the authorization is terminated or revoked.     Resp Syncytial Virus by PCR NEGATIVE NEGATIVE Final    Comment: (NOTE) Fact Sheet for Patients: bloggercourse.com  Fact Sheet for Healthcare Providers: seriousbroker.it  This test is not yet approved or cleared by the [place]  FDA and has been authorized for detection and/or diagnosis of SARS-CoV-2 by FDA under an Emergency  Use Authorization (EUA). This EUA will remain in effect (meaning this test can be used) for the duration of the COVID-19 declaration under Section 564(b)(1) of the Act, 21 U.S.C. section 360bbb-3(b)(1), unless the authorization is terminated or revoked.  Performed at [facility], [address], [place], [place] [address]   Culture, blood (routine x 2)     Status: None   Collection Time: 08/13/24  5:30 PM   Specimen: BLOOD  Result Value Ref Range Status   Specimen Description BLOOD RIGHT ANTECUBITAL  Final   Special Requests   Final    BOTTLES  DRAWN AEROBIC AND ANAEROBIC Blood Culture adequate volume   Culture   Final    NO GROWTH 5 DAYS Performed at [facility], [address], [place], [place] [address]    Report Status 08/18/2024 FINAL  Final  MRSA Next Gen by PCR, Nasal     Status: None   Collection Time: 08/13/24  8:55 PM   Specimen: Nasal Mucosa; Nasal Swab  Result Value Ref Range Status   MRSA by PCR Next Gen NOT DETECTED NOT DETECTED Final    Comment: (NOTE) The GeneXpert MRSA Assay (FDA approved for NASAL specimens only), is one component of a comprehensive MRSA colonization surveillance program. It is not intended to diagnose MRSA infection nor to guide or monitor treatment for MRSA infections. Test performance is not FDA approved in patients less than 87 years old. Performed at [facility], [address], [place], [place] [address]   Respiratory (~20 pathogens) panel by PCR     Status: None   Collection Time: 08/15/24  5:25 PM   Specimen: Nasopharyngeal Swab; Respiratory  Result Value Ref Range Status   Adenovirus NOT DETECTED NOT DETECTED Final   Coronavirus 229E NOT DETECTED NOT DETECTED Final    Comment: (NOTE) The Coronavirus on the Respiratory Panel, DOES NOT test for the novel  Coronavirus (2019 nCoV)    Coronavirus HKU1 NOT DETECTED NOT DETECTED Final   Coronavirus NL63 NOT DETECTED NOT DETECTED Final   Coronavirus OC43 NOT DETECTED NOT DETECTED Final    Metapneumovirus NOT DETECTED NOT DETECTED Final   Rhinovirus / Enterovirus NOT DETECTED NOT DETECTED Final   Influenza A NOT DETECTED NOT DETECTED Final   Influenza B NOT DETECTED NOT DETECTED Final   Parainfluenza Virus 1 NOT DETECTED NOT DETECTED Final   Parainfluenza Virus 2 NOT DETECTED NOT DETECTED Final   Parainfluenza Virus 3 NOT DETECTED NOT DETECTED Final   Parainfluenza Virus 4 NOT DETECTED NOT DETECTED Final   Respiratory Syncytial Virus NOT DETECTED NOT DETECTED Final   Bordetella pertussis NOT DETECTED NOT DETECTED Final   Bordetella Parapertussis NOT DETECTED NOT DETECTED Final   Chlamydophila pneumoniae NOT DETECTED NOT DETECTED Final   Mycoplasma pneumoniae NOT DETECTED NOT DETECTED Final    Comment: Performed at [facility], [address] [place], [place] [address]    Labs: CBC: Recent Labs  Lab 08/13/24 1713 08/15/24 0431 08/16/24 0448 08/18/24 0506  WBC 11.2* 7.3 7.5 10.0  NEUTROABS 9.3* 5.9  --   --   HGB 13.5 12.2 12.4 14.8  HCT 43.8 37.6 38.2 45.2  MCV 108.4* 105.9* 104.4* 103.0*  PLT 288 207 229 284   Basic Metabolic Panel: Recent Labs  Lab 08/16/24 0448 08/17/24 0532 08/18/24 0506 08/19/24 0411 08/20/24 0418  NA 140 141 142 139 140  K 3.7 3.4* 3.3* 3.4* 3.9  CL 101 97* 96* 95* 96*  CO2 33* 27 42* 40* 42*  GLUCOSE 121* 83 93 93 94  BUN 32* 18 11 14 16   CREATININE 0.72 0.60 0.63 0.69 0.82  CALCIUM  8.3* 8.8* 9.0 8.8* 9.0  MG 2.2 1.9 1.8 1.8 2.3  PHOS  --  2.7 2.5 3.5 3.7   Liver Function Tests: Recent Labs  Lab 08/13/24 1713 08/16/24 0448  AST 25 21  ALT 15 15  ALKPHOS 110 75  BILITOT 0.4 0.3  PROT 7.2 5.3*  ALBUMIN 4.3 3.4*   CBG: Recent Labs  Lab 08/18/24 2309 08/19/24 0533 08/19/24 1119 08/19/24 2023 08/20/24 0539  GLUCAP 127* 92 127*  139* 103*    Discharge time spent: greater than 30 minutes.  Signed: Alm Schneider, MD [facility] 08/20/2024 "

## 2024-08-19 NOTE — Plan of Care (Signed)

## 2024-08-19 NOTE — Progress Notes (Signed)
 Speech Language Pathology Treatment: Dysphagia  Patient Details Name: Deanna Salinas MRN: [id number] DOB: 28-Mar-1936 Today's Date: 08/19/2024 Time: 8898-8877 SLP Time Calculation (min) (ACUTE ONLY): 21 min  Assessment / Plan / Recommendation Clinical Impression  Pt seen for ongoing dysphagia intervention. [name] present for visit. Pt with improved alertness and cognition. She self presented regular textures (peanut butter crackers) and thin water  without  overt signs of aspiration and no significant residuals. Recommend advance to regular textures and thin liquids via cup/straw with standard aspiration and reflux precautions.    HPI HPI: 89 y.o. female with medical history significant for chronic pain syndrome, chronic insomnia, essential hypertension, hyperlipidemia, type 2 diabetes, prediabetes, hearing impairment, chronic HFpEF, who presented to the ER via EMS with respiratory distress on 08/13/24 after being found minimally responsive at home by family. Reportedly, the patient may have accidentally overdosed with home hydrocodone . In the ER, O2 saturation in the 70s, BiPAP was applied. Chest x-ray revealed diffuse infiltrates bilaterally, right greater than left. She received Rocephin  and IV azithromycin  for community-acquired pneumonia and possible aspiration pneumonia. BSE requested.  Pt placed on Dysphagia 1/thin liquid diet post BSE.  ST f/u for diet progression/dysphagia tx/education.      SLP Plan  Continue with current plan of care        Swallow Evaluation Recommendations   Recommendations: PO diet PO Diet Recommendation: Regular;Thin liquids (Level 0) Liquid Administration via: Cup;Straw Medication Administration: Whole meds with liquid Supervision: Patient able to self-feed Postural changes: Position pt fully upright for meals;Stay upright 30-60 min after meals Oral care recommendations: Oral care BID (2x/day)     Recommendations                     Oral care  BID;Staff/trained caregiver to provide oral care   Intermittent Supervision/Assistance Dysphagia, unspecified (R13.10)     Continue with current plan of care    Thank you,  Lamar Candy, CCC-SLP [phone]  Karry Causer  08/19/2024, 11:22 AM

## 2024-08-20 DIAGNOSIS — J9602 Acute respiratory failure with hypercapnia: Secondary | ICD-10-CM | POA: Diagnosis not present

## 2024-08-20 DIAGNOSIS — J9601 Acute respiratory failure with hypoxia: Secondary | ICD-10-CM | POA: Diagnosis not present

## 2024-08-20 DIAGNOSIS — J181 Lobar pneumonia, unspecified organism: Secondary | ICD-10-CM | POA: Diagnosis not present

## 2024-08-20 DIAGNOSIS — A419 Sepsis, unspecified organism: Secondary | ICD-10-CM | POA: Diagnosis not present

## 2024-08-20 LAB — BASIC METABOLIC PANEL WITH GFR
Anion gap: 3 — ABNORMAL LOW (ref 5–15)
BUN: 16 mg/dL (ref 8–23)
CO2: 42 mmol/L — ABNORMAL HIGH (ref 22–32)
Calcium: 9 mg/dL (ref 8.9–10.3)
Chloride: 96 mmol/L — ABNORMAL LOW (ref 98–111)
Creatinine, Ser: 0.82 mg/dL (ref 0.44–1.00)
GFR, Estimated: >60 mL/min
Glucose, Bld: 94 mg/dL (ref 70–99)
Potassium: 3.9 mmol/L (ref 3.5–5.1)
Sodium: 140 mmol/L (ref 135–145)

## 2024-08-20 LAB — PHOSPHORUS: Phosphorus: 3.7 mg/dL (ref 2.5–4.6)

## 2024-08-20 LAB — MAGNESIUM: Magnesium: 2.3 mg/dL (ref 1.7–2.4)

## 2024-08-20 LAB — GLUCOSE, CAPILLARY: Glucose-Capillary: 103 mg/dL — ABNORMAL HIGH (ref 70–99)

## 2024-08-20 MED ORDER — FUROSEMIDE 40 MG PO TABS: 40.0000 mg | ORAL_TABLET | Freq: Every day | ORAL | Status: DC

## 2024-08-20 MED ORDER — MAGNESIUM OXIDE -MG SUPPLEMENT 400 (240 MG) MG PO TABS: 400.0000 mg | ORAL_TABLET | Freq: Every day | ORAL | Status: DC

## 2024-08-20 MED ORDER — ACETAMINOPHEN 500 MG PO TABS: 1000.0000 mg | ORAL_TABLET | Freq: Three times a day (TID) | ORAL | Status: AC

## 2024-08-20 MED ORDER — AMLODIPINE BESYLATE 2.5 MG PO TABS: 2.5000 mg | ORAL_TABLET | Freq: Every day | ORAL | Status: DC

## 2024-08-20 NOTE — TOC Transition Note (Signed)
 Transition of Care [facility]) - Discharge Note   Patient Details  Name: Deanna Salinas MRN: [id number] Date of Birth: 15-Jan-1936  Transition of Care [facility]) CM/SW Contact:  Mcarthur Saddie Kim, LCSW Phone Number: 08/20/2024, 10:51 AM   Clinical Narrative: Pt d/c today to [facility]. Pt, husband, and facility aware and agreeable. D/C summary sent to SNF. RN given number to call report. Will transfer with staff.       Final next level of care: Skilled Nursing Facility Barriers to Discharge: Barriers Resolved   Patient Goals and CMS Choice Patient states their goals for this hospitalization and ongoing recovery are:: get better CMS Medicare.gov Compare Post Acute Care list provided to:: Patient Represenative (must comment) Choice offered to / list presented to : Adult Children      Discharge Placement PASRR number recieved: 08/20/24            Patient chooses bed at: [facility] Patient to be transferred to facility by: staff Name of family member notified: husband Patient and family notified of of transfer: 08/20/24  Discharge Plan and Services Additional resources added to the After Visit Summary for   In-house Referral: Clinical Social Work Discharge Planning Services: CM Consult                                 Social Drivers of Health (SDOH) Interventions SDOH Screenings   Food Insecurity: No Food Insecurity (08/13/2024)  Housing: Low Risk (08/13/2024)  Transportation Needs: No Transportation Needs (08/13/2024)  Utilities: Not At Risk (08/13/2024)  Alcohol Screen: Low Risk (05/20/2022)  Depression (PHQ2-9): High Risk (07/16/2024)  Financial Resource Strain: Low Risk (10/23/2023)   Received from [facility] [place]   Physical Activity: Inactive (10/23/2023)   Received from [facility] [place]   Social Connections: Moderately Isolated (08/13/2024)  Stress: Stress Concern Present (10/23/2023)   Received from [facility]  [place]   Tobacco Use: Low Risk (08/13/2024)     Readmission Risk Interventions    08/17/2024    8:39 AM  Readmission Risk Prevention Plan  Transportation Screening Complete  HRI or Home Care Consult Complete  Social Work Consult for Recovery Care Planning/Counseling Complete  Palliative Care Screening Complete  Medication Review Oceanographer) Complete

## 2024-08-20 NOTE — Progress Notes (Signed)
 Mobility Specialist Progress Note:    08/20/24 1020  Mobility  Activity Ambulated with assistance  Level of Assistance Moderate assist, patient does 50-74%  Assistive Device Front wheel walker  Distance Ambulated (ft) 10 ft  Range of Motion/Exercises Active;All extremities  Activity Response Tolerated well  Mobility Referral Yes  Mobility visit 1 Mobility  Mobility Specialist Start Time (ACUTE ONLY) 1020  Mobility Specialist Stop Time (ACUTE ONLY) 1040  Mobility Specialist Time Calculation (min) (ACUTE ONLY) 20 min   Pt received in bed, agreeable to mobility. Required MinA for supine to sit, ModA for sit to stand, and CGA to ambulate with RW. Tolerated well, c/o right hip pain. Left sitting EOB, NT and husband in room. All needs met.  Annick Dimaio Mobility Specialist Please contact via Special Educational Needs Teacher or  Rehab office at [phone]

## 2024-08-20 NOTE — Plan of Care (Signed)
 " Problem: Education: Goal: Knowledge of General Education information will improve Description: Including pain rating scale, medication(s)/side effects and non-pharmacologic comfort measures 08/20/2024 1038 by Mathews Norleen POUR, RN Outcome: Adequate for Discharge 08/20/2024 0835 by Mathews Norleen POUR, RN Outcome: Progressing   Problem: Health Behavior/Discharge Planning: Goal: Ability to manage health-related needs will improve 08/20/2024 1038 by Mathews Norleen POUR, RN Outcome: Adequate for Discharge 08/20/2024 0835 by Mathews Norleen POUR, RN Outcome: Progressing   Problem: Clinical Measurements: Goal: Ability to maintain clinical measurements within normal limits will improve 08/20/2024 1038 by Mathews Norleen POUR, RN Outcome: Adequate for Discharge 08/20/2024 0835 by Mathews Norleen POUR, RN Outcome: Progressing Goal: Will remain free from infection 08/20/2024 1038 by Mathews Norleen POUR, RN Outcome: Adequate for Discharge 08/20/2024 0835 by Mathews Norleen POUR, RN Outcome: Progressing Goal: Diagnostic test results will improve 08/20/2024 1038 by Mathews Norleen POUR, RN Outcome: Adequate for Discharge 08/20/2024 0835 by Mathews Norleen POUR, RN Outcome: Progressing Goal: Respiratory complications will improve 08/20/2024 1038 by Mathews Norleen POUR, RN Outcome: Adequate for Discharge 08/20/2024 0835 by Mathews Norleen POUR, RN Outcome: Progressing Goal: Cardiovascular complication will be avoided 08/20/2024 1038 by Mathews Norleen POUR, RN Outcome: Adequate for Discharge 08/20/2024 0835 by Mathews Norleen POUR, RN Outcome: Progressing   Problem: Activity: Goal: Risk for activity intolerance will decrease 08/20/2024 1038 by Mathews Norleen POUR, RN Outcome: Adequate for Discharge 08/20/2024 0835 by Mathews Norleen POUR, RN Outcome: Progressing   Problem: Nutrition: Goal: Adequate nutrition will be maintained 08/20/2024 1038 by Mathews Norleen POUR, RN Outcome: Adequate for Discharge 08/20/2024 0835 by Mathews Norleen POUR, RN Outcome: Progressing   Problem: Coping: Goal: Level of anxiety  will decrease 08/20/2024 1038 by Mathews Norleen POUR, RN Outcome: Adequate for Discharge 08/20/2024 0835 by Mathews Norleen POUR, RN Outcome: Progressing   Problem: Elimination: Goal: Will not experience complications related to bowel motility 08/20/2024 1038 by Mathews Norleen POUR, RN Outcome: Adequate for Discharge 08/20/2024 0835 by Mathews Norleen POUR, RN Outcome: Progressing Goal: Will not experience complications related to urinary retention 08/20/2024 1038 by Mathews Norleen POUR, RN Outcome: Adequate for Discharge 08/20/2024 0835 by Mathews Norleen POUR, RN Outcome: Progressing   Problem: Pain Managment: Goal: General experience of comfort will improve and/or be controlled 08/20/2024 1038 by Mathews Norleen POUR, RN Outcome: Adequate for Discharge 08/20/2024 0835 by Mathews Norleen POUR, RN Outcome: Progressing   Problem: Safety: Goal: Ability to remain free from injury will improve 08/20/2024 1038 by Mathews Norleen POUR, RN Outcome: Adequate for Discharge 08/20/2024 0835 by Mathews Norleen POUR, RN Outcome: Progressing   Problem: Skin Integrity: Goal: Risk for impaired skin integrity will decrease 08/20/2024 1038 by Mathews Norleen POUR, RN Outcome: Adequate for Discharge 08/20/2024 0835 by Mathews Norleen POUR, RN Outcome: Progressing   Problem: Education: Goal: Ability to describe self-care measures that may prevent or decrease complications (Diabetes Survival Skills Education) will improve 08/20/2024 1038 by Mathews Norleen POUR, RN Outcome: Adequate for Discharge 08/20/2024 0835 by Mathews Norleen POUR, RN Outcome: Progressing Goal: Individualized Educational Video(s) 08/20/2024 1038 by Mathews Norleen POUR, RN Outcome: Adequate for Discharge 08/20/2024 0835 by Mathews Norleen POUR, RN Outcome: Progressing   Problem: Coping: Goal: Ability to adjust to condition or change in health will improve 08/20/2024 1038 by Mathews Norleen POUR, RN Outcome: Adequate for Discharge 08/20/2024 0835 by Mathews Norleen POUR, RN Outcome: Progressing   Problem: Fluid Volume: Goal: Ability to maintain  a balanced intake and output will improve 08/20/2024 1038 by Christyl Osentoski, Norleen POUR, RN Outcome: Adequate for Discharge  08/20/2024 0835 by Mathews Norleen POUR, RN Outcome: Progressing   Problem: Health Behavior/Discharge Planning: Goal: Ability to identify and utilize available resources and services will improve 08/20/2024 1038 by Mathews Norleen POUR, RN Outcome: Adequate for Discharge 08/20/2024 0835 by Mathews Norleen POUR, RN Outcome: Progressing Goal: Ability to manage health-related needs will improve 08/20/2024 1038 by Mathews Norleen POUR, RN Outcome: Adequate for Discharge 08/20/2024 0835 by Mathews Norleen POUR, RN Outcome: Progressing   Problem: Metabolic: Goal: Ability to maintain appropriate glucose levels will improve 08/20/2024 1038 by Mathews Norleen POUR, RN Outcome: Adequate for Discharge 08/20/2024 0835 by Mathews Norleen POUR, RN Outcome: Progressing   Problem: Nutritional: Goal: Maintenance of adequate nutrition will improve 08/20/2024 1038 by Mathews Norleen POUR, RN Outcome: Adequate for Discharge 08/20/2024 0835 by Mathews Norleen POUR, RN Outcome: Progressing Goal: Progress toward achieving an optimal weight will improve 08/20/2024 1038 by Mathews Norleen POUR, RN Outcome: Adequate for Discharge 08/20/2024 0835 by Mathews Norleen POUR, RN Outcome: Progressing   Problem: Skin Integrity: Goal: Risk for impaired skin integrity will decrease 08/20/2024 1038 by Mathews Norleen POUR, RN Outcome: Adequate for Discharge 08/20/2024 0835 by Mathews Norleen POUR, RN Outcome: Progressing   Problem: Tissue Perfusion: Goal: Adequacy of tissue perfusion will improve 08/20/2024 1038 by Mathews Norleen POUR, RN Outcome: Adequate for Discharge 08/20/2024 0835 by Mathews Norleen POUR, RN Outcome: Progressing   Problem: Safety: Goal: Non-violent Restraint(s) 08/20/2024 1038 by Mathews Norleen POUR, RN Outcome: Adequate for Discharge 08/20/2024 0835 by Mathews Norleen POUR, RN Outcome: Progressing   "

## 2024-08-20 NOTE — Plan of Care (Signed)
   Problem: Education: Goal: Knowledge of General Education information will improve Description: Including pain rating scale, medication(s)/side effects and non-pharmacologic comfort measures Outcome: Progressing   Problem: Activity: Goal: Risk for activity intolerance will decrease Outcome: Progressing   Problem: Nutrition: Goal: Adequate nutrition will be maintained Outcome: Progressing

## 2024-08-20 NOTE — Plan of Care (Signed)

## 2024-08-20 NOTE — Care Management Important Message (Signed)
 Important Message  Patient Details  Name: Deanna Salinas MRN: [id number] Date of Birth: 1935/08/12   Important Message Given:  Yes - Medicare IM     Deanna Salinas 08/20/2024, 10:23 AM

## 2024-08-21 ENCOUNTER — Encounter: Payer: Self-pay | Admitting: Adult Health

## 2024-08-21 ENCOUNTER — Non-Acute Institutional Stay (SKILLED_NURSING_FACILITY): Admitting: Adult Health

## 2024-08-21 DIAGNOSIS — J9602 Acute respiratory failure with hypercapnia: Secondary | ICD-10-CM

## 2024-08-21 DIAGNOSIS — M150 Primary generalized (osteo)arthritis: Secondary | ICD-10-CM | POA: Diagnosis not present

## 2024-08-21 DIAGNOSIS — F5104 Psychophysiologic insomnia: Secondary | ICD-10-CM

## 2024-08-21 DIAGNOSIS — A419 Sepsis, unspecified organism: Secondary | ICD-10-CM | POA: Diagnosis not present

## 2024-08-21 DIAGNOSIS — J9601 Acute respiratory failure with hypoxia: Secondary | ICD-10-CM | POA: Diagnosis not present

## 2024-08-21 DIAGNOSIS — J181 Lobar pneumonia, unspecified organism: Secondary | ICD-10-CM

## 2024-08-21 DIAGNOSIS — E1165 Type 2 diabetes mellitus with hyperglycemia: Secondary | ICD-10-CM | POA: Diagnosis not present

## 2024-08-21 DIAGNOSIS — F1120 Opioid dependence, uncomplicated: Secondary | ICD-10-CM

## 2024-08-21 DIAGNOSIS — I110 Hypertensive heart disease with heart failure: Secondary | ICD-10-CM | POA: Diagnosis not present

## 2024-08-21 DIAGNOSIS — I5031 Acute diastolic (congestive) heart failure: Secondary | ICD-10-CM

## 2024-08-21 DIAGNOSIS — E1169 Type 2 diabetes mellitus with other specified complication: Secondary | ICD-10-CM

## 2024-08-21 DIAGNOSIS — G894 Chronic pain syndrome: Secondary | ICD-10-CM

## 2024-08-21 NOTE — Progress Notes (Signed)
 " Location:  [facility] Room Number: 159 Place of Service:  SNF (31)   CODE STATUS: dnr   Allergies[1]  Chief Complaint  Patient presents with   Hospitalization Follow-up    HPI:  She is a 89 year old woman who has been hospitalized from 08-13-24 through 08-20-24. Her past medical history includes: chronic pain syndrome; insomnia; chronic HFpEF; hypertension; hyperlipidemia; type 2 diabetes mellitus. She tells me she went to the hospital after she had taken gin and benadryl  together to get to sleep. Her family states that she had been taking hydrocodone  for her pain management. She had been on tramadol  for her pain; which became ineffective and she was started on hydrocodone  about one week prior to there hospitalization. She had been confused and delusional for several days prior to her hospitalization.  EMS was called; upon their arrival was found to 02 sat in the 40's. She was placed on nonrebreather then CPAP.  In the ED 02 sats were in the 70's and BiPAP was applied. She was found to diffuse infiltrates bilaterally with the right greater than left. She was started on rocephin  and IV azithromycin  for community acquired pneumonia and possible aspiration pneumonia. She became very agitated and required precedex  which was effective.   she was treated for acute respiratory failure with hypoxia and hypercarbia: her 02 was weaned to 2 liters; is presently on room air. She has completed her antibiotic therapy; her lactic acid max was 2.0.    Her main concern today is insomnia. Her spouse tells me that she can stay up to 48 hours without sleep. She will then collapse.  She is here for short term rehab with her goal to return back home. She will continue to be followed for her chronic illnesses including: Benign hypertension with coincident congestive heart failure:  Acute heart failure with preserved ejection fracture: HFpEF  Hyperlipidemia associated with type 2 diabetes  mellitus:  Type 2 diabetes mellitus with hyperglycemia without current long term use of insulin   Past Medical History:  Diagnosis Date   Deaf    Essential hypertension    Fatty liver    Hyperlipidemia    Type 2 diabetes mellitus (HCC)     Past Surgical History:  Procedure Laterality Date   ABDOMINAL HYSTERECTOMY     APPENDECTOMY     At the time of hysterectomy   CESAREAN SECTION     X5   COLONOSCOPY     COLONOSCOPY N/A 06/11/2013   Colonic diverticulosis, hyperplastic polyps. no further screening colonoscopies recommended   ESOPHAGOGASTRODUODENOSCOPY N/A 02/20/2014   Procedure: ESOPHAGOGASTRODUODENOSCOPY (EGD);  Surgeon: Lamar CHRISTELLA Hollingshead, MD;  Location: [facility];  Service: Endoscopy;  Laterality: N/A;  11:00    Social History   Socioeconomic History   Marital status: Married    Spouse name: Not on file   Number of children: 5   Years of education: Not on file   Highest education level: Not on file  Occupational History    Employer: RETIRED  Tobacco Use   Smoking status: Never    Passive exposure: Never   Smokeless tobacco: Never  Vaping Use   Vaping status: Never Used  Substance and Sexual Activity   Alcohol use: Yes    Alcohol/week: 3.0 standard drinks of alcohol    Types: 3 Glasses of wine per week    Comment: occasional   Drug use: No   Sexual activity: Not Currently  Other Topics Concern   Not on  file  Social History Narrative   Not on file   Social Drivers of Health   Tobacco Use: Low Risk (08/21/2024)   Patient History    Smoking Tobacco Use: Never    Smokeless Tobacco Use: Never    Passive Exposure: Never  Financial Resource Strain: Low Risk (10/23/2023)   Received from [facility] [place]    Overall Financial Resource Strain (CARDIA)    Difficulty of Paying Living Expenses: Not hard at all  Food Insecurity: No Food Insecurity (08/13/2024)   Epic    Worried About Radiation Protection Practitioner of Food in the Last Year: Never true    Ran Out of Food  in the Last Year: Never true  Transportation Needs: No Transportation Needs (08/13/2024)   Epic    Lack of Transportation (Medical): No    Lack of Transportation (Non-Medical): No  Physical Activity: Inactive (10/23/2023)   Received from [facility] [place]    Exercise Vital Sign    On average, how many days per week do you engage in moderate to strenuous exercise (like a brisk walk)?: 0 days    On average, how many minutes do you engage in exercise at this level?: 0 min  Stress: Stress Concern Present (10/23/2023)   Received from [facility] [place]    [employer] of Occupational Health - Occupational Stress Questionnaire    Feeling of Stress : To some extent  Social Connections: Moderately Isolated (08/13/2024)   Social Connection and Isolation Panel    Frequency of Communication with Friends and Family: More than three times a week    Frequency of Social Gatherings with Friends and Family: Never    Attends Religious Services: Never    Database Administrator or Organizations: No    Attends Banker Meetings: Never    Marital Status: Married  Catering Manager Violence: Patient Unable To Answer (08/13/2024)   Epic    Fear of Current or Ex-Partner: Patient unable to answer    Emotionally Abused: Patient unable to answer    Physically Abused: Patient unable to answer    Sexually Abused: Patient unable to answer  Depression (PHQ2-9): High Risk (07/16/2024)   Depression (PHQ2-9)    PHQ-2 Score: 18  Alcohol Screen: Low Risk (05/20/2022)   Alcohol Screen    Last Alcohol Screening Score (AUDIT): 4  Housing: Low Risk (08/13/2024)   Epic    Unable to Pay for Housing in the Last Year: No    Number of Times Moved in the Last Year: 0    Homeless in the Last Year: No  Utilities: Not At Risk (08/13/2024)   Epic    Threatened with loss of utilities: No  Health Literacy: Not on file   Family History  Problem Relation Age of Onset   Cancer Mother     Heart Problems Father    Congestive Heart Failure Father    Cancer Daughter        breast    Hypertension Other    Hypertension Other    Colon cancer Neg Hx       VITAL SIGNS BP (!) 142/83   Pulse 81   Temp 98.1 F (36.7 C)   Resp 20   Ht 5' 6 (1.676 m)   Wt 144 lb 12.8 oz (65.7 kg)   SpO2 94%   BMI 23.37 kg/m   Outpatient Encounter Medications as of 08/21/2024  Medication Sig   acetaminophen  (TYLENOL ) 500 MG tablet Take 2 tablets (1,000  mg total) by mouth every 8 (eight) hours.   amLODipine  (NORVASC ) 2.5 MG tablet Take 1 tablet (2.5 mg total) by mouth daily.   furosemide  (LASIX ) 40 MG tablet Take 1 tablet (40 mg total) by mouth daily.   lidocaine  (LIDODERM ) 5 % Place 2 patches onto the skin daily. Remove & Discard patch within 12 hours or as directed by MD Place one patch on each knee   magnesium  oxide (MAG-OX) 400 (240 Mg) MG tablet Take 1 tablet (400 mg total) by mouth daily.   Melatonin 10 MG CAPS Take 2 capsules by mouth at bedtime.   traMADol  (ULTRAM ) 50 MG tablet Take 1 tablet (50 mg total) by mouth every 6 (six) hours as needed for moderate pain (pain score 4-6).   traZODone  (DESYREL ) 50 MG tablet Take 50 mg by mouth at bedtime.   No facility-administered encounter medications on file as of 08/21/2024.     SIGNIFICANT DIAGNOSTIC EXAMS  LABS  07-16-24: hgb A1c 5.6; chol 188; ldl 83 trig 83 ldl 90 08-13-24: wbc 11.2; hgb 13.5; hct 43.8 mcv 108.4 plt 288; glucose 150; bun 40; creat 0.95; k+ 5.5; na++ 136; ca 9.3 gfr 58; protein 7.2 albumin 4.3; BNP 36690.0 08-14-24: glucose 117; bun 40; creat 1.15; k+ 5.0; na++ 138; ca 8.9; gfr 46 08-15-24: tsh 1.470 vitamin B12: 3970.0 08-20-24: glucose 94; bun 16; creat 0.82; k+ 3.9; na++ 140; ca 9.0 gfr >60   Review of Systems  Constitutional:  Negative for malaise/fatigue.  Respiratory:  Negative for cough and shortness of breath.   Cardiovascular:  Negative for chest pain, palpitations and leg swelling.  Gastrointestinal:   Negative for abdominal pain, constipation and heartburn.  Musculoskeletal:  Positive for joint pain. Negative for back pain and myalgias.       All joints: knees; hips; toes; shoulders  Skin: Negative.   Neurological:  Negative for dizziness.  Psychiatric/Behavioral:  The patient has insomnia. The patient is not nervous/anxious.      Physical Exam Constitutional:      General: She is not in acute distress.    Appearance: She is well-developed. She is not diaphoretic.  Neck:     Thyroid : No thyromegaly.  Cardiovascular:     Rate and Rhythm: Normal rate and regular rhythm.     Pulses: Normal pulses.     Heart sounds: Normal heart sounds.  Pulmonary:     Effort: Pulmonary effort is normal. No respiratory distress.     Breath sounds: Normal breath sounds.  Abdominal:     General: Bowel sounds are normal. There is no distension.     Palpations: Abdomen is soft.     Tenderness: There is no abdominal tenderness.  Musculoskeletal:        General: Normal range of motion.     Cervical back: Neck supple.     Right lower leg: No edema.     Left lower leg: No edema.  Lymphadenopathy:     Cervical: No cervical adenopathy.  Skin:    General: Skin is warm and dry.  Neurological:     Mental Status: She is alert. Mental status is at baseline.  Psychiatric:        Mood and Affect: Mood normal.       ASSESSMENT/ PLAN:  TODAY  Lobular pneumonia/sepsis due to unspecified organism/ acute respiratory failure with hypoxia and hypercapnia : her 02 needs have been weaned to 02 at 2 liters as needed. She has completed her antibiotics  2. Benign hypertension with  coincident congestive heart failure: b/p 142/83: will continue norvasc  2.5 mg daily will monitor her status   3. Acute heart failure with preserved ejection fracture: HFpEF: will continue lasix  40 mg daily   4. Hyperlipidemia associated with type 2 diabetes mellitus: ldl is 83 is not on statin  5. Type 2 diabetes mellitus with  hyperglycemia without current long term use of insulin : hgb A1c 5.6 will monitor   6. Primary osteoarthritis of multiple joints/chronic pain syndrome : uses lidoderm  5% patch to both knees daily; tylenol  1 gm three times daily; has tramadol  50 mg every 6 hours as needed   7. Chronic insomnia: states that she is not sleeping. Her family states that she can go 2 days without sleeping and will finally collapse. Will change her trazodone  50 mg nightly and will continue melatonin 10 mg nightly   8. Uncomplicated opioid dependence: she has been taken off her hydrocodone  and will continue tramadol  50 mg ever 6 hours as needed.      Barnie Seip NP [facility]  call [phone]     [1]  Allergies Allergen Reactions   Lyrica  [Pregabalin ]     Itching and insomnia   "

## 2024-08-22 ENCOUNTER — Non-Acute Institutional Stay (SKILLED_NURSING_FACILITY): Admitting: Internal Medicine

## 2024-08-22 ENCOUNTER — Encounter: Payer: Self-pay | Admitting: Internal Medicine

## 2024-08-22 DIAGNOSIS — J9602 Acute respiratory failure with hypercapnia: Secondary | ICD-10-CM | POA: Diagnosis not present

## 2024-08-22 DIAGNOSIS — J9601 Acute respiratory failure with hypoxia: Secondary | ICD-10-CM | POA: Diagnosis not present

## 2024-08-22 DIAGNOSIS — Z9189 Other specified personal risk factors, not elsewhere classified: Secondary | ICD-10-CM | POA: Insufficient documentation

## 2024-08-22 DIAGNOSIS — I5031 Acute diastolic (congestive) heart failure: Secondary | ICD-10-CM | POA: Diagnosis not present

## 2024-08-22 DIAGNOSIS — E038 Other specified hypothyroidism: Secondary | ICD-10-CM

## 2024-08-22 NOTE — Assessment & Plan Note (Signed)
 On exam rhythm is slightly irregular but, but rate adequately controlled.  TSH is current and therapeutic.  No change indicated.

## 2024-08-22 NOTE — Assessment & Plan Note (Signed)
 Currently she is on tramadol ; opiates have been discontinued.  Follow-up with the chronic pain clinic recommended postdischarge from the SNF.

## 2024-08-22 NOTE — Assessment & Plan Note (Signed)
CHF clinically compensated; no NVD or significant peripheral edema. No change in present cardiac regimen indicated.

## 2024-08-22 NOTE — Patient Instructions (Signed)
 See assessment and plan under each diagnosis in the problem list and acutely for this visit

## 2024-08-22 NOTE — Assessment & Plan Note (Addendum)
 She denies any cough or shortness of breath at this time.  No significant oscillatory findings on exam.

## 2024-08-22 NOTE — Progress Notes (Signed)
 "   NURSING HOME LOCATION:  [facility] ROOM NUMBER:  [id number]  CODE STATUS:  DNR  PCP:  [facility] LABOR. Luking MD  This is a comprehensive admission note to this SNFperformed on this date less than 30 days from date of admission. Included are preadmission medical/surgical history; reconciled medication list; family history; social history and comprehensive review of systems.  Corrections and additions to the records were documented. Comprehensive physical exam was also performed. Additionally a clinical summary was entered for each active diagnosis pertinent to this admission in the Problem List to enhance continuity of care.  HPI: She was hospitalized 1/5 - 08/20/2024 presenting to the ER via EMS with respiratory distress after being found minimally responsive at home by family.  Reportedly the patient may have accidentally overdosed on hydrocodone  prescribed for generalized musculoskeletal chronic pain. Her daughter informed the ER physician that her tramadol  was changed to hydrocodone  a week PTA because tramadol  was no longer effective in controlling her generalized pain.  Pain medication had been refilled 2 days PTA. During that 48-hour she was reported as being more somnolent.  She also was noted to have a cough.  Family called EMS who documented O2 sats in the 40s.  She was initially placed on nonrebreather mask then switched to CPAP en route to the ED. In the ED O2 sats were in the 70s; BiPAP was applied.  Chest x-ray revealed diffuse infiltrates in the right lung, cardiac enlargement, and bilateral effusions.  (Chest x-ray was personally reviewed).She received Rocephin  and IV azithromycin  for community-acquired pneumonia vs possible aspiration pneumonia. Lactic acid level was 2.0; potassium 5.5; BUN 40; and GFR 58.  Urine was positive for benzodiazepine and opiates. In the ED she apparently did not receive Narcan . O2 saturation was 100% on BiPAP with FiO2 of 80%.  Patient was  transitioned to [facility] at 20 L at 70% FiO2.  The patient was initially very agitated necessitating Precedex  infusion; this was weaned as agitation decreased. The AMS was felt to be multifactorial including opioid component as well as respiratory failure and acute infection.  B12 level was 3970; folic acid  9.7; and TSH 1.47.  CT head scan had been negative for any acute findings. Clinically fluid overload was present;Pro BNP was 3669.  IV fluids were stopped and IV furosemide  initiated.   EF was 50-55%.  Mild-moderate tricuspid regurgitation was present; grade 1 diastolic dysfunction was diagnosed. Troponin peaked at 26.  White blood count was 11,200; no left shift was present.  MCV was 108.4.   She was subsequently transitioned from IV furosemide  to oral formulation.   While hospitalized glucoses ranged from a low of 83 up to the high of 150.  A1c had been 5.6% on 07/16/2024. Subsequently she was able to be weaned to 2 L of nasal oxygen  as of 1/9. Speech therapy consulted; diet was advanced from dysphagia 1 to dysphagia 3 and finally to regular diet.  Full course of antibiotics for aspiration pneumonia was completed while an inpatient.  Clinically she stabilized enough to allow discharge to the SNF for rehab.  Past medical and surgical history: Includes essential hypertension; auditory deafness; hepatosteatosis; dyslipidemia; and diabetes with peripheral neuropathy. Surgeries and procedures include abdominal hysterectomy, colonoscopy, and EGD.  Family history: reviewed, non contributory due to advanced age.  She has 5 adult children, 13 grandchildren, and 5 great-grandchildren and one on the way.  Social history: Social alcohol use; non-smoker.   Review of systems: She validates that she had  pneumonia.  She states that the presentation was due to her taking Benadryl  and Gin as well as her tramadol  in an attempt to find sleep.  Insomnia is a chronic problem.  In reference to aspiration; she denies  frank dysphagia but states that she does have to clear her throat while eating @ times.  She denies any cough or dyspnea at this time.  She describes numbness in her feet.  She validates pain in all joints including shoulders, hands, hips, and knees. When asked about anxiety or depression her response was I gave up, just living because I am old.  Constitutional: No fever, significant weight change  Eyes: No redness, discharge, pain, vision change ENT/mouth: No nasal congestion, purulent discharge, earache, change in hearing, sore throat  Cardiovascular: No chest pain, palpitations, paroxysmal nocturnal dyspnea, claudication, edema  Respiratory: No cough, sputum production, hemoptysis, DOE, significant snoring, apnea  Gastrointestinal: No heartburn, abdominal pain, nausea /vomiting, rectal bleeding, melena, change in bowels Genitourinary: No dysuria, hematuria, pyuria, incontinence, nocturia Dermatologic: No rash, pruritus, change in appearance of skin Neurologic: No dizziness,  syncope, seizures Psychiatric: No significant anorexia Endocrine: No change in hair/skin/nails, excessive thirst, excessive hunger, excessive urination  Hematologic/lymphatic: No significant bruising, lymphadenopathy, abnormal bleeding Allergy/immunology: No itchy/watery eyes, significant sneezing, urticaria, angioedema  Physical exam:  Pertinent or positive findings: She is profoundly deaf.  Dentition is immaculate.  Heart rhythm is irregular; she has a grade 1 systolic murmur at the base.  Abdomen is protuberant.  Pedal pulses are decreased.  There is trace-1/2+ edema at the ankles.  Salonpas patches are present on the knees.  She has interosseous wasting of the hands.  Scattered bruising are noted over the forearms.  General appearance: Adequately nourished; no acute distress, increased work of breathing is present.   Lymphatic: No lymphadenopathy about the head, neck, axilla. Eyes: No conjunctival inflammation or  lid edema is present. There is no scleral icterus. Ears:  External ear exam shows no significant lesions or deformities.   Nose:  External nasal examination shows no deformity or inflammation. Nasal mucosa are pink and moist without lesions, exudates Oral exam: Lips and gums are healthy appearing.There is no oropharyngeal erythema or exudate. Neck:  No thyromegaly, masses, tenderness noted.    Heart:  No gallop, click, rub.  Lungs: without wheezes, rhonchi, rales, rubs. Abdomen: Bowel sounds are normal.  Abdomen is soft and nontender with no organomegaly, hernias, masses. GU: Deferred  Extremities:  No cyanosis, clubbing Neurologic exam:  Balance, Rhomberg, finger to nose testing could not be completed due to clinical state Skin: Warm & dry w/o tenting. No significant rash.  Acute respiratory failure with hypoxia and hypercarbia (HCC) She denies any cough or shortness of breath at this time.  No significant oscillatory findings on exam.  At risk for adverse drug reaction Currently she is on tramadol ; opiates have been discontinued.  Follow-up with the chronic pain clinic recommended postdischarge from the SNF.  Acute heart failure with preserved ejection fraction (HFpEF) (HCC) CHF clinically compensated; no NVD or significant peripheral edema. No change in present cardiac regimen indicated.   Subclinical hypothyroidism On exam rhythm is slightly irregular but, but rate adequately controlled.  TSH is current and therapeutic.  No change indicated.  See clinical summary under each active problem in the Problem List with associated updated therapeutic plan : "

## 2024-08-27 ENCOUNTER — Other Ambulatory Visit (HOSPITAL_COMMUNITY)
Admission: RE | Admit: 2024-08-27 | Discharge: 2024-08-27 | Disposition: A | Discharge status: Home / Self Care | Source: Skilled Nursing Facility | Attending: Adult Health | Admitting: Adult Health

## 2024-08-27 ENCOUNTER — Non-Acute Institutional Stay (SKILLED_NURSING_FACILITY): Payer: Self-pay | Admitting: Adult Health

## 2024-08-27 DIAGNOSIS — G894 Chronic pain syndrome: Secondary | ICD-10-CM | POA: Diagnosis not present

## 2024-08-27 DIAGNOSIS — I10 Essential (primary) hypertension: Secondary | ICD-10-CM | POA: Insufficient documentation

## 2024-08-27 DIAGNOSIS — K5909 Other constipation: Secondary | ICD-10-CM | POA: Diagnosis not present

## 2024-08-27 DIAGNOSIS — F5104 Psychophysiologic insomnia: Secondary | ICD-10-CM

## 2024-08-27 LAB — BASIC METABOLIC PANEL WITH GFR
Anion gap: 12 (ref 5–15)
BUN: 21 mg/dL (ref 8–23)
CO2: 28 mmol/L (ref 22–32)
Calcium: 8.9 mg/dL (ref 8.9–10.3)
Chloride: 103 mmol/L (ref 98–111)
Creatinine, Ser: 0.71 mg/dL (ref 0.44–1.00)
GFR, Estimated: >60 mL/min
Glucose, Bld: 91 mg/dL (ref 70–99)
Potassium: 3.7 mmol/L (ref 3.5–5.1)
Sodium: 143 mmol/L (ref 135–145)

## 2024-08-27 LAB — MAGNESIUM: Magnesium: 2.1 mg/dL (ref 1.7–2.4)

## 2024-08-27 NOTE — Progress Notes (Unsigned)
 " Location:  [facility] Room Number: 159 Place of Service:  SNF (31)   CODE STATUS: dnr   Allergies[1]  Chief Complaint  Patient presents with   Acute Visit    Patient concerns     HPI:  She has chronic pain in all of her joints due to arthritis. She is presently taking tylenol  1 gm three times daily; tramadol  50 mg every 6hours as needed and uses lidoderm  patches to her knees. She tells me that her pain is not well controlled. She hs chronic insomnia but feels that her pain is interfering with her ability to sleep. She has taken aleve  in the past with some relief of her pain. She is willing to try mobic  for her pain relief.   Past Medical History:  Diagnosis Date   Deaf    Essential hypertension    Fatty liver    Hyperlipidemia    Type 2 diabetes mellitus (HCC)     Past Surgical History:  Procedure Laterality Date   ABDOMINAL HYSTERECTOMY     APPENDECTOMY     At the time of hysterectomy   CESAREAN SECTION     X5   COLONOSCOPY     COLONOSCOPY N/A 06/11/2013   Colonic diverticulosis, hyperplastic polyps. no further screening colonoscopies recommended   ESOPHAGOGASTRODUODENOSCOPY N/A 02/20/2014   Procedure: ESOPHAGOGASTRODUODENOSCOPY (EGD);  Surgeon: Lamar CHRISTELLA Hollingshead, MD;  Location: [facility];  Service: Endoscopy;  Laterality: N/A;  11:00    Social History   Socioeconomic History   Marital status: Married    Spouse name: Not on file   Number of children: 5   Years of education: Not on file   Highest education level: Not on file  Occupational History    Employer: RETIRED  Tobacco Use   Smoking status: Never    Passive exposure: Never   Smokeless tobacco: Never  Vaping Use   Vaping status: Never Used  Substance and Sexual Activity   Alcohol use: Yes    Alcohol/week: 3.0 standard drinks of alcohol    Types: 3 Glasses of wine per week    Comment: occasional   Drug use: No   Sexual activity: Not Currently  Other Topics Concern   Not  on file  Social History Narrative   Not on file   Social Drivers of Health   Tobacco Use: Low Risk (08/22/2024)   Patient History    Smoking Tobacco Use: Never    Smokeless Tobacco Use: Never    Passive Exposure: Never  Financial Resource Strain: Low Risk (10/23/2023)   Received from [facility] [place]    Overall Financial Resource Strain (CARDIA)    Difficulty of Paying Living Expenses: Not hard at all  Food Insecurity: No Food Insecurity (08/13/2024)   Epic    Worried About Radiation Protection Practitioner of Food in the Last Year: Never true    Ran Out of Food in the Last Year: Never true  Transportation Needs: No Transportation Needs (08/13/2024)   Epic    Lack of Transportation (Medical): No    Lack of Transportation (Non-Medical): No  Physical Activity: Inactive (10/23/2023)   Received from [facility] [place]    Exercise Vital Sign    On average, how many days per week do you engage in moderate to strenuous exercise (like a brisk walk)?: 0 days    On average, how many minutes do you engage in exercise at this level?: 0 min  Stress: Stress Concern Present (10/23/2023)  Received from [facility] [place]    [employer] of Occupational Health - Occupational Stress Questionnaire    Feeling of Stress : To some extent  Social Connections: Moderately Isolated (08/13/2024)   Social Connection and Isolation Panel    Frequency of Communication with Friends and Family: More than three times a week    Frequency of Social Gatherings with Friends and Family: Never    Attends Religious Services: Never    Database Administrator or Organizations: No    Attends Banker Meetings: Never    Marital Status: Married  Catering Manager Violence: Patient Unable To Answer (08/13/2024)   Epic    Fear of Current or Ex-Partner: Patient unable to answer    Emotionally Abused: Patient unable to answer    Physically Abused: Patient unable to answer    Sexually Abused:  Patient unable to answer  Depression (PHQ2-9): High Risk (07/16/2024)   Depression (PHQ2-9)    PHQ-2 Score: 18  Alcohol Screen: Low Risk (05/20/2022)   Alcohol Screen    Last Alcohol Screening Score (AUDIT): 4  Housing: Low Risk (08/13/2024)   Epic    Unable to Pay for Housing in the Last Year: No    Number of Times Moved in the Last Year: 0    Homeless in the Last Year: No  Utilities: Not At Risk (08/13/2024)   Epic    Threatened with loss of utilities: No  Health Literacy: Not on file   Family History  Problem Relation Age of Onset   Cancer Mother    Heart Problems Father    Congestive Heart Failure Father    Cancer Daughter        breast    Hypertension Other    Hypertension Other    Colon cancer Neg Hx       VITAL SIGNS BP 124/72   Pulse 86   Temp 98 F (36.7 C)   Resp 20   Ht 5' 6 (1.676 m)   Wt 149 lb 9.6 oz (67.9 kg)   SpO2 97%   BMI 24.15 kg/m   Outpatient Encounter Medications as of 08/27/2024  Medication Sig   acetaminophen  (TYLENOL ) 500 MG tablet Take 2 tablets (1,000 mg total) by mouth every 8 (eight) hours.   amLODipine  (NORVASC ) 2.5 MG tablet Take 1 tablet (2.5 mg total) by mouth daily.   furosemide  (LASIX ) 40 MG tablet Take 1 tablet (40 mg total) by mouth daily.   lidocaine  (LIDODERM ) 5 % Place 2 patches onto the skin daily. Remove & Discard patch within 12 hours or as directed by MD Place one patch on each knee   magnesium  oxide (MAG-OX) 400 (240 Mg) MG tablet Take 1 tablet (400 mg total) by mouth daily.   Melatonin 10 MG CAPS Take 2 capsules by mouth at bedtime.   traMADol  (ULTRAM ) 50 MG tablet Take 1 tablet (50 mg total) by mouth every 6 (six) hours as needed for moderate pain (pain score 4-6).   traZODone  (DESYREL ) 50 MG tablet Take 50 mg by mouth at bedtime.   No facility-administered encounter medications on file as of 08/27/2024.     SIGNIFICANT DIAGNOSTIC EXAMS  LABS  07-16-24: hgb A1c 5.6; chol 188; ldl 83 trig 83 ldl 90 08-13-24: wbc  11.2; hgb 13.5; hct 43.8 mcv 108.4 plt 288; glucose 150; bun 40; creat 0.95; k+ 5.5; na++ 136; ca 9.3 gfr 58; protein 7.2 albumin 4.3; BNP 36690.0 08-14-24: glucose 117; bun 40; creat 1.15; k+  5.0; na++ 138; ca 8.9; gfr 46 08-15-24: tsh 1.470 vitamin B12: 3970.0 08-20-24: glucose 94; bun 16; creat 0.82; k+ 3.9; na++ 140; ca 9.0 gfr >60    Review of Systems  Constitutional:  Negative for malaise/fatigue.  Respiratory:  Negative for cough and shortness of breath.   Cardiovascular:  Negative for chest pain, palpitations and leg swelling.  Gastrointestinal:  Positive for constipation. Negative for abdominal pain and heartburn.  Musculoskeletal:  Positive for back pain and joint pain. Negative for myalgias.  Skin: Negative.   Neurological:  Negative for dizziness.  Psychiatric/Behavioral:  The patient has insomnia. The patient is not nervous/anxious.     Physical Exam Constitutional:      General: She is not in acute distress.    Appearance: She is well-developed. She is not diaphoretic.  Neck:     Thyroid : No thyromegaly.  Cardiovascular:     Rate and Rhythm: Normal rate and regular rhythm.     Heart sounds: Normal heart sounds.  Pulmonary:     Effort: Pulmonary effort is normal. No respiratory distress.     Breath sounds: Normal breath sounds.  Abdominal:     General: Bowel sounds are normal. There is no distension.     Palpations: Abdomen is soft.     Tenderness: There is no abdominal tenderness.  Musculoskeletal:        General: Normal range of motion.     Cervical back: Neck supple.     Right lower leg: No edema.     Left lower leg: No edema.  Lymphadenopathy:     Cervical: No cervical adenopathy.  Skin:    General: Skin is warm and dry.  Neurological:     Mental Status: She is alert. Mental status is at baseline.  Psychiatric:        Mood and Affect: Mood normal.       ASSESSMENT/ PLAN:  Chronic pain syndrome Chronic insomnia Chronic constipation   Will begin  mobic  7.5 mg twice daily Will begin miralax  daily    Barnie Seip NP [facility]  call [phone]     [1]  Allergies Allergen Reactions   Lyrica  [Pregabalin ]     Itching and insomnia   "

## 2024-09-04 ENCOUNTER — Other Ambulatory Visit: Payer: Self-pay | Admitting: Adult Health

## 2024-09-06 ENCOUNTER — Other Ambulatory Visit: Payer: Self-pay | Admitting: Adult Health

## 2024-09-06 MED ORDER — TRAMADOL HCL 50 MG PO TABS: 50.0000 mg | ORAL_TABLET | Freq: Four times a day (QID) | ORAL | 0 refills | Status: DC | PRN

## 2024-09-07 ENCOUNTER — Non-Acute Institutional Stay (SKILLED_NURSING_FACILITY): Payer: Self-pay | Admitting: Adult Health

## 2024-09-07 DIAGNOSIS — I110 Hypertensive heart disease with heart failure: Secondary | ICD-10-CM | POA: Diagnosis not present

## 2024-09-07 DIAGNOSIS — M5136 Other intervertebral disc degeneration, lumbar region with discogenic back pain only: Secondary | ICD-10-CM | POA: Diagnosis not present

## 2024-09-07 DIAGNOSIS — G894 Chronic pain syndrome: Secondary | ICD-10-CM | POA: Diagnosis not present

## 2024-09-07 NOTE — Progress Notes (Unsigned)
 " Location:  [facility] Room Number: 159 Place of Service:  SNF (31)   CODE STATUS: dnr   Allergies[1]  Chief Complaint  Patient presents with   Acute Visit    Care plan meeting     HPI:  We have come together for her care plan meeting. Family present. Using wheelchair without falls. BIMS 13/15 mood /30: not sleeping well; poor appetite; poor energy. She requires supervision to mod assist with her adl care. She is frequently incontinent of bladder and bowel. Dietary: setup for meals. Regular diet weight stable at 149 pounds. Appetite 51-100%. Therapy: she did decline care transfer due to being too cold outside. She is ambulating 75 feet with rolling walker contact guard assist; upper body setup lower body supervision. She still has pain which does keep her up at night.  She will continue to be followed for her chronic illnesses including: Chronic pain syndrome   Degeneration of intervertebral disc of lumbar region with discogenic back pain   Benign hypertension with coincident congestive heart failure    Past Medical History:  Diagnosis Date   Deaf    Essential hypertension    Fatty liver    Hyperlipidemia    Type 2 diabetes mellitus (HCC)     Past Surgical History:  Procedure Laterality Date   ABDOMINAL HYSTERECTOMY     APPENDECTOMY     At the time of hysterectomy   CESAREAN SECTION     X5   COLONOSCOPY     COLONOSCOPY N/A 06/11/2013   Colonic diverticulosis, hyperplastic polyps. no further screening colonoscopies recommended   ESOPHAGOGASTRODUODENOSCOPY N/A 02/20/2014   Procedure: ESOPHAGOGASTRODUODENOSCOPY (EGD);  Surgeon: Lamar CHRISTELLA Hollingshead, MD;  Location: [facility];  Service: Endoscopy;  Laterality: N/A;  11:00    Social History   Socioeconomic History   Marital status: Married    Spouse name: Not on file   Number of children: 5   Years of education: Not on file   Highest education level: Not on file  Occupational History    Employer:  RETIRED  Tobacco Use   Smoking status: Never    Passive exposure: Never   Smokeless tobacco: Never  Vaping Use   Vaping status: Never Used  Substance and Sexual Activity   Alcohol use: Yes    Alcohol/week: 3.0 standard drinks of alcohol    Types: 3 Glasses of wine per week    Comment: occasional   Drug use: No   Sexual activity: Not Currently  Other Topics Concern   Not on file  Social History Narrative   Not on file   Social Drivers of Health   Tobacco Use: Low Risk (08/22/2024)   Patient History    Smoking Tobacco Use: Never    Smokeless Tobacco Use: Never    Passive Exposure: Never  Financial Resource Strain: Low Risk (10/23/2023)   Received from [facility] [place]    Overall Financial Resource Strain (CARDIA)    Difficulty of Paying Living Expenses: Not hard at all  Food Insecurity: No Food Insecurity (08/13/2024)   Epic    Worried About Radiation Protection Practitioner of Food in the Last Year: Never true    Ran Out of Food in the Last Year: Never true  Transportation Needs: No Transportation Needs (08/13/2024)   Epic    Lack of Transportation (Medical): No    Lack of Transportation (Non-Medical): No  Physical Activity: Inactive (10/23/2023)   Received from [facility] [place]   Exercise Vital Sign    On average, how many days per week do you engage in moderate to strenuous exercise (like a brisk walk)?: 0 days    On average, how many minutes do you engage in exercise at this level?: 0 min  Stress: Stress Concern Present (10/23/2023)   Received from [facility] [place]    [employer] of Occupational Health - Occupational Stress Questionnaire    Feeling of Stress : To some extent  Social Connections: Moderately Isolated (08/13/2024)   Social Connection and Isolation Panel    Frequency of Communication with Friends and Family: More than three times a week    Frequency of Social Gatherings with Friends and Family: Never    Attends Religious  Services: Never    Database Administrator or Organizations: No    Attends Banker Meetings: Never    Marital Status: Married  Catering Manager Violence: Patient Unable To Answer (08/13/2024)   Epic    Fear of Current or Ex-Partner: Patient unable to answer    Emotionally Abused: Patient unable to answer    Physically Abused: Patient unable to answer    Sexually Abused: Patient unable to answer  Depression (PHQ2-9): High Risk (07/16/2024)   Depression (PHQ2-9)    PHQ-2 Score: 18  Alcohol Screen: Low Risk (05/20/2022)   Alcohol Screen    Last Alcohol Screening Score (AUDIT): 4  Housing: Low Risk (08/13/2024)   Epic    Unable to Pay for Housing in the Last Year: No    Number of Times Moved in the Last Year: 0    Homeless in the Last Year: No  Utilities: Not At Risk (08/13/2024)   Epic    Threatened with loss of utilities: No  Health Literacy: Not on file   Family History  Problem Relation Age of Onset   Cancer Mother    Heart Problems Father    Congestive Heart Failure Father    Cancer Daughter        breast    Hypertension Other    Hypertension Other    Colon cancer Neg Hx       VITAL SIGNS BP (!) 140/84   Pulse 88   Temp 97.9 F (36.6 C)   Resp 20   Ht 5' 6 (1.676 m)   Wt 149 lb 9.6 oz (67.9 kg)   SpO2 93%   BMI 24.15 kg/m   Outpatient Encounter Medications as of 09/07/2024  Medication Sig   acetaminophen  (TYLENOL ) 500 MG tablet Take 2 tablets (1,000 mg total) by mouth every 8 (eight) hours.   amLODipine  (NORVASC ) 2.5 MG tablet Take 1 tablet (2.5 mg total) by mouth daily.   furosemide  (LASIX ) 40 MG tablet Take 1 tablet (40 mg total) by mouth daily.   lidocaine  (LIDODERM ) 5 % Place 2 patches onto the skin daily. Remove & Discard patch within 12 hours or as directed by MD Place one patch on each knee   magnesium  oxide (MAG-OX) 400 (240 Mg) MG tablet Take 1 tablet (400 mg total) by mouth daily.   Melatonin 10 MG CAPS Take 2 capsules by mouth at bedtime.    meloxicam  (MOBIC ) 7.5 MG tablet Take 7.5 mg by mouth 2 (two) times daily.   polyethylene glycol powder (GLYCOLAX /MIRALAX ) 17 GM/SCOOP powder Take 17 g by mouth daily. Dissolve 1 capful (17g) in 4-8 ounces of liquid and take by mouth daily.   traMADol  (ULTRAM ) 50 MG tablet Take 1 tablet (50 mg total) by  mouth every 6 (six) hours as needed for moderate pain (pain score 4-6).   traZODone  (DESYREL ) 50 MG tablet Take 50 mg by mouth at bedtime.   No facility-administered encounter medications on file as of 09/07/2024.     SIGNIFICANT DIAGNOSTIC EXAMS  LABS  07-16-24: hgb A1c 5.6; chol 188; ldl 83 trig 83 ldl 90 08-13-24: wbc 11.2; hgb 13.5; hct 43.8 mcv 108.4 plt 288; glucose 150; bun 40; creat 0.95; k+ 5.5; na++ 136; ca 9.3 gfr 58; protein 7.2 albumin 4.3; BNP 36690.0 08-14-24: glucose 117; bun 40; creat 1.15; k+ 5.0; na++ 138; ca 8.9; gfr 46 08-15-24: tsh 1.470 vitamin B12: 3970.0 08-20-24: glucose 94; bun 16; creat 0.82; k+ 3.9; na++ 140; ca 9.0 gfr >60   Review of Systems  Constitutional:  Negative for malaise/fatigue.  Respiratory:  Negative for cough and shortness of breath.   Cardiovascular:  Negative for chest pain, palpitations and leg swelling.  Gastrointestinal:  Negative for abdominal pain, constipation and heartburn.  Musculoskeletal:  Positive for back pain. Negative for joint pain and myalgias.  Skin: Negative.   Neurological:  Negative for dizziness.  Psychiatric/Behavioral:  The patient is not nervous/anxious.     Physical Exam Constitutional:      General: She is not in acute distress.    Appearance: She is well-developed. She is not diaphoretic.  Neck:     Thyroid : No thyromegaly.  Cardiovascular:     Rate and Rhythm: Normal rate and regular rhythm.     Heart sounds: Normal heart sounds.  Pulmonary:     Effort: Pulmonary effort is normal. No respiratory distress.     Breath sounds: Normal breath sounds.  Abdominal:     General: Bowel sounds are normal. There is no  distension.     Palpations: Abdomen is soft.     Tenderness: There is no abdominal tenderness.  Musculoskeletal:        General: Normal range of motion.     Cervical back: Neck supple.     Right lower leg: No edema.     Left lower leg: No edema.  Lymphadenopathy:     Cervical: No cervical adenopathy.  Skin:    General: Skin is warm and dry.  Neurological:     Mental Status: She is alert and oriented to person, place, and time.  Psychiatric:        Mood and Affect: Mood normal.       ASSESSMENT/ PLAN:  TODAY  Chronic pain syndrome Degeneration of intervertebral disc of lumbar region with discogenic back pain Benign hypertension with coincident congestive heart failure   Will begin cymbalta 30 mg daily Will continue therapy as directed Will continue to monitor her status The goal of care is to return home  Time spent with patient 40 minutes: therapy; medications; pain management.    Barnie Seip NP [facility]  call [phone]      [1]  Allergies Allergen Reactions   Lyrica  [Pregabalin ]     Itching and insomnia   "

## 2024-09-11 ENCOUNTER — Other Ambulatory Visit: Payer: Self-pay | Admitting: Adult Health

## 2024-09-11 ENCOUNTER — Non-Acute Institutional Stay: Payer: Self-pay | Admitting: Adult Health

## 2024-09-11 DIAGNOSIS — F1120 Opioid dependence, uncomplicated: Secondary | ICD-10-CM

## 2024-09-11 DIAGNOSIS — I5031 Acute diastolic (congestive) heart failure: Secondary | ICD-10-CM

## 2024-09-11 DIAGNOSIS — I110 Hypertensive heart disease with heart failure: Secondary | ICD-10-CM

## 2024-09-11 DIAGNOSIS — J9601 Acute respiratory failure with hypoxia: Secondary | ICD-10-CM

## 2024-09-11 MED ORDER — MELOXICAM 7.5 MG PO TABS: 7.5000 mg | ORAL_TABLET | Freq: Two times a day (BID) | ORAL | 0 refills | Status: AC

## 2024-09-11 MED ORDER — FUROSEMIDE 40 MG PO TABS: 40.0000 mg | ORAL_TABLET | Freq: Every day | ORAL | 0 refills | Status: AC

## 2024-09-11 MED ORDER — LIDOCAINE 5 % EX PTCH: 2.0000 | MEDICATED_PATCH | CUTANEOUS | 0 refills | Status: AC

## 2024-09-11 MED ORDER — AMLODIPINE BESYLATE 2.5 MG PO TABS: 2.5000 mg | ORAL_TABLET | Freq: Every day | ORAL | 0 refills | Status: AC

## 2024-09-11 MED ORDER — MAGNESIUM OXIDE -MG SUPPLEMENT 400 (240 MG) MG PO TABS: 400.0000 mg | ORAL_TABLET | Freq: Every day | ORAL | 0 refills | Status: AC

## 2024-09-11 MED ORDER — TRAZODONE HCL 50 MG PO TABS: 50.0000 mg | ORAL_TABLET | Freq: Every day | ORAL | 0 refills | Status: AC

## 2024-09-11 MED ORDER — TRAMADOL HCL 50 MG PO TABS: 50.0000 mg | ORAL_TABLET | Freq: Four times a day (QID) | ORAL | 0 refills | Status: AC | PRN

## 2024-09-18 ENCOUNTER — Inpatient Hospital Stay: Admitting: Family Medicine

## 2024-10-24 ENCOUNTER — Ambulatory Visit: Admitting: Family Medicine
